# Patient Record
Sex: Female | Born: 1945
Health system: Southern US, Community
[De-identification: ages and names within clinical notes are randomized; demographics above are authoritative.]

## PROBLEM LIST (undated history)

## (undated) DIAGNOSIS — Z9221 Personal history of antineoplastic chemotherapy: Secondary | ICD-10-CM

## (undated) DIAGNOSIS — J849 Interstitial pulmonary disease, unspecified: Secondary | ICD-10-CM

## (undated) DIAGNOSIS — I219 Acute myocardial infarction, unspecified: Secondary | ICD-10-CM

## (undated) DIAGNOSIS — C50919 Malignant neoplasm of unspecified site of unspecified female breast: Secondary | ICD-10-CM

## (undated) DIAGNOSIS — G5600 Carpal tunnel syndrome, unspecified upper limb: Secondary | ICD-10-CM

## (undated) DIAGNOSIS — M069 Rheumatoid arthritis, unspecified: Secondary | ICD-10-CM

## (undated) DIAGNOSIS — F329 Major depressive disorder, single episode, unspecified: Secondary | ICD-10-CM

## (undated) DIAGNOSIS — F1090 Alcohol use, unspecified, uncomplicated: Secondary | ICD-10-CM

## (undated) DIAGNOSIS — M4800 Spinal stenosis, site unspecified: Secondary | ICD-10-CM

## (undated) DIAGNOSIS — Z923 Personal history of irradiation: Secondary | ICD-10-CM

## (undated) DIAGNOSIS — F32A Depression, unspecified: Secondary | ICD-10-CM

## (undated) DIAGNOSIS — H539 Unspecified visual disturbance: Secondary | ICD-10-CM

## (undated) DIAGNOSIS — F419 Anxiety disorder, unspecified: Secondary | ICD-10-CM

## (undated) DIAGNOSIS — I10 Essential (primary) hypertension: Secondary | ICD-10-CM

## (undated) DIAGNOSIS — Z7289 Other problems related to lifestyle: Secondary | ICD-10-CM

## (undated) HISTORY — DX: Acute myocardial infarction, unspecified: I21.9

## (undated) HISTORY — DX: Spinal stenosis, site unspecified: M48.00

## (undated) HISTORY — PX: CATARACT EXTRACTION: SUR2

## (undated) HISTORY — DX: Anxiety disorder, unspecified: F41.9

## (undated) HISTORY — DX: Interstitial pulmonary disease, unspecified: J84.9

## (undated) HISTORY — DX: Unspecified visual disturbance: H53.9

## (undated) HISTORY — DX: Essential (primary) hypertension: I10

## (undated) HISTORY — PX: CARPAL TUNNEL RELEASE: SHX101

## (undated) HISTORY — DX: Carpal tunnel syndrome, unspecified upper limb: G56.00

## (undated) HISTORY — PX: BREAST BIOPSY: SHX20

---

## 1964-11-18 HISTORY — PX: LAMINECTOMY: SHX219

## 1997-12-22 ENCOUNTER — Ambulatory Visit (HOSPITAL_COMMUNITY): Admission: RE | Admit: 1997-12-22 | Discharge: 1997-12-22 | Payer: Self-pay | Admitting: Family Medicine

## 1998-03-03 ENCOUNTER — Other Ambulatory Visit: Admission: RE | Admit: 1998-03-03 | Discharge: 1998-03-03 | Payer: Self-pay | Admitting: Family Medicine

## 1999-01-24 ENCOUNTER — Encounter: Payer: Self-pay | Admitting: Family Medicine

## 1999-01-24 ENCOUNTER — Ambulatory Visit (HOSPITAL_COMMUNITY): Admission: RE | Admit: 1999-01-24 | Discharge: 1999-01-24 | Payer: Self-pay | Admitting: Family Medicine

## 1999-04-02 ENCOUNTER — Other Ambulatory Visit: Admission: RE | Admit: 1999-04-02 | Discharge: 1999-04-02 | Payer: Self-pay | Admitting: Family Medicine

## 2000-04-23 ENCOUNTER — Ambulatory Visit (HOSPITAL_COMMUNITY): Admission: RE | Admit: 2000-04-23 | Discharge: 2000-04-23 | Payer: Self-pay | Admitting: Family Medicine

## 2000-04-23 ENCOUNTER — Encounter: Payer: Self-pay | Admitting: Family Medicine

## 2001-05-14 ENCOUNTER — Encounter: Payer: Self-pay | Admitting: Family Medicine

## 2001-05-14 ENCOUNTER — Ambulatory Visit (HOSPITAL_COMMUNITY): Admission: RE | Admit: 2001-05-14 | Discharge: 2001-05-14 | Payer: Self-pay | Admitting: Family Medicine

## 2001-05-28 ENCOUNTER — Encounter: Payer: Self-pay | Admitting: Family Medicine

## 2001-05-28 ENCOUNTER — Encounter: Admission: RE | Admit: 2001-05-28 | Discharge: 2001-05-28 | Payer: Self-pay | Admitting: Family Medicine

## 2002-06-01 ENCOUNTER — Encounter: Admission: RE | Admit: 2002-06-01 | Discharge: 2002-06-01 | Payer: Self-pay | Admitting: Family Medicine

## 2002-06-01 ENCOUNTER — Encounter: Payer: Self-pay | Admitting: Family Medicine

## 2002-11-05 ENCOUNTER — Ambulatory Visit (HOSPITAL_COMMUNITY): Admission: RE | Admit: 2002-11-05 | Discharge: 2002-11-05 | Payer: Self-pay | Admitting: Gastroenterology

## 2003-06-29 ENCOUNTER — Encounter: Admission: RE | Admit: 2003-06-29 | Discharge: 2003-06-29 | Payer: Self-pay | Admitting: Family Medicine

## 2003-06-29 ENCOUNTER — Encounter: Payer: Self-pay | Admitting: Family Medicine

## 2003-11-19 HISTORY — PX: OTHER SURGICAL HISTORY: SHX169

## 2003-11-19 HISTORY — PX: BREAST LUMPECTOMY: SHX2

## 2004-07-26 ENCOUNTER — Other Ambulatory Visit: Admission: RE | Admit: 2004-07-26 | Discharge: 2004-07-26 | Payer: Self-pay | Admitting: Family Medicine

## 2004-08-09 ENCOUNTER — Encounter: Admission: RE | Admit: 2004-08-09 | Discharge: 2004-08-09 | Payer: Self-pay | Admitting: Family Medicine

## 2004-08-30 ENCOUNTER — Encounter: Admission: RE | Admit: 2004-08-30 | Discharge: 2004-08-30 | Payer: Self-pay | Admitting: Family Medicine

## 2004-09-06 ENCOUNTER — Encounter (INDEPENDENT_AMBULATORY_CARE_PROVIDER_SITE_OTHER): Payer: Self-pay | Admitting: Radiology

## 2004-09-06 ENCOUNTER — Encounter: Admission: RE | Admit: 2004-09-06 | Discharge: 2004-09-06 | Payer: Self-pay | Admitting: Family Medicine

## 2004-09-06 ENCOUNTER — Encounter (INDEPENDENT_AMBULATORY_CARE_PROVIDER_SITE_OTHER): Payer: Self-pay | Admitting: Specialist

## 2004-09-14 ENCOUNTER — Encounter: Admission: RE | Admit: 2004-09-14 | Discharge: 2004-09-14 | Payer: Self-pay | Admitting: General Surgery

## 2004-09-20 ENCOUNTER — Encounter: Admission: RE | Admit: 2004-09-20 | Discharge: 2004-09-20 | Payer: Self-pay | Admitting: General Surgery

## 2004-09-20 ENCOUNTER — Encounter (INDEPENDENT_AMBULATORY_CARE_PROVIDER_SITE_OTHER): Payer: Self-pay | Admitting: Specialist

## 2004-10-01 ENCOUNTER — Encounter (INDEPENDENT_AMBULATORY_CARE_PROVIDER_SITE_OTHER): Payer: Self-pay | Admitting: *Deleted

## 2004-10-01 ENCOUNTER — Ambulatory Visit (HOSPITAL_COMMUNITY): Admission: RE | Admit: 2004-10-01 | Discharge: 2004-10-01 | Payer: Self-pay | Admitting: General Surgery

## 2004-10-01 ENCOUNTER — Encounter: Admission: RE | Admit: 2004-10-01 | Discharge: 2004-10-01 | Payer: Self-pay | Admitting: General Surgery

## 2004-10-01 ENCOUNTER — Ambulatory Visit (HOSPITAL_BASED_OUTPATIENT_CLINIC_OR_DEPARTMENT_OTHER): Admission: RE | Admit: 2004-10-01 | Discharge: 2004-10-01 | Payer: Self-pay | Admitting: General Surgery

## 2004-10-05 ENCOUNTER — Ambulatory Visit: Payer: Self-pay | Admitting: Oncology

## 2004-10-10 ENCOUNTER — Ambulatory Visit (HOSPITAL_COMMUNITY): Admission: RE | Admit: 2004-10-10 | Discharge: 2004-10-10 | Payer: Self-pay | Admitting: Oncology

## 2004-10-15 ENCOUNTER — Ambulatory Visit (HOSPITAL_COMMUNITY): Admission: RE | Admit: 2004-10-15 | Discharge: 2004-10-15 | Payer: Self-pay | Admitting: Oncology

## 2004-10-16 ENCOUNTER — Ambulatory Visit: Payer: Self-pay

## 2004-10-18 ENCOUNTER — Ambulatory Visit (HOSPITAL_COMMUNITY): Admission: RE | Admit: 2004-10-18 | Discharge: 2004-10-18 | Payer: Self-pay | Admitting: General Surgery

## 2004-10-22 ENCOUNTER — Ambulatory Visit (HOSPITAL_COMMUNITY): Admission: RE | Admit: 2004-10-22 | Discharge: 2004-10-22 | Payer: Self-pay | Admitting: Oncology

## 2004-11-16 ENCOUNTER — Inpatient Hospital Stay (HOSPITAL_COMMUNITY): Admission: EM | Admit: 2004-11-16 | Discharge: 2004-11-18 | Payer: Self-pay | Admitting: Oncology

## 2004-11-16 ENCOUNTER — Ambulatory Visit: Payer: Self-pay | Admitting: Oncology

## 2004-11-28 ENCOUNTER — Ambulatory Visit: Payer: Self-pay | Admitting: Oncology

## 2004-12-21 ENCOUNTER — Encounter: Payer: Self-pay | Admitting: Cardiology

## 2004-12-21 ENCOUNTER — Ambulatory Visit: Admission: RE | Admit: 2004-12-21 | Discharge: 2004-12-21 | Payer: Self-pay | Admitting: Oncology

## 2004-12-21 ENCOUNTER — Ambulatory Visit: Payer: Self-pay | Admitting: Cardiology

## 2005-01-14 ENCOUNTER — Ambulatory Visit: Payer: Self-pay | Admitting: Oncology

## 2005-01-15 ENCOUNTER — Ambulatory Visit: Payer: Self-pay | Admitting: Oncology

## 2005-03-05 ENCOUNTER — Ambulatory Visit: Payer: Self-pay | Admitting: Oncology

## 2005-03-05 ENCOUNTER — Ambulatory Visit: Admission: RE | Admit: 2005-03-05 | Discharge: 2005-04-05 | Payer: Self-pay | Admitting: Radiation Oncology

## 2005-03-21 ENCOUNTER — Encounter: Admission: RE | Admit: 2005-03-21 | Discharge: 2005-03-21 | Payer: Self-pay | Admitting: General Surgery

## 2005-04-04 ENCOUNTER — Encounter (INDEPENDENT_AMBULATORY_CARE_PROVIDER_SITE_OTHER): Payer: Self-pay | Admitting: *Deleted

## 2005-04-04 ENCOUNTER — Ambulatory Visit: Admission: RE | Admit: 2005-04-04 | Discharge: 2005-04-04 | Payer: Self-pay | Admitting: Oncology

## 2005-04-08 ENCOUNTER — Ambulatory Visit: Admission: RE | Admit: 2005-04-08 | Discharge: 2005-05-31 | Payer: Self-pay | Admitting: Radiation Oncology

## 2005-05-20 ENCOUNTER — Ambulatory Visit: Payer: Self-pay | Admitting: Oncology

## 2005-07-03 ENCOUNTER — Encounter: Payer: Self-pay | Admitting: Cardiology

## 2005-07-03 ENCOUNTER — Ambulatory Visit (HOSPITAL_COMMUNITY): Admission: RE | Admit: 2005-07-03 | Discharge: 2005-07-03 | Payer: Self-pay | Admitting: Oncology

## 2005-07-04 ENCOUNTER — Ambulatory Visit: Payer: Self-pay | Admitting: Cardiology

## 2005-07-08 ENCOUNTER — Ambulatory Visit: Payer: Self-pay | Admitting: Oncology

## 2005-07-09 ENCOUNTER — Encounter (INDEPENDENT_AMBULATORY_CARE_PROVIDER_SITE_OTHER): Payer: Self-pay | Admitting: Specialist

## 2005-07-09 ENCOUNTER — Encounter: Admission: RE | Admit: 2005-07-09 | Discharge: 2005-07-09 | Payer: Self-pay | Admitting: General Surgery

## 2005-09-04 ENCOUNTER — Ambulatory Visit: Payer: Self-pay | Admitting: Cardiovascular Disease

## 2005-09-04 ENCOUNTER — Encounter: Payer: Self-pay | Admitting: Cardiovascular Disease

## 2005-09-04 ENCOUNTER — Ambulatory Visit: Admission: RE | Admit: 2005-09-04 | Discharge: 2005-09-04 | Payer: Self-pay | Admitting: Oncology

## 2005-09-12 ENCOUNTER — Ambulatory Visit: Payer: Self-pay | Admitting: Oncology

## 2005-10-28 ENCOUNTER — Ambulatory Visit: Payer: Self-pay | Admitting: Oncology

## 2005-11-08 ENCOUNTER — Other Ambulatory Visit: Admission: RE | Admit: 2005-11-08 | Discharge: 2005-11-08 | Payer: Self-pay | Admitting: Family Medicine

## 2005-11-13 ENCOUNTER — Ambulatory Visit: Payer: Self-pay | Admitting: Cardiology

## 2005-11-13 ENCOUNTER — Ambulatory Visit: Admission: RE | Admit: 2005-11-13 | Discharge: 2005-11-13 | Payer: Self-pay | Admitting: Oncology

## 2005-11-13 ENCOUNTER — Encounter: Payer: Self-pay | Admitting: Cardiology

## 2005-12-30 ENCOUNTER — Ambulatory Visit: Payer: Self-pay | Admitting: Oncology

## 2006-02-25 ENCOUNTER — Encounter: Admission: RE | Admit: 2006-02-25 | Discharge: 2006-02-25 | Payer: Self-pay | Admitting: General Surgery

## 2006-02-26 ENCOUNTER — Ambulatory Visit: Admission: RE | Admit: 2006-02-26 | Discharge: 2006-02-26 | Payer: Self-pay | Admitting: Oncology

## 2006-02-26 ENCOUNTER — Encounter: Payer: Self-pay | Admitting: Cardiology

## 2006-02-26 ENCOUNTER — Ambulatory Visit: Payer: Self-pay | Admitting: Cardiology

## 2006-03-03 ENCOUNTER — Ambulatory Visit: Payer: Self-pay | Admitting: Oncology

## 2006-03-04 LAB — COMPREHENSIVE METABOLIC PANEL
ALT: 19 U/L (ref 0–40)
AST: 24 U/L (ref 0–37)
CO2: 27 mEq/L (ref 19–32)
Calcium: 8.9 mg/dL (ref 8.4–10.5)
Chloride: 103 mEq/L (ref 96–112)
Creatinine, Ser: 0.7 mg/dL (ref 0.4–1.2)
Sodium: 135 mEq/L (ref 135–145)
Total Protein: 6.9 g/dL (ref 6.0–8.3)

## 2006-03-04 LAB — CBC WITH DIFFERENTIAL/PLATELET
BASO%: 1.6 % (ref 0.0–2.0)
EOS%: 9 % — ABNORMAL HIGH (ref 0.0–7.0)
HCT: 39.5 % (ref 34.8–46.6)
MCH: 32 pg (ref 26.0–34.0)
MCHC: 36.2 g/dL — ABNORMAL HIGH (ref 32.0–36.0)
MONO#: 0.5 10*3/uL (ref 0.1–0.9)
NEUT%: 51 % (ref 39.6–76.8)
RBC: 4.47 10*6/uL (ref 3.70–5.32)
RDW: 11.8 % (ref 11.3–14.5)
WBC: 5.1 10*3/uL (ref 3.9–10.0)
lymph#: 1.4 10*3/uL (ref 0.9–3.3)

## 2006-04-15 ENCOUNTER — Ambulatory Visit: Payer: Self-pay | Admitting: Oncology

## 2006-05-20 ENCOUNTER — Ambulatory Visit: Payer: Self-pay | Admitting: Cardiology

## 2006-05-20 ENCOUNTER — Encounter: Payer: Self-pay | Admitting: Cardiology

## 2006-05-20 ENCOUNTER — Ambulatory Visit: Admission: RE | Admit: 2006-05-20 | Discharge: 2006-05-20 | Payer: Self-pay | Admitting: Oncology

## 2006-05-27 LAB — CBC WITH DIFFERENTIAL/PLATELET
BASO%: 0.6 % (ref 0.0–2.0)
Basophils Absolute: 0 10*3/uL (ref 0.0–0.1)
Eosinophils Absolute: 0.4 10*3/uL (ref 0.0–0.5)
HCT: 39.8 % (ref 34.8–46.6)
HGB: 13.9 g/dL (ref 11.6–15.9)
LYMPH%: 24.6 % (ref 14.0–48.0)
MCHC: 35 g/dL (ref 32.0–36.0)
MONO#: 0.3 10*3/uL (ref 0.1–0.9)
NEUT#: 2.6 10*3/uL (ref 1.5–6.5)
NEUT%: 58.1 % (ref 39.6–76.8)
Platelets: 224 10*3/uL (ref 145–400)
WBC: 4.5 10*3/uL (ref 3.9–10.0)
lymph#: 1.1 10*3/uL (ref 0.9–3.3)

## 2006-05-27 LAB — COMPREHENSIVE METABOLIC PANEL
ALT: 20 U/L (ref 0–40)
BUN: 13 mg/dL (ref 6–23)
CO2: 28 mEq/L (ref 19–32)
Calcium: 8.9 mg/dL (ref 8.4–10.5)
Chloride: 103 mEq/L (ref 96–112)
Creatinine, Ser: 0.7 mg/dL (ref 0.40–1.20)
Glucose, Bld: 118 mg/dL — ABNORMAL HIGH (ref 70–99)
Total Bilirubin: 0.4 mg/dL (ref 0.3–1.2)

## 2006-05-27 LAB — CANCER ANTIGEN 27.29: CA 27.29: 22 U/mL (ref 0–39)

## 2006-07-04 ENCOUNTER — Ambulatory Visit (HOSPITAL_BASED_OUTPATIENT_CLINIC_OR_DEPARTMENT_OTHER): Admission: RE | Admit: 2006-07-04 | Discharge: 2006-07-04 | Payer: Self-pay | Admitting: General Surgery

## 2006-07-29 ENCOUNTER — Encounter: Admission: RE | Admit: 2006-07-29 | Discharge: 2006-07-29 | Payer: Self-pay | Admitting: General Surgery

## 2006-07-29 ENCOUNTER — Encounter: Admission: RE | Admit: 2006-07-29 | Discharge: 2006-07-29 | Payer: Self-pay | Admitting: Obstetrics and Gynecology

## 2006-09-05 ENCOUNTER — Ambulatory Visit: Payer: Self-pay | Admitting: Oncology

## 2006-09-09 LAB — CBC WITH DIFFERENTIAL/PLATELET
BASO%: 0.6 % (ref 0.0–2.0)
Eosinophils Absolute: 0.6 10*3/uL — ABNORMAL HIGH (ref 0.0–0.5)
HCT: 40.2 % (ref 34.8–46.6)
LYMPH%: 20.4 % (ref 14.0–48.0)
MCHC: 35.1 g/dL (ref 32.0–36.0)
MCV: 90.7 fL (ref 81.0–101.0)
MONO#: 0.4 10*3/uL (ref 0.1–0.9)
MONO%: 6.8 % (ref 0.0–13.0)
NEUT%: 62.2 % (ref 39.6–76.8)
Platelets: 242 10*3/uL (ref 145–400)
RBC: 4.43 10*6/uL (ref 3.70–5.32)

## 2006-09-09 LAB — COMPREHENSIVE METABOLIC PANEL
ALT: 21 U/L (ref 0–40)
BUN: 16 mg/dL (ref 6–23)
CO2: 30 mEq/L (ref 19–32)
Calcium: 9.4 mg/dL (ref 8.4–10.5)
Chloride: 100 mEq/L (ref 96–112)
Creatinine, Ser: 0.93 mg/dL (ref 0.40–1.20)
Total Bilirubin: 0.4 mg/dL (ref 0.3–1.2)

## 2006-09-09 LAB — CANCER ANTIGEN 27.29: CA 27.29: 16 U/mL (ref 0–39)

## 2006-09-10 ENCOUNTER — Ambulatory Visit (HOSPITAL_COMMUNITY): Admission: RE | Admit: 2006-09-10 | Discharge: 2006-09-10 | Payer: Self-pay | Admitting: Oncology

## 2007-01-23 ENCOUNTER — Inpatient Hospital Stay (HOSPITAL_COMMUNITY): Admission: EM | Admit: 2007-01-23 | Discharge: 2007-01-27 | Payer: Self-pay | Admitting: Emergency Medicine

## 2007-05-06 ENCOUNTER — Other Ambulatory Visit: Admission: RE | Admit: 2007-05-06 | Discharge: 2007-05-06 | Payer: Self-pay | Admitting: Family Medicine

## 2007-07-17 ENCOUNTER — Encounter: Admission: RE | Admit: 2007-07-17 | Discharge: 2007-07-17 | Payer: Self-pay | Admitting: Family Medicine

## 2007-07-31 ENCOUNTER — Encounter: Admission: RE | Admit: 2007-07-31 | Discharge: 2007-07-31 | Payer: Self-pay | Admitting: Oncology

## 2007-09-18 ENCOUNTER — Ambulatory Visit: Payer: Self-pay | Admitting: Oncology

## 2007-09-22 LAB — CBC WITH DIFFERENTIAL/PLATELET
BASO%: 0.4 % (ref 0.0–2.0)
Basophils Absolute: 0 10*3/uL (ref 0.0–0.1)
EOS%: 13.1 % — ABNORMAL HIGH (ref 0.0–7.0)
HCT: 40.3 % (ref 34.8–46.6)
HGB: 14.3 g/dL (ref 11.6–15.9)
MCH: 31.7 pg (ref 26.0–34.0)
MCHC: 35.6 g/dL (ref 32.0–36.0)
MCV: 89 fL (ref 81.0–101.0)
MONO%: 7.6 % (ref 0.0–13.0)
NEUT%: 60 % (ref 39.6–76.8)

## 2007-09-22 LAB — COMPREHENSIVE METABOLIC PANEL
AST: 19 U/L (ref 0–37)
Alkaline Phosphatase: 61 U/L (ref 39–117)
BUN: 18 mg/dL (ref 6–23)
Creatinine, Ser: 0.73 mg/dL (ref 0.40–1.20)
Total Bilirubin: 0.6 mg/dL (ref 0.3–1.2)

## 2008-02-01 ENCOUNTER — Encounter: Admission: RE | Admit: 2008-02-01 | Discharge: 2008-02-01 | Payer: Self-pay | Admitting: Oncology

## 2008-03-08 ENCOUNTER — Encounter: Admission: RE | Admit: 2008-03-08 | Discharge: 2008-03-08 | Payer: Self-pay | Admitting: Family Medicine

## 2008-05-06 ENCOUNTER — Other Ambulatory Visit: Admission: RE | Admit: 2008-05-06 | Discharge: 2008-05-06 | Payer: Self-pay | Admitting: Family Medicine

## 2008-08-22 ENCOUNTER — Encounter: Admission: RE | Admit: 2008-08-22 | Discharge: 2008-08-22 | Payer: Self-pay | Admitting: General Surgery

## 2008-09-01 ENCOUNTER — Encounter: Admission: RE | Admit: 2008-09-01 | Discharge: 2008-09-01 | Payer: Self-pay | Admitting: General Surgery

## 2008-09-19 ENCOUNTER — Ambulatory Visit: Payer: Self-pay | Admitting: Oncology

## 2008-09-21 LAB — CBC WITH DIFFERENTIAL/PLATELET
Basophils Absolute: 0 10*3/uL (ref 0.0–0.1)
Eosinophils Absolute: 0.4 10*3/uL (ref 0.0–0.5)
HGB: 14.4 g/dL (ref 11.6–15.9)
LYMPH%: 19.2 % (ref 14.0–48.0)
MCV: 90.1 fL (ref 81.0–101.0)
MONO%: 7.9 % (ref 0.0–13.0)
NEUT#: 4.5 10*3/uL (ref 1.5–6.5)
Platelets: 238 10*3/uL (ref 145–400)

## 2008-09-21 LAB — COMPREHENSIVE METABOLIC PANEL
Alkaline Phosphatase: 63 U/L (ref 39–117)
BUN: 16 mg/dL (ref 6–23)
Glucose, Bld: 117 mg/dL — ABNORMAL HIGH (ref 70–99)
Total Bilirubin: 0.5 mg/dL (ref 0.3–1.2)

## 2009-05-08 ENCOUNTER — Other Ambulatory Visit: Admission: RE | Admit: 2009-05-08 | Discharge: 2009-05-08 | Payer: Self-pay | Admitting: Family Medicine

## 2009-08-29 ENCOUNTER — Encounter: Admission: RE | Admit: 2009-08-29 | Discharge: 2009-08-29 | Payer: Self-pay | Admitting: Oncology

## 2009-09-11 ENCOUNTER — Ambulatory Visit: Payer: Self-pay | Admitting: Oncology

## 2009-09-13 LAB — CBC WITH DIFFERENTIAL/PLATELET
BASO%: 0.7 % (ref 0.0–2.0)
Basophils Absolute: 0 10*3/uL (ref 0.0–0.1)
EOS%: 4.5 % (ref 0.0–7.0)
HGB: 14.1 g/dL (ref 11.6–15.9)
MCH: 31.4 pg (ref 25.1–34.0)
MCHC: 34.4 g/dL (ref 31.5–36.0)
MCV: 91.2 fL (ref 79.5–101.0)
MONO%: 6.9 % (ref 0.0–14.0)
RDW: 13.1 % (ref 11.2–14.5)

## 2009-09-13 LAB — COMPREHENSIVE METABOLIC PANEL
AST: 19 U/L (ref 0–37)
Albumin: 4.1 g/dL (ref 3.5–5.2)
Alkaline Phosphatase: 59 U/L (ref 39–117)
BUN: 19 mg/dL (ref 6–23)
Creatinine, Ser: 0.75 mg/dL (ref 0.40–1.20)
Potassium: 3.9 mEq/L (ref 3.5–5.3)

## 2010-01-29 ENCOUNTER — Encounter: Admission: RE | Admit: 2010-01-29 | Discharge: 2010-01-29 | Payer: Self-pay | Admitting: Family Medicine

## 2010-02-15 DIAGNOSIS — E559 Vitamin D deficiency, unspecified: Secondary | ICD-10-CM

## 2010-02-15 DIAGNOSIS — J45901 Unspecified asthma with (acute) exacerbation: Secondary | ICD-10-CM | POA: Insufficient documentation

## 2010-02-15 DIAGNOSIS — J45909 Unspecified asthma, uncomplicated: Secondary | ICD-10-CM | POA: Insufficient documentation

## 2010-02-15 DIAGNOSIS — F411 Generalized anxiety disorder: Secondary | ICD-10-CM | POA: Insufficient documentation

## 2010-02-15 DIAGNOSIS — E7800 Pure hypercholesterolemia, unspecified: Secondary | ICD-10-CM | POA: Insufficient documentation

## 2010-02-15 DIAGNOSIS — Z9010 Acquired absence of unspecified breast and nipple: Secondary | ICD-10-CM

## 2010-02-15 DIAGNOSIS — Z87898 Personal history of other specified conditions: Secondary | ICD-10-CM | POA: Insufficient documentation

## 2010-02-16 ENCOUNTER — Ambulatory Visit: Payer: Self-pay | Admitting: Internal Medicine

## 2010-02-16 DIAGNOSIS — Z853 Personal history of malignant neoplasm of breast: Secondary | ICD-10-CM

## 2010-02-22 ENCOUNTER — Telehealth (INDEPENDENT_AMBULATORY_CARE_PROVIDER_SITE_OTHER): Payer: Self-pay | Admitting: *Deleted

## 2010-03-28 ENCOUNTER — Ambulatory Visit: Payer: Self-pay | Admitting: Internal Medicine

## 2010-04-02 ENCOUNTER — Ambulatory Visit: Payer: Self-pay | Admitting: Internal Medicine

## 2010-05-18 ENCOUNTER — Ambulatory Visit: Payer: Self-pay | Admitting: Internal Medicine

## 2010-06-01 ENCOUNTER — Telehealth (INDEPENDENT_AMBULATORY_CARE_PROVIDER_SITE_OTHER): Payer: Self-pay | Admitting: *Deleted

## 2010-06-29 ENCOUNTER — Encounter: Admission: RE | Admit: 2010-06-29 | Discharge: 2010-06-29 | Payer: Self-pay | Admitting: Family Medicine

## 2010-08-29 ENCOUNTER — Ambulatory Visit: Payer: Self-pay | Admitting: Internal Medicine

## 2010-08-31 ENCOUNTER — Encounter: Admission: RE | Admit: 2010-08-31 | Discharge: 2010-08-31 | Payer: Self-pay | Admitting: General Surgery

## 2010-09-17 ENCOUNTER — Ambulatory Visit: Payer: Self-pay | Admitting: Oncology

## 2010-09-19 LAB — COMPREHENSIVE METABOLIC PANEL
ALT: 19 U/L (ref 0–35)
AST: 28 U/L (ref 0–37)
Albumin: 3.5 g/dL (ref 3.5–5.2)
Alkaline Phosphatase: 62 U/L (ref 39–117)
CO2: 29 mEq/L (ref 19–32)
Calcium: 8.8 mg/dL (ref 8.4–10.5)
Potassium: 4 mEq/L (ref 3.5–5.3)
Total Protein: 6.6 g/dL (ref 6.0–8.3)

## 2010-09-19 LAB — CBC WITH DIFFERENTIAL/PLATELET
EOS%: 9.5 % — ABNORMAL HIGH (ref 0.0–7.0)
MONO#: 0.5 10*3/uL (ref 0.1–0.9)
MONO%: 9.3 % (ref 0.0–14.0)
NEUT#: 2.9 10*3/uL (ref 1.5–6.5)
NEUT%: 57.7 % (ref 38.4–76.8)
RBC: 4.32 10*6/uL (ref 3.70–5.45)

## 2010-12-08 ENCOUNTER — Encounter: Payer: Self-pay | Admitting: Oncology

## 2010-12-18 NOTE — Assessment & Plan Note (Signed)
Summary: Pulmonary/ ext eval with pft review, better off ace   Copy to:  Dr. Shaune Pollack Primary Provider/Referring Provider:  Dr Shaune Pollack  CC:  Pt here for follow up visit. Pt states breathing is better since las OV. Pt c/o dry hacky cough starting this AM. Pt states she started back on Lotensin 3 days ago.  Marland Kitchen  History of Present Illness: 65 yowf quit smoking in 1995 with off and on wheezing x last sev years of smoking much better after quit but then required maintenance inhalers of some sort since around 2000.  February 16, 2010 cc did well until March  on symbicort maint now cc cough with congestion with slt yellow mucus with congestion in top of lungs worse after stirring and doesn't now disturb sleep. doe not back to baseline = exertion, not adls. No better with inhalers or singulair or even prednisone and sev different abx.   Apr 02, 2010 Pt here for follow up visit. Pt states breathing is better since las OV. Pt c/o dry hacky cough starting this AM. Pt states she started back on Lotensin 3 days ago.  Pt denies any significant sore throat, dysphagia, itching, sneezing,  nasal congestion or excess secretions,  fever, chills, sweats, unintended wt loss, pleuritic or exertional cp, hempoptysis, change in activity tolerance  orthopnea pnd or leg swelling Pt also denies any obvious fluctuation in symptoms with weather or environmental change or other alleviating or aggravating factors.       Current Medications (verified): 1)  Celexa 40 Mg Tabs (Citalopram Hydrobromide) .Marland Kitchen.. 1 Once Daily 2)  Allegra 180 Mg Tabs (Fexofenadine Hcl) .Marland Kitchen.. 1 Once Daily 3)  Prilosec 20 Mg Cpdr (Omeprazole) .... Take One 30-60 Min Before First and Last Meals of The Day 4)  Symbicort 160-4.5 Mcg/act Aero (Budesonide-Formoterol Fumarate) .... 2 Puffs Two Times A Day 5)  Clonazepam 0.5 Mg Tabs (Clonazepam) .Marland Kitchen.. 1 Once Daily As Needed 6)  Baby Aspirin 81 Mg Chew (Aspirin) .Marland Kitchen.. 1 Once Daily 7)  Calcium-Vitamin D  600-200 Mg-Unit Tabs (Calcium-Vitamin D) .Marland Kitchen.. 1 Two Times A Day 8)  Glucosamine 500 Mg Caps (Glucosamine Sulfate) .... 2 Once Daily 9)  Lotensin 10 Mg Tabs (Benazepril Hcl) .... Take 1 Tablet By Mouth Once A Day  Allergies (verified): No Known Drug Allergies  Past History:  Past Medical History: Hx of BREAST CANCER (ICD-174.9) MIGRAINES, HX OF (ICD-V13.8) MASTECTOMY, LEFT, HX OF (ICD-V45.71) ASTHMA (ICD-493.90)     - PFT's wnl Apr 02, 2010  VITAMIN D DEFICIENCY (ICD-268.9) ANXIETY (ICD-300.00) HYPERTENSION, BENIGN ESSENTIAL (ICD-401.1)     - Off ace February 16, 2010 due to ? pseudoasthma > cough recurred on lotensin Apr 02, 2010  HYPERCHOLESTEROLEMIA (ICD-272.0)  Vital Signs:  Patient profile:   65 year old female Height:      64 inches Weight:      143 pounds O2 Sat:      97 % on Room air Temp:     98.2 degrees F oral Pulse rate:   72 / minute BP sitting:   110 / 64  (right arm) Cuff size:   regular  Vitals Entered By: Zackery Barefoot CMA (Apr 02, 2010 10:07 AM)  O2 Flow:  Room air CC: Pt here for follow up visit. Pt states breathing is better since las OV. Pt c/o dry hacky cough starting this AM. Pt states she started back on Lotensin 3 days ago.   Comments Medications reviewed with patient Verified contact number and  pharmacy with patient Zackery Barefoot CMA  Apr 02, 2010 10:08 AM    Physical Exam  Additional Exam:  amb wf nad wt 144 February 17, 2010 > 143 Apr 02, 2010  HEENT mild turbinate edema.  Oropharynx no thrush or excess pnd or cobblestoning.  No JVD or cervical adenopathy. Mild accessory muscle hypertrophy. Trachea midline, nl thryroid. Chest was hyperinflated by percussion with diminished breath sounds and moderate increased exp time without wheeze. Hoover sign positive at mid inspiration. Regular rate and rhythm without murmur gallop or rub or increase P2 or edema.  Abd: no hsm, nl excursion. Ext warm without cyanosis or clubbing.     Impression &  Recommendations:  Problem # 1:  ASTHMA (ICD-493.90) All goals of asthma met including optimal function and elimination of symptoms with minimum need for rescue therapy. Contingencies discussed today including the rule of two's.   May be able to simply/ reduce meds if can eliminate all the upper airway features  Problem # 2:  HYPERTENSION, BENIGN ESSENTIAL (ICD-401.1)  The following medications were removed from the medication list:    Lotensin 10 Mg Tabs (Benazepril hcl) .Marland Kitchen... Take 1 tablet by mouth once a day Her updated medication list for this problem includes:    Micardis 40 Mg Tabs (Telmisartan) ..... One half daily    ACE inhibitors are problematic in  pts with airway complaints because  even experienced pulmonologists can't always distinguish ace effects from copd/asthma.  By themselves they don't actually cause a problem, much like oxygen can't by itself start a fire, but they certainly serve as a powerful catalyst or enhancer for any "fire"  or inflammatory process in the upper airway, be it caused by an ET  tube or more commonly reflux (especially in the obese or pts with known GERD or who are on biphoshonates).  In the era of ARB near equivalency until we have a better handle on the reversibility of the airway problem, it just makes sense to avoid ace entirely in the short run and then decide later, having established a level of airway control using a reasonable limited regimen, whether to add back ace but even then being very careful to observe the pt for worsening airway control and number of meds used/ needed to control symptoms.    Each maintenance medication was reviewed in detail including most importantly the difference between maintenance prns and under what circumstances the prns are to be used.  In addition, these two groups (for which the patient should keep up with refills) were distinguished from a third group :  meds that are used only short term with the intent to complete  a course of therapy and then not refill them.  The med list was then fully reconciled and reorganized to reflect this important distinction.   Medications Added to Medication List This Visit: 1)  Prilosec 20 Mg Cpdr (Omeprazole) .... Take  one 30-60 min before first meal of the day 2)  Symbicort 160-4.5 Mcg/act Aero (Budesonide-formoterol fumarate) .... 2 puffs first thing  in am and 2 puffs again in pm about 12 hours later 3)  Micardis 40 Mg Tabs (Telmisartan) .... One half daily 4)  Allegra 180 Mg Tabs (Fexofenadine hcl) .Marland Kitchen.. 1 once daily as needed  for itching sneezing runny nose 5)  Lotensin 10 Mg Tabs (Benazepril hcl) .... Take 1 tablet by mouth once a day  Patient Instructions: 1)  Think of your medications in 3 separate categories and keep them separate:  2)  a The ones you take no matter what daily on a scheduled basis 3)  b The ones you only take if needed for specific problems as needed 4)  c  The ones you take for a short course and stop, like antibiotics and prednisone. 5)  Micardis 40 mg one half daily 6)  Please schedule a follow-up appointment in 6 weeks, sooner if needed  7)      Immunization History:  Influenza Immunization History:    Influenza:  historical (08/21/2009)     Appended Document: Orders Update    Clinical Lists Changes  Orders: Added new Service order of Est. Patient Level IV (16109) - Signed

## 2010-12-18 NOTE — Assessment & Plan Note (Signed)
Summary: Pulmonary/  final summary f/u ov 100% better, hfa 75%   Copy to:  Dr. Shaune Pollack Primary Provider/Referring Provider:  Dr Shaune Pollack  CC:  Hoarsness much better since last seen.[name]  History of Present Illness: 65 yowf quit smoking in 1995 with off and on wheezing x last sev years of smoking much better after quit but then required maintenance inhalers of some sort since around 2000.  February 16, 2010 cc did well until March  on symbicort maint now cc cough with congestion with slt yellow mucus with congestion in top of lungs worse after stirring and doesn't now disturb sleep. doe not back to baseline = exertion, not adls. No better with inhalers or singulair or even prednisone and sev different abx.  rec 1)  stop singulair for now 2)  .prilosec Take one 30-60 min before first and last meals of the day  3)  Work on inhaler technique:  relax and blow all the way out then take a nice smooth deep breath back in, triggering the inhaler at same time you start breathing in 4)  stop lotensin and replace with benicar 20 mg one half daily   Apr 02, 2010 Pt here for follow up visit. Pt states breathing is better since las OV. Pt c/o dry hacky cough starting this AM. Pt states she started back on Lotensin 3 days ago.  rec stop lotensin Micardis 40 mg one half daily > cough resolved again  May 18, 2010 cc cough and SOB have resolved.  She only c/o hoarsness today.   August 29, 2010 ov cc Hoarsness much better since last seen, found breathing worse at lower dose symbicort so increased to 160 2 puffs first thing  in am and 2 puffs again in pm about 12 hours later  no limiting sob, no noct symptoms or need for rescue saba  Current Medications (verified): 1)  Celexa 40 Mg Tabs (Citalopram Hydrobromide) .[name].. 1 Once Daily 2)  Prilosec 20 Mg Cpdr (Omeprazole) .... Take  One 30-60 Min Before First Meal of The Day 3)  Baby Aspirin 81 Mg Chew (Aspirin) .[name].. 1 Once Daily 4)  Calcium-Vitamin D 600-200  Mg-Unit Tabs (Calcium-Vitamin D) .[name].. 1 Two Times A Day 5)  Glucosamine 500 Mg Caps (Glucosamine Sulfate) .... 2 Once Daily 6)  Micardis 40 Mg  Tabs (Telmisartan) .... One Half Daily **pls Send 15 Tablets/month Per Pt Request** 7)  Allegra 180 Mg Tabs (Fexofenadine Hcl) .[name].. 1 Once Daily As Needed  For Itching Sneezing Runny Nose 8)  Clonazepam 0.5 Mg Tabs (Clonazepam) .[name].. 1 Once Daily As Needed 9)  Symbicort 160-4.5 Mcg/act Aero (Budesonide-Formoterol Fumarate) .... 2 Puffs Every 12 Hours  Allergies (verified): No Known Drug Allergies  Past History:  Past Medical History: Hx of BREAST CANCER (ICD-174.9) MIGRAINES, HX OF (ICD-V13.8) MASTECTOMY, LEFT, HX OF (ICD-V45.71) ASTHMA (ICD-493.90)     - PFT's wnl Apr 02, 2010      - HFA 90% effective May 18, 2010 > 75% August 29, 2010  VITAMIN D DEFICIENCY (ICD-268.9) ANXIETY (ICD-300.00) HYPERTENSION, BENIGN ESSENTIAL (ICD-401.1)     - Off ace February 16, 2010 due to ? pseudoasthma > cough recurred on lotensin Apr 02, 2010 > resolved off HYPERCHOLESTEROLEMIA (ICD-272.0)  Vital Signs:  Patient profile:   65 year old female Weight:      149.13 pounds O2 Sat:      98 % on Room air Temp:     98.2 degrees F  oral Pulse rate:   80 / minute BP sitting:   110 / 60  (left arm)  Vitals Entered By: Vernie Murders (August 29, 2010 10:12 AM)  O2 Flow:  Room air  Physical Exam  Additional Exam:  amb wf nad wt 144 February 17, 2010 > 143 Apr 02, 2010 > 146 May 18, 2010 > 149 August 29, 2010  HEENT mild turbinate edema.  Oropharynx no thrush or excess pnd or cobblestoning.  No JVD or cervical adenopathy. Mild accessory muscle hypertrophy. Trachea midline, nl thryroid. Chest was hyperinflated by percussion with diminished breath sounds and moderate increased exp time without wheeze. Hoover sign positive at mid inspiration. Regular rate and rhythm without murmur gallop or rub or increase P2 or edema.  Abd: no hsm, nl excursion. Ext warm without  cyanosis or clubbing.     Impression & Recommendations:  Problem # 1:  ASTHMA (ICD-493.90) All goals of asthma met including optimal function and elimination of symptoms with minimum need for rescue therapy. Contingencies discussed today including the rule of two's.   I spent extra time with the patient today explaining optimal mdi  technique.  This improved from  50-75%  See instructions for specific recommendations   Medications Added to Medication List This Visit: 1)  Micardis 40 Mg Tabs (Telmisartan) .... One half daily 2)  Symbicort 160-4.5 Mcg/act Aero (Budesonide-formoterol fumarate) .... 2 puffs every 12 hours  Other Orders: Flu Vaccine 27yrs + MEDICARE PATIENTS (Z6109) Administration Flu vaccine - MCR (G0008) Est. Patient Level III (60454) HFA Instruction [phone])  Patient Instructions: 1)  Work on perfecting  inhaler technique:  relax and blow all the way out then take a nice smooth deep breath back in, triggering the inhaler at same time you start breathing in then rinse and gargle 2)  Dr Kevan Ny can refill your medications if you are satisfied with your breathing - if not satisfied 100% return here anytime! Prescriptions: MICARDIS 40 MG  TABS (TELMISARTAN) one half daily  #15 x 11   Entered and Authorized by:   Nyoka Cowden MD   Signed by:   Nyoka Cowden MD on 08/29/2010   Method used:   Historical   RxID:   [id number]     Flu Vaccine Consent Questions     Do you have a history of severe allergic reactions to this vaccine? no    Any prior history of allergic reactions to egg and/or gelatin? no    Do you have a sensitivity to the preservative Thimersol? no    Do you have a past history of Guillan-Barre Syndrome? no    Do you currently have an acute febrile illness? no    Have you ever had a severe reaction to latex? no    Vaccine information given and explained to patient? yes    Are you currently pregnant? no    Lot Number:[id number]   Exp  Date:05/18/2011   Manufacturer: [employer]    Site Given  Left Deltoid IMdflu Vernie Murders  August 29, 2010 11:17 AM

## 2010-12-18 NOTE — Assessment & Plan Note (Signed)
Summary: Pulmonary new pt eval/  copd vs ace effects   Visit Type:  Initial Consult Copy to:  Dr. Shaune Pollack Primary Provider/Referring Provider:  Dr Shaune Pollack  CC:  Asthma.  History of Present Illness: 53 yowf quit smoking in 1995 with off and on wheezing x last sev years of smoking much better after quit but then required maintenance inhalers of some sort since around 2000.  February 16, 2010 cc did well until March  on symbicort maint now cc cough with congestion with slt yellow mucus with congestion in top of lungs worse after stirring and doesn't now disturb sleep. doe not back to baseline = exertion, not adls. No better with inhalers or singulair or even prednisone.    Presently  denies any significant sore throat, dysphagia, itching, sneezing,  nasal congestion or excess secretions,  fever, chills, sweats, unintended wt loss, pleuritic or exertional cp, hempoptysis, change in activity tolerance  orthopnea pnd or leg swelling Pt also denies any obvious fluctuation in symptoms with weather or environmental change or other alleviating or aggravating factors.       Current Medications (verified): 1)  Lotensin 10 Mg Tabs (Benazepril Hcl) .Marland Kitchen.. 1 Once Daily 2)  Celexa 40 Mg Tabs (Citalopram Hydrobromide) .Marland Kitchen.. 1 Once Daily 3)  Allegra 180 Mg Tabs (Fexofenadine Hcl) .Marland Kitchen.. 1 Once Daily 4)  Prilosec 20 Mg Cpdr (Omeprazole) .Marland Kitchen.. 1 Once Daily 5)  Symbicort 160-4.5 Mcg/act Aero (Budesonide-Formoterol Fumarate) .... 2 Puffs Two Times A Day 6)  Clonazepam 0.5 Mg Tabs (Clonazepam) .Marland Kitchen.. 1 Once Daily As Needed 7)  Baby Aspirin 81 Mg Chew (Aspirin) .Marland Kitchen.. 1 Once Daily 8)  Calcium-Vitamin D 600-200 Mg-Unit Tabs (Calcium-Vitamin D) .Marland Kitchen.. 1 Two Times A Day 9)  Glucosamine 500 Mg Caps (Glucosamine Sulfate) .... 2 Once Daily 10)  Singulair 10 Mg Tabs (Montelukast Sodium) .Marland Kitchen.. 1 Once Daily  Allergies (verified): No Known Drug Allergies  Past History:  Past Medical History: Hx of BREAST CANCER  (ICD-174.9) MIGRAINES, HX OF (ICD-V13.8) MASTECTOMY, LEFT, HX OF (ICD-V45.71) ASTHMA (ICD-493.90) VITAMIN D DEFICIENCY (ICD-268.9) ANXIETY (ICD-300.00) HYPERTENSION, BENIGN ESSENTIAL (ICD-401.1)     - Off ace February 16, 2010 due to ? pseudoasthma HYPERCHOLESTEROLEMIA (ICD-272.0)  Past Surgical History: Left partial mastectomy 2005 Laminectomy 1966  Family History: Emphysema- PGF Asthma- PGF and Father Heart dz- Mother Stomach CA- Father  Social History: Former smoker. Quit in 1995.  Smoked for 30 yrs up to 1 1/2 ppd ETOH daily- 2-3 beers [employer] Married Children  Review of Systems       The patient complains of shortness of breath with activity, productive cough, sore throat, nasal congestion/difficulty breathing through nose, anxiety, and change in color of mucus.  The patient denies shortness of breath at rest, non-productive cough, coughing up blood, chest pain, irregular heartbeats, acid heartburn, indigestion, loss of appetite, weight change, abdominal pain, difficulty swallowing, tooth/dental problems, headaches, sneezing, itching, ear ache, depression, hand/feet swelling, joint stiffness or pain, rash, and fever.    Vital Signs:  Patient profile:   65 year old female Height:      64 inches Weight:      144 pounds BMI:     24.81 O2 Sat:      95 % on Room air Temp:     98.1 degrees F oral Pulse rate:   78 / minute BP sitting:   120 / 60  (right arm)  Vitals Entered By: Vernie Murders (February 16, 2010 11:12 AM)  O2 Flow:  Room air  Physical Exam  Additional Exam:  amb hoarse wf nad with classic voice fatigue and pseudowheeze resolves with purse lip maneuver wt 144 February 17, 2010  HEENT mild turbinate edema.  Oropharynx no thrush or excess pnd or cobblestoning.  No JVD or cervical adenopathy. Mild accessory muscle hypertrophy. Trachea midline, nl thryroid. Chest was hyperinflated by percussion with diminished breath sounds and moderate increased exp time without  wheeze. Hoover sign positive at mid inspiration. Regular rate and rhythm without murmur gallop or rub or increase P2 or edema.  Abd: no hsm, nl excursion. Ext warm without cyanosis or clubbing.     Impression & Recommendations:  Problem # 1:  ASTHMA (ICD-493.90) When respiratory symptoms flare  well after a patient reports complete smoking cessation,  it is very hard to "blame" COPD or asthma  ie it doesn't make any more sense than hearing a  NASCAR driver wrecked his car while driving his kids to school or a Careers adviser sliced his hand off carving [place].  Once the high risk activity stops,  the symptoms should not suddenly erupt.  If so, the differential diagnosis should include    LPR/Reflux (which she reports at baselline) , CHF, or side effect of medications, especially ace inhibitors.  Try off then return for pft's  Since symptoms are refractory to singulair will also try off this, the reverse of a therapeutic trial.  Problem # 2:  HYPERTENSION, BENIGN ESSENTIAL (ICD-401.1)  The following medications were removed from the medication list:    Lotensin 10 Mg Tabs (Benazepril hcl) .Marland Kitchen... 1 once daily Her updated medication list for this problem includes:    Benicar 20 Mg Tabs (Olmesartan medoxomil) ..... One half daily   ACE inhibitors are problematic in  pts with airway complaints because  even experienced pulmonologists can't always distinguish ace effects from copd/asthma.  By themselves they don't actually cause a problem, much like oxygen can't by itself start a fire, but they certainly serve as a powerful catalyst or enhancer for any "fire"  or inflammatory process in the upper airway, be it caused by an ET  tube or more commonly reflux (especially in the obese or pts with known GERD or who are on biphoshonates).  In the era of ARB near equivalency until we have a better handle on the reversibility of the airway problem, it just makes sense to avoid ace entirely in the short run and then decide  later, having established a level of airway control using a reasonable limited regimen, whether to add back ace but even then being very careful to observe the pt for worsening airway control and number of meds used/ needed to control symptoms.    Orders: New Patient Level V (56433)  Medications Added to Medication List This Visit: 1)  Lotensin 10 Mg Tabs (Benazepril hcl) .Marland Kitchen.. 1 once daily 2)  Celexa 40 Mg Tabs (Citalopram hydrobromide) .Marland Kitchen.. 1 once daily 3)  Allegra 180 Mg Tabs (Fexofenadine hcl) .Marland Kitchen.. 1 once daily 4)  Prilosec 20 Mg Cpdr (Omeprazole) .Marland Kitchen.. 1 once daily 5)  Prilosec 20 Mg Cpdr (Omeprazole) .... Take one 30-60 min before first and last meals of the day 6)  Symbicort 160-4.5 Mcg/act Aero (Budesonide-formoterol fumarate) .... 2 puffs two times a day 7)  Clonazepam 0.5 Mg Tabs (Clonazepam) .Marland Kitchen.. 1 once daily as needed 8)  Baby Aspirin 81 Mg Chew (Aspirin) .Marland Kitchen.. 1 once daily 9)  Calcium-vitamin D 600-200 Mg-unit Tabs (Calcium-vitamin d) .Marland Kitchen.. 1 two times a  day 10)  Glucosamine 500 Mg Caps (Glucosamine sulfate) .... 2 once daily 11)  Singulair 10 Mg Tabs (Montelukast sodium) .Marland Kitchen.. 1 once daily 12)  Benicar 20 Mg Tabs (Olmesartan medoxomil) .... One half daily  Patient Instructions: 1)  stop singulair for now 2)  .prilosec Take one 30-60 min before first and last meals of the day  3)  Work on inhaler technique:  relax and blow all the way out then take a nice smooth deep breath back in, triggering the inhaler at same time you start breathing in 4)  stop lotensin and replace with benicar 20 mg one half daily 5)  Please schedule a follow-up appointment in 4 weeks, sooner if needed with PFTs 6)  GERD (REFLUX)  is a common cause of respiratory symptoms. It commonly presents without heartburn and can be treated with medication, but also with lifestyle changes including avoidance of late meals, excessive alcohol, smoking cessation, and avoid fatty foods, chocolate, peppermint, colas, red wine,  and acidic juices such as orange juice. NO MINT OR MENTHOL PRODUCTS SO NO COUGH DROPS  7)  USE SUGARLESS CANDY INSTEAD (jolley ranchers)  8)  NO OIL BASED VITAMINS

## 2010-12-18 NOTE — Assessment & Plan Note (Signed)
Summary: Pulmonary/ ext summary f/u ov, try lower dose sym   Copy to:  Dr. Shaune Pollack Primary Provider/Referring Provider:  Dr Shaune Pollack  CC:  6 wk followup.  Pt states that her cough and SOB have resolved.  She only c/o hoarsness today.[name]  History of Present Illness: 65 yowf quit smoking in 1995 with off and on wheezing x last sev years of smoking much better after quit but then required maintenance inhalers of some sort since around 2000.  February 16, 2010 cc did well until March  on symbicort maint now cc cough with congestion with slt yellow mucus with congestion in top of lungs worse after stirring and doesn't now disturb sleep. doe not back to baseline = exertion, not adls. No better with inhalers or singulair or even prednisone and sev different abx.  rec 1)  stop singulair for now 2)  .prilosec Take one 30-60 min before first and last meals of the day  3)  Work on inhaler technique:  relax and blow all the way out then take a nice smooth deep breath back in, triggering the inhaler at same time you start breathing in 4)  stop lotensin and replace with benicar 20 mg one half daily   Apr 02, 2010 Pt here for follow up visit. Pt states breathing is better since las OV. Pt c/o dry hacky cough starting this AM. Pt states she started back on Lotensin 3 days ago.  rec stop lotensin Micardis 40 mg one half daily > cough resolved again  May 18, 2010 cc cough and SOB have resolved.  She only c/o hoarsness today.  Pt denies any significant sore throat, dysphagia, itching, sneezing,  nasal congestion or excess secretions,  fever, chills, sweats, unintended wt loss, pleuritic or exertional cp, hempoptysis, change in activity tolerance  orthopnea pnd or leg swelling Pt also denies any obvious fluctuation in symptoms with weather or environmental change or other alleviating or aggravating factors.       Current Medications (verified): 1)  Celexa 40 Mg Tabs (Citalopram Hydrobromide) .[name].. 1 Once  Daily 2)  Prilosec 20 Mg Cpdr (Omeprazole) .... Take  One 30-60 Min Before First Meal of The Day 3)  Symbicort 160-4.5 Mcg/act Aero (Budesonide-Formoterol Fumarate) .... 2 Puffs First Thing  in Am and 2 Puffs Again in Pm About 12 Hours Later 4)  Baby Aspirin 81 Mg Chew (Aspirin) .[name].. 1 Once Daily 5)  Calcium-Vitamin D 600-200 Mg-Unit Tabs (Calcium-Vitamin D) .[name].. 1 Two Times A Day 6)  Glucosamine 500 Mg Caps (Glucosamine Sulfate) .... 2 Once Daily 7)  Micardis 40 Mg  Tabs (Telmisartan) .... One Half Daily 8)  Allegra 180 Mg Tabs (Fexofenadine Hcl) .[name].. 1 Once Daily As Needed  For Itching Sneezing Runny Nose 9)  Clonazepam 0.5 Mg Tabs (Clonazepam) .[name].. 1 Once Daily As Needed  Allergies (verified): No Known Drug Allergies  Past History:  Past Medical History: Hx of BREAST CANCER (ICD-174.9) MIGRAINES, HX OF (ICD-V13.8) MASTECTOMY, LEFT, HX OF (ICD-V45.71) ASTHMA (ICD-493.90)     - PFT's wnl Apr 02, 2010      - HFA 90% effective May 18, 2010  VITAMIN D DEFICIENCY (ICD-268.9) ANXIETY (ICD-300.00) HYPERTENSION, BENIGN ESSENTIAL (ICD-401.1)     - Off ace February 16, 2010 due to ? pseudoasthma > cough recurred on lotensin Apr 02, 2010 > resolved off HYPERCHOLESTEROLEMIA (ICD-272.0)  Vital Signs:  Patient profile:   65 year old female Weight:      146  pounds O2 Sat:      97 % on Room air Temp:     98.8 degrees F oral Pulse rate:   78 / minute BP sitting:   118 / 58  (left arm)  Vitals Entered By: Vernie Murders (May 18, 2010 10:45 AM)  O2 Flow:  Room air  Physical Exam  Additional Exam:  amb wf nad wt 144 February 17, 2010 > 143 Apr 02, 2010 > 146 May 18, 2010  HEENT mild turbinate edema.  Oropharynx no thrush or excess pnd or cobblestoning.  No JVD or cervical adenopathy. Mild accessory muscle hypertrophy. Trachea midline, nl thryroid. Chest was hyperinflated by percussion with diminished breath sounds and moderate increased exp time without wheeze. Hoover sign positive at mid  inspiration. Regular rate and rhythm without murmur gallop or rub or increase P2 or edema.  Abd: no hsm, nl excursion. Ext warm without cyanosis or clubbing.     Impression & Recommendations:  Problem # 1:  ASTHMA (ICD-493.90) Doing great on symbicort 160 with good hfa technique docomented so try symbicort 80 to reduce cost and hoarsness with the other option of trying qvar if she does well on symbicort 80 because this is asthma, not copd  I spent extra time with the patient today explaining optimal mdi  technique.  This improved from  75-90% effective  Problem # 2:  HYPERTENSION, BENIGN ESSENTIAL (ICD-401.1)  Her updated medication list for this problem includes:    Micardis 40 Mg Tabs (Telmisartan) ..... One half daily  Clearly by challenge/ rechallenge she is ace intolerant.  ACE inhibitors were probably fueling this problem because they inhibit the metabolism of Upper Air Bradykinin,  a potent mediator of upper airways inflammation. If they are the "fuel" then something else usually is the "spark" (eg viral uri, post nasal drainage, or reflux). In the era of ARB equivalency,  I would avoid this class entirely.  Orders: Est. Patient Level III (16109)  Medications Added to Medication List This Visit: 1)  Symbicort 80-4.5 Mcg/act Aero (Budesonide-formoterol fumarate) .... 2 puffs first thing  in am and 2 puffs again in pm about 12 hours later  Other Orders: HFA Instruction [phone])  Patient Instructions: 1)  Continue micardis 40 one half daily 2)  Reduce symbicort to 80 2 puffs first thing  in am and 2 puffs again in pm about 12 hours later and supplement if needed with proaire but the goal is less than twice weekly 3)  Return to office in 3 months, sooner if needed  Prescriptions: SYMBICORT 80-4.5 MCG/ACT  AERO (BUDESONIDE-FORMOTEROL FUMARATE) 2 puffs first thing  in am and 2 puffs again in pm about 12 hours later  #1 x 11   Entered and Authorized by:   Nyoka Cowden MD   Signed  by:   Nyoka Cowden MD on 05/18/2010   Method used:   Electronically to        [facility]. [phone]* (retail)       [phone]       [place], [place]  [address]       Ph: [phone]       Fax: [phone]   RxID:   [id number] MICARDIS 40 MG  TABS (TELMISARTAN) one half daily  #30 x 11   Entered and Authorized by:   Nyoka Cowden MD   Signed by:   Nyoka Cowden MD on  05/18/2010   Method used:   Electronically to        [facility]. [phone]* (retail)       [phone]       [place], [place]  [address]       Ph: [phone]       Fax: [phone]   RxID:   [id number]

## 2010-12-18 NOTE — Progress Notes (Signed)
Summary: Prefer stop allegra and try chlortrimeton  Phone Note Call from Patient Call back at Home Phone [phone] Call back at [phone]   Caller: Patient Call For: wert Summary of Call: Has a question about med micardis 40mg , also wants to know if it's ok for her to take benadryl. Initial call taken by: Darletta Moll,  June 01, 2010 9:50 AM  Follow-up for Phone Call        called and spoke with pt and she stated that the rx for the micardis 40mg  that was sent in the pharmacy charged her for 3 months since it was #30  requested that rx be resent for #15  1/2 tab daily..this has been done.  she also wanted to know if its ok for her to take benadryl?  didnt want to take it without asking first.  please advise.  thanks Randell Loop CMA  June 01, 2010 9:59 AM  ok to use benadryl but already on allegra so prefer stop allegra and try chlortrimeton 4 mg otc every 6 hours as needed Follow-up by: Nyoka Cowden MD,  June 01, 2010 12:15 PM  Additional Follow-up for Phone Call Additional follow up Details #1::        Called, spoke with pt. Pt informed of above recs per MW.  She verbalized understanding.  She also requested for only 15 tablets/month of micardis be sent when she needs refills.  Pt wanted this info to be put on her file-does not need rxs at this time.  Info placed in pt's med list.  Gweneth Dimitri RN  June 01, 2010 12:29 PM     New/Updated Medications: MICARDIS 40 MG  TABS (TELMISARTAN) one half daily **Pls send 15 tablets/month per pt request**

## 2010-12-18 NOTE — Progress Notes (Signed)
Summary: new rx  Phone Note Call from Patient Call back at [phone]   Caller: Patient Call For: wert Reason for Call: Talk to Nurse Summary of Call: Need new rx for her prilosec - MW upped to 2 per day, and her RX has run out. [facility] Initial call taken by: Eugene Gavia,  February 22, 2010 10:21 AM  Follow-up for Phone Call        Pt informed that rx was sent to pharmacy. Abigail Miyamoto RN  February 22, 2010 10:28 AM     Prescriptions: PRILOSEC 20 MG CPDR (OMEPRAZOLE) Take one 30-60 min before first and last meals of the day  #60 x 6   Entered by:   Abigail Miyamoto RN   Authorized by:   Nyoka Cowden MD   Signed by:   Abigail Miyamoto RN on 02/22/2010   Method used:   Electronically to        [facility]. [phone]* (retail)       [phone]       [place], [place]  [address]       Ph: [phone]       Fax: [phone]   RxID:   [id number]

## 2010-12-18 NOTE — Miscellaneous (Signed)
Summary: Orders Update pft charges  Clinical Lists Changes  Orders: Added new Service order of Carbon Monoxide diffusing w/capacity (94720) - Signed Added new Service order of Lung Volumes (94240) - Signed Added new Service order of Spirometry (Pre & Post) (94060) - Signed 

## 2011-02-20 ENCOUNTER — Other Ambulatory Visit: Payer: Self-pay | Admitting: Internal Medicine

## 2011-04-05 NOTE — H&P (Signed)
NAMECORTNEY, [name]            ACCOUNT NO.:  [id number]   MEDICAL RECORD NO.:  [id number]          PATIENT TYPE:  INP   LOCATION:  0260                         FACILITY:  [facility]   PHYSICIAN:  Leighton Roach. Truett Perna, M.D. DATE OF BIRTH:  1946-07-08   DATE OF ADMISSION:  11/16/2004  DATE OF DISCHARGE:                                HISTORY & PHYSICAL   CHIEF COMPLAINT:  Fever and chills.   HISTORY OF PRESENT ILLNESS:  [name] is a 65 year old woman who was  diagnosed with a multifocal infiltrating ductal carcinoma of the left breast  in November, 2005.  She underwent a lumpectomy and axillary lymph node  dissection on October 01, 2004 with pathology showing a multifocal invasive  mammary carcinoma with the largest focus measuring 1.5 cm, grade 3,  extensive lymphovascular invasion, 6 of 15 lymph nodes positive for  metastatic carcinoma, ER/PR negative.  HER-2/neu 3+ positive.  She began  treatment with dose-dense AC on October 31, 2004.  To date, she has  completed two cycles with the most recent given on November 14, 2004  followed by a Neulasta injection on November 15, 2004.   [name] is seen in the office today secondary to onset of a fever and  chills earlier this afternoon.  She reports a temperature of 101 degrees  with associated shaking chills at about 1 p.m. today.  She also has  developed a cough.  Her oral intake has been poor.  She had an episode of  emesis on the way to the office.  She has had two loose stools today.  She  denies any urinary symptoms.  She denies any pain or redness associated with  the Port-A-Cath.   PAST MEDICAL HISTORY:  1.  Breast cancer, as above.  2.  Osteoarthritis.  3.  Hypertension.  4.  Asthma.  5.  History of depression.  6.  Seasonal allergies.   CURRENT MEDICATIONS:  1.  Allegra 180 mg daily.  2.  Celexa 40 mg daily.  3.  Coumadin 1 mg daily.  4.  Lotensin 10 mg daily.  5.  Calcium plus D.  6.  Advair Diskus twice  daily.   ALLERGIES:  No known drug allergies.   FAMILY HISTORY:  Father deceased with questionable colon cancer.  Mother is  19.  There is no family history of breast cancer.   SOCIAL HISTORY:  [name] lives in [place].  She is married.  She has  three children.  She quit smoking in 1995.  There is no history of ETOH use.   REVIEW OF SYSTEMS:  Per HPI.   PHYSICAL EXAMINATION:  VITAL SIGNS:  Temperature 100.5, heart rate 110,  respirations 18, blood pressure 110/73.  GENERAL:  A pleasant Caucasian female in no acute distress.  HEENT:  Normocephalic and atraumatic.  Pupils are equal, round and reactive  to light.  Extraocular movements are intact.  Sclerae are anicteric.  Oropharynx is without thrush or ulceration.  CHEST:  Lungs clear bilaterally.  No wheezes or rales.  Port-A-Cath is  nontender.  Mild erythema over the port site and upper  chest.  CARDIOVASCULAR:  Regular rate and rhythm.  ABDOMEN:  Soft and nontender.  No hepatosplenomegaly.  EXTREMITIES:  No clubbing, cyanosis or edema.  NEUROLOGIC:  Alert and oriented.  Moves all extremities.  Motor strength is  5/5.   LABORATORY DATA:  On November 14, 2004, hemoglobin 13.8, white count 11.7,  absolute neutrophil count 9.0, platelet count 463,000.   IMPRESSION:  1.  Fever/chills.  2.  Breast cancer, status post cycle 2 AC day #3.  3.  Nausea/vomiting/diarrhea.  4.  Hypertension.  5.  Asthma.  6.  Depression.   PLAN:  [name] likely has a viral syndrome.  We will admit her for  observation, cultures, IV antibiotics, and clinical followup of the Port-A-  Cath site.   Patient interviewed and examined by Dr. Truett Perna.  Plan reviewed.      LT/MEDQ  D:  11/16/2004  T:  11/16/2004  Job:  [id number]   cc:   Valentino Hue. Magrinat, M.D.  [address] [facility]  [place]  [place] [address]  Fax: [phone]   Angelia Mould. Derrell Lolling, M.D.  [address]  [place]  [place] [address]  Fax: [phone]

## 2011-04-05 NOTE — Op Note (Signed)
[name], [name]            ACCOUNT NO.:  [id number]   MEDICAL RECORD NO.:  [id number]          PATIENT TYPE:  AMB   LOCATION:  DSC                          FACILITY:  [facility]   PHYSICIAN:  Angelia Mould. Derrell Lolling, M.D.DATE OF BIRTH:  [birth year]   DATE OF PROCEDURE:  07/04/2006  DATE OF DISCHARGE:                                 OPERATIVE REPORT   PREOPERATIVE DIAGNOSIS:  Breast cancer.   POSTOPERATIVE DIAGNOSIS:  Breast cancer.   OPERATION PERFORMED:  Removal of Port-A-Cath.   SURGEON:  Angelia Mould. Derrell Lolling, M.D.   OPERATIVE INDICATIONS:  This is a 65 year old white female who had invasive  carcinoma of the left breast with positive lymph nodes.  She has had  adjuvant chemotherapy and radiation therapy and Herceptin and she has been  sent back to me by Dr. Darnelle Catalan to have the Port-A-Cath removed.  She has  been counseled regarding this and is brought to the minor procedure room at  [facility] electively.   OPERATIVE TECHNIQUE:  The patient was placed supine on the operating table.  The right neck and chest were prepped and draped in a sterile fashion.  1%  Xylocaine with epinephrine was used as a local infiltration anesthetic.  A  transverse incision was made through the previous scar.  Dissection was  carried down into the deep subcutaneous tissue.  I dissected the port out  away from the capsule.  I divided the sutures holding it in place and then  removed the port and the catheter intact.  There was no bleeding.  The  subcutaneous tissue was closed with interrupted suture of 3-0 Vicryl and the  skin closed with a running subcuticular suture of 4-0 Monocryl and Steri-  Strips.  Clean bandages were placed and the patient taken to the recovery  room in stable condition.  Estimated blood loss was about 5 mL.  Complications none. Sponge, needle and instrument counts were correct.      Angelia Mould. Derrell Lolling, M.D.  Electronically Signed     HMI/MEDQ  D:  07/04/2006  T:   07/04/2006  Job:  [id number]
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
## 2011-04-09 ENCOUNTER — Other Ambulatory Visit: Payer: Self-pay | Admitting: Internal Medicine

## 2011-04-25 ENCOUNTER — Encounter (INDEPENDENT_AMBULATORY_CARE_PROVIDER_SITE_OTHER): Payer: Self-pay | Admitting: General Surgery

## 2011-06-18 ENCOUNTER — Encounter: Payer: Self-pay | Admitting: Podiatry

## 2011-06-18 DIAGNOSIS — M898X9 Other specified disorders of bone, unspecified site: Secondary | ICD-10-CM

## 2011-06-19 ENCOUNTER — Other Ambulatory Visit: Payer: Self-pay | Admitting: Internal Medicine

## 2011-06-21 ENCOUNTER — Telehealth: Payer: Self-pay | Admitting: Internal Medicine

## 2011-06-21 MED ORDER — TELMISARTAN 20 MG PO TABS: 20.0000 mg | ORAL_TABLET | Freq: Every day | ORAL | Status: DC

## 2011-06-21 NOTE — Telephone Encounter (Signed)
Called and spoke with pt and she stated that she will speak with Dr. Kevan Ny next week at her follow up for refills of the micardis.  Dr. Kevan Ny is out of the office until next week and pt is needing refill of the micardis.  Sent in to the pharmacy for #30- with no refills. Pt is aware.

## 2011-06-21 NOTE — Telephone Encounter (Signed)
Patient returning call.

## 2011-06-21 NOTE — Telephone Encounter (Signed)
lmomtcb---looks like MW told pt that Dr. Kevan Ny can fill her meds unless her breathing got worse to come back.  Pt was given rx for micardis x 11 refills in 08/2010.

## 2011-06-21 NOTE — Telephone Encounter (Signed)
Spoke with pt and advised her she was giving 11 refills 10/11. Pt states she must have gotten her rx numbers mixed up and will call her phamacy back with the correct rx #

## 2011-07-09 ENCOUNTER — Other Ambulatory Visit (INDEPENDENT_AMBULATORY_CARE_PROVIDER_SITE_OTHER): Payer: Self-pay | Admitting: General Surgery

## 2011-07-09 DIAGNOSIS — Z9889 Other specified postprocedural states: Secondary | ICD-10-CM

## 2011-07-09 DIAGNOSIS — Z853 Personal history of malignant neoplasm of breast: Secondary | ICD-10-CM

## 2011-08-21 ENCOUNTER — Other Ambulatory Visit: Payer: Self-pay | Admitting: Orthopedic Surgery

## 2011-08-21 DIAGNOSIS — IMO0002 Reserved for concepts with insufficient information to code with codable children: Secondary | ICD-10-CM

## 2011-08-22 ENCOUNTER — Ambulatory Visit
Admission: RE | Admit: 2011-08-22 | Discharge: 2011-08-22 | Disposition: A | Discharge status: Home / Self Care | Payer: Medicare Other | Source: Ambulatory Visit | Attending: Orthopedic Surgery | Admitting: Orthopedic Surgery

## 2011-08-22 DIAGNOSIS — IMO0002 Reserved for concepts with insufficient information to code with codable children: Secondary | ICD-10-CM

## 2011-09-03 ENCOUNTER — Ambulatory Visit
Admission: RE | Admit: 2011-09-03 | Discharge: 2011-09-03 | Disposition: A | Discharge status: Home / Self Care | Payer: Medicare Other | Source: Ambulatory Visit | Attending: General Surgery | Admitting: General Surgery

## 2011-09-03 DIAGNOSIS — Z853 Personal history of malignant neoplasm of breast: Secondary | ICD-10-CM

## 2011-09-03 DIAGNOSIS — Z9889 Other specified postprocedural states: Secondary | ICD-10-CM

## 2011-09-05 ENCOUNTER — Other Ambulatory Visit: Payer: Self-pay | Admitting: Oncology

## 2011-09-05 ENCOUNTER — Encounter (HOSPITAL_BASED_OUTPATIENT_CLINIC_OR_DEPARTMENT_OTHER): Payer: Medicare Other | Admitting: Oncology

## 2011-09-05 DIAGNOSIS — C50219 Malignant neoplasm of upper-inner quadrant of unspecified female breast: Secondary | ICD-10-CM

## 2011-09-05 DIAGNOSIS — Z171 Estrogen receptor negative status [ER-]: Secondary | ICD-10-CM

## 2011-09-05 LAB — CBC WITH DIFFERENTIAL/PLATELET
Eosinophils Absolute: 0.6 10*3/uL — ABNORMAL HIGH (ref 0.0–0.5)
LYMPH%: 20.3 % (ref 14.0–49.7)
MONO#: 0.6 10*3/uL (ref 0.1–0.9)
NEUT#: 4 10*3/uL (ref 1.5–6.5)
Platelets: 285 10*3/uL (ref 145–400)
RBC: 4.46 10*6/uL (ref 3.70–5.45)
RDW: 13.6 % (ref 11.2–14.5)
WBC: 6.6 10*3/uL (ref 3.9–10.3)

## 2011-09-05 LAB — COMPREHENSIVE METABOLIC PANEL
ALT: 14 U/L (ref 0–35)
Albumin: 4 g/dL (ref 3.5–5.2)
Alkaline Phosphatase: 66 U/L (ref 39–117)
CO2: 25 mEq/L (ref 19–32)
Glucose, Bld: 129 mg/dL — ABNORMAL HIGH (ref 70–99)
Potassium: 4.2 mEq/L (ref 3.5–5.3)
Sodium: 137 mEq/L (ref 135–145)
Total Bilirubin: 0.4 mg/dL (ref 0.3–1.2)
Total Protein: 6.8 g/dL (ref 6.0–8.3)

## 2011-09-05 LAB — CANCER ANTIGEN 27.29: CA 27.29: 31 U/mL (ref 0–39)

## 2011-09-09 ENCOUNTER — Encounter (INDEPENDENT_AMBULATORY_CARE_PROVIDER_SITE_OTHER): Payer: Self-pay | Admitting: General Surgery

## 2011-09-09 ENCOUNTER — Ambulatory Visit (INDEPENDENT_AMBULATORY_CARE_PROVIDER_SITE_OTHER): Payer: Medicare Other | Admitting: General Surgery

## 2011-09-09 VITALS — BP 140/78 | HR 88 | Temp 98.2°F | Resp 12 | Ht 63.0 in | Wt 153.4 lb

## 2011-09-09 DIAGNOSIS — Z853 Personal history of malignant neoplasm of breast: Secondary | ICD-10-CM

## 2011-09-09 NOTE — Patient Instructions (Signed)
Your breast exam is normal. There are no physical signs of cancer. Your mammograms are also normal. I suspect that you are cancer free. Be sure to get your mammograms in one year and see me after they are done.

## 2011-09-09 NOTE — Progress Notes (Signed)
Chief Complaint  Patient presents with  . Breast Cancer Long Term Follow Up    s/p left PM/ALND 2005 - T1c, N2, Her-2 pos.    HPI [name] is a 65 y.o. female.    This patient underwent left partial mastectomy and left axillary lymph node dissection on October 01, 2004. Her cancer was a stage T1c.,N 2, multifocal, positive lymphovascular invasion, 6/15 nodes involved, receptor-negative, HER-2 positive.  She is now 7 years out from her breast surgery she received herceptin and radiation therapy. She has no known recurrence to date. She sees Dr. Darnelle Catalan yearly and has an appointment with him in November at which time lab work will be performed.  Recent mammograms were performed on July 09, 2011. They are category one, no focal abnormality.  She has no complaints about her breast. Her biggest issue right nail is right arm numbness and weakness because of spinal stenosis. She is being evaluated for that. She does not have any pain in her neck.HPI  Past Medical History  Diagnosis Date  . Asthma   . Hypertension   . Anxiety   . Spinal stenosis   . Cancer     left breast    Past Surgical History  Procedure Date  . Laminectomy 1966  . Lymphectomy 2005  . Breast lumpectomy 2005    left    Family History  Problem Relation Age of Onset  . Stroke Mother   . Cancer Father     colon?  . Cancer Paternal Aunt   . Cancer Paternal Uncle     Social History History  Substance Use Topics  . Smoking status: Former Games developer  . Smokeless tobacco: Not on file   Comment: quit 1995  . Alcohol Use: 12.6 oz/week    21 Cans of beer per week     3 DAILY    No Known Allergies  Current Outpatient Prescriptions  Medication Sig Dispense Refill  . aspirin 81 MG tablet Take 81 mg by mouth daily.        . budesonide-formoterol (SYMBICORT) 160-4.5 MCG/ACT inhaler Inhale 2 puffs into the lungs 2 (two) times daily.        . calcium carbonate (OS-CAL) 600 MG TABS Take 600 mg by mouth 2  (two) times daily with a meal.        . cholecalciferol (VITAMIN D) 1000 UNITS tablet Take 1,000 Units by mouth daily.        . citalopram (CELEXA) 40 MG tablet Take 40 mg by mouth daily.        Marland Kitchen GLUCOSAMINE HCL PO Take by mouth.        Marland Kitchen omeprazole (PRILOSEC) 20 MG capsule take 1 capsule by mouth 30 TO 60 MINUTES BEFORE FIRST AND LAST MEALS OF THE DAY  60 capsule  0  . oxyCODONE-acetaminophen (PERCOCET) 5-325 MG per tablet Ad lib.      Marland Kitchen telmisartan (MICARDIS) 20 MG tablet Take 1 tablet (20 mg total) by mouth daily.  30 tablet  0    Review of Systems Review of Systems  Constitutional: Negative for fever, chills and unexpected weight change.  HENT: Negative for hearing loss, congestion, sore throat, trouble swallowing and voice change.   Eyes: Negative for visual disturbance.  Respiratory: Negative for cough and wheezing.   Cardiovascular: Negative for chest pain, palpitations and leg swelling.  Gastrointestinal: Negative for nausea, vomiting, abdominal pain, diarrhea, constipation, blood in stool, abdominal distention and anal bleeding.  Genitourinary: Negative for hematuria, vaginal  bleeding and difficulty urinating.  Musculoskeletal: Positive for back pain. Negative for arthralgias.       Cervical spinal stenosis  Skin: Negative for rash and wound.  Neurological: Negative for seizures, syncope and headaches.  Hematological: Negative for adenopathy. Does not bruise/bleed easily.  Psychiatric/Behavioral: Negative for confusion.    Blood pressure 140/78, pulse 88, temperature 98.2 F (36.8 C), temperature source Temporal, resp. rate 12, height 5\' 3"  (1.6 m), weight 153 lb 6.4 oz (69.582 kg).  Physical Exam Physical Exam  Constitutional: She is oriented to person, place, and time. She appears well-developed and well-nourished. No distress.  HENT:  Head: Normocephalic.  Eyes: Conjunctivae and EOM are normal. Pupils are equal, round, and reactive to light. Right eye exhibits no  discharge. Left eye exhibits no discharge. No scleral icterus.  Neck: Neck supple. No JVD present. No tracheal deviation present. No thyromegaly present.  Cardiovascular: Normal rate and normal heart sounds.   No murmur heard. Pulmonary/Chest: Effort normal and breath sounds normal. No stridor. No respiratory distress. She has no wheezes. She has no rales. She exhibits no tenderness.    Musculoskeletal: She exhibits no edema and no tenderness.  Lymphadenopathy:    She has no cervical adenopathy.  Neurological: She is alert and oriented to person, place, and time. Coordination normal.  Skin: Skin is warm. No rash noted. She is not diaphoretic. No erythema. No pallor.  Psychiatric: She has a normal mood and affect. Her behavior is normal. Judgment and thought content normal.    Data Reviewed I reviewed her mammograms and our old chart and the notes from Dr. Darnelle Catalan. Assessment    Invasive mammary carcinoma, left breast, pathologic stage T1c,, N2, multifocal, 6/15 nodes positive, receptor-negative, HER-2 positive.  No evidence of recurrence 7 years following left partial mastectomy axillary lymph node dissection and adjuvant Herceptin chemotherapy.  Spinal stenosis with upper extremity symptoms.  Asthma  Hypertension   Plan    Return to see me in one year after she gets her annual mammograms.  Continue routine followup with Dr. Darnelle Catalan.      Areeba Sulser M 09/09/2011, 10:00 AM

## 2011-09-19 ENCOUNTER — Encounter (HOSPITAL_BASED_OUTPATIENT_CLINIC_OR_DEPARTMENT_OTHER): Payer: Medicare Other | Admitting: Oncology

## 2011-09-19 DIAGNOSIS — Z923 Personal history of irradiation: Secondary | ICD-10-CM

## 2011-09-19 DIAGNOSIS — Z853 Personal history of malignant neoplasm of breast: Secondary | ICD-10-CM

## 2011-09-19 DIAGNOSIS — Z171 Estrogen receptor negative status [ER-]: Secondary | ICD-10-CM

## 2012-03-19 ENCOUNTER — Other Ambulatory Visit: Payer: Medicare Other | Admitting: Lab

## 2012-03-19 ENCOUNTER — Ambulatory Visit: Payer: Medicare Other | Admitting: Oncology

## 2012-06-02 ENCOUNTER — Other Ambulatory Visit: Payer: Self-pay | Admitting: Family Medicine

## 2012-06-02 DIAGNOSIS — N6321 Unspecified lump in the left breast, upper outer quadrant: Secondary | ICD-10-CM

## 2012-06-05 ENCOUNTER — Other Ambulatory Visit: Payer: Self-pay | Admitting: Family Medicine

## 2012-06-05 ENCOUNTER — Ambulatory Visit
Admission: RE | Admit: 2012-06-05 | Discharge: 2012-06-05 | Disposition: A | Discharge status: Home / Self Care | Payer: Medicare Other | Source: Ambulatory Visit | Attending: Family Medicine | Admitting: Family Medicine

## 2012-06-05 DIAGNOSIS — N6321 Unspecified lump in the left breast, upper outer quadrant: Secondary | ICD-10-CM

## 2012-07-16 ENCOUNTER — Other Ambulatory Visit: Payer: Self-pay | Admitting: Family Medicine

## 2012-07-16 DIAGNOSIS — Z853 Personal history of malignant neoplasm of breast: Secondary | ICD-10-CM

## 2012-08-20 ENCOUNTER — Telehealth: Payer: Self-pay | Admitting: Oncology

## 2012-08-20 NOTE — Telephone Encounter (Signed)
lmonvm for pt re appt for 11/27 and 12/3. Schedule mailed.

## 2012-08-21 ENCOUNTER — Telehealth: Payer: Self-pay | Admitting: Oncology

## 2012-08-21 NOTE — Telephone Encounter (Signed)
Returned pt's call re cancelling annual f/u appt (MOSAIQ). Per vm pt does not wish to come and states she called previously asking that appt be cx. lmonvm for pt confirming that appt has been cx'd. Message to desk nurse re above.

## 2012-09-03 ENCOUNTER — Ambulatory Visit
Admission: RE | Admit: 2012-09-03 | Discharge: 2012-09-03 | Disposition: A | Discharge status: Home / Self Care | Payer: Medicare Other | Source: Ambulatory Visit | Attending: Family Medicine | Admitting: Family Medicine

## 2012-09-03 DIAGNOSIS — Z853 Personal history of malignant neoplasm of breast: Secondary | ICD-10-CM

## 2012-10-14 ENCOUNTER — Other Ambulatory Visit: Payer: Medicare Other | Admitting: Lab

## 2012-10-20 ENCOUNTER — Ambulatory Visit: Payer: Medicare Other | Admitting: Oncology

## 2013-03-08 ENCOUNTER — Other Ambulatory Visit: Payer: Self-pay | Admitting: Oncology

## 2013-03-08 ENCOUNTER — Encounter: Payer: Self-pay | Admitting: Oncology

## 2013-03-24 ENCOUNTER — Other Ambulatory Visit: Payer: Self-pay | Admitting: Gastroenterology

## 2013-03-24 DIAGNOSIS — R1310 Dysphagia, unspecified: Secondary | ICD-10-CM

## 2013-03-29 ENCOUNTER — Ambulatory Visit
Admission: RE | Admit: 2013-03-29 | Discharge: 2013-03-29 | Disposition: A | Discharge status: Home / Self Care | Payer: Medicare Other | Source: Ambulatory Visit | Attending: Gastroenterology | Admitting: Gastroenterology

## 2013-03-29 DIAGNOSIS — R1310 Dysphagia, unspecified: Secondary | ICD-10-CM

## 2013-07-28 ENCOUNTER — Other Ambulatory Visit: Payer: Self-pay

## 2013-07-28 DIAGNOSIS — Z1231 Encounter for screening mammogram for malignant neoplasm of breast: Secondary | ICD-10-CM

## 2013-09-07 ENCOUNTER — Ambulatory Visit
Admission: RE | Admit: 2013-09-07 | Discharge: 2013-09-07 | Disposition: A | Discharge status: Home / Self Care | Payer: Medicare Other | Source: Ambulatory Visit

## 2013-09-07 DIAGNOSIS — Z1231 Encounter for screening mammogram for malignant neoplasm of breast: Secondary | ICD-10-CM

## 2014-08-03 ENCOUNTER — Other Ambulatory Visit: Payer: Self-pay

## 2014-08-03 DIAGNOSIS — Z1231 Encounter for screening mammogram for malignant neoplasm of breast: Secondary | ICD-10-CM

## 2014-09-27 ENCOUNTER — Ambulatory Visit: Payer: Medicare Other

## 2014-10-19 ENCOUNTER — Ambulatory Visit
Admission: RE | Admit: 2014-10-19 | Discharge: 2014-10-19 | Disposition: A | Discharge status: Home / Self Care | Payer: Medicare Other | Source: Ambulatory Visit

## 2014-10-19 ENCOUNTER — Encounter (INDEPENDENT_AMBULATORY_CARE_PROVIDER_SITE_OTHER): Payer: Self-pay

## 2014-10-19 DIAGNOSIS — Z1231 Encounter for screening mammogram for malignant neoplasm of breast: Secondary | ICD-10-CM

## 2015-05-12 ENCOUNTER — Other Ambulatory Visit: Payer: Self-pay | Admitting: Gastroenterology

## 2015-06-13 ENCOUNTER — Other Ambulatory Visit (HOSPITAL_COMMUNITY): Payer: Self-pay | Admitting: Rheumatology

## 2015-06-13 ENCOUNTER — Ambulatory Visit (HOSPITAL_COMMUNITY)
Admission: RE | Admit: 2015-06-13 | Discharge: 2015-06-13 | Disposition: A | Discharge status: Home / Self Care | Payer: Medicare Other | Source: Ambulatory Visit | Attending: Rheumatology | Admitting: Rheumatology

## 2015-06-13 DIAGNOSIS — C50919 Malignant neoplasm of unspecified site of unspecified female breast: Secondary | ICD-10-CM | POA: Diagnosis not present

## 2015-06-13 DIAGNOSIS — Z923 Personal history of irradiation: Secondary | ICD-10-CM | POA: Insufficient documentation

## 2015-06-13 DIAGNOSIS — T451X5A Adverse effect of antineoplastic and immunosuppressive drugs, initial encounter: Secondary | ICD-10-CM

## 2015-06-13 DIAGNOSIS — Z01818 Encounter for other preprocedural examination: Secondary | ICD-10-CM | POA: Diagnosis not present

## 2015-07-27 ENCOUNTER — Other Ambulatory Visit: Payer: Self-pay | Admitting: Physician Assistant

## 2015-09-25 ENCOUNTER — Other Ambulatory Visit: Payer: Self-pay

## 2015-09-25 DIAGNOSIS — Z1231 Encounter for screening mammogram for malignant neoplasm of breast: Secondary | ICD-10-CM

## 2015-11-01 ENCOUNTER — Ambulatory Visit
Admission: RE | Admit: 2015-11-01 | Discharge: 2015-11-01 | Disposition: A | Discharge status: Home / Self Care | Payer: Medicare Other | Source: Ambulatory Visit

## 2015-11-01 DIAGNOSIS — Z1231 Encounter for screening mammogram for malignant neoplasm of breast: Secondary | ICD-10-CM

## 2016-07-04 ENCOUNTER — Encounter (HOSPITAL_COMMUNITY)
Admission: EM | Disposition: A | Discharge status: Home / Self Care | Payer: Self-pay | Source: Home / Self Care | Attending: Cardiovascular Disease

## 2016-07-04 ENCOUNTER — Encounter (HOSPITAL_BASED_OUTPATIENT_CLINIC_OR_DEPARTMENT_OTHER): Payer: Self-pay | Admitting: *Deleted

## 2016-07-04 ENCOUNTER — Emergency Department (HOSPITAL_BASED_OUTPATIENT_CLINIC_OR_DEPARTMENT_OTHER): Payer: Medicare Other

## 2016-07-04 ENCOUNTER — Inpatient Hospital Stay (HOSPITAL_BASED_OUTPATIENT_CLINIC_OR_DEPARTMENT_OTHER)
Admission: EM | Admit: 2016-07-04 | Discharge: 2016-07-07 | DRG: 224 | Disposition: A | Discharge status: Home / Self Care | Payer: Medicare Other | Attending: Cardiovascular Disease | Admitting: Cardiovascular Disease

## 2016-07-04 DIAGNOSIS — F329 Major depressive disorder, single episode, unspecified: Secondary | ICD-10-CM | POA: Diagnosis present

## 2016-07-04 DIAGNOSIS — Z809 Family history of malignant neoplasm, unspecified: Secondary | ICD-10-CM

## 2016-07-04 DIAGNOSIS — I447 Left bundle-branch block, unspecified: Secondary | ICD-10-CM | POA: Diagnosis present

## 2016-07-04 DIAGNOSIS — M069 Rheumatoid arthritis, unspecified: Secondary | ICD-10-CM | POA: Diagnosis present

## 2016-07-04 DIAGNOSIS — Z79899 Other long term (current) drug therapy: Secondary | ICD-10-CM | POA: Diagnosis not present

## 2016-07-04 DIAGNOSIS — I214 Non-ST elevation (NSTEMI) myocardial infarction: Secondary | ICD-10-CM | POA: Diagnosis present

## 2016-07-04 DIAGNOSIS — R55 Syncope and collapse: Secondary | ICD-10-CM | POA: Diagnosis present

## 2016-07-04 DIAGNOSIS — Z87891 Personal history of nicotine dependence: Secondary | ICD-10-CM

## 2016-07-04 DIAGNOSIS — J45901 Unspecified asthma with (acute) exacerbation: Secondary | ICD-10-CM | POA: Diagnosis present

## 2016-07-04 DIAGNOSIS — J45909 Unspecified asthma, uncomplicated: Secondary | ICD-10-CM | POA: Diagnosis present

## 2016-07-04 DIAGNOSIS — I442 Atrioventricular block, complete: Secondary | ICD-10-CM | POA: Diagnosis present

## 2016-07-04 DIAGNOSIS — I10 Essential (primary) hypertension: Secondary | ICD-10-CM | POA: Insufficient documentation

## 2016-07-04 DIAGNOSIS — I472 Ventricular tachycardia, unspecified: Secondary | ICD-10-CM

## 2016-07-04 DIAGNOSIS — Z7951 Long term (current) use of inhaled steroids: Secondary | ICD-10-CM

## 2016-07-04 DIAGNOSIS — I5022 Chronic systolic (congestive) heart failure: Secondary | ICD-10-CM | POA: Diagnosis not present

## 2016-07-04 DIAGNOSIS — Z853 Personal history of malignant neoplasm of breast: Secondary | ICD-10-CM | POA: Diagnosis not present

## 2016-07-04 DIAGNOSIS — F419 Anxiety disorder, unspecified: Secondary | ICD-10-CM | POA: Diagnosis present

## 2016-07-04 DIAGNOSIS — Z7982 Long term (current) use of aspirin: Secondary | ICD-10-CM | POA: Diagnosis not present

## 2016-07-04 DIAGNOSIS — I255 Ischemic cardiomyopathy: Secondary | ICD-10-CM | POA: Diagnosis present

## 2016-07-04 DIAGNOSIS — Z9861 Coronary angioplasty status: Secondary | ICD-10-CM

## 2016-07-04 DIAGNOSIS — I2510 Atherosclerotic heart disease of native coronary artery without angina pectoris: Secondary | ICD-10-CM | POA: Diagnosis present

## 2016-07-04 DIAGNOSIS — Z950 Presence of cardiac pacemaker: Secondary | ICD-10-CM | POA: Diagnosis not present

## 2016-07-04 DIAGNOSIS — Z8739 Personal history of other diseases of the musculoskeletal system and connective tissue: Secondary | ICD-10-CM

## 2016-07-04 HISTORY — DX: Rheumatoid arthritis, unspecified: M06.9

## 2016-07-04 HISTORY — DX: Alcohol use, unspecified, uncomplicated: F10.90

## 2016-07-04 HISTORY — DX: Major depressive disorder, single episode, unspecified: F32.9

## 2016-07-04 HISTORY — PX: CARDIAC CATHETERIZATION: SHX172

## 2016-07-04 HISTORY — DX: Depression, unspecified: F32.A

## 2016-07-04 HISTORY — DX: Malignant neoplasm of unspecified site of unspecified female breast: C50.919

## 2016-07-04 HISTORY — DX: Other problems related to lifestyle: Z72.89

## 2016-07-04 LAB — COMPREHENSIVE METABOLIC PANEL
ALT: 19 U/L (ref 14–54)
AST: 27 U/L (ref 15–41)
Albumin: 3.7 g/dL (ref 3.5–5.0)
Alkaline Phosphatase: 51 U/L (ref 38–126)
Anion gap: 6 (ref 5–15)
BILIRUBIN TOTAL: 0.6 mg/dL (ref 0.3–1.2)
BUN: 20 mg/dL (ref 6–20)
CALCIUM: 8.6 mg/dL — AB (ref 8.9–10.3)
CO2: 26 mmol/L (ref 22–32)
CREATININE: 0.86 mg/dL (ref 0.44–1.00)
Chloride: 101 mmol/L (ref 101–111)
GFR calc Af Amer: >60 mL/min (ref >60)
Glucose, Bld: 97 mg/dL (ref 65–99)
Potassium: 4.2 mmol/L (ref 3.5–5.1)
Sodium: 133 mmol/L — ABNORMAL LOW (ref 135–145)
TOTAL PROTEIN: 6.1 g/dL — AB (ref 6.5–8.1)

## 2016-07-04 LAB — CBC WITH DIFFERENTIAL/PLATELET
BASOS ABS: 0 10*3/uL (ref 0.0–0.1)
Basophils Relative: 1 %
Eosinophils Absolute: 0.3 10*3/uL (ref 0.0–0.7)
Eosinophils Relative: 4 %
HEMATOCRIT: 34.7 % — AB (ref 36.0–46.0)
HEMOGLOBIN: 12.2 g/dL (ref 12.0–15.0)
LYMPHS PCT: 12 %
Lymphs Abs: 1 10*3/uL (ref 0.7–4.0)
MCH: 33.2 pg (ref 26.0–34.0)
MCHC: 35.2 g/dL (ref 30.0–36.0)
MCV: 94.6 fL (ref 78.0–100.0)
Monocytes Absolute: 0.6 10*3/uL (ref 0.1–1.0)
Monocytes Relative: 7 %
NEUTROS ABS: 6.4 10*3/uL (ref 1.7–7.7)
Neutrophils Relative %: 76 %
Platelets: 251 10*3/uL (ref 150–400)
RBC: 3.67 MIL/uL — AB (ref 3.87–5.11)
RDW: 14 % (ref 11.5–15.5)
WBC: 8.4 10*3/uL (ref 4.0–10.5)

## 2016-07-04 LAB — TROPONIN I
Troponin I: 0.03 ng/mL (ref <0.03)
Troponin I: 0.03 ng/mL (ref <0.03)

## 2016-07-04 SURGERY — LEFT HEART CATH AND CORONARY ANGIOGRAPHY
Anesthesia: LOCAL

## 2016-07-04 MED ORDER — ALBUTEROL SULFATE (2.5 MG/3ML) 0.083% IN NEBU: 2.5000 mg | INHALATION_SOLUTION | Freq: Four times a day (QID) | RESPIRATORY_TRACT | Status: DC | PRN

## 2016-07-04 MED ORDER — ASPIRIN 81 MG PO CHEW
CHEWABLE_TABLET | ORAL | Status: AC
Start: 2016-07-04 — End: 2016-07-04
  Filled 2016-07-04: qty 1

## 2016-07-04 MED ORDER — FENTANYL CITRATE (PF) 100 MCG/2ML IJ SOLN
INTRAMUSCULAR | Status: DC | PRN
  Administered 2016-07-04: 50 ug via INTRAVENOUS
  Administered 2016-07-04 (×2): 25 ug via INTRAVENOUS
  Administered 2016-07-04: 50 ug via INTRAVENOUS

## 2016-07-04 MED ORDER — VITAMIN D 1000 UNITS PO TABS
1000.0000 [IU] | ORAL_TABLET | Freq: Every day | ORAL | Status: DC
  Administered 2016-07-05 – 2016-07-07 (×3): 1000 [IU] via ORAL
  Filled 2016-07-04 (×3): qty 1

## 2016-07-04 MED ORDER — FENTANYL CITRATE (PF) 100 MCG/2ML IJ SOLN
INTRAMUSCULAR | Status: AC
  Filled 2016-07-04: qty 2

## 2016-07-04 MED ORDER — ASPIRIN 325 MG PO TABS
ORAL_TABLET | ORAL | Status: DC | PRN
  Administered 2016-07-04: 325 mg via ORAL

## 2016-07-04 MED ORDER — TIROFIBAN HCL IN NACL 5-0.9 MG/100ML-% IV SOLN
INTRAVENOUS | Status: AC
  Filled 2016-07-04: qty 100

## 2016-07-04 MED ORDER — TIROFIBAN HCL IN NACL 5-0.9 MG/100ML-% IV SOLN
0.0750 ug/kg/min | INTRAVENOUS | Status: AC
  Administered 2016-07-04: 0.075 ug/kg/min via INTRAVENOUS
  Filled 2016-07-04: qty 100

## 2016-07-04 MED ORDER — NITROGLYCERIN IN D5W 200-5 MCG/ML-% IV SOLN
INTRAVENOUS | Status: AC
  Filled 2016-07-04: qty 250

## 2016-07-04 MED ORDER — TICAGRELOR 90 MG PO TABS
90.0000 mg | ORAL_TABLET | Freq: Two times a day (BID) | ORAL | Status: DC
Start: 2016-07-05 — End: 2016-07-05

## 2016-07-04 MED ORDER — ASPIRIN 81 MG PO CHEW
CHEWABLE_TABLET | ORAL | Status: AC
  Filled 2016-07-04: qty 1

## 2016-07-04 MED ORDER — MIDAZOLAM HCL 2 MG/2ML IJ SOLN
INTRAMUSCULAR | Status: DC | PRN
  Administered 2016-07-04: 1 mg via INTRAVENOUS
  Administered 2016-07-04: 0.5 mg via INTRAVENOUS

## 2016-07-04 MED ORDER — IOPAMIDOL (ISOVUE-370) INJECTION 76%
INTRAVENOUS | Status: DC | PRN
  Administered 2016-07-04: 195 mL via INTRA_ARTERIAL

## 2016-07-04 MED ORDER — NITROGLYCERIN 1 MG/10 ML FOR IR/CATH LAB
INTRA_ARTERIAL | Status: AC
  Filled 2016-07-04: qty 10

## 2016-07-04 MED ORDER — MIDAZOLAM HCL 2 MG/2ML IJ SOLN
INTRAMUSCULAR | Status: AC
  Filled 2016-07-04: qty 2

## 2016-07-04 MED ORDER — LIDOCAINE HCL (PF) 1 % IJ SOLN
INTRAMUSCULAR | Status: DC | PRN
  Administered 2016-07-04: 20 mL

## 2016-07-04 MED ORDER — TIROFIBAN (AGGRASTAT) BOLUS VIA INFUSION
INTRAVENOUS | Status: DC | PRN
  Administered 2016-07-04: 1700 ug via INTRAVENOUS

## 2016-07-04 MED ORDER — TICAGRELOR 90 MG PO TABS
ORAL_TABLET | ORAL | Status: AC
  Filled 2016-07-04: qty 1

## 2016-07-04 MED ORDER — CITALOPRAM HYDROBROMIDE 20 MG PO TABS
40.0000 mg | ORAL_TABLET | Freq: Every day | ORAL | Status: DC
  Administered 2016-07-05 – 2016-07-07 (×3): 40 mg via ORAL
  Filled 2016-07-04 (×3): qty 2

## 2016-07-04 MED ORDER — IOPAMIDOL (ISOVUE-370) INJECTION 76%
INTRAVENOUS | Status: AC
  Filled 2016-07-04: qty 100

## 2016-07-04 MED ORDER — HEPARIN (PORCINE) IN NACL 2-0.9 UNIT/ML-% IJ SOLN
INTRAMUSCULAR | Status: AC
  Filled 2016-07-04: qty 1000

## 2016-07-04 MED ORDER — NITROGLYCERIN IN D5W 200-5 MCG/ML-% IV SOLN
INTRAVENOUS | Status: DC | PRN
  Administered 2016-07-04: 10 ug/min via INTRAVENOUS

## 2016-07-04 MED ORDER — OXYCODONE-ACETAMINOPHEN 5-325 MG PO TABS
1.0000 | ORAL_TABLET | ORAL | Status: DC | PRN
  Administered 2016-07-05 – 2016-07-07 (×6): 2 via ORAL
  Filled 2016-07-04 (×6): qty 2

## 2016-07-04 MED ORDER — FENTANYL CITRATE (PF) 100 MCG/2ML IJ SOLN
INTRAMUSCULAR | Status: AC
Start: 2016-07-04 — End: 2016-07-04
  Filled 2016-07-04: qty 2

## 2016-07-04 MED ORDER — TIROFIBAN HCL IN NACL 5-0.9 MG/100ML-% IV SOLN
INTRAVENOUS | Status: DC | PRN
  Administered 2016-07-04: 0.15 ug/kg/min via INTRAVENOUS

## 2016-07-04 MED ORDER — CALCIUM CARBONATE 1250 (500 CA) MG PO TABS
1250.0000 mg | ORAL_TABLET | Freq: Two times a day (BID) | ORAL | Status: DC
  Administered 2016-07-05 – 2016-07-07 (×4): 1250 mg via ORAL
  Filled 2016-07-04 (×5): qty 1

## 2016-07-04 MED ORDER — NITROGLYCERIN 1 MG/10 ML FOR IR/CATH LAB
INTRA_ARTERIAL | Status: DC | PRN
  Administered 2016-07-04: 200 ug via INTRACORONARY

## 2016-07-04 MED ORDER — SODIUM CHLORIDE 0.9 % WEIGHT BASED INFUSION: 1.0000 mL/kg/h | INTRAVENOUS | Status: AC

## 2016-07-04 MED ORDER — MOMETASONE FURO-FORMOTEROL FUM 200-5 MCG/ACT IN AERO
2.0000 | INHALATION_SPRAY | Freq: Two times a day (BID) | RESPIRATORY_TRACT | Status: DC
  Administered 2016-07-05 – 2016-07-07 (×4): 2 via RESPIRATORY_TRACT
  Filled 2016-07-04 (×2): qty 8.8

## 2016-07-04 MED ORDER — ASPIRIN EC 81 MG PO TBEC
81.0000 mg | DELAYED_RELEASE_TABLET | Freq: Every day | ORAL | Status: DC
Start: 2016-07-05 — End: 2016-07-07
  Administered 2016-07-05 – 2016-07-07 (×3): 81 mg via ORAL
  Filled 2016-07-04 (×3): qty 1

## 2016-07-04 MED ORDER — HEPARIN (PORCINE) IN NACL 2-0.9 UNIT/ML-% IJ SOLN
INTRAMUSCULAR | Status: DC | PRN
  Administered 2016-07-04: 1000 mL

## 2016-07-04 MED ORDER — PANTOPRAZOLE SODIUM 40 MG PO TBEC
40.0000 mg | DELAYED_RELEASE_TABLET | Freq: Every day | ORAL | Status: DC
  Administered 2016-07-05 – 2016-07-07 (×3): 40 mg via ORAL
  Filled 2016-07-04 (×3): qty 1

## 2016-07-04 MED ORDER — ASPIRIN 300 MG RE SUPP: 300.0000 mg | RECTAL | Status: DC

## 2016-07-04 MED ORDER — MORPHINE SULFATE (PF) 2 MG/ML IV SOLN
2.0000 mg | INTRAVENOUS | Status: DC | PRN
  Administered 2016-07-05 – 2016-07-06 (×6): 2 mg via INTRAVENOUS
  Filled 2016-07-04 (×5): qty 1

## 2016-07-04 MED ORDER — HEPARIN SODIUM (PORCINE) 1000 UNIT/ML IJ SOLN
INTRAMUSCULAR | Status: AC
  Filled 2016-07-04: qty 1

## 2016-07-04 MED ORDER — TICAGRELOR 90 MG PO TABS
ORAL_TABLET | ORAL | Status: DC | PRN
  Administered 2016-07-04: 180 mg via ORAL

## 2016-07-04 MED ORDER — SODIUM CHLORIDE 0.9% FLUSH: 3.0000 mL | INTRAVENOUS | Status: DC | PRN

## 2016-07-04 MED ORDER — ACETAMINOPHEN 325 MG PO TABS: 650.0000 mg | ORAL_TABLET | ORAL | Status: DC | PRN

## 2016-07-04 MED ORDER — ASPIRIN 81 MG PO CHEW: 324.0000 mg | CHEWABLE_TABLET | ORAL | Status: DC

## 2016-07-04 MED ORDER — ONDANSETRON HCL 4 MG/2ML IJ SOLN: 4.0000 mg | Freq: Four times a day (QID) | INTRAMUSCULAR | Status: DC | PRN

## 2016-07-04 MED ORDER — SODIUM CHLORIDE 0.9 % IV SOLN: 250.0000 mL | INTRAVENOUS | Status: DC | PRN

## 2016-07-04 MED ORDER — ASPIRIN 81 MG PO CHEW: 81.0000 mg | CHEWABLE_TABLET | Freq: Every day | ORAL | Status: DC

## 2016-07-04 MED ORDER — IOPAMIDOL (ISOVUE-370) INJECTION 76%
INTRAVENOUS | Status: AC
  Filled 2016-07-04: qty 50

## 2016-07-04 MED ORDER — AMIODARONE IV BOLUS ONLY 150 MG/100ML
150.0000 mg | Freq: Once | INTRAVENOUS | Status: AC
  Administered 2016-07-04: 150 mg via INTRAVENOUS
  Filled 2016-07-04: qty 100

## 2016-07-04 MED ORDER — SODIUM CHLORIDE 0.9% FLUSH
3.0000 mL | Freq: Two times a day (BID) | INTRAVENOUS | Status: DC
  Administered 2016-07-05 – 2016-07-07 (×4): 3 mL via INTRAVENOUS

## 2016-07-04 MED ORDER — SODIUM CHLORIDE 0.9 % IV SOLN
INTRAVENOUS | Status: DC
  Administered 2016-07-04: 16:00:00 via INTRAVENOUS

## 2016-07-04 MED ORDER — NITROGLYCERIN IN D5W 200-5 MCG/ML-% IV SOLN
20.0000 ug/min | INTRAVENOUS | Status: DC
Start: 2016-07-04 — End: 2016-07-07

## 2016-07-04 MED ORDER — AMIODARONE HCL IN DEXTROSE 360-4.14 MG/200ML-% IV SOLN
60.0000 mg/h | Freq: Once | INTRAVENOUS | Status: DC
  Filled 2016-07-04: qty 200

## 2016-07-04 MED ORDER — ACETAMINOPHEN 325 MG PO TABS
650.0000 mg | ORAL_TABLET | ORAL | Status: DC | PRN
  Administered 2016-07-05 – 2016-07-06 (×2): 650 mg via ORAL
  Filled 2016-07-04 (×2): qty 2

## 2016-07-04 MED ORDER — ALPRAZOLAM 0.25 MG PO TABS
0.2500 mg | ORAL_TABLET | Freq: Every evening | ORAL | Status: DC | PRN
  Administered 2016-07-04: 0.25 mg via ORAL
  Filled 2016-07-04: qty 1

## 2016-07-04 MED ORDER — HEPARIN SODIUM (PORCINE) 5000 UNIT/ML IJ SOLN
5000.0000 [IU] | Freq: Three times a day (TID) | INTRAMUSCULAR | Status: DC
  Administered 2016-07-06 – 2016-07-07 (×4): 5000 [IU] via SUBCUTANEOUS
  Filled 2016-07-04 (×4): qty 1

## 2016-07-04 MED ORDER — HEPARIN SODIUM (PORCINE) 1000 UNIT/ML IJ SOLN
INTRAMUSCULAR | Status: DC | PRN
  Administered 2016-07-04: 2500 [IU] via INTRAVENOUS
  Administered 2016-07-04: 5000 [IU] via INTRAVENOUS
  Administered 2016-07-04: 2500 [IU] via INTRAVENOUS

## 2016-07-04 MED ORDER — SODIUM CHLORIDE 0.9 % IV BOLUS (SEPSIS)
250.0000 mL | Freq: Once | INTRAVENOUS | Status: AC
  Administered 2016-07-04: 250 mL via INTRAVENOUS

## 2016-07-04 MED ORDER — LIDOCAINE HCL (PF) 1 % IJ SOLN
INTRAMUSCULAR | Status: AC
  Filled 2016-07-04: qty 30

## 2016-07-04 SURGICAL SUPPLY — 16 items
CATH EXTRAC PRONTO 5.5F 138CM (CATHETERS) ×2 IMPLANT
CATH INFINITI 5FR JL4 (CATHETERS) ×2 IMPLANT
CATH S G BIP PACING (SET/KITS/TRAYS/PACK) ×2 IMPLANT
CATH SITESEER 5F MULTI A 2 (CATHETERS) ×2 IMPLANT
CATH VISTA GUIDE 6FR XBLAD3.0 (CATHETERS) ×2 IMPLANT
ELECT DEFIB PAD ADLT CADENCE (PAD) ×2 IMPLANT
KIT ENCORE 26 ADVANTAGE (KITS) ×2 IMPLANT
KIT HEART LEFT (KITS) ×2 IMPLANT
PACK CARDIAC CATHETERIZATION (CUSTOM PROCEDURE TRAY) ×2 IMPLANT
SHEATH PINNACLE 5F 10CM (SHEATH) ×2 IMPLANT
SHEATH PINNACLE 6F 10CM (SHEATH) ×4 IMPLANT
SLEEVE REPOSITIONING LENGTH 30 (MISCELLANEOUS) ×2 IMPLANT
TRANSDUCER W/STOPCOCK (MISCELLANEOUS) ×2 IMPLANT
TUBING CIL FLEX 10 FLL-RA (TUBING) ×2 IMPLANT
WIRE ASAHI PROWATER 180CM (WIRE) ×2 IMPLANT
WIRE EMERALD 3MM-J .035X150CM (WIRE) ×2 IMPLANT

## 2016-07-04 NOTE — ED Notes (Signed)
Pt had 14 beat run v-tach. EDP Zackowski notified and to bedside to assess

## 2016-07-04 NOTE — ED Notes (Addendum)
XR at bedside

## 2016-07-04 NOTE — ED Notes (Signed)
MD at bedside. 

## 2016-07-04 NOTE — ED Notes (Signed)
Pt also has a small lac on forehead, and bruising to L arm. Pt states she was in a seated position and fell out of the chair when the syncope occurred.

## 2016-07-04 NOTE — ED Provider Notes (Addendum)
[facility] [facility] Provider Note   CSN: [id number] Arrival date & time: 07/04/16  1506     History   Chief Complaint Chief Complaint  Patient presents with  . Dizziness    HPI [name] is a 69 y.o. female.  The patient with syncopal episode while in changing room while shopping. Patient was sitting down when she passed out. She sat down because she felt lightheaded. Patient denies any chest pain any shortness of breath any worse than her baseline asthma. Patient maybe with some slight lightheadedness for the past few days. Patient has not had any history of syncope in the past.      Past Medical History:  Diagnosis Date  . Anxiety   . Asthma   . Cancer ([place])    left breast  . Hypertension   . RA (rheumatoid arthritis) ([place])   . Spinal stenosis     Patient Active Problem List   Diagnosis Date Noted  . Complete heart block ([place]) 07/04/2016  . Exostosis 06/18/2011  . BREAST CANCER 02/16/2010  . VITAMIN D DEFICIENCY 02/15/2010  . HYPERCHOLESTEROLEMIA 02/15/2010  . ANXIETY 02/15/2010  . HYPERTENSION, BENIGN ESSENTIAL 02/15/2010  . ASTHMA 02/15/2010  . MIGRAINES, HX OF 02/15/2010  . MASTECTOMY, LEFT, HX OF 02/15/2010    Past Surgical History:  Procedure Laterality Date  . BREAST LUMPECTOMY  2005   left  . LAMINECTOMY  1966  . LYMPHECTOMY  2005    OB History    No data available       Home Medications    Prior to Admission medications   Medication Sig Start Date End Date Taking? Authorizing Provider  albuterol (PROVENTIL HFA;VENTOLIN HFA) 108 (90 Base) MCG/ACT inhaler Inhale into the lungs every 6 (six) hours as needed for wheezing or shortness of breath.   Yes Historical Provider, MD  ALPRAZolam (NIRAVAM) 0.25 MG dissolvable tablet Take 0.25 mg by mouth at bedtime as needed for anxiety.   Yes Historical Provider, MD  hydroxychloroquine (PLAQUENIL) 200 MG tablet Take by mouth daily.   Yes Historical Provider, MD    losartan-hydrochlorothiazide (HYZAAR) 100-12.5 MG tablet Take 1 tablet by mouth daily.   Yes Historical Provider, MD  methotrexate (RHEUMATREX) 2.5 MG tablet Take 2.5 mg by mouth once a week. Caution:Chemotherapy. Protect from light.   Yes Historical Provider, MD  aspirin 81 MG tablet Take 81 mg by mouth daily.      Historical Provider, MD  budesonide-formoterol (SYMBICORT) 160-4.5 MCG/ACT inhaler Inhale 2 puffs into the lungs 2 (two) times daily.      Historical Provider, MD  calcium carbonate (OS-CAL) 600 MG TABS Take 600 mg by mouth 2 (two) times daily with a meal.      Historical Provider, MD  cholecalciferol (VITAMIN D) 1000 UNITS tablet Take 1,000 Units by mouth daily.      Historical Provider, MD  citalopram (CELEXA) 40 MG tablet Take 40 mg by mouth daily.      Historical Provider, MD  GLUCOSAMINE HCL PO Take by mouth.      Historical Provider, MD  omeprazole (PRILOSEC) 20 MG capsule take 1 capsule by mouth 30 TO 60 MINUTES BEFORE FIRST AND LAST MEALS OF THE DAY 04/09/11   Tanda Rockers, MD  oxyCODONE-acetaminophen (PERCOCET) 5-325 MG per tablet Ad lib. 08/22/11   Historical Provider, MD    Family History Family History  Problem Relation Age of Onset  . Stroke Mother   . Cancer Father     colon?  Marland Kitchen  Cancer Paternal Aunt   . Cancer Paternal Uncle     Social History Social History  Substance Use Topics  . Smoking status: Former Research scientist (life sciences)  . Smokeless tobacco: Never Used     Comment: quit 1995  . Alcohol use 12.6 oz/week    21 Cans of beer per week     Comment: 3 DAILY     Allergies   Review of patient's allergies indicates no known allergies.   Review of Systems Review of Systems  Constitutional: Negative for fever.  HENT: Negative for congestion.   Eyes: Negative for visual disturbance.  Respiratory: Positive for shortness of breath.   Cardiovascular: Negative for chest pain.  Gastrointestinal: Negative for abdominal pain, nausea and vomiting.  Genitourinary: Negative  for dysuria.  Musculoskeletal: Negative for neck pain.  Skin: Negative for rash.  Neurological: Positive for syncope. Negative for headaches.  Hematological: Does not bruise/bleed easily.  Psychiatric/Behavioral: Negative for confusion.     Physical Exam Updated Vital Signs BP (!) 141/46   Pulse (!) 39   Temp 97.7 F (36.5 C) (Oral)   Resp 16   Ht 5\' 2"  (1.575 m)   Wt 68 kg   SpO2 100%   BMI 27.44 kg/m   Physical Exam  Constitutional: She is oriented to person, place, and time. She appears well-developed and well-nourished. No distress.  HENT:  Head: Normocephalic and atraumatic.  Mouth/Throat: Oropharynx is clear and moist.  Eyes: EOM are normal. Pupils are equal, round, and reactive to light.  Neck: Normal range of motion. Neck supple.  Cardiovascular: Normal rate and regular rhythm.   Pulmonary/Chest: Effort normal and breath sounds normal.  Abdominal: Soft. There is no tenderness.  Musculoskeletal: Normal range of motion. She exhibits no edema.  Neurological: She is alert and oriented to person, place, and time. No cranial nerve deficit. She exhibits normal muscle tone. Coordination normal.  Skin: Skin is warm.  Nursing note and vitals reviewed.    ED Treatments / Results  Labs (all labs ordered are listed, but only abnormal results are displayed) Labs Reviewed  CBC WITH DIFFERENTIAL/PLATELET - Abnormal; Notable for the following:       Result Value   RBC 3.67 (*)    HCT 34.7 (*)    All other components within normal limits  COMPREHENSIVE METABOLIC PANEL - Abnormal; Notable for the following:    Sodium 133 (*)    Calcium 8.6 (*)    Total Protein 6.1 (*)    All other components within normal limits  TROPONIN I - Abnormal; Notable for the following:    Troponin I 0.03 (*)    All other components within normal limits   Results for orders placed or performed during the hospital encounter of 07/04/16  CBC with Differential/Platelet  Result Value Ref Range    WBC 8.4 4.0 - 10.5 K/uL   RBC 3.67 (L) 3.87 - 5.11 MIL/uL   Hemoglobin 12.2 12.0 - 15.0 g/dL   HCT 34.7 (L) 36.0 - 46.0 %   MCV 94.6 78.0 - 100.0 fL   MCH 33.2 26.0 - 34.0 pg   MCHC 35.2 30.0 - 36.0 g/dL   RDW 14.0 11.5 - 15.5 %   Platelets 251 150 - 400 K/uL   Neutrophils Relative % 76 %   Neutro Abs 6.4 1.7 - 7.7 K/uL   Lymphocytes Relative 12 %   Lymphs Abs 1.0 0.7 - 4.0 K/uL   Monocytes Relative 7 %   Monocytes Absolute 0.6 0.1 - 1.0  K/uL   Eosinophils Relative 4 %   Eosinophils Absolute 0.3 0.0 - 0.7 K/uL   Basophils Relative 1 %   Basophils Absolute 0.0 0.0 - 0.1 K/uL  Comprehensive metabolic panel  Result Value Ref Range   Sodium 133 (L) 135 - 145 mmol/L   Potassium 4.2 3.5 - 5.1 mmol/L   Chloride 101 101 - 111 mmol/L   CO2 26 22 - 32 mmol/L   Glucose, Bld 97 65 - 99 mg/dL   BUN 20 6 - 20 mg/dL   Creatinine, Ser 0.86 0.44 - 1.00 mg/dL   Calcium 8.6 (L) 8.9 - 10.3 mg/dL   Total Protein 6.1 (L) 6.5 - 8.1 g/dL   Albumin 3.7 3.5 - 5.0 g/dL   AST 27 15 - 41 U/L   ALT 19 14 - 54 U/L   Alkaline Phosphatase 51 38 - 126 U/L   Total Bilirubin 0.6 0.3 - 1.2 mg/dL   GFR calc non Af Amer >60 >60 mL/min   GFR calc Af Amer >60 >60 mL/min   Anion gap 6 5 - 15  Troponin I  Result Value Ref Range   Troponin I 0.03 (HH) <0.03 ng/mL     EKG  EKG Interpretation  Date/Time:  Thursday July 04 2016 15:24:34 EDT Ventricular Rate:  43 PR Interval:    QRS Duration: 139 QT Interval:  639 QTC Calculation: 535 R Axis:   -44 Text Interpretation:  Third degree heart block Left bundle branch block Reconfirmed by Teara Duerksen  MD, Emery Dupuy [phone]) on 07/04/2016 3:41:34 PM       Radiology Dg Chest Port 1 View  Result Date: 07/04/2016 CLINICAL DATA:  Syncope.  History of breast carcinoma EXAM: PORTABLE CHEST 1 VIEW COMPARISON:  June 13, 2015 FINDINGS: The interstitium is mildly prominent in a generalized manner, likely due to chronic inflammatory type change. There is no frank edema or  consolidation. Heart size and pulmonary vascularity are normal. No adenopathy. There are surgical clips in left axillary region. No bone lesions are evident. IMPRESSION: Surgical clips in left axilla. Interstitial thickening diffusely without edema or consolidation. Interstitial markings are marginally more prominent on the left than on the right, a finding that may be due to residua from previous radiation therapy for breast carcinoma on the left. No adenopathy. Electronically Signed   By: Lowella Grip III M.D.   On: 07/04/2016 16:30    Procedures Procedures (including critical care time)  Medications Ordered in ED Medications  0.9 %  sodium chloride infusion ( Intravenous New Bag/Given 07/04/16 1608)  amiodarone (NEXTERONE) IV bolus only 150 mg/100 mL (not administered)  amiodarone (NEXTERONE PREMIX) 360-4.14 MG/200ML-% (1.8 mg/mL) IV infusion (not administered)  sodium chloride 0.9 % bolus 250 mL (0 mLs Intravenous Stopped 07/04/16 1630)     Initial Impression / Assessment and Plan / ED Course  I have reviewed the triage vital signs and the nursing notes.  Pertinent labs & imaging results that were available during my care of the patient were reviewed by me and considered in my medical decision making (see chart for details).  Clinical Course   Patient with a single episode the changing room while shopping was sitting down and fell from the sitting position of slight bruise to the left side of her for head at the eyebrow. Slight bruise to her left arm no significant injuries. Patient not on blood thinners. Does not require CT of head or neck. No concerns for fracture of the left forearm. However patient EKG  and cardiac monitor was consistent with complete heart block. This is a new finding for this patient. She's had a little bit of lightheadedness the past of few days. But nothing in the way of chest pain or significant or worsening shortness of breath. Not sure how long she's been in  this of heart block. Patient will be admitted to the cardiac care unit by Dr. Claiborne Billings cardiology. Patient will be put on pacer pads but does not require pacing. Patient's blood pressure systolic blood pressures of been in the 1:15. Patient has been very stable here.  Rest of workup to include chest x-ray basic labs without any acute findings other than a borderline troponin at 0.03. Patient without complaint of any chest pain.  Patient will be placed on pacer pads for transportation by [facility].   Final Clinical Impressions(s) / ED Diagnoses   Final diagnoses:  Complete heart block ([place])  Syncope, unspecified syncope type  Ventricular tachycardia [facility])    New Prescriptions New Prescriptions   No medications on file     Fredia Sorrow, MD 07/04/16 1752   Addendum: Patient with sustained V. tach one episode of a thought maybe was artifact and she did it again. I think it is a legitimate. Will start with Robyne Askew unload and amiodarone drip. Awaiting [facility] to transfer her to CCU. If [facility] is delayed enough we'll repeat the troponin.  The [facility] currently here to transport patient. Patient received her Robyne Askew unload. Were completely aware of the concerns of worsening complete heart block for any bradycardia arrhythmias with M Roeder ROM. However patient had a very prolonged sustained period of V. tach. We'll go ahead and hold drip during the transportation since patient seems to be stabilizing out. Complete heart block has so far not gotten significantly worse. Patient was symptomatic could tell that she had the rapid palpitations. As stated above felt that this was legitimate. Patient also became very lightheaded while the V. tach was occurring.    CRITICAL CARE Performed by: Fredia Sorrow Total critical care time: 60 minutes Critical care time was exclusive of separately billable procedures and treating other patients. Critical care was necessary to treat or prevent  imminent or life-threatening deterioration. Critical care was time spent personally by me on the following activities: development of treatment plan with patient and/or surrogate as well as nursing, discussions with consultants, evaluation of patient's response to treatment, examination of patient, obtaining history from patient or surrogate, ordering and performing treatments and interventions, ordering and review of laboratory studies, ordering and review of radiographic studies, pulse oximetry and re-evaluation of patient's condition.    Fredia Sorrow, MD 07/04/16 1830    Fredia Sorrow, MD 07/04/16 [phone]

## 2016-07-04 NOTE — H&P (Signed)
Cath Lab Visit (complete for each Cath Lab visit)  Clinical Evaluation Leading to the Procedure:   ACS: Yes.    Non-ACS:    Anginal Classification: CCS IV  Anti-ischemic medical therapy: No Therapy  Non-Invasive Test Results: No non-invasive testing performed  Prior CABG: No previous CABG

## 2016-07-04 NOTE — ED Triage Notes (Signed)
Pt reports bending over in the dressing room to try on pants and having a syncopal episode.  Noted to have bruising to left cheek and left arm.  No  Bleeding noted. Denies pain.  Pt does appear pale in triage.

## 2016-07-04 NOTE — ED Notes (Signed)
Cardiac monitor showed 10 beat run of v-tach (?artifact). Monitor strip given to EDP Zackowski to review. Pt asymptomatic during episode

## 2016-07-04 NOTE — H&P (Signed)
Cardiology History & Physical    Patient ID: [name] MRN: [id number], DOB: 1946-11-10 Date of Encounter: 07/04/2016, 8:01 PM Primary Physician: Marjorie Smolder, MD Primary Cardiologist: new  Chief Complaint: syncope Reason for Admission: syncope, complete heart block, runs of VT Requesting MD: Dr. Venita Sheffield  HPI: Ms. Spratling is a 70 y/o F with history of anxiety, depression, breast CA, HTN, habitual alcohol intake (3 beers daily), RA, arthritis who presented to [facility] upon transfer from [place] with complete heart block. She was in her USOH today while trying on clothes at [employer]. She leaned over while trying on pants and the next thing she knew she was on the floor. Someone heard her fall and called for help and got her up. She drove home and called her PCP who recommended she go to urgent care. From [facility] she was sent to [place] who discovered her to be in complete heart block with LBBB, HR 30s-40s, also with runs of VT. She received IV amiodarone for frequent runs of VT prior to arrival here. She has had preserved BP and no further episodes of syncope since under medical care. She denies any recent or current chest pain or prior episodes of syncope. She has chronic mild dyspnea which she attributes to asthma, but no change in this recently. Troponin 0.03-0.03, Hgb 12.2, Na 133, K 4.2, LFTs OK. CXR with marginally more prominent interstitial markings L>R. Upon arrival to [facility] she is in a narrow QRS but still complete heart block.  Past Medical History:  Diagnosis Date  . Anxiety   . Asthma   . Breast cancer ([place])    left breast  . Depression   . Hypertension   . RA (rheumatoid arthritis) ([place])   . Spinal stenosis      Surgical History:  Past Surgical History:  Procedure Laterality Date  . BREAST LUMPECTOMY  2005   left  . LAMINECTOMY  1966  . LYMPHECTOMY  2005     Home Meds: Prior to Admission medications   Medication Sig Start Date End Date  Taking? Authorizing Provider  albuterol (PROVENTIL HFA;VENTOLIN HFA) 108 (90 Base) MCG/ACT inhaler Inhale into the lungs every 6 (six) hours as needed for wheezing or shortness of breath.   Yes Historical Provider, MD  ALPRAZolam (NIRAVAM) 0.25 MG dissolvable tablet Take 0.25 mg by mouth at bedtime as needed for anxiety.   Yes Historical Provider, MD  hydroxychloroquine (PLAQUENIL) 200 MG tablet Take by mouth daily.   Yes Historical Provider, MD  losartan-hydrochlorothiazide (HYZAAR) 100-12.5 MG tablet Take 1 tablet by mouth daily.   Yes Historical Provider, MD  methotrexate (RHEUMATREX) 2.5 MG tablet Take 2.5 mg by mouth once a week. Caution:Chemotherapy. Protect from light.   Yes Historical Provider, MD  aspirin 81 MG tablet Take 81 mg by mouth daily.      Historical Provider, MD  budesonide-formoterol (SYMBICORT) 160-4.5 MCG/ACT inhaler Inhale 2 puffs into the lungs 2 (two) times daily.      Historical Provider, MD  calcium carbonate (OS-CAL) 600 MG TABS Take 600 mg by mouth 2 (two) times daily with a meal.      Historical Provider, MD  cholecalciferol (VITAMIN D) 1000 UNITS tablet Take 1,000 Units by mouth daily.      Historical Provider, MD  citalopram (CELEXA) 40 MG tablet Take 40 mg by mouth daily.      Historical Provider, MD  GLUCOSAMINE HCL PO Take by mouth.      Historical  Provider, MD  omeprazole (PRILOSEC) 20 MG capsule take 1 capsule by mouth 30 TO 60 MINUTES BEFORE FIRST AND LAST MEALS OF THE DAY 04/09/11   Tanda Rockers, MD  oxyCODONE-acetaminophen (PERCOCET) 5-325 MG per tablet Ad lib. 08/22/11   Historical Provider, MD    Allergies: No Known Allergies  Social History   Social History  . Marital status: Married    Spouse name: N/A  . Number of children: N/A  . Years of education: N/A   Occupational History  . Not on file.   Social History Main Topics  . Smoking status: Former Research scientist (life sciences)  . Smokeless tobacco: Never Used     Comment: quit 1995  . Alcohol use 12.6 oz/week     21 Cans of beer per week     Comment: 3 DAILY  . Drug use: No  . Sexual activity: Not on file   Other Topics Concern  . Not on file   Social History Narrative  . No narrative on file     Family History  Problem Relation Age of Onset  . Stroke Mother   . Cancer Father     colon?  . Cancer Paternal Aunt   . Cancer Paternal Uncle     Review of Systems: All other systems reviewed and are otherwise negative except as noted above.  Labs:   Lab Results  Component Value Date   WBC 8.4 07/04/2016   HGB 12.2 07/04/2016   HCT 34.7 (L) 07/04/2016   MCV 94.6 07/04/2016   PLT 251 07/04/2016    Recent Labs Lab 07/04/16 1600  NA 133*  K 4.2  CL 101  CO2 26  BUN 20  CREATININE 0.86  CALCIUM 8.6*  PROT 6.1*  BILITOT 0.6  ALKPHOS 51  ALT 19  AST 27  GLUCOSE 97    Recent Labs  07/04/16 1600 07/04/16 1900  TROPONINI 0.03* 0.03*   No results found for: CHOL, HDL, LDLCALC, TRIG No results found for: DDIMER  Radiology/Studies:  Dg Chest Port 1 View  Result Date: 07/04/2016 CLINICAL DATA:  Syncope.  History of breast carcinoma EXAM: PORTABLE CHEST 1 VIEW COMPARISON:  June 13, 2015 FINDINGS: The interstitium is mildly prominent in a generalized manner, likely due to chronic inflammatory type change. There is no frank edema or consolidation. Heart size and pulmonary vascularity are normal. No adenopathy. There are surgical clips in left axillary region. No bone lesions are evident. IMPRESSION: Surgical clips in left axilla. Interstitial thickening diffusely without edema or consolidation. Interstitial markings are marginally more prominent on the left than on the right, a finding that may be due to residua from previous radiation therapy for breast carcinoma on the left. No adenopathy. Electronically Signed   By: Lowella Grip III M.D.   On: 07/04/2016 16:30   Wt Readings from Last 3 Encounters:  07/04/16 150 lb (68 kg)  09/09/11 153 lb 6.4 oz (69.6 kg)  08/29/10 149 lb  2.1 oz (67.6 kg)    EKG: 3rd degree heart block 43bpm, LBBB F/u EKG showed narrow complex CHB with RBBB-type pattern  Physical Exam: Blood pressure (!) 133/119, pulse (!) 38, temperature 97.7 F (36.5 C), temperature source Oral, resp. rate 16, height 5\' 2"  (1.575 m), weight 150 lb (68 kg), SpO2 98 %. Body mass index is 27.44 kg/m. General: Well developed, well nourished WF in no acute distress. Head: Normocephalic, atraumatic, sclera non-icteric, no xanthomas, nares are without discharge.  Neck: Negative for carotid bruits. JVD not elevated.  Lungs: Clear bilaterally to auscultation without wheezes, rales, or rhonchi. Breathing is unlabored. Heart: Bradycardic rate and regular rhythm with S1 S2. No murmurs, rubs, or gallops appreciated. Abdomen: Soft, non-tender, non-distended with normoactive bowel sounds. No hepatomegaly. No rebound/guarding. No obvious abdominal masses. Msk:  Strength and tone appear normal for age. Extremities: No clubbing or cyanosis. No edema.  Distal pedal pulses are 2+ and equal bilaterally. Neuro: Alert and oriented X 3. No focal deficit. No facial asymmetry. Moves all extremities spontaneously. Psych:  Responds to questions appropriately with a normal affect.    Assessment and Plan  70 y/o F with history of anxiety, depression, breast CA, HTN, habitual alcohol intake (3 beers daily), RA, arthritis presented with syncope and was found to have complete heart block, also having runs of sustained VT.  1. Complete heart block/VT - pt taken urgently down for temp wire due to increasing runs of VT. Admit, check echo, check thyroid, cycle troponins. No AVN blocking agents on board so will need EP to see in AM. Given VT will also proceed with LHC. Risks and benefits of cardiac catheterization/temp wire have been discussed with the patient.  These include bleeding, infection, kidney damage, stroke, heart attack, death.  The patient understands these risks and is willing to  proceed.   2. Habitual alcohol use - counseled on cutting down - obviously this was not a long discussion. Hold off on CIWA given her ventricular arrhythmias so we do not confuse any issues with AMS/arrhythmia. If arrhythmias settle down would consider initiation in AM.  3. HTN - hold antihypertensives until we observe rhythm further.  Signed, Charlie Pitter PA-C 07/04/2016, 8:01 PM Pager: [phone]   Attending note:  Patient seen and examined. Reviewed records and discussed the case with Ms. Purcell Mouton. Ms. Mielnik was accepted in transfer from [place] by Dr. Claiborne Billings with recently diagnosed symptomatic complete heart block. She states that she was out shopping trying on clothes when she had the sudden development of lightheadedness and ultimately frank syncope, woke up on the floor in the dressing room. She denies having any sense of chest discomfort or shortness of breath preceding this. Initial ECG in the ER showed complete heart block with left bundle branch block and heart rate around 40. She was otherwise hemodynamic stable in terms of blood pressure. Initial troponin I level 0.03. She has history of hypertension, not on any AV nodal blockers at home. She drinks 3 beers daily, no obvious history of cardiomyopathy. She does have a history of breast cancer, not clear that she had any cardiotoxic chemotherapy however with prior lumpectomy. While in the ER in [facility], she was noted to have some bursts of NSVT, was given an amiodarone bolus but not started on an infusion. She was transported to the CCU at [facility].  On my evaluation the patient appeared comfortable, not having any active chest pain. Follow-up ECG showed a narrow complex escape with complete heart block and heart rate still around 40. Systolic blood pressure [id number]. She subsequently developed recurring episodes of NSVT by telemetry. Lungs clear without labored breathing. Cardiac exam reveals a slow regular rhythm, no  significant murmur, no peripheral edema. Lab work shows potassium 4.2, creatinine 0.86, normal LFTs, hemoglobin 12.2, platelets 251.  Case discussed with Dr. Pernell Dupre and we reviewed the available tracings and telemetry strips. VT could be bradycardia induced, no obvious chest pain to suggest ACS but still a possibility. Given recurring events and changing QRS  morphology with continued complete heart block, decision was to take her to the cardiac catheterization lab for coronary angiography and placement of a temporary pacing wire. Further plans based on findings and clinical response. Continue to cycle cardiac markers and obtain a follow-up echocardiogram to assess LVEF.  Satira Sark, M.D., F.A.C.C.

## 2016-07-04 NOTE — ED Notes (Signed)
MD notified of pt's HR and rhythm

## 2016-07-04 NOTE — ED Notes (Signed)
Discussed with Wallie Char, MD about Amiodorone being contraindicated in 3rd degree block. MD wanted to continue with amiodorone bolus stating that symptomatic runs of VT was of higher priority. Pt having lightheadedness and palpitations during runs of VT.

## 2016-07-05 ENCOUNTER — Encounter (HOSPITAL_COMMUNITY)
Admission: EM | Disposition: A | Discharge status: Home / Self Care | Payer: Self-pay | Source: Home / Self Care | Attending: Cardiovascular Disease

## 2016-07-05 ENCOUNTER — Encounter (HOSPITAL_COMMUNITY): Payer: Self-pay | Admitting: Interventional Cardiology

## 2016-07-05 ENCOUNTER — Inpatient Hospital Stay (HOSPITAL_COMMUNITY): Payer: Medicare Other

## 2016-07-05 DIAGNOSIS — I442 Atrioventricular block, complete: Secondary | ICD-10-CM

## 2016-07-05 DIAGNOSIS — I5022 Chronic systolic (congestive) heart failure: Secondary | ICD-10-CM

## 2016-07-05 DIAGNOSIS — R55 Syncope and collapse: Secondary | ICD-10-CM

## 2016-07-05 HISTORY — PX: EP IMPLANTABLE DEVICE: SHX172B

## 2016-07-05 LAB — LIPID PANEL
Cholesterol: 166 mg/dL (ref 0–200)
HDL: 54 mg/dL (ref >40)
LDL CALC: 94 mg/dL (ref 0–99)
TRIGLYCERIDES: 88 mg/dL (ref <150)
Total CHOL/HDL Ratio: 3.1 RATIO
VLDL: 18 mg/dL (ref 0–40)

## 2016-07-05 LAB — TROPONIN I
TROPONIN I: 2.01 ng/mL — AB (ref <0.03)
Troponin I: 4.86 ng/mL

## 2016-07-05 LAB — COMPREHENSIVE METABOLIC PANEL
ALBUMIN: 3 g/dL — AB (ref 3.5–5.0)
ALK PHOS: 46 U/L (ref 38–126)
ALT: 17 U/L (ref 14–54)
AST: 33 U/L (ref 15–41)
Anion gap: 8 (ref 5–15)
BILIRUBIN TOTAL: 0.7 mg/dL (ref 0.3–1.2)
BUN: 10 mg/dL (ref 6–20)
CO2: 22 mmol/L (ref 22–32)
Calcium: 8.2 mg/dL — ABNORMAL LOW (ref 8.9–10.3)
Chloride: 106 mmol/L (ref 101–111)
Creatinine, Ser: 0.6 mg/dL (ref 0.44–1.00)
GFR calc Af Amer: >60 mL/min (ref >60)
GFR calc non Af Amer: >60 mL/min (ref >60)
GLUCOSE: 111 mg/dL — AB (ref 65–99)
POTASSIUM: 3.4 mmol/L — AB (ref 3.5–5.1)
SODIUM: 136 mmol/L (ref 135–145)
TOTAL PROTEIN: 5.4 g/dL — AB (ref 6.5–8.1)

## 2016-07-05 LAB — MRSA PCR SCREENING: MRSA by PCR: NEGATIVE

## 2016-07-05 LAB — CBC
HEMATOCRIT: 34.1 % — AB (ref 36.0–46.0)
Hemoglobin: 11.7 g/dL — ABNORMAL LOW (ref 12.0–15.0)
MCH: 32.1 pg (ref 26.0–34.0)
MCHC: 34.3 g/dL (ref 30.0–36.0)
MCV: 93.4 fL (ref 78.0–100.0)
Platelets: 232 10*3/uL (ref 150–400)
RBC: 3.65 MIL/uL — ABNORMAL LOW (ref 3.87–5.11)
RDW: 14.2 % (ref 11.5–15.5)
WBC: 6.9 10*3/uL (ref 4.0–10.5)

## 2016-07-05 LAB — ECHOCARDIOGRAM COMPLETE
HEIGHTINCHES: 62 in
Weight: 2443.2 oz

## 2016-07-05 SURGERY — PACEMAKER IMPLANT
Anesthesia: LOCAL

## 2016-07-05 MED ORDER — CHLORHEXIDINE GLUCONATE 4 % EX LIQD
60.0000 mL | Freq: Once | CUTANEOUS | Status: AC
  Administered 2016-07-05: 4 via TOPICAL

## 2016-07-05 MED ORDER — SODIUM CHLORIDE 0.9 % IV SOLN
250.0000 mL | INTRAVENOUS | Status: DC
Start: 2016-07-05 — End: 2016-07-05

## 2016-07-05 MED ORDER — HEPARIN (PORCINE) IN NACL 2-0.9 UNIT/ML-% IJ SOLN
INTRAMUSCULAR | Status: AC
  Filled 2016-07-05: qty 500

## 2016-07-05 MED ORDER — CHLORHEXIDINE GLUCONATE 4 % EX LIQD
CUTANEOUS | Status: AC
  Filled 2016-07-05: qty 15

## 2016-07-05 MED ORDER — SODIUM CHLORIDE 0.9 % IV SOLN
INTRAVENOUS | Status: DC
  Administered 2016-07-05: 14:00:00 via INTRAVENOUS

## 2016-07-05 MED ORDER — SODIUM CHLORIDE 0.9 % IR SOLN
Status: AC
  Filled 2016-07-05: qty 2

## 2016-07-05 MED ORDER — FENTANYL CITRATE (PF) 100 MCG/2ML IJ SOLN
INTRAMUSCULAR | Status: AC
  Filled 2016-07-05: qty 2

## 2016-07-05 MED ORDER — POTASSIUM CHLORIDE CRYS ER 20 MEQ PO TBCR
30.0000 meq | EXTENDED_RELEASE_TABLET | Freq: Once | ORAL | Status: AC
  Administered 2016-07-05: 30 meq via ORAL
  Filled 2016-07-05: qty 1

## 2016-07-05 MED ORDER — METHOTREXATE 2.5 MG PO TABS
2.5000 mg | ORAL_TABLET | ORAL | Status: DC
  Filled 2016-07-05: qty 1

## 2016-07-05 MED ORDER — ACETAMINOPHEN 325 MG PO TABS: 325.0000 mg | ORAL_TABLET | ORAL | Status: DC | PRN

## 2016-07-05 MED ORDER — CHLORHEXIDINE GLUCONATE 4 % EX LIQD
CUTANEOUS | Status: AC
  Filled 2016-07-05: qty 45

## 2016-07-05 MED ORDER — MORPHINE SULFATE (PF) 2 MG/ML IV SOLN
INTRAVENOUS | Status: AC
  Filled 2016-07-05: qty 1

## 2016-07-05 MED ORDER — HEPARIN (PORCINE) IN NACL 2-0.9 UNIT/ML-% IJ SOLN
INTRAMUSCULAR | Status: DC | PRN
  Administered 2016-07-05: 16:00:00

## 2016-07-05 MED ORDER — LIDOCAINE HCL (PF) 1 % IJ SOLN
INTRAMUSCULAR | Status: AC
  Filled 2016-07-05: qty 60

## 2016-07-05 MED ORDER — CETYLPYRIDINIUM CHLORIDE 0.05 % MT LIQD
7.0000 mL | Freq: Two times a day (BID) | OROMUCOSAL | Status: DC
  Administered 2016-07-05 – 2016-07-07 (×4): 7 mL via OROMUCOSAL

## 2016-07-05 MED ORDER — ONDANSETRON HCL 4 MG/2ML IJ SOLN
4.0000 mg | Freq: Four times a day (QID) | INTRAMUSCULAR | Status: DC | PRN
  Administered 2016-07-05: 4 mg via INTRAVENOUS

## 2016-07-05 MED ORDER — IOPAMIDOL (ISOVUE-370) INJECTION 76%
INTRAVENOUS | Status: AC
  Filled 2016-07-05: qty 50

## 2016-07-05 MED ORDER — PERFLUTREN LIPID MICROSPHERE
1.0000 mL | INTRAVENOUS | Status: AC | PRN
  Administered 2016-07-05: 2 mL via INTRAVENOUS
  Filled 2016-07-05: qty 10

## 2016-07-05 MED ORDER — MIDAZOLAM HCL 5 MG/5ML IJ SOLN
INTRAMUSCULAR | Status: DC | PRN
  Administered 2016-07-05 (×5): 1 mg via INTRAVENOUS

## 2016-07-05 MED ORDER — CHLORHEXIDINE GLUCONATE 4 % EX LIQD: 60.0000 mL | Freq: Once | CUTANEOUS | Status: AC

## 2016-07-05 MED ORDER — SODIUM CHLORIDE 0.9% FLUSH: 3.0000 mL | INTRAVENOUS | Status: DC | PRN

## 2016-07-05 MED ORDER — FENTANYL CITRATE (PF) 100 MCG/2ML IJ SOLN
INTRAMUSCULAR | Status: DC | PRN
  Administered 2016-07-05 (×5): 12.5 ug via INTRAVENOUS

## 2016-07-05 MED ORDER — HYDROXYCHLOROQUINE SULFATE 200 MG PO TABS
200.0000 mg | ORAL_TABLET | ORAL | Status: DC
  Administered 2016-07-05: 200 mg via ORAL
  Filled 2016-07-05: qty 1

## 2016-07-05 MED ORDER — CEFAZOLIN SODIUM-DEXTROSE 2-4 GM/100ML-% IV SOLN
INTRAVENOUS | Status: AC
  Filled 2016-07-05: qty 100

## 2016-07-05 MED ORDER — SODIUM CHLORIDE 0.9% FLUSH: 3.0000 mL | Freq: Two times a day (BID) | INTRAVENOUS | Status: DC

## 2016-07-05 MED ORDER — LIDOCAINE HCL (PF) 1 % IJ SOLN
INTRAMUSCULAR | Status: DC | PRN
  Administered 2016-07-05: 50 mL via INTRADERMAL

## 2016-07-05 MED ORDER — CEFAZOLIN SODIUM-DEXTROSE 2-4 GM/100ML-% IV SOLN
2.0000 g | INTRAVENOUS | Status: AC
  Administered 2016-07-05: 2 g via INTRAVENOUS
  Filled 2016-07-05: qty 100

## 2016-07-05 MED ORDER — CEFAZOLIN IN D5W 1 GM/50ML IV SOLN
1.0000 g | Freq: Four times a day (QID) | INTRAVENOUS | Status: AC
  Administered 2016-07-05 – 2016-07-06 (×3): 1 g via INTRAVENOUS
  Filled 2016-07-05 (×3): qty 50

## 2016-07-05 MED ORDER — ONDANSETRON HCL 4 MG/2ML IJ SOLN
INTRAMUSCULAR | Status: AC
Start: 2016-07-05 — End: 2016-07-05
  Filled 2016-07-05: qty 2

## 2016-07-05 MED ORDER — MIDAZOLAM HCL 5 MG/5ML IJ SOLN
INTRAMUSCULAR | Status: AC
  Filled 2016-07-05: qty 5

## 2016-07-05 MED ORDER — SODIUM CHLORIDE 0.9 % IR SOLN
80.0000 mg | Status: AC
  Administered 2016-07-05: 80 mg
  Filled 2016-07-05: qty 2

## 2016-07-05 MED ORDER — IOPAMIDOL (ISOVUE-370) INJECTION 76%
INTRAVENOUS | Status: DC | PRN
  Administered 2016-07-05: 45 mL via INTRAVENOUS

## 2016-07-05 MED ORDER — CLONAZEPAM 0.5 MG PO TABS
0.5000 mg | ORAL_TABLET | Freq: Two times a day (BID) | ORAL | Status: DC | PRN
  Administered 2016-07-05 – 2016-07-06 (×2): 0.5 mg via ORAL
  Filled 2016-07-05 (×2): qty 1

## 2016-07-05 MED ORDER — PERFLUTREN LIPID MICROSPHERE
INTRAVENOUS | Status: AC
  Administered 2016-07-05: 2 mL via INTRAVENOUS
  Filled 2016-07-05: qty 10

## 2016-07-05 SURGICAL SUPPLY — 17 items
CABLE SURGICAL S-101-97-12 (CABLE) ×2 IMPLANT
CATH ATTAIN COM SURV 6250V-MB2 (CATHETERS) ×2 IMPLANT
CATH ATTAIN SEL SURV 6248V-90 (CATHETERS) ×2 IMPLANT
CATH ATTAIN SELECT 6238TEL (CATHETERS) ×2 IMPLANT
CATH HEX JOS 2-5-2 65CM 6F REP (CATHETERS) ×2 IMPLANT
GUIDEWIRE ANGLED .035X150CM (WIRE) ×2 IMPLANT
KIT ESSENTIALS PG (KITS) ×2 IMPLANT
LEAD ATTAIN ABILITY 4396-88CM (Lead) ×2 IMPLANT
LEAD CAPSURE NOVUS 45CM (Lead) ×2 IMPLANT
LEAD CAPSURE NOVUS 5076-52CM (Lead) ×2 IMPLANT
PAD DEFIB LIFELINK (PAD) ×2 IMPLANT
PPM CONSULTA CRT-P C4TR01 (Pacemaker) ×2 IMPLANT
SHEATH CLASSIC 7F (SHEATH) ×4 IMPLANT
SHEATH CLASSIC 9.5F (SHEATH) ×2 IMPLANT
SLITTER 6232ADJ (MISCELLANEOUS) ×2 IMPLANT
TRAY PACEMAKER INSERTION (PACKS) ×2 IMPLANT
WIRE ACUITY WHISPER EDS 4648 (WIRE) ×6 IMPLANT

## 2016-07-05 NOTE — Consult Note (Signed)
ELECTROPHYSIOLOGY CONSULT NOTE    Patient ID: [name] MRN: [id number], DOB/AGE: 72-Nov-1947 72 y.o.  Admit date: 07/04/2016 Date of Consult: 07/05/2016  Primary Physician: Marjorie Smolder, MD Primary Cardiologist: Dr. Domenic Polite (new) Requesting MD: Dr. Tamala Julian  Reason for Consultation: complete heart block  HPI: [name] is a 70 y.o. female with PMHx of breast ca, HTN, RA, depression, was admitted to [facility] secondary to syncope, noted in [place].  The patient was also observed to have recurrent episodes of NSVT and brought emergently to the cath lab for LHC and temp pacing wire placement.    The patient was out shopping when she bent to try on some clothes suddenly fainted. She woke quickly and drove herself home. She called her PCP who recommended she go to urgent care. From [facility] she was sent to [place] who discovered her to be in complete heart block with LBBB, HR 30s-40s, also with runs of VT. She received IV amiodarone for frequent runs of VT prior to arrival here.  The patient denies any pre-hospital episodes of CP, palpitations, no previous fainting hx.  No SOB.  Outside of being "stuck in bed" she is feeling well this morning, no ongoing CP.  No further syncope.  Admit LABS: K+ 4.2 >> 3.4 BUN/Creat 20/0.86 Trop 0.03 x2, >> 2.01 H/H 12/34 WBC 8.4 Plts 251  Past Medical History:  Diagnosis Date  . Anxiety   . Asthma   . Breast cancer ([place])    left breast  . Depression   . Habitual alcohol use   . Hypertension   . RA (rheumatoid arthritis) ([place])   . Spinal stenosis      Surgical History:  Past Surgical History:  Procedure Laterality Date  . BREAST LUMPECTOMY  2005   left  . CARDIAC CATHETERIZATION N/A 07/04/2016   Procedure: Left Heart Cath and Coronary Angiography;  Surgeon: Belva Crome, MD;  Location: [place] CV LAB;  Service: Cardiovascular;  Laterality: N/A;  . CARDIAC CATHETERIZATION N/A 07/04/2016   Procedure: Temporary  Pacemaker;  Surgeon: Belva Crome, MD;  Location: [place] CV LAB;  Service: Cardiovascular;  Laterality: N/A;  . [place]  . LYMPHECTOMY  2005     Prescriptions Prior to Admission  Medication Sig Dispense Refill Last Dose  . albuterol (PROVENTIL HFA;VENTOLIN HFA) 108 (90 Base) MCG/ACT inhaler Inhale into the lungs every 6 (six) hours as needed for wheezing or shortness of breath.     . ALPRAZolam (NIRAVAM) 0.25 MG dissolvable tablet Take 0.25 mg by mouth at bedtime as needed for anxiety.     . hydroxychloroquine (PLAQUENIL) 200 MG tablet Take by mouth daily.     Marland Kitchen losartan-hydrochlorothiazide (HYZAAR) 100-12.5 MG tablet Take 1 tablet by mouth daily.     . methotrexate (RHEUMATREX) 2.5 MG tablet Take 2.5 mg by mouth once a week. Caution:Chemotherapy. Protect from light.     Marland Kitchen aspirin 81 MG tablet Take 81 mg by mouth daily.     Taking  . budesonide-formoterol (SYMBICORT) 160-4.5 MCG/ACT inhaler Inhale 2 puffs into the lungs 2 (two) times daily.     Taking  . calcium carbonate (OS-CAL) 600 MG TABS Take 600 mg by mouth 2 (two) times daily with a meal.     Taking  . cholecalciferol (VITAMIN D) 1000 UNITS tablet Take 1,000 Units by mouth daily.     Taking  . citalopram (CELEXA) 40 MG tablet Take 40 mg by mouth daily.  Taking  . GLUCOSAMINE HCL PO Take by mouth.     Taking  . omeprazole (PRILOSEC) 20 MG capsule take 1 capsule by mouth 30 TO 60 MINUTES BEFORE FIRST AND LAST MEALS OF THE DAY 60 capsule 0 Taking  . oxyCODONE-acetaminophen (PERCOCET) 5-325 MG per tablet Ad lib.   Taking    Inpatient Medications:  . aspirin EC  81 mg Oral Daily  . calcium carbonate  1,250 mg Oral BID WC  . cholecalciferol  1,000 Units Oral Daily  . citalopram  40 mg Oral Daily  . heparin  5,000 Units Subcutaneous Q8H  . mometasone-formoterol  2 puff Inhalation BID  . pantoprazole  40 mg Oral Daily  . sodium chloride flush  3 mL Intravenous Q12H    Allergies: No Known Allergies  Social History     Social History  . Marital status: Married    Spouse name: N/A  . Number of children: N/A  . Years of education: N/A   Occupational History  . Not on file.   Social History Main Topics  . Smoking status: Former Research scientist (life sciences)  . Smokeless tobacco: Never Used     Comment: quit 1995  . Alcohol use 12.6 oz/week    21 Cans of beer per week     Comment: 3 DAILY  . Drug use: No  . Sexual activity: Not on file   Other Topics Concern  . Not on file   Social History Narrative  . No narrative on file     Family History  Problem Relation Age of Onset  . Stroke Mother   . Cancer Father     colon?  . Cancer Paternal Aunt   . Cancer Paternal Uncle      Review of Systems: All other systems reviewed and are otherwise negative except as noted above.  Physical Exam: Vitals:   07/05/16 0400 07/05/16 0500 07/05/16 0600 07/05/16 0700  BP:      Pulse: 69 70 70 68  Resp: 14 10 19 10   Temp: 98 F (36.7 C)     TempSrc: Oral     SpO2: 100% 100% 100% 100%  Weight:      Height:        GEN- The patient is well appearing, alert and oriented x 3 today.   HEENT: normocephalic, atraumatic; sclera clear, conjunctiva pink; hearing intact; oropharynx clear; neck supple, no JVP Lymph- no cervical lymphadenopathy Lungs- Clear to ausculation bilaterally, normal work of breathing.  No wheezes, rales, rhonchi Heart- Regular rate and rhythm (paced), no significant murmurs, rubs or gallops, PMI not laterally displaced GI- soft, non-tender, non-distended, bowel sounds present Extremities- no clubbing, cyanosis, or edema, temp pacer site is stable MS- no significant deformity or atrophy Skin- warm and dry, no rash or lesion Psych- euthymic mood, full affect Neuro- no gross deficits observed  Labs:   Lab Results  Component Value Date   WBC 6.9 07/05/2016   HGB 11.7 (L) 07/05/2016   HCT 34.1 (L) 07/05/2016   MCV 93.4 07/05/2016   PLT 232 07/05/2016    Recent Labs Lab 07/05/16 0312  NA 136   K 3.4*  CL 106  CO2 22  BUN 10  CREATININE 0.60  CALCIUM 8.2*  PROT 5.4*  BILITOT 0.7  ALKPHOS 46  ALT 17  AST 33  GLUCOSE 111*      Radiology/Studies:  Dg Chest Port 1 View Result Date: 07/04/2016 CLINICAL DATA:  Syncope.  History of breast carcinoma EXAM: PORTABLE CHEST 1  VIEW COMPARISON:  June 13, 2015 FINDINGS: The interstitium is mildly prominent in a generalized manner, likely due to chronic inflammatory type change. There is no frank edema or consolidation. Heart size and pulmonary vascularity are normal. No adenopathy. There are surgical clips in left axillary region. No bone lesions are evident. IMPRESSION: Surgical clips in left axilla. Interstitial thickening diffusely without edema or consolidation. Interstitial markings are marginally more prominent on the left than on the right, a finding that may be due to residua from previous radiation therapy for breast carcinoma on the left. No adenopathy. Electronically Signed   By: Lowella Grip III M.D.   On: 07/04/2016 16:30    EKG:  CHB, 43bpm, LBBB >> V paced EKGs TELEMETRY:  CHB 30's-40's with runs of NSVT >> V pacing, continues with CHB underneath pacer this morning  07/04/16: LHC, Dr. Tamala Julian (emergent) Conclusion    Mid Cx to Dist Cx lesion, 60 %stenosed.  Mid LAD 100 % thrombotic occlusion. This was likely the result of a catheter-based thromboembolic event.  Post intervention with catheter-based thrombectomy, there is a 10% residual stenosis.  There is mild left ventricular systolic dysfunction.  LV end diastolic pressure is normal.  The left ventricular ejection fraction is 45-50% by visual estimate.    High-grade AV block with syncope and polymorphic ventricular tachycardia (nonsustained - possibly bradycardia dependent). Heavy mitral annular calcification suggests that AV block is chronic and degenerative in nature. The patient will likely require a permanent pacemaker.  Temporary pacemaker implantation  via right femoral approach.  Total occlusion of the mid LAD likely related to a thromboembolic event complicating catheterization. Onset of chest discomfort was temporarily related to the first left coronary angiogram.  Successful catheter-based thrombectomy of the mid LAD reducing 100% stenosis to less than 10% with TIMI grade 3 flow. Stenting was not required.  Otherwise widely patent/normal coronary arteries.  Mid anterior wall hypokinesis with EF 40-50%. (Interestingly, the ventriculogram was performed prior to left coronary angiography).  RECOMMENDATIONS:   Aggrastat 18 hours.  Aspirin and Brilinta were loaded in the cath lab.  Consider permanent pacemaker tomorrow afternoon.  Temporary transvenous pacemaker was secured in place and hopefully will be removed within the next 24-48 hours.  Trend cardiac markers.     Echo this admission is pending   05/20/06 echocardiogram SUMMARY - Overall left ventricular systolic function was normal. Left    ventricular ejection fraction was estimated , range being 55    % to 60 %. This study was inadequate for the evaluation of    left ventricular regional wall motion.  Assessment and Plan:   1. Syncope >> CHB, NSVT episodes     S/p emergent LHC and temp pacing wire last evening     No rate limiting/nodal blocking meds     Remains in CHB under pacer this morning     Planned for PPM this afternoon     Dr. Lovena Le discussed with Dr. Tamala Julian, [place] to stop/hold Brilinta for pacer (will complete angiomax, continue ASA) Dr. Lovena Le discussed with the patient, risks/beneifts PPM procedure, she wishes to proceed.  2. LHC with thrombus to LAD, likely related to a thromboembolic event complicating catheterization     Successful catheter-based thrombectomy of the mid LAD reducing 100% stenosis to less than 10% with TIMI grade 3 flow. Stenting was not required     Trop is 2.01 this morning, no ongoing CP  2. Chronic ETOH use 3.  HTN   Signed, Tommye Standard, Hershal Coria 07/05/2016  8:24 AM       EP Attending  Patient seen and examined. Agree with above. The patient has had a 2 D echo demonstrating an EF of 35%. Unclear whether or not this is permanent or not but she will have essentially 100% RV pacing. I have discussed the risks/benefits/goals/expectations of PPM insertion and she wishes to proceed.  Mikle Bosworth.D.

## 2016-07-05 NOTE — Progress Notes (Signed)
Confirmed with Renee ok to omit LUE PIV d/t restricted extremity.

## 2016-07-05 NOTE — Progress Notes (Signed)
*   Echocardiogram 2D Echocardiogram with Definity has been performed.  Darlina Sicilian M 07/05/2016, 11:17 AM

## 2016-07-05 NOTE — Interval H&P Note (Signed)
History and Physical Interval Note:  07/05/2016 4:03 PM  [name]  has presented today for surgery, with the diagnosis of heart block  The various methods of treatment have been discussed with the patient and family. After consideration of risks, benefits and other options for treatment, the patient has consented to  Procedure(s): Pacemaker Implant (N/A) as a surgical intervention .  The patient's history has been reviewed, patient examined, no change in status, stable for surgery.  I have reviewed the patient's chart and labs.  Questions were answered to the patient's satisfaction.     Cristopher Peru

## 2016-07-05 NOTE — Progress Notes (Addendum)
Site area: RFA/RFV Site Prior to Removal:  Level 0 Pressure Applied For:30 min Manual: yes   Patient Status During Pull:  stable Post Pull Site:  Level 0 Post Pull Instructions Given:yes   Post Pull Pulses Present:palpable  Dressing Applied: tegaderm  Bedrest begins @ 1915 till 2315 Comments:

## 2016-07-05 NOTE — H&P (View-Only) (Signed)
ELECTROPHYSIOLOGY CONSULT NOTE    Patient ID: [name] MRN: [id number], DOB/AGE: [birth year] 70 y.o.  Admit date: 07/04/2016 Date of Consult: 07/05/2016  Primary Physician: Marjorie Smolder, MD Primary Cardiologist: Dr. Domenic Polite (new) Requesting MD: Dr. Tamala Julian  Reason for Consultation: complete heart block  HPI: [name] is a 70 y.o. female with PMHx of breast ca, HTN, RA, depression, was admitted to [facility] secondary to syncope, noted in [place].  The patient was also observed to have recurrent episodes of NSVT and brought emergently to the cath lab for LHC and temp pacing wire placement.    The patient was out shopping when she bent to try on some clothes suddenly fainted. She woke quickly and drove herself home. She called her PCP who recommended she go to urgent care. From [facility] she was sent to [place] who discovered her to be in complete heart block with LBBB, HR 30s-40s, also with runs of VT. She received IV amiodarone for frequent runs of VT prior to arrival here.  The patient denies any pre-hospital episodes of CP, palpitations, no previous fainting hx.  No SOB.  Outside of being "stuck in bed" she is feeling well this morning, no ongoing CP.  No further syncope.  Admit LABS: K+ 4.2 >> 3.4 BUN/Creat 20/0.86 Trop 0.03 x2, >> 2.01 H/H 12/34 WBC 8.4 Plts 251  Past Medical History:  Diagnosis Date  . Anxiety   . Asthma   . Breast cancer ([place])    left breast  . Depression   . Habitual alcohol use   . Hypertension   . RA (rheumatoid arthritis) ([place])   . Spinal stenosis      Surgical History:  Past Surgical History:  Procedure Laterality Date  . BREAST LUMPECTOMY  2005   left  . CARDIAC CATHETERIZATION N/A 07/04/2016   Procedure: Left Heart Cath and Coronary Angiography;  Surgeon: Belva Crome, MD;  Location: [place] [facility];  Service: Cardiovascular;  Laterality: N/A;  . CARDIAC CATHETERIZATION N/A 07/04/2016   Procedure: Temporary  Pacemaker;  Surgeon: Belva Crome, MD;  Location: [place] [facility];  Service: Cardiovascular;  Laterality: N/A;  . [place]  . LYMPHECTOMY  2005     Prescriptions Prior to Admission  Medication Sig Dispense Refill Last Dose  . albuterol (PROVENTIL HFA;VENTOLIN HFA) 108 (90 Base) MCG/ACT inhaler Inhale into the lungs every 6 (six) hours as needed for wheezing or shortness of breath.     . ALPRAZolam (NIRAVAM) 0.25 MG dissolvable tablet Take 0.25 mg by mouth at bedtime as needed for anxiety.     . hydroxychloroquine (PLAQUENIL) 200 MG tablet Take by mouth daily.     Marland Kitchen losartan-hydrochlorothiazide (HYZAAR) 100-12.5 MG tablet Take 1 tablet by mouth daily.     . methotrexate (RHEUMATREX) 2.5 MG tablet Take 2.5 mg by mouth once a week. Caution:Chemotherapy. Protect from light.     Marland Kitchen aspirin 81 MG tablet Take 81 mg by mouth daily.     Taking  . budesonide-formoterol (SYMBICORT) 160-4.5 MCG/ACT inhaler Inhale 2 puffs into the lungs 2 (two) times daily.     Taking  . calcium carbonate (OS-CAL) 600 MG TABS Take 600 mg by mouth 2 (two) times daily with a meal.     Taking  . cholecalciferol (VITAMIN D) 1000 UNITS tablet Take 1,000 Units by mouth daily.     Taking  . citalopram (CELEXA) 40 MG tablet Take 40 mg by mouth daily.  Taking  . GLUCOSAMINE HCL PO Take by mouth.     Taking  . omeprazole (PRILOSEC) 20 MG capsule take 1 capsule by mouth 30 TO 60 MINUTES BEFORE FIRST AND LAST MEALS OF THE DAY 60 capsule 0 Taking  . oxyCODONE-acetaminophen (PERCOCET) 5-325 MG per tablet Ad lib.   Taking    Inpatient Medications:  . aspirin EC  81 mg Oral Daily  . calcium carbonate  1,250 mg Oral BID WC  . cholecalciferol  1,000 Units Oral Daily  . citalopram  40 mg Oral Daily  . heparin  5,000 Units Subcutaneous Q8H  . mometasone-formoterol  2 puff Inhalation BID  . pantoprazole  40 mg Oral Daily  . sodium chloride flush  3 mL Intravenous Q12H    Allergies: No Known Allergies  Social History     Social History  . Marital status: Married    Spouse name: N/A  . Number of children: N/A  . Years of education: N/A   Occupational History  . Not on file.   Social History Main Topics  . Smoking status: Former Research scientist (life sciences)  . Smokeless tobacco: Never Used     Comment: quit 1995  . Alcohol use 12.6 oz/week    21 Cans of beer per week     Comment: 3 DAILY  . Drug use: No  . Sexual activity: Not on file   Other Topics Concern  . Not on file   Social History Narrative  . No narrative on file     Family History  Problem Relation Age of Onset  . Stroke Mother   . Cancer Father     colon?  . Cancer Paternal Aunt   . Cancer Paternal Uncle      Review of Systems: All other systems reviewed and are otherwise negative except as noted above.  Physical Exam: Vitals:   07/05/16 0400 07/05/16 0500 07/05/16 0600 07/05/16 0700  BP:      Pulse: 69 70 70 68  Resp: 14 10 19 10   Temp: 98 F (36.7 C)     TempSrc: Oral     SpO2: 100% 100% 100% 100%  Weight:      Height:        GEN- The patient is well appearing, alert and oriented x 3 today.   HEENT: normocephalic, atraumatic; sclera clear, conjunctiva pink; hearing intact; oropharynx clear; neck supple, no JVP Lymph- no cervical lymphadenopathy Lungs- Clear to ausculation bilaterally, normal work of breathing.  No wheezes, rales, rhonchi Heart- Regular rate and rhythm (paced), no significant murmurs, rubs or gallops, PMI not laterally displaced GI- soft, non-tender, non-distended, bowel sounds present Extremities- no clubbing, cyanosis, or edema, temp pacer site is stable MS- no significant deformity or atrophy Skin- warm and dry, no rash or lesion Psych- euthymic mood, full affect Neuro- no gross deficits observed  Labs:   Lab Results  Component Value Date   WBC 6.9 07/05/2016   HGB 11.7 (L) 07/05/2016   HCT 34.1 (L) 07/05/2016   MCV 93.4 07/05/2016   PLT 232 07/05/2016    Recent Labs Lab 07/05/16 0312  NA 136   K 3.4*  CL 106  CO2 22  BUN 10  CREATININE 0.60  CALCIUM 8.2*  PROT 5.4*  BILITOT 0.7  ALKPHOS 46  ALT 17  AST 33  GLUCOSE 111*      Radiology/Studies:  Dg Chest Port 1 View Result Date: 07/04/2016 CLINICAL DATA:  Syncope.  History of breast carcinoma EXAM: PORTABLE CHEST 1  VIEW COMPARISON:  June 13, 2015 FINDINGS: The interstitium is mildly prominent in a generalized manner, likely due to chronic inflammatory type change. There is no frank edema or consolidation. Heart size and pulmonary vascularity are normal. No adenopathy. There are surgical clips in left axillary region. No bone lesions are evident. IMPRESSION: Surgical clips in left axilla. Interstitial thickening diffusely without edema or consolidation. Interstitial markings are marginally more prominent on the left than on the right, a finding that may be due to residua from previous radiation therapy for breast carcinoma on the left. No adenopathy. Electronically Signed   By: Lowella Grip III M.D.   On: 07/04/2016 16:30    EKG:  CHB, 43bpm, LBBB >> V paced EKGs TELEMETRY:  CHB 30's-40's with runs of NSVT >> V pacing, continues with CHB underneath pacer this morning  07/04/16: LHC, Dr. Tamala Julian (emergent) Conclusion    Mid Cx to Dist Cx lesion, 60 %stenosed.  Mid LAD 100 % thrombotic occlusion. This was likely the result of a catheter-based thromboembolic event.  Post intervention with catheter-based thrombectomy, there is a 10% residual stenosis.  There is mild left ventricular systolic dysfunction.  LV end diastolic pressure is normal.  The left ventricular ejection fraction is 45-50% by visual estimate.    High-grade AV block with syncope and polymorphic ventricular tachycardia (nonsustained - possibly bradycardia dependent). Heavy mitral annular calcification suggests that AV block is chronic and degenerative in nature. The patient will likely require a permanent pacemaker.  Temporary pacemaker implantation  via right femoral approach.  Total occlusion of the mid LAD likely related to a thromboembolic event complicating catheterization. Onset of chest discomfort was temporarily related to the first left coronary angiogram.  Successful catheter-based thrombectomy of the mid LAD reducing 100% stenosis to less than 10% with TIMI grade 3 flow. Stenting was not required.  Otherwise widely patent/normal coronary arteries.  Mid anterior wall hypokinesis with EF 40-50%. (Interestingly, the ventriculogram was performed prior to left coronary angiography).  RECOMMENDATIONS:   Aggrastat 18 hours.  Aspirin and Brilinta were loaded in the cath lab.  Consider permanent pacemaker tomorrow afternoon.  Temporary transvenous pacemaker was secured in place and hopefully will be removed within the next 24-48 hours.  Trend cardiac markers.     Echo this admission is pending   05/20/06 echocardiogram SUMMARY - Overall left ventricular systolic function was normal. Left    ventricular ejection fraction was estimated , range being 55    % to 60 %. This study was inadequate for the evaluation of    left ventricular regional wall motion.  Assessment and Plan:   1. Syncope >> CHB, NSVT episodes     S/p emergent LHC and temp pacing wire last evening     No rate limiting/nodal blocking meds     Remains in CHB under pacer this morning     Planned for PPM this afternoon     Dr. Lovena Le discussed with Dr. Tamala Julian, [place] to stop/hold Brilinta for pacer (will complete angiomax, continue ASA) Dr. Lovena Le discussed with the patient, risks/beneifts PPM procedure, she wishes to proceed.  2. LHC with thrombus to LAD, likely related to a thromboembolic event complicating catheterization     Successful catheter-based thrombectomy of the mid LAD reducing 100% stenosis to less than 10% with TIMI grade 3 flow. Stenting was not required     Trop is 2.01 this morning, no ongoing CP  2. Chronic ETOH use 3.  HTN   Signed, [name], [name] 07/05/2016  8:14 AM       EP Attending  Patient seen and examined. Agree with above. The patient has had a 2 D echo demonstrating an EF of 35%. Unclear whether or not this is permanent or not but she will have essentially 100% RV pacing. I have discussed the risks/benefits/goals/expectations of PPM insertion and she wishes to proceed.  [name].D.

## 2016-07-06 ENCOUNTER — Inpatient Hospital Stay (HOSPITAL_COMMUNITY): Payer: Medicare Other

## 2016-07-06 LAB — TROPONIN I: TROPONIN I: 6.37 ng/mL — AB (ref <0.03)

## 2016-07-06 LAB — GLUCOSE, CAPILLARY: Glucose-Capillary: 118 mg/dL — ABNORMAL HIGH (ref 65–99)

## 2016-07-06 MED ORDER — METHOTREXATE 2.5 MG PO TABS
15.0000 mg | ORAL_TABLET | ORAL | Status: DC
  Administered 2016-07-06: 15 mg via ORAL
  Filled 2016-07-06: qty 6

## 2016-07-06 MED ORDER — TICAGRELOR 90 MG PO TABS
90.0000 mg | ORAL_TABLET | Freq: Two times a day (BID) | ORAL | Status: DC
  Administered 2016-07-06 – 2016-07-07 (×2): 90 mg via ORAL
  Filled 2016-07-06 (×3): qty 1

## 2016-07-06 NOTE — Progress Notes (Addendum)
Patient was found shivering and c/o shortness of breath, vital signs were obtained, placed on 02 2L, patient was given additional blankets with the temp of the room turned up, PRN klonopin and tylenol were given, patient now resting quietly in bed, call light within reach will continue to monitor

## 2016-07-06 NOTE — Progress Notes (Signed)
SUBJECTIVE: The patient is doing well today.  At this time, she denies chest pain, shortness of breath, or any new concerns.  Marland Kitchen antiseptic oral rinse  7 mL Mouth Rinse BID  . aspirin EC  81 mg Oral Daily  . calcium carbonate  1,250 mg Oral BID WC  . cholecalciferol  1,000 Units Oral Daily  . citalopram  40 mg Oral Daily  . heparin  5,000 Units Subcutaneous Q8H  . hydroxychloroquine  200 mg Oral 2 times per day on Mon Tue Wed Thu Fri  . methotrexate  15 mg Oral Weekly  . mometasone-formoterol  2 puff Inhalation BID  . pantoprazole  40 mg Oral Daily  . sodium chloride flush  3 mL Intravenous Q12H  . ticagrelor  90 mg Oral BID   . nitroGLYCERIN Stopped (07/05/16 0500)    OBJECTIVE: Physical Exam: Vitals:   07/06/16 0800 07/06/16 1000 07/06/16 1020 07/06/16 1144  BP: (!) 112/41 (!) 118/99  (!) 94/37  Pulse: 92 91 94 82  Resp: 14 19 16 13   Temp: 98 F (36.7 C)   99.2 F (37.3 C)  TempSrc: Oral   Oral  SpO2: (!) 89% 99% 98% 97%  Weight:      Height:        Intake/Output Summary (Last 24 hours) at 07/06/16 1406 Last data filed at 07/06/16 1017  Gross per 24 hour  Intake           680.83 ml  Output              400 ml  Net           280.83 ml    Telemetry reveals sinus rhythm with Biv Pacing  GEN- The patient is well appearing, alert and oriented x 3 today.   Head- normocephalic, atraumatic Eyes-  Sclera clear, conjunctiva pink Ears- hearing intact Oropharynx- clear Neck- supple, no JVP Lymph- no cervical lymphadenopathy Lungs- Clear to ausculation bilaterally, normal work of breathing Heart- Regular rate and rhythm, no murmurs, rubs or gallops, PMI not laterally displaced GI- soft, NT, ND, + BS Extremities- no clubbing, cyanosis, or edema Skin- device pocket is without hematoma  LABS: Basic Metabolic Panel:  Recent Labs  07/04/16 1600 07/05/16 0312  NA 133* 136  K 4.2 3.4*  CL 101 106  CO2 26 22  GLUCOSE 97 111*  BUN 20 10  CREATININE 0.86 0.60    CALCIUM 8.6* 8.2*   Liver Function Tests:  Recent Labs  07/04/16 1600 07/05/16 0312  AST 27 33  ALT 19 17  ALKPHOS 51 46  BILITOT 0.6 0.7  PROT 6.1* 5.4*  ALBUMIN 3.7 3.0*   No results for input(s): LIPASE, AMYLASE in the last 72 hours. CBC:  Recent Labs  07/04/16 1600 07/05/16 0312  WBC 8.4 6.9  NEUTROABS 6.4  --   HGB 12.2 11.7*  HCT 34.7* 34.1*  MCV 94.6 93.4  PLT 251 232   Cardiac Enzymes:  Recent Labs  07/05/16 0312 07/05/16 1415 07/06/16 0052  TROPONINI 2.01* 4.86* 6.37*   BNP: Invalid input(s): POCBNP D-Dimer: No results for input(s): DDIMER in the last 72 hours. Hemoglobin A1C: No results for input(s): HGBA1C in the last 72 hours. Fasting Lipid Panel:  Recent Labs  07/05/16 0313  CHOL 166  HDL 54  LDLCALC 94  TRIG 88  CHOLHDL 3.1   RADIOLOGY: Dg Chest 2 View  Result Date: 07/06/2016 CLINICAL DATA:  Status post pacemaker placement. EXAM: CHEST  2 VIEW  COMPARISON:  07/04/2016 FINDINGS: New left anterior chest wall biventricular cardioverter-defibrillator has its leads well-positioned. There is no pneumothorax. No evidence of pneumonia or pulmonary edema. Minimal pleural effusions evident on the lateral view. No pneumothorax. IMPRESSION: 1. Well-positioned left anterior chest wall biventricular cardioverter-defibrillator. 2. No pneumothorax.  No acute cardiopulmonary disease. Electronically Signed   By: Lajean Manes M.D.   On: 07/06/2016 07:18   Dg Chest Port 1 View  Result Date: 07/04/2016 CLINICAL DATA:  Syncope.  History of breast carcinoma EXAM: PORTABLE CHEST 1 VIEW COMPARISON:  June 13, 2015 FINDINGS: The interstitium is mildly prominent in a generalized manner, likely due to chronic inflammatory type change. There is no frank edema or consolidation. Heart size and pulmonary vascularity are normal. No adenopathy. There are surgical clips in left axillary region. No bone lesions are evident. IMPRESSION: Surgical clips in left axilla.  Interstitial thickening diffusely without edema or consolidation. Interstitial markings are marginally more prominent on the left than on the right, a finding that may be due to residua from previous radiation therapy for breast carcinoma on the left. No adenopathy. Electronically Signed   By: Lowella Grip III M.D.   On: 07/04/2016 16:30    ASSESSMENT AND PLAN:  Principal Problem:   Complete heart block (HCC) Active Problems:   Essential hypertension   LBBB (left bundle branch block)   Ventricular tachycardia (HCC)   Faintness   Elevated troponin  1. Complete heart block Doing well s/p BiV pacemaker CXR reveals stable leads Device interrogation is reviewed and normal  2. Acute MI Doing well Resume ticagrelor Cardiac rehab  3. HTN BP is low  Transfer to telemetry Anticipate discharge to home tomorrow  Thompson Grayer MD, [facility] 07/06/2016 2:07 PM

## 2016-07-07 DIAGNOSIS — Z9861 Coronary angioplasty status: Secondary | ICD-10-CM

## 2016-07-07 DIAGNOSIS — I255 Ischemic cardiomyopathy: Secondary | ICD-10-CM | POA: Diagnosis present

## 2016-07-07 DIAGNOSIS — I2510 Atherosclerotic heart disease of native coronary artery without angina pectoris: Secondary | ICD-10-CM

## 2016-07-07 DIAGNOSIS — R55 Syncope and collapse: Secondary | ICD-10-CM | POA: Diagnosis present

## 2016-07-07 DIAGNOSIS — Z8739 Personal history of other diseases of the musculoskeletal system and connective tissue: Secondary | ICD-10-CM

## 2016-07-07 DIAGNOSIS — Z950 Presence of cardiac pacemaker: Secondary | ICD-10-CM | POA: Diagnosis not present

## 2016-07-07 MED ORDER — NITROGLYCERIN 0.4 MG SL SUBL: 0.4000 mg | SUBLINGUAL_TABLET | SUBLINGUAL | 2 refills | Status: DC | PRN

## 2016-07-07 MED ORDER — TICAGRELOR 90 MG PO TABS: 90.0000 mg | ORAL_TABLET | Freq: Two times a day (BID) | ORAL | 3 refills | Status: DC

## 2016-07-07 MED ORDER — PANTOPRAZOLE SODIUM 40 MG PO TBEC: 40.0000 mg | DELAYED_RELEASE_TABLET | Freq: Every day | ORAL | 5 refills | Status: DC

## 2016-07-07 MED ORDER — NITROGLYCERIN 0.4 MG SL SUBL: 0.4000 mg | SUBLINGUAL_TABLET | SUBLINGUAL | Status: DC | PRN

## 2016-07-07 MED ORDER — ATORVASTATIN CALCIUM 40 MG PO TABS: 40.0000 mg | ORAL_TABLET | Freq: Every day | ORAL | 3 refills | Status: DC

## 2016-07-07 MED ORDER — ATORVASTATIN CALCIUM 40 MG PO TABS
40.0000 mg | ORAL_TABLET | Freq: Every day | ORAL | Status: DC
  Filled 2016-07-07: qty 1

## 2016-07-07 MED ORDER — PANTOPRAZOLE SODIUM 40 MG PO TBEC
40.0000 mg | DELAYED_RELEASE_TABLET | Freq: Every day | ORAL | 5 refills | Status: DC
Start: 2016-07-07 — End: 2016-07-07

## 2016-07-07 MED ORDER — ACETAMINOPHEN 325 MG PO TABS: 650.0000 mg | ORAL_TABLET | ORAL | Status: DC | PRN

## 2016-07-07 NOTE — Discharge Instructions (Signed)
Heart Attack A heart attack (myocardial infarction, MI) causes damage to the heart that cannot be fixed. A heart attack often happens when a blood clot or other blockage cuts blood flow to the heart. When this happens, certain areas of the heart begin to die. This causes the pain you feel during a heart attack. HOME CARE  Take medicine as told by your doctor. You may need medicine to:  Keep your blood from clotting too easily.  Control your blood pressure.  Lower your cholesterol.  Control abnormal heart rhythms.  Change certain behaviors as told by your doctor. This may include:  Quitting smoking.  Being active.  Eating a heart-healthy diet. Ask your doctor for help with this diet.  Keeping a healthy weight.  Keeping your diabetes under control.  Lessening stress.  Limiting how much alcohol you drink. Do not take these medicines unless your doctor says that you can:  Nonsteroidal anti-inflammatory drugs (NSAIDs). These include:  Ibuprofen.  Naproxen.  Celecoxib.  Vitamin supplements that have vitamin A, vitamin E, or both.  Hormone therapy that contains estrogen with or without progestin. GET HELP RIGHT AWAY IF:  You have sudden chest discomfort.  You have sudden discomfort in your:  Arms.  Back.  Neck.  Jaw.  You have shortness of breath at any time.  You have sudden sweating or clammy skin.  You feel sick to your stomach (nauseous) or throw up (vomit).  You suddenly get light-headed or dizzy.  You feel your heart beating fast or skipping beats. These symptoms may be an emergency. Do not wait to see if the symptoms will go away. Get medical help right away. Call your local emergency services (911 in the [place] Do not drive yourself to the hospital.   This information is not intended to replace advice given to you by your health care provider. Make sure you discuss any questions you have with your health care provider.   Document Released:  05/05/2012 Document Revised: 03/21/2015 Document Reviewed: 01/07/2014 Elsevier Interactive Patient Education 2016 [place] Discharge Instructions for  Pacemaker/Defibrillator Patients  Activity No heavy lifting or vigorous activity with your left/right arm for 6 to 8 weeks.  Do not raise your left/right arm above your head for one week.  Gradually raise your affected arm as drawn below.             07/09/16                     07/10/16                    07/11/16                  07/12/16 __  NO DRIVING until cleared at your follow up visit  [place] the wound area clean and dry.  Do not get this area wet for one week. No showers for one week; you may shower on  07/12/16   . - The tape/steri-strips on your wound will fall off; do not pull them off.  No bandage is needed on the site.  DO  NOT apply any creams, oils, or ointments to the wound area. - If you notice any drainage or discharge from the wound, any swelling or bruising at the site, or you develop a fever > 101? F after you are discharged home, call the office at once.  Special Instructions - You are still able to use  cellular telephones; use the ear opposite the side where you have your pacemaker/defibrillator.  Avoid carrying your cellular phone near your device. - When traveling through airports, show security personnel your identification card to avoid being screened in the metal detectors.  Ask the security personnel to use the hand wand. - Avoid arc welding equipment, MRI testing (magnetic resonance imaging), TENS units (transcutaneous nerve stimulators).  Call the office for questions about other devices. - Avoid electrical appliances that are in poor condition or are not properly grounded. - Microwave ovens are safe to be near or to operate.  Additional information for defibrillator patients should your device go off: - If your device goes off ONCE and you feel fine afterward, notify the device  clinic nurses. - If your device goes off ONCE and you do not feel well afterward, call 911. - If your device goes off TWICE, call 911. - If your device goes off THREE times in one day, call 911.  DO NOT DRIVE YOURSELF OR A FAMILY MEMBER WITH A DEFIBRILLATOR TO THE HOSPITAL--CALL 911.

## 2016-07-07 NOTE — Care Management Note (Signed)
Case Management Note  Patient Details  Name: [name] MRN: [id number] Date of Birth: 09-27-1946  Subjective/Objective:                  Syncope and collapse Action/Plan: Discharge planning Expected Discharge Date:  07/07/16               Expected Discharge Plan:  Home/Self Care  In-House Referral:     Discharge planning Services  CM Consult, Medication Assistance  Post Acute Care Choice:  NA Choice offered to:     DME Arranged:  N/A DME Agency:  NA  HH Arranged:  NA HH Agency:  NA  Status of Service:  Completed, signed off  If discussed at [place] of Stay Meetings, dates discussed:    Additional Comments: CM met with pt in room and gave pt Brilinta free 30 day trial card.  Pt verbalized understanding the card will cover today's discharge prescription and give insurance enough time to authorize for refills.  NO other CM needs were communicated. Dellie Catholic, RN 07/07/2016, 3:23 PM

## 2016-07-07 NOTE — Discharge Summary (Signed)
Patient ID: [name],  MRN: [id number], DOB/AGE: [birth year] 70 y.o.  Admit date: 07/04/2016 Discharge date: 07/07/2016  Primary Care Provider: Marjorie Smolder, MD Primary Cardiologist: Dr Smith/ Dr Lovena Le  Discharge Diagnoses Principal Problem:   Syncope and collapse Active Problems:   Complete heart block [facility])   Ventricular tachycardia (HCC)   NSTEMI (non-ST elevated myocardial infarction) (HCC)   CAD- S/P LAD thrombectomy 07/04/16   LBBB (left bundle branch block)   Cardiomyopathy, ischemic   Pacemaker-MDT BiV placed 07/05/16   History of breast cancer   Asthma   History of rheumatoid arthritis    Procedures: Urgent T-TVDP placement 07/04/16                        Urgent coronary angiogram and LAD PCI 07/04/16                        MDT BiV  pacemaker implant 07/05/16   Hospital Course:  70 y.o. female with PMHx of breast Ca, HTN, RA, depression, was admitted to [facility] 07/04/16 secondary to syncope. The patient was out shopping when she bent to try on some clothes suddenly fainted. She woke quickly and drove herself home. She called her PCP who recommended she go to urgent care. From [facility] she was sent to [place] who discovered her to be in complete heart block with LBBB, HR 30s-40s, also with runs of VT. She received IV amiodarone for frequent runs of VT prior to arrival here. She was transferred to [facility] for further treatment. She was brought emergently to the cath lab 07/04/16 for LHC and temp pacing wire placement. Cath revealed thrombotic occlusion of the LAD, treated with thrombectomy, no stent. Her EF at cath was 45-50%. Her Troponin peaked at 6.37. Echo done 07/05/16 revealed an EF of 30-35%. She went for MDT BiV pacemaker 07/05/16. On 07/06/16 she was transferred to telemetry. Her B/P has been low and Dr Rayann Heman has suggested holding an ACE or beta blocker for now. Satin added at discharge (Lipitor at 40 mg secondary to history of RA). She'll be set up for  an OP TOC OV and wound check (same day) in 7-10 days. She'll need to see Dr Lovena Le in 3 months. for a pacemaker check.     Discharge Vitals:  Blood pressure (!) 127/55, pulse 79, temperature 98 F (36.7 C), temperature source Oral, resp. rate 18, height 5\' 2"  (1.575 m), weight 150 lb 8 oz (68.3 kg), SpO2 96 %.    Labs: Results for orders placed or performed during the hospital encounter of 07/04/16 (from the past 24 hour(s))  Glucose, capillary     Status: Abnormal   Collection Time: 07/06/16 11:22 PM  Result Value Ref Range   Glucose-Capillary 118 (H) 65 - 99 mg/dL    Disposition:  Follow-up Information    [facility] Follow up on 07/18/2016.   Specialty:  Cardiology Why:  10:00AM, wound check Contact information: [address], [place] [phone]          Discharge Medications:    Medication List    STOP taking these medications   losartan-hydrochlorothiazide 100-12.5 MG tablet Commonly known as:  HYZAAR   omeprazole 20 MG capsule Commonly known as:  PRILOSEC Replaced by:  pantoprazole 40 MG tablet     TAKE these medications   acetaminophen 325 MG tablet Commonly known as:  TYLENOL Take 2 tablets (650 mg total) by mouth every 4 (four) hours as needed for headache or mild pain.   albuterol 108 (90 Base) MCG/ACT inhaler Commonly known as:  PROVENTIL HFA;VENTOLIN HFA Inhale into the lungs every 6 (six) hours as needed for wheezing or shortness of breath.   ALPRAZolam 0.25 MG dissolvable tablet Commonly known as:  NIRAVAM Take 0.25 mg by mouth at bedtime as needed for anxiety.   aspirin 81 MG tablet Take 81 mg by mouth daily.   atorvastatin 40 MG tablet Commonly known as:  LIPITOR Take 1 tablet (40 mg total) by mouth daily at 6 PM.   calcium carbonate 600 MG Tabs tablet Commonly known as:  OS-CAL Take 600 mg by mouth 2 (two) times daily with a meal.   cholecalciferol 1000 units  tablet Commonly known as:  VITAMIN D Take 1,000 Units by mouth daily.   citalopram 40 MG tablet Commonly known as:  CELEXA Take 40 mg by mouth daily.   clonazePAM 0.5 MG tablet Commonly known as:  KLONOPIN Take 0.5 mg by mouth every 12 (twelve) hours as needed for anxiety.   hydroxychloroquine 200 MG tablet Commonly known as:  PLAQUENIL Take 200 mg by mouth See admin instructions. 200mg  twice daily Monday through Friday   methotrexate 2.5 MG tablet Commonly known as:  RHEUMATREX Take 15 mg by mouth once a week. On Saturday  Caution:Chemotherapy. Protect from light.   nitroGLYCERIN 0.4 MG SL tablet Commonly known as:  NITROSTAT Place 1 tablet (0.4 mg total) under the tongue every 5 (five) minutes as needed for chest pain.   pantoprazole 40 MG tablet Commonly known as:  PROTONIX Take 1 tablet (40 mg total) by mouth daily. Replaces:  omeprazole 20 MG capsule   ticagrelor 90 MG Tabs tablet Commonly known as:  BRILINTA Take 1 tablet (90 mg total) by mouth 2 (two) times daily.        Duration of Discharge Encounter: Greater than 30 minutes including physician time.  Angelena Form PA-C 07/07/2016 10:23 AM    Trude Mcburney

## 2016-07-07 NOTE — Progress Notes (Signed)
SUBJECTIVE: The patient is doing well today.   She has some SOB but denies chest pain.   Marland Kitchen antiseptic oral rinse  7 mL Mouth Rinse BID  . aspirin EC  81 mg Oral Daily  . calcium carbonate  1,250 mg Oral BID WC  . cholecalciferol  1,000 Units Oral Daily  . citalopram  40 mg Oral Daily  . heparin  5,000 Units Subcutaneous Q8H  . hydroxychloroquine  200 mg Oral 2 times per day on Mon Tue Wed Thu Fri  . methotrexate  15 mg Oral Weekly  . mometasone-formoterol  2 puff Inhalation BID  . pantoprazole  40 mg Oral Daily  . sodium chloride flush  3 mL Intravenous Q12H  . ticagrelor  90 mg Oral BID   . nitroGLYCERIN Stopped (07/05/16 0500)    OBJECTIVE: Physical Exam: Vitals:   07/06/16 2108 07/06/16 2324 07/07/16 0607 07/07/16 0825  BP: (!) 119/52 (!) 133/52 (!) 127/55   Pulse: 87 85 79   Resp: 18 18 18    Temp: 97.7 F (36.5 C) 97.5 F (36.4 C) 98 F (36.7 C)   TempSrc: Oral Oral Oral   SpO2: 91% 100% 94% 96%  Weight:   150 lb 8 oz (68.3 kg)   Height:        Intake/Output Summary (Last 24 hours) at 07/07/16 0947 Last data filed at 07/07/16 0913  Gross per 24 hour  Intake              780 ml  Output              901 ml  Net             -121 ml    Telemetry reveals sinus rhythm with Biv Pacing  GEN- The patient is well appearing, alert and oriented x 3 today.   Head- normocephalic, atraumatic Eyes-  Sclera clear, conjunctiva pink Ears- hearing intact Oropharynx- clear Neck- supple, no JVP Lymph- no cervical lymphadenopathy Lungs- Clear to ausculation bilaterally, normal work of breathing Heart- Regular rate and rhythm, no murmurs, rubs or gallops, PMI not laterally displaced GI- soft, NT, ND, + BS Extremities- no clubbing, cyanosis, or edema Skin- device pocket is without hematoma  LABS: Basic Metabolic Panel:  Recent Labs  07/04/16 1600 07/05/16 0312  NA 133* 136  K 4.2 3.4*  CL 101 106  CO2 26 22  GLUCOSE 97 111*  BUN 20 10  CREATININE 0.86 0.60    CALCIUM 8.6* 8.2*   Liver Function Tests:  Recent Labs  07/04/16 1600 07/05/16 0312  AST 27 33  ALT 19 17  ALKPHOS 51 46  BILITOT 0.6 0.7  PROT 6.1* 5.4*  ALBUMIN 3.7 3.0*   No results for input(s): LIPASE, AMYLASE in the last 72 hours. CBC:  Recent Labs  07/04/16 1600 07/05/16 0312  WBC 8.4 6.9  NEUTROABS 6.4  --   HGB 12.2 11.7*  HCT 34.7* 34.1*  MCV 94.6 93.4  PLT 251 232   Cardiac Enzymes:  Recent Labs  07/05/16 0312 07/05/16 1415 07/06/16 0052  TROPONINI 2.01* 4.86* 6.37*   BNP: Invalid input(s): POCBNP D-Dimer: No results for input(s): DDIMER in the last 72 hours. Hemoglobin A1C: No results for input(s): HGBA1C in the last 72 hours. Fasting Lipid Panel:  Recent Labs  07/05/16 0313  CHOL 166  HDL 54  LDLCALC 94  TRIG 88  CHOLHDL 3.1   RADIOLOGY: Dg Chest 2 View  Result Date: 07/06/2016 CLINICAL DATA:  Status  post pacemaker placement. EXAM: CHEST  2 VIEW COMPARISON:  07/04/2016 FINDINGS: New left anterior chest wall biventricular cardioverter-defibrillator has its leads well-positioned. There is no pneumothorax. No evidence of pneumonia or pulmonary edema. Minimal pleural effusions evident on the lateral view. No pneumothorax. IMPRESSION: 1. Well-positioned left anterior chest wall biventricular cardioverter-defibrillator. 2. No pneumothorax.  No acute cardiopulmonary disease. Electronically Signed   By: Lajean Manes M.D.   On: 07/06/2016 07:18   Dg Chest Port 1 View  Result Date: 07/04/2016 CLINICAL DATA:  Syncope.  History of breast carcinoma EXAM: PORTABLE CHEST 1 VIEW COMPARISON:  June 13, 2015 FINDINGS: The interstitium is mildly prominent in a generalized manner, likely due to chronic inflammatory type change. There is no frank edema or consolidation. Heart size and pulmonary vascularity are normal. No adenopathy. There are surgical clips in left axillary region. No bone lesions are evident. IMPRESSION: Surgical clips in left axilla.  Interstitial thickening diffusely without edema or consolidation. Interstitial markings are marginally more prominent on the left than on the right, a finding that may be due to residua from previous radiation therapy for breast carcinoma on the left. No adenopathy. Electronically Signed   By: Lowella Grip III M.D.   On: 07/04/2016 16:30    ASSESSMENT AND PLAN:  Principal Problem:   Complete heart block (HCC) Active Problems:   Essential hypertension   LBBB (left bundle branch block)   Ventricular tachycardia (HCC)   Faintness   Elevated troponin  1. Complete heart block Doing well s/p BiV pacemaker CXR reveals stable leads Device interrogation is reviewed and normal  2. Acute MI Doing well Resume ticagrelor Cardiac rehab Add statin at discharge  3. HTN BP is improving Would not add ace inhibitor or beta blocker now Can be reassessed with outpatient follow-up  DC to home Routine pacemaker wound care and follow-up Will need general cardiology transition of care or 1-2 week follow-up visit.  Due to difficulty with transportation, perhaps this can be done on same day as her wound check. Also needs to follow-up with Dr Tamala Julian for general cardiology and Dr Lovena Le in 3 months for device management.   Thompson Grayer MD, [facility] 07/07/2016 9:47 AM

## 2016-07-07 NOTE — Progress Notes (Signed)
07/07/2016 4:06 PM Discharge AVS meds taken today and those due this evening reviewed.  Follow-up appointments and when to call md reviewed.  D/C IV and TELE.  Questions and concerns addressed.   D/C home per orders. [place]

## 2016-07-08 ENCOUNTER — Encounter (HOSPITAL_COMMUNITY): Payer: Self-pay | Admitting: Internal Medicine

## 2016-07-08 ENCOUNTER — Telehealth: Payer: Self-pay | Admitting: Cardiovascular Disease

## 2016-07-08 LAB — POCT ACTIVATED CLOTTING TIME
ACTIVATED CLOTTING TIME: 301 s
Activated Clotting Time: 224 seconds

## 2016-07-08 NOTE — Telephone Encounter (Signed)
Patient contacted regarding discharge from [facility] on 07/07/16.  Patient understands to follow up with provider Nell Range, PA on 8/30 at 3:00 pm at [facility]. Patient understands discharge instructions? Yes Patient understands medications and regiment? Yes Patient understands to bring all medications to this visit? Yes  Patient asks if she can apply dressing to groin site where temporary pacemaker was placed.  I advised her to try to keep area clean and dry, which she states is difficult due to her anatomy.  I advised her to place clean gauze to the area and that she may apply triple antibiotic ointment if she prefers.  I advised her to observe for s/s of infection and to call back prior to appointment with questions or concerns.  She thanked me for the call.

## 2016-07-08 NOTE — Telephone Encounter (Signed)
LMTCB

## 2016-07-08 NOTE — Telephone Encounter (Signed)
TOC pt 1 to 2 weeks per All[facility]-appt 07-17-16 with Katie at 300p-wound check to follow

## 2016-07-16 NOTE — Progress Notes (Addendum)
Cardiology Office Note    Date:  07/17/2016   ID:  [name], DOB [birth year], MRN [id number]  PCP:  Marjorie Smolder, MD  Cardiologist:  Dr Smith/ Dr Lovena Le  CC: post hospital follow up  History of Present Illness:  [name] is a 70 y.o. female with a history of breast Ca, HTN, RA, depression and recently diagnosed CAD s/p  thrombotic occlusion of the LAD s/p thrombectomy (07/04/16), VT and CHB s/p PPM who presents to clinic for post hospital follow up.  She was recently admitted from 8/17-8/201/17. She was initially admitted to [facility] 07/04/16 secondary to syncope. The patient was out shopping when she bent to try on some clothes suddenly fainted. She woke quickly and drove herself home. She called her PCP who recommended she go to urgent care. From [facility] she was sent to [place] who discovered her to be in complete heart block with LBBB, HR 30s-40s, also with runs of VT. She received IV amiodarone for frequent runs of VT prior to arrival to [facility] where she was transferred for further treatment. She was brought emergently to the cath lab 07/04/16 for LHC and temp pacing wire placement.Cath revealed thrombotic occlusion of the LAD, treated with thrombectomy, no stent. Total occlusion of the mid LAD likely related to a thromboembolic event complicating catheterization. Onset of chest discomfort was temporarily related to the first left coronary angiogram. Her EF at cath was 45-50%. Her Troponin peaked at 6.37. Echo done 07/05/16 revealed an EF of 30-35%. She went for MDT BiV pacemaker 07/05/16. On 07/06/16 she was transferred to telemetry. Her B/P was low and Dr Rayann Heman has suggested holding an ACE or beta blocker for now. Statin added at discharge (Lipitor at 40 mg secondary to history of RA).  Today she presents to clinic for follow up. She has been feeling very weak since going home but feeling stronger every day. Today was the first time she has been out of the house. She  has had some dyspnea on exertion. Overall, she is in shock that this has happened to her. Now in retrospect she had some neck and shoulder pain and wonders if this is related. We discussed how the heart attack occurred as a result of thromboembolic event complicating catheterization since she had chest discomfort was temporarily related to the first left coronary angiogram. She has not had anymore chest pain. No LE edema, orthopnea or PND. No dizziness or syncope. No palpitations. No blood in stool or urine.     Past Medical History:  Diagnosis Date  . Anxiety   . Asthma   . Breast cancer ([place])    left breast  . Depression   . Habitual alcohol use   . Hypertension   . RA (rheumatoid arthritis) ([place])   . Spinal stenosis     Past Surgical History:  Procedure Laterality Date  . BREAST LUMPECTOMY  2005   left  . CARDIAC CATHETERIZATION N/A 07/04/2016   Procedure: Left Heart Cath and Coronary Angiography;  Surgeon: Belva Crome, MD;  Location: [place];  Service: Cardiovascular;  Laterality: N/A;  . CARDIAC CATHETERIZATION N/A 07/04/2016   Procedure: Temporary Pacemaker;  Surgeon: Belva Crome, MD;  Location: [place];  Service: Cardiovascular;  Laterality: N/A;  . EP IMPLANTABLE DEVICE N/A 07/05/2016   Procedure: Pacemaker Implant;  Surgeon: Evans Lance, MD;  Location: [place] [facility];  Service: Cardiovascular;  Laterality: N/A;  . LAMINECTOMY  1966  . LYMPHECTOMY  2005    Current Medications: Outpatient Medications Prior to Visit  Medication Sig Dispense Refill  . acetaminophen (TYLENOL) 325 MG tablet Take 2 tablets (650 mg total) by mouth every 4 (four) hours as needed for headache or mild pain.    Marland Kitchen albuterol (PROVENTIL HFA;VENTOLIN HFA) 108 (90 Base) MCG/ACT inhaler Inhale into the lungs every 6 (six) hours as needed for wheezing or shortness of breath.    . ALPRAZolam (NIRAVAM) 0.25 MG dissolvable tablet Take 0.25 mg by mouth at bedtime as needed for  anxiety.    Marland Kitchen aspirin 81 MG tablet Take 81 mg by mouth daily.      Marland Kitchen atorvastatin (LIPITOR) 40 MG tablet Take 1 tablet (40 mg total) by mouth daily at 6 PM. 90 tablet 3  . calcium carbonate (OS-CAL) 600 MG TABS Take 600 mg by mouth 2 (two) times daily with a meal.      . cholecalciferol (VITAMIN D) 1000 UNITS tablet Take 1,000 Units by mouth daily.      . citalopram (CELEXA) 40 MG tablet Take 40 mg by mouth daily.      . clonazePAM (KLONOPIN) 0.5 MG tablet Take 0.5 mg by mouth every 12 (twelve) hours as needed for anxiety.  0  . hydroxychloroquine (PLAQUENIL) 200 MG tablet Take 200 mg by mouth See admin instructions. 200mg  twice daily Monday through Friday    . methotrexate (RHEUMATREX) 2.5 MG tablet Take 15 mg by mouth once a week. On Saturday  Caution:Chemotherapy. Protect from light.    . nitroGLYCERIN (NITROSTAT) 0.4 MG SL tablet Place 1 tablet (0.4 mg total) under the tongue every 5 (five) minutes as needed for chest pain. 25 tablet 2  . pantoprazole (PROTONIX) 40 MG tablet Take 1 tablet (40 mg total) by mouth daily. 30 tablet 5  . ticagrelor (BRILINTA) 90 MG TABS tablet Take 1 tablet (90 mg total) by mouth 2 (two) times daily. 180 tablet 3   No facility-administered medications prior to visit.      Allergies:   Betadine [povidone iodine] and Hydrocodone   Social History   Social History  . Marital status: Married    Spouse name: N/A  . Number of children: N/A  . Years of education: N/A   Social History Main Topics  . Smoking status: Former Research scientist (life sciences)  . Smokeless tobacco: Never Used     Comment: quit 1995  . Alcohol use 12.6 oz/week    21 Cans of beer per week     Comment: 3 DAILY  . Drug use: No  . Sexual activity: Not Asked   Other Topics Concern  . None   Social History Narrative  . None     Family History:  The patient's family history includes Cancer in her father, paternal aunt, and paternal uncle; Stroke in her mother.     ROS:   Please see the history of  present illness.    ROS All other systems reviewed and are negative.   PHYSICAL EXAM:   VS:  BP (!) 128/58   Pulse 99   Ht 5\' 2"  (1.575 m)   Wt 145 lb 12.8 oz (66.1 kg)   BMI 26.67 kg/m    GEN: Well nourished, well developed, in no acute distress  HEENT: normal  Neck: no JVD, carotid bruits, or masses Cardiac: RRR; no murmurs, rubs, or gallops,no edema  Respiratory:  clear to auscultation bilaterally, normal work of breathing GI: soft, nontender, nondistended, + BS MS: no  deformity or atrophy  Skin: warm and dry, no rash Neuro:  Alert and Oriented x 3, Strength and sensation are intact Psych: euthymic mood, full affect  Wt Readings from Last 3 Encounters:  07/17/16 145 lb 12.8 oz (66.1 kg)  07/07/16 150 lb 8 oz (68.3 kg)  09/09/11 153 lb 6.4 oz (69.6 kg)      Studies/Labs Reviewed:   EKG:  EKG isordered today.  The ekg ordered today demonstrates V paced HR 99  Recent Labs: 07/05/2016: ALT 17; BUN 10; Creatinine, Ser 0.60; Hemoglobin 11.7; Platelets 232; Potassium 3.4; Sodium 136   Lipid Panel    Component Value Date/Time   CHOL 166 07/05/2016 0313   TRIG 88 07/05/2016 0313   HDL 54 07/05/2016 0313   CHOLHDL 3.1 07/05/2016 0313   VLDL 18 07/05/2016 0313   LDLCALC 94 07/05/2016 0313    Additional studies/ records that were reviewed today include:  LHC 07/04/16 Procedures Left Heart Cath and Coronary Angiography  Temporary Pacemaker  Conclusion     Mid Cx to Dist Cx lesion, 60 %stenosed.  Mid LAD 100 % thrombotic occlusion. This was likely the result of a catheter-based thromboembolic event.  Post intervention with catheter-based thrombectomy, there is a 10% residual stenosis.  There is mild left ventricular systolic dysfunction.  LV end diastolic pressure is normal.  The left ventricular ejection fraction is 45-50% by visual estimate.    High-grade AV block with syncope and polymorphic ventricular tachycardia (nonsustained - possibly bradycardia  dependent). Heavy mitral annular calcification suggests that AV block is chronic and degenerative in nature. The patient will likely require a permanent pacemaker.  Temporary pacemaker implantation via right femoral approach.  Total occlusion of the mid LAD likely related to a thromboembolic event complicating catheterization. Onset of chest discomfort was temporarily related to the first left coronary angiogram.  Successful catheter-based thrombectomy of the mid LAD reducing 100% stenosis to less than 10% with TIMI grade 3 flow. Stenting was not required.  Otherwise widely patent/normal coronary arteries.  Mid anterior wall hypokinesis with EF 40-50%. (Interestingly, the ventriculogram was performed prior to left coronary angiography).  RECOMMENDATIONS:   Aggrastat 18 hours.  Aspirin and Brilinta were loaded in the cath lab.  Consider permanent pacemaker tomorrow afternoon.  Temporary transvenous pacemaker was secured in place and hopefully will be removed within the next 24-48 hours.  Trend cardiac markers.   2D ECHO: 07/05/2016 LV EF: 30% -   35% Study Conclusions - Left ventricle: The cavity size was normal. Wall thickness was   increased in a pattern of mild LVH. Systolic function was   moderately to severely reduced. The estimated ejection fraction   was in the range of 30% to 35%. Dyskinesis of the   mid-apicalanteroseptal, anterior, and apical myocardium. Doppler   parameters are consistent with restrictive physiology, indicative   of decreased left ventricular diastolic compliance and/or   increased left atrial pressure. Acoustic contrast opacification   revealed no evidence ofthrombus. - Mitral valve: Calcified annulus. There was mild regurgitation. - Left atrium: The atrium was mildly dilated. - Pulmonary arteries: PA peak pressure: 31 mm Hg (S).   ASSESSMENT & PLAN:   Complete heart block: s/p BiV pacemaker. Wound check today. She presented with syncope. She  wants to know if she can drive. I will defer to Dr. Lovena Le.  CAD: recently acute MI s/p thrombectomy to LAD. Continue ASA/Brilinta, statin. No BB was added due to hypotension during admission. Will add today. She would like to  participate in cardiac rehab. I will fill out paperwork for referral.   HTN: BP now 128/58. Will add Coreg 3.125mg  BID.   Ischemic CM: BP was too low to add BB or ACEi in hospital. Will add Coreg 3.125mg  BID today and plan to see her back in a few weeks to add ACEi. Appears euvolemic off diuretics. Will need to repeat echo in ~40 days to see if LV function as improved.   VT: treated with IV amio while admitted. Per cath note: polymorphic ventricular tachycardia (nonsustained - possibly bradycardia dependent).   Medication Adjustments/Labs and Tests Ordered: Current medicines are reviewed at length with the patient today.  Concerns regarding medicines are outlined above.  Medication changes, Labs and Tests ordered today are listed in the Patient Instructions below. Patient Instructions  Medication Instructions:  Your physician has recommended you make the following change in your medication:  1.  START Coreg 3.125 mg taking 1 tablet twice a day   Labwork: None ordered  Testing/Procedures: None ordereD  Follow-Up: Your physician recommends that you schedule a follow-up appointment in: 08/06/16 ARRIVE AT 3:15 TO SEE KATIE Hildy Nicholl, PA-C  KEEP YOUR APPOINTMENTS WITH DR. Tamala Julian AND DR. Lovena Le AS PLANNED   Any Other Special Instructions Will Be Listed Below (If Applicable). You have been referred to [facility].  THEY WILL CONTACT YOU TO SCHEDULE AN APPOINTMENT.    If you need a refill on your cardiac medications before your next appointment, please call your pharmacy.      Signed, Angelena Form, PA-C  07/17/2016 3:42 PM    [facility] [place], [place], [place]  [address] Phone: [phone]; Fax: [phone]

## 2016-07-17 ENCOUNTER — Encounter: Payer: Self-pay | Admitting: Physician Assistant

## 2016-07-17 ENCOUNTER — Encounter: Payer: Self-pay | Admitting: Internal Medicine

## 2016-07-17 ENCOUNTER — Ambulatory Visit (INDEPENDENT_AMBULATORY_CARE_PROVIDER_SITE_OTHER): Payer: Medicare Other | Admitting: *Deleted

## 2016-07-17 ENCOUNTER — Ambulatory Visit (INDEPENDENT_AMBULATORY_CARE_PROVIDER_SITE_OTHER): Payer: Medicare Other | Admitting: Physician Assistant

## 2016-07-17 VITALS — BP 128/58 | HR 99 | Ht 62.0 in | Wt 145.8 lb

## 2016-07-17 DIAGNOSIS — I472 Ventricular tachycardia, unspecified: Secondary | ICD-10-CM

## 2016-07-17 DIAGNOSIS — I10 Essential (primary) hypertension: Secondary | ICD-10-CM

## 2016-07-17 DIAGNOSIS — Z950 Presence of cardiac pacemaker: Secondary | ICD-10-CM

## 2016-07-17 DIAGNOSIS — R55 Syncope and collapse: Secondary | ICD-10-CM

## 2016-07-17 DIAGNOSIS — I442 Atrioventricular block, complete: Secondary | ICD-10-CM

## 2016-07-17 DIAGNOSIS — I255 Ischemic cardiomyopathy: Secondary | ICD-10-CM

## 2016-07-17 LAB — CUP PACEART INCLINIC DEVICE CHECK
Battery Voltage: 3.05 V
Brady Statistic AS VS Percent: 0.05 %
Date Time Interrogation Session: 20170830175716
Implantable Lead Implant Date: 20170818
Implantable Lead Location: 753859
Implantable Lead Model: 4396
Lead Channel Impedance Value: 342 Ohm
Lead Channel Impedance Value: 361 Ohm
Lead Channel Impedance Value: 456 Ohm
Lead Channel Pacing Threshold Amplitude: 0.5 V
Lead Channel Pacing Threshold Amplitude: 1 V
Lead Channel Pacing Threshold Amplitude: 1 V
Lead Channel Pacing Threshold Pulse Width: 0.4 ms
Lead Channel Pacing Threshold Pulse Width: 0.4 ms
Lead Channel Setting Pacing Amplitude: 2.75 V
Lead Channel Setting Pacing Amplitude: 3.5 V
Lead Channel Setting Pacing Amplitude: 3.5 V
Lead Channel Setting Pacing Pulse Width: 0.4 ms
Lead Channel Setting Sensing Sensitivity: 0.9 mV
MDC IDC LEAD IMPLANT DT: 20170818
MDC IDC LEAD IMPLANT DT: 20170818
MDC IDC LEAD LOCATION: 753858
MDC IDC LEAD LOCATION: 753860
MDC IDC MSMT LEADCHNL LV IMPEDANCE VALUE: 380 Ohm
MDC IDC MSMT LEADCHNL LV IMPEDANCE VALUE: 494 Ohm
MDC IDC MSMT LEADCHNL LV IMPEDANCE VALUE: 513 Ohm
MDC IDC MSMT LEADCHNL LV IMPEDANCE VALUE: 627 Ohm
MDC IDC MSMT LEADCHNL LV PACING THRESHOLD PULSEWIDTH: 0.4 ms
MDC IDC MSMT LEADCHNL RA IMPEDANCE VALUE: 361 Ohm
MDC IDC MSMT LEADCHNL RA SENSING INTR AMPL: 2.5 mV
MDC IDC MSMT LEADCHNL RV IMPEDANCE VALUE: 475 Ohm
MDC IDC SET LEADCHNL RV PACING PULSEWIDTH: 0.4 ms
MDC IDC STAT BRADY AP VP PERCENT: 0.36 %
MDC IDC STAT BRADY AP VS PERCENT: 0.01 %
MDC IDC STAT BRADY AS VP PERCENT: 99.58 %
MDC IDC STAT BRADY RA PERCENT PACED: 0.37 %
MDC IDC STAT BRADY RV PERCENT PACED: 99.94 %

## 2016-07-17 MED ORDER — CARVEDILOL 3.125 MG PO TABS: 3.1250 mg | ORAL_TABLET | Freq: Two times a day (BID) | ORAL | 3 refills | Status: DC

## 2016-07-17 NOTE — Progress Notes (Signed)
Wound check appointment. Steri-strips removed. Wound without redness or edema. Incision edges approximated, wound well healed. Normal device function. Thresholds, sensing, and impedances consistent with implant measurements. Device programmed at 3.5V with auto capture programmed on for extra safety margin until 3 month visit. Histogram distribution appropriate for patient and level of activity. No mode switches or high ventricular rates noted. 4 Vs episodes--VSRP, longest 15sec. BiV pacing 99.7%. Patient educated about wound care, arm mobility, lifting restrictions. ROV with GT on 10/16/16. Will clarify driving restrictions with GT.

## 2016-07-17 NOTE — Patient Instructions (Addendum)
Medication Instructions:  Your physician has recommended you make the following change in your medication:  1.  START Coreg 3.125 mg taking 1 tablet twice a day   Labwork: None ordered  Testing/Procedures: None ordereD  Follow-Up: Your physician recommends that you schedule a follow-up appointment in: 08/06/16 ARRIVE AT 3:15 TO SEE KATIE THOMPSON, PA-C  KEEP YOUR APPOINTMENTS WITH DR. Tamala Julian AND DR. Lovena Le AS PLANNED   Any Other Special Instructions Will Be Listed Below (If Applicable). You have been referred to [place].  THEY WILL CONTACT YOU TO SCHEDULE AN APPOINTMENT.    If you need a refill on your cardiac medications before your next appointment, please call your pharmacy.

## 2016-07-18 ENCOUNTER — Ambulatory Visit: Payer: Medicare Other

## 2016-07-18 ENCOUNTER — Telehealth: Payer: Self-pay | Admitting: Interventional Cardiology

## 2016-07-18 NOTE — Telephone Encounter (Signed)
Discussed with Dr Lovena Le and she may start driving tomorrow.  I have left a message for her with these recommendations.  Asked her to call back with any questions

## 2016-07-18 NOTE — Telephone Encounter (Signed)
Per Nell Range, PA-C the decision to allow the patient to drive will be deferred to Dr.Taylor. Will fwd message to him and his nurse Leonia Reader

## 2016-07-18 NOTE — Telephone Encounter (Signed)
New message      Pt called stating that she saw Grandville Silos yesterday and she was suppose to call her back and let her know when she could start to drive again. She states that she has yet to here from her. Please call.

## 2016-07-19 ENCOUNTER — Encounter: Payer: Self-pay | Admitting: Physician Assistant

## 2016-07-25 ENCOUNTER — Telehealth (HOSPITAL_COMMUNITY): Payer: Self-pay | Admitting: *Deleted

## 2016-07-25 NOTE — Telephone Encounter (Signed)
Received office referral for pt to attend cardiac rehab.  Called and left message for pt to return call for sign up.  Contact information provided. Cherre Huger, BSN

## 2016-08-01 ENCOUNTER — Telehealth: Payer: Self-pay | Admitting: Internal Medicine

## 2016-08-01 NOTE — Telephone Encounter (Signed)
Returned call to patient and let her know she should call her PCP for her throat and head cold.  She says the back of her throat is on fire. She has tried gargling with warm salt water and this has not helped  She may try some chloraseptic spray to get her through the night and go to her PCP tomorrow but she will call them today

## 2016-08-01 NOTE — Telephone Encounter (Signed)
Follow Up:    Pt calling again,says she can not swallow anything but water.Please call asap.

## 2016-08-01 NOTE — Telephone Encounter (Signed)
New message      Pt has a sore throat, runny nose, achy, coughing.  What can she take over the counter?  Pt had a heart attack 06-25-16 and then got a pacemaker.

## 2016-08-05 NOTE — Progress Notes (Addendum)
Cardiology Office Note    Date:  08/06/2016   ID:  JERRI DEVILBISS, DOB 1946/05/11, MRN [id number]  PCP:  Marjorie Smolder, MD  Cardiologist:  Dr Smith/ Dr Lovena Le  CC: follow up  History of Present Illness:  Sabreen Riedesel Sanden is a 70 y.o. female with a history of breast Ca, HTN, RA, depression and recently diagnosed CAD s/p  thrombotic occlusion of the LAD s/p thrombectomy (07/04/16), VT and CHB s/p PPM who presents to clinic for post hospital follow up.  She was recently admitted from 8/17-8/201/17. She was initially admitted to [facility] 07/04/16 secondary to syncope. The patient was out shopping when she bent to try on some clothes suddenly fainted. She woke quickly and drove herself home. She called her PCP who recommended she go to urgent care. From [facility] she was sent to [place] who discovered her to be in complete heart block with LBBB, HR 30s-40s, also with runs of VT. She received IV amiodarone for frequent runs of VT prior to arrival to [facility] where she was transferred for further treatment. She was brought emergently to the cath lab 07/04/16 for LHC and temp pacing wire placement.Cath revealed thrombotic occlusion of the LAD, treated with thrombectomy, no stent. Total occlusion of the mid LAD likely related to a thromboembolic event complicating catheterization. Onset of chest discomfort was temporarily related to the first left coronary angiogram. Her EF at cath was 45-50%. Her Troponin peaked at 6.37. Echo done 07/05/16 revealed an EF of 30-35%. She went for MDT BiV pacemaker 07/05/16. On 07/06/16 she was transferred to telemetry. Her B/P was low and Dr Rayann Heman has suggested holding an ACE or beta blocker for now. Statin added at discharge (Lipitor at 40 mg secondary to history of RA).  I saw her in clinic on 07/17/16 for [post hospital follow up. She was feeling very weak since going home but getting stronger every day. Overall, she was in shock that this has happened to her. In  retrospect she had some neck and shoulder pain and wondered if this was related. We discussed how the heart attack occurred as a result of thromboembolic event complicating catheterization since she had chest discomfort was temporarily related to the first left coronary angiogram. She had not had anymore chest pain. No LE edema, orthopnea or PND. No dizziness or syncope. No palpitations. Her BP was stable so I added Coreg 3.125mg  BID with plans to bring her back to add an ACE/ARB   Today she presents to clinic for follow up. She was doing well until last Friday when she was diagnosed with bronchitis. She was started on Doxycyline. She was febrile but now doing better. She will get a CXR tomorrow. No chest pain. She does have some SOB with the bronchitis. No LE edema, orthopnea or PND. No dizziness or syncope. Sometimes when sits up to fast she gets a little "swimmy headed." She is still just so fatigued.     Past Medical History:  Diagnosis Date  . Anxiety   . Asthma   . Breast cancer ([place])    left breast  . Depression   . Habitual alcohol use   . Hypertension   . RA (rheumatoid arthritis) ([place])   . Spinal stenosis     Past Surgical History:  Procedure Laterality Date  . BREAST LUMPECTOMY  2005   left  . CARDIAC CATHETERIZATION N/A 07/04/2016   Procedure: Left Heart Cath and Coronary Angiography;  Surgeon: Belva Crome, MD;  Location: [place] [facility];  Service: Cardiovascular;  Laterality: N/A;  . CARDIAC CATHETERIZATION N/A 07/04/2016   Procedure: Temporary Pacemaker;  Surgeon: Belva Crome, MD;  Location: [place] [facility];  Service: Cardiovascular;  Laterality: N/A;  . EP IMPLANTABLE DEVICE N/A 07/05/2016   Procedure: Pacemaker Implant;  Surgeon: Evans Lance, MD;  Location: [place] [facility];  Service: Cardiovascular;  Laterality: N/A;  . [place]  . LYMPHECTOMY  2005    Current Medications: Outpatient Medications Prior to Visit  Medication Sig Dispense Refill    . acetaminophen (TYLENOL) 325 MG tablet Take 2 tablets (650 mg total) by mouth every 4 (four) hours as needed for headache or mild pain.    Marland Kitchen albuterol (PROVENTIL HFA;VENTOLIN HFA) 108 (90 Base) MCG/ACT inhaler Inhale into the lungs every 6 (six) hours as needed for wheezing or shortness of breath.    . ALPRAZolam (NIRAVAM) 0.25 MG dissolvable tablet Take 0.25 mg by mouth at bedtime as needed for anxiety.    Marland Kitchen aspirin 81 MG tablet Take 81 mg by mouth daily.      Marland Kitchen atorvastatin (LIPITOR) 40 MG tablet Take 1 tablet (40 mg total) by mouth daily at 6 PM. 90 tablet 3  . budesonide-formoterol (SYMBICORT) 160-4.5 MCG/ACT inhaler Inhale 2 puffs into the lungs 2 (two) times daily.    . calcium carbonate (OS-CAL) 600 MG TABS Take 600 mg by mouth 2 (two) times daily with a meal.      . carvedilol (COREG) 3.125 MG tablet Take 1 tablet (3.125 mg total) by mouth 2 (two) times daily. 180 tablet 3  . cholecalciferol (VITAMIN D) 1000 UNITS tablet Take 1,000 Units by mouth daily.      . citalopram (CELEXA) 40 MG tablet Take 40 mg by mouth daily.      . clonazePAM (KLONOPIN) 0.5 MG tablet Take 0.5 mg by mouth every 12 (twelve) hours as needed for anxiety.  0  . hydroxychloroquine (PLAQUENIL) 200 MG tablet Take 200 mg by mouth See admin instructions. 200mg  twice daily Monday through Friday    . methotrexate (RHEUMATREX) 2.5 MG tablet Take 15 mg by mouth once a week. On Saturday  Caution:Chemotherapy. Protect from light.    . nitroGLYCERIN (NITROSTAT) 0.4 MG SL tablet Place 1 tablet (0.4 mg total) under the tongue every 5 (five) minutes as needed for chest pain. 25 tablet 2  . pantoprazole (PROTONIX) 40 MG tablet Take 1 tablet (40 mg total) by mouth daily. 30 tablet 5  . ticagrelor (BRILINTA) 90 MG TABS tablet Take 1 tablet (90 mg total) by mouth 2 (two) times daily. 180 tablet 3   No facility-administered medications prior to visit.      Allergies:   Betadine [povidone iodine] and Hydrocodone   Social History    Social History  . Marital status: Married    Spouse name: N/A  . Number of children: N/A  . Years of education: N/A   Social History Main Topics  . Smoking status: Former Research scientist (life sciences)  . Smokeless tobacco: Never Used     Comment: quit 1995  . Alcohol use 12.6 oz/week    21 Cans of beer per week     Comment: 3 DAILY  . Drug use: No  . Sexual activity: Not Asked   Other Topics Concern  . None   Social History Narrative  . None     Family History:  The patient's family history includes Cancer in her father, paternal aunt, and paternal  uncle; Stroke in her mother.     ROS:   Please see the history of present illness.    ROS All other systems reviewed and are negative.   PHYSICAL EXAM:   VS:  BP 130/60   Pulse 77   Ht 5\' 2"  (1.575 m)   Wt 143 lb 12.8 oz (65.2 kg)   SpO2 97%   BMI 26.30 kg/m    GEN: Well nourished, well developed, in no acute distress  HEENT: normal  Neck: no JVD, carotid bruits, or masses Cardiac: RRR; no murmurs, rubs, or gallops,no edema  Respiratory:  clear to auscultation bilaterally, normal work of breathing GI: soft, nontender, nondistended, + BS MS: no deformity or atrophy  Skin: warm and dry, no rash Neuro:  Alert and Oriented x 3, Strength and sensation are intact Psych: euthymic mood, full affect  Wt Readings from Last 3 Encounters:  08/06/16 143 lb 12.8 oz (65.2 kg)  07/17/16 145 lb 12.8 oz (66.1 kg)  07/07/16 150 lb 8 oz (68.3 kg)      Studies/Labs Reviewed:   EKG:  EKG isordered today.  The ekg ordered today demonstrates V paced HR 72  Recent Labs: 07/05/2016: ALT 17; BUN 10; Creatinine, Ser 0.60; Hemoglobin 11.7; Platelets 232; Potassium 3.4; Sodium 136   Lipid Panel    Component Value Date/Time   CHOL 166 07/05/2016 0313   TRIG 88 07/05/2016 0313   HDL 54 07/05/2016 0313   CHOLHDL 3.1 07/05/2016 0313   VLDL 18 07/05/2016 0313   LDLCALC 94 07/05/2016 0313    Additional studies/ records that were reviewed today include:   LHC 07/04/16 Procedures Left Heart Cath and Coronary Angiography  Temporary Pacemaker  Conclusion     Mid Cx to Dist Cx lesion, 60 %stenosed.  Mid LAD 100 % thrombotic occlusion. This was likely the result of a catheter-based thromboembolic event.  Post intervention with catheter-based thrombectomy, there is a 10% residual stenosis.  There is mild left ventricular systolic dysfunction.  LV end diastolic pressure is normal.  The left ventricular ejection fraction is 45-50% by visual estimate.    High-grade AV block with syncope and polymorphic ventricular tachycardia (nonsustained - possibly bradycardia dependent). Heavy mitral annular calcification suggests that AV block is chronic and degenerative in nature. The patient will likely require a permanent pacemaker.  Temporary pacemaker implantation via right femoral approach.  Total occlusion of the mid LAD likely related to a thromboembolic event complicating catheterization. Onset of chest discomfort was temporarily related to the first left coronary angiogram.  Successful catheter-based thrombectomy of the mid LAD reducing 100% stenosis to less than 10% with TIMI grade 3 flow. Stenting was not required.  Otherwise widely patent/normal coronary arteries.  Mid anterior wall hypokinesis with EF 40-50%. (Interestingly, the ventriculogram was performed prior to left coronary angiography).  RECOMMENDATIONS:   Aggrastat 18 hours.  Aspirin and Brilinta were loaded in the cath lab.  Consider permanent pacemaker tomorrow afternoon.  Temporary transvenous pacemaker was secured in place and hopefully will be removed within the next 24-48 hours.  Trend cardiac markers.   2D ECHO: 07/05/2016 LV EF: 30% -   35% Study Conclusions - Left ventricle: The cavity size was normal. Wall thickness was   increased in a pattern of mild LVH. Systolic function was   moderately to severely reduced. The estimated ejection fraction   was  in the range of 30% to 35%. Dyskinesis of the   mid-apicalanteroseptal, anterior, and apical myocardium. Doppler   parameters  are consistent with restrictive physiology, indicative   of decreased left ventricular diastolic compliance and/or   increased left atrial pressure. Acoustic contrast opacification   revealed no evidence ofthrombus. - Mitral valve: Calcified annulus. There was mild regurgitation. - Left atrium: The atrium was mildly dilated. - Pulmonary arteries: PA peak pressure: 31 mm Hg (S).   ASSESSMENT & PLAN:   Complete heart block: s/p BiV pacemaker. She has been cleared to drive   CAD: recently acute MI s/p thrombectomy to LAD. Total occlusion of the mid LAD likely related to a thromboembolic event complicating catheterization. Continue ASA/Brilinta, statin, BB. The Brilinta is >$400 and she cannot afford this long term. I gave her 2 months worth of samples. We will need to make a decision on whether she will continue on this or not (possibly switch to plavix) or get patient assistance. Will defer to Dr. Tamala Julian at next appointment.   HTN: BP now 130/60. Continue Coreg 3.125mg  BID. Will add Losartan 25mg  daily   Ischemic CM: Continue Coreg 3.125mg  BID and will add Losartan 25mg  daily . Appears euvolemic off diuretics. Will need to repeat echo ~90 days after MI with to see if LV function has improved. This will be ~ 10/04/2016.  VT: treated with IV amio while admitted. Per cath note: polymorphic ventricular tachycardia (nonsustained - possibly bradycardia dependent). Continue BB  Medication Adjustments/Labs and Tests Ordered: Current medicines are reviewed at length with the patient today.  Concerns regarding medicines are outlined above.  Medication changes, Labs and Tests ordered today are listed in the Patient Instructions below. Patient Instructions  Medication Instructions:  Your physician has recommended you make the following change in your medication:  1.  START  Losartan 25 mg taking 1 tablet daily    Labwork: None ordered  Testing/Procedures: Your physician has requested that you have an echocardiogram THE WEEK OF 10/04/16. Echocardiography is a painless test that uses sound waves to create images of your heart. It provides your doctor with information about the size and shape of your heart and how well your heart's chambers and valves are working. This procedure takes approximately one hour. There are no restrictions for this procedure.    Follow-Up: Your physician recommends that you schedule a follow-up appointment in: Keep your follow-up appointments as scheduled   Any Other Special Instructions Will Be Listed Below (If Applicable). Echocardiogram An echocardiogram, or echocardiography, uses sound waves (ultrasound) to produce an image of your heart. The echocardiogram is simple, painless, obtained within a short period of time, and offers valuable information to your health care provider. The images from an echocardiogram can provide information such as:  Evidence of coronary artery disease (CAD).  Heart size.  Heart muscle function.  Heart valve function.  Aneurysm detection.  Evidence of a past heart attack.  Fluid buildup around the heart.  Heart muscle thickening.  Assess heart valve function. LET [facility] CARE PROVIDER KNOW ABOUT:  Any allergies you have.  All medicines you are taking, including vitamins, herbs, eye drops, creams, and over-the-counter medicines.  Previous problems you or members of your family have had with the use of anesthetics.  Any blood disorders you have.  Previous surgeries you have had.  Medical conditions you have.  Possibility of pregnancy, if this applies. BEFORE THE PROCEDURE  No special preparation is needed. Eat and drink normally.  PROCEDURE   In order to produce an image of your heart, gel will be applied to your chest and a wand-like tool (transducer)  will be moved over  your chest. The gel will help transmit the sound waves from the transducer. The sound waves will harmlessly bounce off your heart to allow the heart images to be captured in real-time motion. These images will then be recorded.  You may need an IV to receive a medicine that improves the quality of the pictures. AFTER THE PROCEDURE You may return to your normal schedule including diet, activities, and medicines, unless your health care provider tells you otherwise.   This information is not intended to replace advice given to you by your health care provider. Make sure you discuss any questions you have with your health care provider.   Document Released: 11/01/2000 Document Revised: 11/25/2014 Document Reviewed: 07/12/2013 Elsevier Interactive Patient Education [employer].     If you need a refill on your cardiac medications before your next appointment, please call your pharmacy.      Signed, Angelena Form, PA-C  08/06/2016 4:08 PM    [facility] [place], [place], [place]  [address] Phone: [phone]; Fax: [phone]

## 2016-08-06 ENCOUNTER — Encounter: Payer: Self-pay | Admitting: Physician Assistant

## 2016-08-06 ENCOUNTER — Ambulatory Visit (INDEPENDENT_AMBULATORY_CARE_PROVIDER_SITE_OTHER): Payer: Medicare Other | Admitting: Physician Assistant

## 2016-08-06 VITALS — BP 130/60 | HR 77 | Ht 62.0 in | Wt 143.8 lb

## 2016-08-06 DIAGNOSIS — Z950 Presence of cardiac pacemaker: Secondary | ICD-10-CM | POA: Diagnosis not present

## 2016-08-06 DIAGNOSIS — R55 Syncope and collapse: Secondary | ICD-10-CM

## 2016-08-06 DIAGNOSIS — I442 Atrioventricular block, complete: Secondary | ICD-10-CM | POA: Diagnosis not present

## 2016-08-06 DIAGNOSIS — I472 Ventricular tachycardia, unspecified: Secondary | ICD-10-CM

## 2016-08-06 DIAGNOSIS — I10 Essential (primary) hypertension: Secondary | ICD-10-CM

## 2016-08-06 DIAGNOSIS — I255 Ischemic cardiomyopathy: Secondary | ICD-10-CM | POA: Diagnosis not present

## 2016-08-06 MED ORDER — LOSARTAN POTASSIUM 25 MG PO TABS: 25.0000 mg | ORAL_TABLET | Freq: Every day | ORAL | 3 refills | Status: DC

## 2016-08-06 NOTE — Patient Instructions (Addendum)
Medication Instructions:  Your physician has recommended you make the following change in your medication:  1.  START Losartan 25 mg taking 1 tablet daily    Labwork: None ordered  Testing/Procedures: Your physician has requested that you have an echocardiogram THE WEEK OF 10/04/16. Echocardiography is a painless test that uses sound waves to create images of your heart. It provides your doctor with information about the size and shape of your heart and how well your heart's chambers and valves are working. This procedure takes approximately one hour. There are no restrictions for this procedure.    Follow-Up: Your physician recommends that you schedule a follow-up appointment in: Keep your follow-up appointments as scheduled   Any Other Special Instructions Will Be Listed Below (If Applicable). Echocardiogram An echocardiogram, or echocardiography, uses sound waves (ultrasound) to produce an image of your heart. The echocardiogram is simple, painless, obtained within a short period of time, and offers valuable information to your health care provider. The images from an echocardiogram can provide information such as:  Evidence of coronary artery disease (CAD).  Heart size.  Heart muscle function.  Heart valve function.  Aneurysm detection.  Evidence of a past heart attack.  Fluid buildup around the heart.  Heart muscle thickening.  Assess heart valve function. LET [facility] CARE PROVIDER KNOW ABOUT:  Any allergies you have.  All medicines you are taking, including vitamins, herbs, eye drops, creams, and over-the-counter medicines.  Previous problems you or members of your family have had with the use of anesthetics.  Any blood disorders you have.  Previous surgeries you have had.  Medical conditions you have.  Possibility of pregnancy, if this applies. BEFORE THE PROCEDURE  No special preparation is needed. Eat and drink normally.  PROCEDURE   In order to  produce an image of your heart, gel will be applied to your chest and a wand-like tool (transducer) will be moved over your chest. The gel will help transmit the sound waves from the transducer. The sound waves will harmlessly bounce off your heart to allow the heart images to be captured in real-time motion. These images will then be recorded.  You may need an IV to receive a medicine that improves the quality of the pictures. AFTER THE PROCEDURE You may return to your normal schedule including diet, activities, and medicines, unless your health care provider tells you otherwise.   This information is not intended to replace advice given to you by your health care provider. Make sure you discuss any questions you have with your health care provider.   Document Released: 11/01/2000 Document Revised: 11/25/2014 Document Reviewed: 07/12/2013 Elsevier Interactive Patient Education [employer].     If you need a refill on your cardiac medications before your next appointment, please call your pharmacy.

## 2016-08-07 ENCOUNTER — Ambulatory Visit
Admission: RE | Admit: 2016-08-07 | Discharge: 2016-08-07 | Disposition: A | Discharge status: Home / Self Care | Payer: Medicare Other | Source: Ambulatory Visit | Attending: Family Medicine | Admitting: Family Medicine

## 2016-08-07 ENCOUNTER — Other Ambulatory Visit: Payer: Self-pay | Admitting: Family Medicine

## 2016-08-07 DIAGNOSIS — R059 Cough, unspecified: Secondary | ICD-10-CM

## 2016-08-07 DIAGNOSIS — R05 Cough: Secondary | ICD-10-CM

## 2016-08-07 DIAGNOSIS — R062 Wheezing: Secondary | ICD-10-CM

## 2016-08-20 ENCOUNTER — Encounter (HOSPITAL_COMMUNITY)
Admission: RE | Admit: 2016-08-20 | Discharge: 2016-08-20 | Disposition: A | Discharge status: Home / Self Care | Payer: Medicare Other | Source: Ambulatory Visit | Attending: Interventional Cardiology | Admitting: Interventional Cardiology

## 2016-08-20 DIAGNOSIS — Z9861 Coronary angioplasty status: Secondary | ICD-10-CM | POA: Diagnosis not present

## 2016-08-20 DIAGNOSIS — I252 Old myocardial infarction: Secondary | ICD-10-CM | POA: Diagnosis not present

## 2016-08-20 DIAGNOSIS — Z853 Personal history of malignant neoplasm of breast: Secondary | ICD-10-CM | POA: Insufficient documentation

## 2016-08-20 DIAGNOSIS — M069 Rheumatoid arthritis, unspecified: Secondary | ICD-10-CM | POA: Insufficient documentation

## 2016-08-20 DIAGNOSIS — J45909 Unspecified asthma, uncomplicated: Secondary | ICD-10-CM | POA: Diagnosis not present

## 2016-08-20 DIAGNOSIS — Z87891 Personal history of nicotine dependence: Secondary | ICD-10-CM | POA: Diagnosis not present

## 2016-08-20 DIAGNOSIS — Z7951 Long term (current) use of inhaled steroids: Secondary | ICD-10-CM | POA: Diagnosis not present

## 2016-08-20 DIAGNOSIS — I509 Heart failure, unspecified: Secondary | ICD-10-CM | POA: Diagnosis not present

## 2016-08-20 DIAGNOSIS — Z79899 Other long term (current) drug therapy: Secondary | ICD-10-CM | POA: Diagnosis not present

## 2016-08-20 DIAGNOSIS — Z7902 Long term (current) use of antithrombotics/antiplatelets: Secondary | ICD-10-CM | POA: Insufficient documentation

## 2016-08-20 DIAGNOSIS — F329 Major depressive disorder, single episode, unspecified: Secondary | ICD-10-CM | POA: Insufficient documentation

## 2016-08-20 DIAGNOSIS — I110 Hypertensive heart disease with heart failure: Secondary | ICD-10-CM | POA: Diagnosis not present

## 2016-08-20 NOTE — Progress Notes (Signed)
Cardiac Rehab Medication Review by a Pharmacist  Does the patient  feel that his/her medications are working for him/her?  yes  Has the patient been experiencing any side effects to the medications prescribed?  no  Does the patient measure his/her own blood pressure or blood glucose at home?  no   Does the patient have any problems obtaining medications due to transportation or finances?   yes  Understanding of regimen: excellent Understanding of indications: good Potential of compliance: good    Pharmacist comments: [name] is a 27 [place] who is s/p thrombectomy (07/04/16). Today, she presents in good spirits, but is concerned about her medications d/t cost. The nurse performed the medication reconciliation portion of our meeting. We spent most of the visit talking about prices and how she can address some of those concerns (e.g. Generic options, paying cash, pharmacy price-matching). We also discussed reducing the dose of celexa from 40mg  pending approval from her physician. She is interested in potentially stopping her pantoprazole, for which I informed her that she should speak to her physician and taper off slowly.    Dierdre Harness, [place], PharmD Clinical Pharmacy Resident [phone] (Pager) 08/20/2016 3:35 PM

## 2016-08-21 NOTE — Progress Notes (Signed)
Cardiac Individual Treatment Plan  Patient Details  Name: [name] MRN: [id number] Date of Birth: Jul 02, 1946 Referring Provider:   Flowsheet Row CARDIAC REHAB PHASE II ORIENTATION from 08/20/2016 in [place]  Referring Provider  Daneen Schick MD      Initial Encounter Date:  [place] PHASE II ORIENTATION from 08/20/2016 in [place]  Date  08/20/16  Referring Provider  Daneen Schick MD      Visit Diagnosis: No diagnosis found.  Patient's Home Medications on Admission:  Current Outpatient Prescriptions:  .  acetaminophen (TYLENOL) 325 MG tablet, Take 2 tablets (650 mg total) by mouth every 4 (four) hours as needed for headache or mild pain., Disp: , Rfl:  .  albuterol (PROVENTIL HFA;VENTOLIN HFA) 108 (90 Base) MCG/ACT inhaler, Inhale into the lungs every 6 (six) hours as needed for wheezing or shortness of breath., Disp: , Rfl:  .  ALPRAZolam (NIRAVAM) 0.25 MG dissolvable tablet, Take 0.25 mg by mouth at bedtime as needed for anxiety., Disp: , Rfl:  .  aspirin 81 MG tablet, Take 81 mg by mouth daily.  , Disp: , Rfl:  .  atorvastatin (LIPITOR) 40 MG tablet, Take 1 tablet (40 mg total) by mouth daily at 6 PM., Disp: 90 tablet, Rfl: 3 .  budesonide-formoterol (SYMBICORT) 160-4.5 MCG/ACT inhaler, Inhale 2 puffs into the lungs 2 (two) times daily., Disp: , Rfl:  .  calcium carbonate (OS-CAL) 600 MG TABS, Take 600 mg by mouth 2 (two) times daily with a meal.  , Disp: , Rfl:  .  carvedilol (COREG) 3.125 MG tablet, Take 1 tablet (3.125 mg total) by mouth 2 (two) times daily., Disp: 180 tablet, Rfl: 3 .  cholecalciferol (VITAMIN D) 1000 UNITS tablet, Take 1,000 Units by mouth daily.  , Disp: , Rfl:  .  citalopram (CELEXA) 40 MG tablet, Take 40 mg by mouth daily.  , Disp: , Rfl:  .  clonazePAM (KLONOPIN) 0.5 MG tablet, Take 0.5 mg by mouth every 12 (twelve) hours as needed for anxiety., Disp: , Rfl: 0 .   hydroxychloroquine (PLAQUENIL) 200 MG tablet, Take 200 mg by mouth See admin instructions. 200mg  twice daily Monday through Friday, Disp: , Rfl:  .  losartan (COZAAR) 25 MG tablet, Take 1 tablet (25 mg total) by mouth daily., Disp: 90 tablet, Rfl: 3 .  methotrexate (RHEUMATREX) 2.5 MG tablet, Take 15 mg by mouth once a week. On Saturday  Caution:Chemotherapy. Protect from light., Disp: , Rfl:  .  nitroGLYCERIN (NITROSTAT) 0.4 MG SL tablet, Place 1 tablet (0.4 mg total) under the tongue every 5 (five) minutes as needed for chest pain., Disp: 25 tablet, Rfl: 2 .  pantoprazole (PROTONIX) 40 MG tablet, Take 1 tablet (40 mg total) by mouth daily., Disp: 30 tablet, Rfl: 5 .  ticagrelor (BRILINTA) 90 MG TABS tablet, Take 1 tablet (90 mg total) by mouth 2 (two) times daily., Disp: 180 tablet, Rfl: 3  Past Medical History: Past Medical History:  Diagnosis Date  . Anxiety   . Asthma   . Breast cancer ([place])    left breast  . Depression   . Habitual alcohol use   . Hypertension   . RA (rheumatoid arthritis) ([place])   . Spinal stenosis     Tobacco Use: History  Smoking Status  . Former Smoker  Smokeless Tobacco  . Never Used    Comment: quit 1995    Labs: Recent Review [employer]  Labs for ITP Cardiac and Pulmonary Rehab Latest Ref Rng & Units 07/05/2016   Cholestrol 0 - 200 mg/dL 166   LDLCALC 0 - 99 mg/dL 94   HDL >40 mg/dL 54   Trlycerides <150 mg/dL 88      Capillary Blood Glucose: Lab Results  Component Value Date   GLUCAP 118 (H) 07/06/2016     Exercise Target Goals: Date: 08/20/16  Exercise Program Goal: Individual exercise prescription set with THRR, safety & activity barriers. Participant demonstrates ability to understand and report RPE using BORG scale, to self-measure pulse accurately, and to acknowledge the importance of the exercise prescription.  Exercise Prescription Goal: Starting with aerobic activity 30 plus minutes a day, 3 days per week for initial  exercise prescription. Provide home exercise prescription and guidelines that participant acknowledges understanding prior to discharge.  Activity Barriers & Risk Stratification:     Activity Barriers & Cardiac Risk Stratification - 08/20/16 1454      Activity Barriers & Cardiac Risk Stratification   Activity Barriers Arthritis;Back Problems   Cardiac Risk Stratification High      6 Minute Walk:     6 Minute Walk    Row Name 08/20/16 1657         6 Minute Walk   Phase Initial     Distance 1163 feet     Walk Time 6 minutes     # of Rest Breaks 0     MPH 2.2     METS 2.42     RPE 11     VO2 Peak 8.46     Symptoms Yes (comment)     Comments Participant c/o bilateral thigh soreness during walk.     Resting HR 67 bpm     Resting BP 129/60     Max Ex. HR 89 bpm     Max Ex. BP 122/68     2 Minute Post BP 122/68        Initial Exercise Prescription:     Initial Exercise Prescription - 08/21/16 0800      Date of Initial Exercise RX and Referring Provider   Date 08/20/16   Referring Provider Daneen Schick MD     Bike   Level 0.5   Minutes 10   METs 2.45     NuStep   Level 1   Minutes 10   METs 2.4     Track   Laps 8   Minutes 10   METs 2.39     Prescription Details   Frequency (times per week) 2   Duration Progress to 30 minutes of continuous aerobic without signs/symptoms of physical distress     Intensity   THRR 40-80% of Max Heartrate 59-118   Ratings of Perceived Exertion 11-13   Perceived Dyspnea 0-4     Progression   Progression Continue to progress workloads to maintain intensity without signs/symptoms of physical distress.     Resistance Training   Training Prescription Yes   Weight 1lb   Reps 10-12      Perform Capillary Blood Glucose checks as needed.  Exercise Prescription Changes:   Exercise Comments:   Discharge Exercise Prescription (Final Exercise Prescription Changes):   Nutrition:  Target Goals: Understanding of  nutrition guidelines, daily intake of sodium 1500mg , cholesterol 200mg , calories 30% from fat and 7% or less from saturated fats, daily to have 5 or more servings of fruits and vegetables.  Biometrics:     Pre Biometrics - 08/20/16 1659  Pre Biometrics   Height 5\' 3"  (1.6 m)   Weight 143 lb 8.3 oz (65.1 kg)   Waist Circumference 33.75 inches   Hip Circumference 40.75 inches   Waist to Hip Ratio 0.83 %   BMI (Calculated) 25.5   Triceps Skinfold 18 mm   % Body Fat 35.5 %   Grip Strength 24 kg   Flexibility 11 in   Single Leg Stand 3.93 seconds       Nutrition Therapy Plan and Nutrition Goals:   Nutrition Discharge: Nutrition Scores:   Nutrition Goals Re-Evaluation:   Psychosocial: Target Goals: Acknowledge presence or absence of depression, maximize coping skills, provide positive support system. Participant is able to verbalize types and ability to use techniques and skills needed for reducing stress and depression.  Initial Review & Psychosocial Screening:   Quality of Life Scores:     Quality of Life - 08/20/16 1659      Quality of Life Scores   Health/Function Pre 19.18 %   Socioeconomic Pre 27 %   Psych/Spiritual Pre 21.63 %   Family Pre 21.75 %   GLOBAL Pre 21.65 %      PHQ-9: Recent Review Flowsheet Data    There is no flowsheet data to display.      Psychosocial Evaluation and Intervention:   Psychosocial Re-Evaluation:   Vocational Rehabilitation: Provide vocational rehab assistance to qualifying candidates.   Vocational Rehab Evaluation & Intervention:   Education: Education Goals: Education classes will be provided on a weekly basis, covering required topics. Participant will state understanding/return demonstration of topics presented.  Learning Barriers/Preferences:     Learning Barriers/Preferences - 08/21/16 0809      Learning Barriers/Preferences   Learning Barriers None   Learning Preferences None      Education  Topics: Count Your Pulse:  -Group instruction provided by verbal instruction, demonstration, patient participation and written materials to support subject.  Instructors address importance of being able to find your pulse and how to count your pulse when at home without a heart monitor.  Patients get hands on experience counting their pulse with staff help and individually.   Heart Attack, Angina, and Risk Factor Modification:  -Group instruction provided by verbal instruction, video, and written materials to support subject.  Instructors address signs and symptoms of angina and heart attacks.    Also discuss risk factors for heart disease and how to make changes to improve heart health risk factors.   Functional Fitness:  -Group instruction provided by verbal instruction, demonstration, patient participation, and written materials to support subject.  Instructors address safety measures for doing things around the house.  Discuss how to get up and down off the floor, how to pick things up properly, how to safely get out of a chair without assistance, and balance training.   Meditation and Mindfulness:  -Group instruction provided by verbal instruction, patient participation, and written materials to support subject.  Instructor addresses importance of mindfulness and meditation practice to help reduce stress and improve awareness.  Instructor also leads participants through a meditation exercise.    Stretching for Flexibility and Mobility:  -Group instruction provided by verbal instruction, patient participation, and written materials to support subject.  Instructors lead participants through series of stretches that are designed to increase flexibility thus improving mobility.  These stretches are additional exercise for major muscle groups that are typically performed during regular warm up and cool down.   Hands Only CPR Anytime:  -Group instruction provided by verbal instruction,  video,  patient participation and written materials to support subject.  Instructors co-teach with AHA video for hands only CPR.  Participants get hands on experience with mannequins.   Nutrition I class: Heart Healthy Eating:  -Group instruction provided by PowerPoint slides, verbal discussion, and written materials to support subject matter. The instructor gives an explanation and review of the Therapeutic Lifestyle Changes diet recommendations, which includes a discussion on lipid goals, dietary fat, sodium, fiber, plant stanol/sterol esters, sugar, and the components of a well-balanced, healthy diet.   Nutrition II class: Lifestyle Skills:  -Group instruction provided by PowerPoint slides, verbal discussion, and written materials to support subject matter. The instructor gives an explanation and review of label reading, grocery shopping for heart health, heart healthy recipe modifications, and ways to make healthier choices when eating out.   Diabetes Question & Answer:  -Group instruction provided by PowerPoint slides, verbal discussion, and written materials to support subject matter. The instructor gives an explanation and review of diabetes co-morbidities, pre- and post-prandial blood glucose goals, pre-exercise blood glucose goals, signs, symptoms, and treatment of hypoglycemia and hyperglycemia, and foot care basics.   Diabetes Blitz:  -Group instruction provided by PowerPoint slides, verbal discussion, and written materials to support subject matter. The instructor gives an explanation and review of the physiology behind type 1 and type 2 diabetes, diabetes medications and rational behind using different medications, pre- and post-prandial blood glucose recommendations and Hemoglobin A1c goals, diabetes diet, and exercise including blood glucose guidelines for exercising safely.    Portion Distortion:  -Group instruction provided by PowerPoint slides, verbal discussion, written materials, and  food models to support subject matter. The instructor gives an explanation of serving size versus portion size, changes in portions sizes over the last 20 years, and what consists of a serving from each food group.   Stress Management:  -Group instruction provided by verbal instruction, video, and written materials to support subject matter.  Instructors review role of stress in heart disease and how to cope with stress positively.     Exercising on Your Own:  -Group instruction provided by verbal instruction, power point, and written materials to support subject.  Instructors discuss benefits of exercise, components of exercise, frequency and intensity of exercise, and end points for exercise.  Also discuss use of nitroglycerin and activating EMS.  Review options of places to exercise outside of rehab.  Review guidelines for sex with heart disease.   Cardiac Drugs I:  -Group instruction provided by verbal instruction and written materials to support subject.  Instructor reviews cardiac drug classes: antiplatelets, anticoagulants, beta blockers, and statins.  Instructor discusses reasons, side effects, and lifestyle considerations for each drug class.   Cardiac Drugs II:  -Group instruction provided by verbal instruction and written materials to support subject.  Instructor reviews cardiac drug classes: angiotensin converting enzyme inhibitors (ACE-I), angiotensin II receptor blockers (ARBs), nitrates, and calcium channel blockers.  Instructor discusses reasons, side effects, and lifestyle considerations for each drug class.   Anatomy and Physiology of the Circulatory System:  -Group instruction provided by verbal instruction, video, and written materials to support subject.  Reviews functional anatomy of heart, how it relates to various diagnoses, and what role the heart plays in the overall system.   Knowledge Questionnaire Score:     Knowledge Questionnaire Score - 08/20/16 1658       Knowledge Questionnaire Score   Pre Score 18/24      Core Components/Risk Factors/Patient Goals at Admission:  Personal Goals and Risk Factors at Admission - 08/20/16 1654      Core Components/Risk Factors/Patient Goals on Admission   Hypertension Yes   Intervention Provide education on lifestyle modifcations including regular physical activity/exercise, weight management, moderate sodium restriction and increased consumption of fresh fruit, vegetables, and low fat dairy, alcohol moderation, and smoking cessation.;Monitor prescription use compliance.   Expected Outcomes Short Term: Continued assessment and intervention until BP is < 140/54mm HG in hypertensive participants. < 130/63mm HG in hypertensive participants with diabetes, heart failure or chronic kidney disease.;Long Term: Maintenance of blood pressure at goal levels.   Stress Yes   Intervention Offer individual and/or small group education and counseling on adjustment to heart disease, stress management and health-related lifestyle change. Teach and support self-help strategies.   Expected Outcomes Short Term: Participant demonstrates changes in health-related behavior, relaxation and other stress management skills, ability to obtain effective social support, and compliance with psychotropic medications if prescribed.;Long Term: Emotional wellbeing is indicated by absence of clinically significant psychosocial distress or social isolation.   Personal Goal Other Yes   Personal Goal Increase energy.   Intervention Provide advice, counseling, support, and education about physical activity/exercise needs.   Expected Outcomes Achieve increased cardiorespiratory fitness shown through measurement of functional fitness and personal statement.      Core Components/Risk Factors/Patient Goals Review:    Core Components/Risk Factors/Patient Goals at Discharge (Final Review):    ITP Comments:     ITP Comments    Row Name 08/20/16  1654           ITP Comments Medical Director- Dr. Fransico Him, MD          Comments: Patient attended orientation from 1330 to 1530  to review rules and guidelines for program. Completed 6 minute walk test, Intitial ITP, and exercise prescription.  VSS. Telemetry-ventricular paced.   Asymptomatic.  Pt has many questions about medication cost and indication. Pt given information about Brilinta patient assistance program.  Pt instructed to contact cardiology office if she is unable to afford Brilinta to discuss alternative. Pt also given verbal information about her medication indications and given Cardiac Drugs I and II handouts.    Pt verbalized understanding of medication importance and do not stop medication without discussing with her physician.

## 2016-08-22 ENCOUNTER — Ambulatory Visit (INDEPENDENT_AMBULATORY_CARE_PROVIDER_SITE_OTHER): Payer: Medicare Other | Admitting: Rheumatology

## 2016-08-22 DIAGNOSIS — M19041 Primary osteoarthritis, right hand: Secondary | ICD-10-CM | POA: Diagnosis not present

## 2016-08-22 DIAGNOSIS — M5030 Other cervical disc degeneration, unspecified cervical region: Secondary | ICD-10-CM

## 2016-08-22 DIAGNOSIS — M19071 Primary osteoarthritis, right ankle and foot: Secondary | ICD-10-CM | POA: Diagnosis not present

## 2016-08-22 DIAGNOSIS — Z79899 Other long term (current) drug therapy: Secondary | ICD-10-CM | POA: Diagnosis not present

## 2016-08-22 DIAGNOSIS — M0579 Rheumatoid arthritis with rheumatoid factor of multiple sites without organ or systems involvement: Secondary | ICD-10-CM | POA: Diagnosis not present

## 2016-08-22 DIAGNOSIS — M5137 Other intervertebral disc degeneration, lumbosacral region: Secondary | ICD-10-CM

## 2016-08-23 ENCOUNTER — Other Ambulatory Visit: Payer: Self-pay | Admitting: Family Medicine

## 2016-08-23 DIAGNOSIS — Z1231 Encounter for screening mammogram for malignant neoplasm of breast: Secondary | ICD-10-CM

## 2016-08-26 ENCOUNTER — Encounter (HOSPITAL_COMMUNITY)
Admission: RE | Admit: 2016-08-26 | Discharge: 2016-08-26 | Disposition: A | Discharge status: Home / Self Care | Payer: Medicare Other | Source: Ambulatory Visit | Attending: Interventional Cardiology | Admitting: Interventional Cardiology

## 2016-08-26 DIAGNOSIS — I252 Old myocardial infarction: Secondary | ICD-10-CM | POA: Diagnosis not present

## 2016-08-26 DIAGNOSIS — I214 Non-ST elevation (NSTEMI) myocardial infarction: Secondary | ICD-10-CM

## 2016-08-26 NOTE — Progress Notes (Signed)
Daily Session Note  Patient Details  Name: [name] MRN: [id number] Date of Birth: 26-Jul-1946 Referring Provider:   Flowsheet Row CARDIAC REHAB PHASE II ORIENTATION from 08/20/2016 in [place]  Referring Provider  Daneen Schick MD      Encounter Date: 08/26/2016  Check In:     Session Check In - 08/26/16 1214      Check-In   Location [facility]   Staff Present Maurice Small, RN, BSN;Molly diVincenzo, MS, ACSM RCEP, Exercise Physiologist;Amber Fair, MS, ACSM RCEP, Exercise Physiologist;Olinty Celesta Aver, MS, ACSM CEP, Exercise Physiologist;Joann Rion, RN, BSN   Supervising physician immediately available to respond to emergencies [facility] immediately available   Physician(s) Dr. Waldron Labs   Medication changes reported     No   Fall or balance concerns reported    No   Warm-up and Cool-down Performed as group-led instruction   Resistance Training Performed Yes   VAD Patient? No     Pain Assessment   Currently in Pain? No/denies   Multiple Pain Sites No      Capillary Blood Glucose: No results found for this or any previous visit (from the past 24 hour(s)).   Goals Met:  Exercise tolerated well Personal goals reviewed  Goals Unmet:  Not Applicable  Comments:  Pt started cardiac rehab today.  Pt tolerated light exercise without difficulty. VSS, telemetry-paced, asymptomatic.  Medication list reconciled. Pt denies barriers to medication compliance.  PSYCHOSOCIAL ASSESSMENT:  PHQ-0. Pt exhibits positive coping skills, hopeful outlook with supportive family. No psychosocial needs identified at this time, no psychosocial interventions necessary.    Pt enjoys book club, gardening and shopping    Pt oriented to exercise equipment and routine. Pt plans to attend cardiac rehab twice a week due to copay.    Understanding verbalized.  Maurice Small RN, BSN     Dr. Fransico Him is Medical Director for Cardiac  Rehab at [facility].

## 2016-08-29 NOTE — Progress Notes (Signed)
Cardiac Individual Treatment Plan  Patient Details  Name: [name] MRN: [id number] Date of Birth: Mar 23, 1946 Referring Provider:   Flowsheet Row CARDIAC REHAB PHASE II ORIENTATION from 08/20/2016 in [place]  Referring Provider  Daneen Schick MD      Initial Encounter Date:  [place] from 08/20/2016 in [place]  Date  08/20/16  Referring Provider  Daneen Schick MD      Visit Diagnosis: 07/04/16 NSTEMI (non-ST elevated myocardial infarction) [facility])  Patient's Home Medications on Admission:  Current Outpatient Prescriptions:  .  acetaminophen (TYLENOL) 325 MG tablet, Take 2 tablets (650 mg total) by mouth every 4 (four) hours as needed for headache or mild pain., Disp: , Rfl:  .  albuterol (PROVENTIL HFA;VENTOLIN HFA) 108 (90 Base) MCG/ACT inhaler, Inhale into the lungs every 6 (six) hours as needed for wheezing or shortness of breath., Disp: , Rfl:  .  ALPRAZolam (NIRAVAM) 0.25 MG dissolvable tablet, Take 0.25 mg by mouth at bedtime as needed for anxiety., Disp: , Rfl:  .  aspirin 81 MG tablet, Take 81 mg by mouth daily.  , Disp: , Rfl:  .  atorvastatin (LIPITOR) 40 MG tablet, Take 1 tablet (40 mg total) by mouth daily at 6 PM., Disp: 90 tablet, Rfl: 3 .  budesonide-formoterol (SYMBICORT) 160-4.5 MCG/ACT inhaler, Inhale 2 puffs into the lungs 2 (two) times daily., Disp: , Rfl:  .  calcium carbonate (OS-CAL) 600 MG TABS, Take 600 mg by mouth 2 (two) times daily with a meal.  , Disp: , Rfl:  .  carvedilol (COREG) 3.125 MG tablet, Take 1 tablet (3.125 mg total) by mouth 2 (two) times daily., Disp: 180 tablet, Rfl: 3 .  cholecalciferol (VITAMIN D) 1000 UNITS tablet, Take 1,000 Units by mouth daily.  , Disp: , Rfl:  .  citalopram (CELEXA) 40 MG tablet, Take 40 mg by mouth daily.  , Disp: , Rfl:  .  clonazePAM (KLONOPIN) 0.5 MG tablet, Take 0.5 mg by mouth every 12 (twelve) hours as  needed for anxiety., Disp: , Rfl: 0 .  hydroxychloroquine (PLAQUENIL) 200 MG tablet, Take 200 mg by mouth See admin instructions. 200mg  twice daily Monday through Friday, Disp: , Rfl:  .  losartan (COZAAR) 25 MG tablet, Take 1 tablet (25 mg total) by mouth daily., Disp: 90 tablet, Rfl: 3 .  methotrexate (RHEUMATREX) 2.5 MG tablet, Take 15 mg by mouth once a week. On Saturday  Caution:Chemotherapy. Protect from light., Disp: , Rfl:  .  nitroGLYCERIN (NITROSTAT) 0.4 MG SL tablet, Place 1 tablet (0.4 mg total) under the tongue every 5 (five) minutes as needed for chest pain., Disp: 25 tablet, Rfl: 2 .  pantoprazole (PROTONIX) 40 MG tablet, Take 1 tablet (40 mg total) by mouth daily., Disp: 30 tablet, Rfl: 5 .  ticagrelor (BRILINTA) 90 MG TABS tablet, Take 1 tablet (90 mg total) by mouth 2 (two) times daily., Disp: 180 tablet, Rfl: 3  Past Medical History: Past Medical History:  Diagnosis Date  . Anxiety   . Asthma   . Breast cancer ([place])    left breast  . Depression   . Habitual alcohol use   . Hypertension   . RA (rheumatoid arthritis) ([place])   . Spinal stenosis     Tobacco Use: History  Smoking Status  . Former Smoker  Smokeless Tobacco  . Never Used    Comment: quit 1995    Labs:  Recent Review Flowsheet Data    Labs for ITP Cardiac and Pulmonary Rehab Latest Ref Rng & Units 07/05/2016   Cholestrol 0 - 200 mg/dL 166   LDLCALC 0 - 99 mg/dL 94   HDL >40 mg/dL 54   Trlycerides <150 mg/dL 88      Capillary Blood Glucose: Lab Results  Component Value Date   GLUCAP 118 (H) 07/06/2016     Exercise Target Goals:    Exercise Program Goal: Individual exercise prescription set with THRR, safety & activity barriers. Participant demonstrates ability to understand and report RPE using BORG scale, to self-measure pulse accurately, and to acknowledge the importance of the exercise prescription.  Exercise Prescription Goal: Starting with aerobic activity 30 plus minutes a day, 3  days per week for initial exercise prescription. Provide home exercise prescription and guidelines that participant acknowledges understanding prior to discharge.  Activity Barriers & Risk Stratification:     Activity Barriers & Cardiac Risk Stratification - 08/20/16 1454      Activity Barriers & Cardiac Risk Stratification   Activity Barriers Arthritis;Back Problems   Cardiac Risk Stratification High      6 Minute Walk:     6 Minute Walk    Row Name 08/20/16 1657         6 Minute Walk   Phase Initial     Distance 1163 feet     Walk Time 6 minutes     # of Rest Breaks 0     MPH 2.2     METS 2.42     RPE 11     VO2 Peak 8.46     Symptoms Yes (comment)     Comments Participant c/o bilateral thigh soreness during walk.     Resting HR 67 bpm     Resting BP 129/60     Max Ex. HR 89 bpm     Max Ex. BP 122/68     2 Minute Post BP 122/68        Initial Exercise Prescription:     Initial Exercise Prescription - 08/21/16 0800      Date of Initial Exercise RX and Referring Provider   Date 08/20/16   Referring Provider Daneen Schick MD     Bike   Level 0.5   Minutes 10   METs 2.45     NuStep   Level 1   Minutes 10   METs 2.4     Track   Laps 8   Minutes 10   METs 2.39     Prescription Details   Frequency (times per week) 2   Duration Progress to 30 minutes of continuous aerobic without signs/symptoms of physical distress     Intensity   THRR 40-80% of Max Heartrate 59-118   Ratings of Perceived Exertion 11-13   Perceived Dyspnea 0-4     Progression   Progression Continue to progress workloads to maintain intensity without signs/symptoms of physical distress.     Resistance Training   Training Prescription Yes   Weight 1lb   Reps 10-12      Perform Capillary Blood Glucose checks as needed.  Exercise Prescription Changes:     Exercise Prescription Changes    Row Name 08/28/16 1600             Response to Exercise   Blood Pressure  (Admit) 120/62       Blood Pressure (Exercise) 114/60       Blood Pressure (Exit) 110/60  Heart Rate (Admit) 88 bpm       Heart Rate (Exercise) 98 bpm       Heart Rate (Exit) 86 bpm       Rating of Perceived Exertion (Exercise) 13         Progression   Average METs 2.25         Resistance Training   Training Prescription Yes       Weight 1lb       Reps 10-12         Bike   Level 0.5       Minutes 10       METs 2.45         NuStep   Level 1       Minutes 1.9       METs 2.4         Track   Laps 11       Minutes 10       METs 2.39          Exercise Comments:     Exercise Comments    Row Name 08/28/16 1703           Exercise Comments Pt is doing well with exercise prescription          Discharge Exercise Prescription (Final Exercise Prescription Changes):     Exercise Prescription Changes - 08/28/16 1600      Response to Exercise   Blood Pressure (Admit) 120/62   Blood Pressure (Exercise) 114/60   Blood Pressure (Exit) 110/60   Heart Rate (Admit) 88 bpm   Heart Rate (Exercise) 98 bpm   Heart Rate (Exit) 86 bpm   Rating of Perceived Exertion (Exercise) 13     Progression   Average METs 2.25     Resistance Training   Training Prescription Yes   Weight 1lb   Reps 10-12     Bike   Level 0.5   Minutes 10   METs 2.45     NuStep   Level 1   Minutes 1.9   METs 2.4     Track   Laps 11   Minutes 10   METs 2.39      Nutrition:  Target Goals: Understanding of nutrition guidelines, daily intake of sodium 1500mg , cholesterol 200mg , calories 30% from fat and 7% or less from saturated fats, daily to have 5 or more servings of fruits and vegetables.  Biometrics:     Pre Biometrics - 08/20/16 1659      Pre Biometrics   Height 5\' 3"  (1.6 m)   Weight 143 lb 8.3 oz (65.1 kg)   Waist Circumference 33.75 inches   Hip Circumference 40.75 inches   Waist to Hip Ratio 0.83 %   BMI (Calculated) 25.5   Triceps Skinfold 18 mm   % Body Fat 35.5  %   Grip Strength 24 kg   Flexibility 11 in   Single Leg Stand 3.93 seconds       Nutrition Therapy Plan and Nutrition Goals:     Nutrition Therapy & Goals - 08/23/16 1150      Nutrition Therapy   Diet Therapeutic Lifestyle Changes     Intervention Plan   Intervention Prescribe, educate and counsel regarding individualized specific dietary modifications aiming towards targeted core components such as weight, hypertension, lipid management, diabetes, heart failure and other comorbidities.   Expected Outcomes Short Term Goal: Understand basic principles of dietary content, such as calories, fat, sodium, cholesterol and nutrients.;Long  Term Goal: Adherence to prescribed nutrition plan.      Nutrition Discharge: Nutrition Scores:     Nutrition Assessments - 08/23/16 1150      MEDFICTS Scores   Pre Score 42      Nutrition Goals Re-Evaluation:   Psychosocial: Target Goals: Acknowledge presence or absence of depression, maximize coping skills, provide positive support system. Participant is able to verbalize types and ability to use techniques and skills needed for reducing stress and depression.  Initial Review & Psychosocial Screening:   Quality of Life Scores:     Quality of Life - 08/20/16 1659      Quality of Life Scores   Health/Function Pre 19.18 %   Socioeconomic Pre 27 %   Psych/Spiritual Pre 21.63 %   Family Pre 21.75 %   GLOBAL Pre 21.65 %      PHQ-9: Recent Review Flowsheet Data    Depression screen PHQ 2/9 08/26/2016   Decreased Interest 0   Down, Depressed, Hopeless 0   PHQ - 2 Score 0      Psychosocial Evaluation and Intervention:     Psychosocial Evaluation - 08/29/16 1626      Psychosocial Evaluation & Interventions   Interventions Encouraged to exercise with the program and follow exercise prescription   Comments Pt with no psychosocial needs identified. No interventions needed.      Psychosocial Re-Evaluation:   Vocational  Rehabilitation: Provide vocational rehab assistance to qualifying candidates.   Vocational Rehab Evaluation & Intervention:   Education: Education Goals: Education classes will be provided on a weekly basis, covering required topics. Participant will state understanding/return demonstration of topics presented.  Learning Barriers/Preferences:     Learning Barriers/Preferences - 08/21/16 0809      Learning Barriers/Preferences   Learning Barriers None   Learning Preferences None      Education Topics: Count Your Pulse:  -Group instruction provided by verbal instruction, demonstration, patient participation and written materials to support subject.  Instructors address importance of being able to find your pulse and how to count your pulse when at home without a heart monitor.  Patients get hands on experience counting their pulse with staff help and individually.   Heart Attack, Angina, and Risk Factor Modification:  -Group instruction provided by verbal instruction, video, and written materials to support subject.  Instructors address signs and symptoms of angina and heart attacks.    Also discuss risk factors for heart disease and how to make changes to improve heart health risk factors.   Functional Fitness:  -Group instruction provided by verbal instruction, demonstration, patient participation, and written materials to support subject.  Instructors address safety measures for doing things around the house.  Discuss how to get up and down off the floor, how to pick things up properly, how to safely get out of a chair without assistance, and balance training.   Meditation and Mindfulness:  -Group instruction provided by verbal instruction, patient participation, and written materials to support subject.  Instructor addresses importance of mindfulness and meditation practice to help reduce stress and improve awareness.  Instructor also leads participants through a meditation  exercise.    Stretching for Flexibility and Mobility:  -Group instruction provided by verbal instruction, patient participation, and written materials to support subject.  Instructors lead participants through series of stretches that are designed to increase flexibility thus improving mobility.  These stretches are additional exercise for major muscle groups that are typically performed during regular warm up and cool down.  Hands Only CPR Anytime:  -Group instruction provided by verbal instruction, video, patient participation and written materials to support subject.  Instructors co-teach with AHA video for hands only CPR.  Participants get hands on experience with mannequins.   Nutrition I class: Heart Healthy Eating:  -Group instruction provided by PowerPoint slides, verbal discussion, and written materials to support subject matter. The instructor gives an explanation and review of the Therapeutic Lifestyle Changes diet recommendations, which includes a discussion on lipid goals, dietary fat, sodium, fiber, plant stanol/sterol esters, sugar, and the components of a well-balanced, healthy diet.   Nutrition II class: Lifestyle Skills:  -Group instruction provided by PowerPoint slides, verbal discussion, and written materials to support subject matter. The instructor gives an explanation and review of label reading, grocery shopping for heart health, heart healthy recipe modifications, and ways to make healthier choices when eating out.   Diabetes Question & Answer:  -Group instruction provided by PowerPoint slides, verbal discussion, and written materials to support subject matter. The instructor gives an explanation and review of diabetes co-morbidities, pre- and post-prandial blood glucose goals, pre-exercise blood glucose goals, signs, symptoms, and treatment of hypoglycemia and hyperglycemia, and foot care basics.   Diabetes Blitz:  -Group instruction provided by PowerPoint slides,  verbal discussion, and written materials to support subject matter. The instructor gives an explanation and review of the physiology behind type 1 and type 2 diabetes, diabetes medications and rational behind using different medications, pre- and post-prandial blood glucose recommendations and Hemoglobin A1c goals, diabetes diet, and exercise including blood glucose guidelines for exercising safely.    Portion Distortion:  -Group instruction provided by PowerPoint slides, verbal discussion, written materials, and food models to support subject matter. The instructor gives an explanation of serving size versus portion size, changes in portions sizes over the last 20 years, and what consists of a serving from each food group.   Stress Management:  -Group instruction provided by verbal instruction, video, and written materials to support subject matter.  Instructors review role of stress in heart disease and how to cope with stress positively.     Exercising on Your Own:  -Group instruction provided by verbal instruction, power point, and written materials to support subject.  Instructors discuss benefits of exercise, components of exercise, frequency and intensity of exercise, and end points for exercise.  Also discuss use of nitroglycerin and activating EMS.  Review options of places to exercise outside of rehab.  Review guidelines for sex with heart disease.   Cardiac Drugs I:  -Group instruction provided by verbal instruction and written materials to support subject.  Instructor reviews cardiac drug classes: antiplatelets, anticoagulants, beta blockers, and statins.  Instructor discusses reasons, side effects, and lifestyle considerations for each drug class.   Cardiac Drugs II:  -Group instruction provided by verbal instruction and written materials to support subject.  Instructor reviews cardiac drug classes: angiotensin converting enzyme inhibitors (ACE-I), angiotensin II receptor blockers  (ARBs), nitrates, and calcium channel blockers.  Instructor discusses reasons, side effects, and lifestyle considerations for each drug class.   Anatomy and Physiology of the Circulatory System:  -Group instruction provided by verbal instruction, video, and written materials to support subject.  Reviews functional anatomy of heart, how it relates to various diagnoses, and what role the heart plays in the overall system.   Knowledge Questionnaire Score:     Knowledge Questionnaire Score - 08/20/16 1658      Knowledge Questionnaire Score   Pre Score 18/24  Core Components/Risk Factors/Patient Goals at Admission:     Personal Goals and Risk Factors at Admission - 08/20/16 1654      Core Components/Risk Factors/Patient Goals on Admission   Hypertension Yes   Intervention Provide education on lifestyle modifcations including regular physical activity/exercise, weight management, moderate sodium restriction and increased consumption of fresh fruit, vegetables, and low fat dairy, alcohol moderation, and smoking cessation.;Monitor prescription use compliance.   Expected Outcomes Short Term: Continued assessment and intervention until BP is < 140/105mm HG in hypertensive participants. < 130/63mm HG in hypertensive participants with diabetes, heart failure or chronic kidney disease.;Long Term: Maintenance of blood pressure at goal levels.   Stress Yes   Intervention Offer individual and/or small group education and counseling on adjustment to heart disease, stress management and health-related lifestyle change. Teach and support self-help strategies.   Expected Outcomes Short Term: Participant demonstrates changes in health-related behavior, relaxation and other stress management skills, ability to obtain effective social support, and compliance with psychotropic medications if prescribed.;Long Term: Emotional wellbeing is indicated by absence of clinically significant psychosocial distress or  social isolation.   Personal Goal Other Yes   Personal Goal Increase energy.   Intervention Provide advice, counseling, support, and education about physical activity/exercise needs.   Expected Outcomes Achieve increased cardiorespiratory fitness shown through measurement of functional fitness and personal statement.      Core Components/Risk Factors/Patient Goals Review:      Goals and Risk Factor Review    Row Name 08/28/16 1655             Core Components/Risk Factors/Patient Goals Review   Personal Goals Review Other       Review Pt plans to walk at least 3 days/week at home       Expected Outcomes To increase energy and fitness level          Core Components/Risk Factors/Patient Goals at Discharge (Final Review):      Goals and Risk Factor Review - 08/28/16 1655      Core Components/Risk Factors/Patient Goals Review   Personal Goals Review Other   Review Pt plans to walk at least 3 days/week at home   Expected Outcomes To increase energy and fitness level      ITP Comments:     ITP Comments    Row Name 08/20/16 1654           ITP Comments Medical Director- Dr. Fransico Him, MD          Comments:  Pt is making expected progress toward personal goals after completing 2 sessions pt plans to attend 2 x week due to co pay.  Pt is off to a good start. Recommend continued exercise and life style modification education including  stress management and relaxation techniques to decrease cardiac risk profile. Cherre Huger, BSN

## 2016-08-30 ENCOUNTER — Encounter (HOSPITAL_COMMUNITY)
Admission: RE | Admit: 2016-08-30 | Discharge: 2016-08-30 | Disposition: A | Discharge status: Home / Self Care | Payer: Medicare Other | Source: Ambulatory Visit | Attending: Interventional Cardiology | Admitting: Interventional Cardiology

## 2016-08-30 DIAGNOSIS — I252 Old myocardial infarction: Secondary | ICD-10-CM | POA: Diagnosis not present

## 2016-08-30 DIAGNOSIS — I214 Non-ST elevation (NSTEMI) myocardial infarction: Secondary | ICD-10-CM

## 2016-09-02 ENCOUNTER — Encounter (HOSPITAL_COMMUNITY)
Admission: RE | Admit: 2016-09-02 | Discharge: 2016-09-02 | Disposition: A | Discharge status: Home / Self Care | Payer: Medicare Other | Source: Ambulatory Visit | Attending: Interventional Cardiology | Admitting: Interventional Cardiology

## 2016-09-02 DIAGNOSIS — I214 Non-ST elevation (NSTEMI) myocardial infarction: Secondary | ICD-10-CM

## 2016-09-02 DIAGNOSIS — I252 Old myocardial infarction: Secondary | ICD-10-CM | POA: Diagnosis not present

## 2016-09-02 NOTE — Progress Notes (Signed)
Reviewed home exercise guidelines with patient including endpoints, temperature precautions, target heart rate and rate of perceived exertion. Pt plans to walk as her mode of home exercise. Pt voices understanding of instructions given. [name] M Sanford Lindblad, MS, ACSM CCEP  

## 2016-09-04 ENCOUNTER — Encounter (HOSPITAL_COMMUNITY): Payer: Medicare Other

## 2016-09-06 ENCOUNTER — Encounter (HOSPITAL_COMMUNITY)
Admission: RE | Admit: 2016-09-06 | Discharge: 2016-09-06 | Disposition: A | Discharge status: Home / Self Care | Payer: Medicare Other | Source: Ambulatory Visit | Attending: Interventional Cardiology | Admitting: Interventional Cardiology

## 2016-09-06 DIAGNOSIS — I214 Non-ST elevation (NSTEMI) myocardial infarction: Secondary | ICD-10-CM

## 2016-09-09 ENCOUNTER — Encounter (HOSPITAL_COMMUNITY)
Admission: RE | Admit: 2016-09-09 | Discharge: 2016-09-09 | Disposition: A | Discharge status: Home / Self Care | Payer: Medicare Other | Source: Ambulatory Visit | Attending: Interventional Cardiology | Admitting: Interventional Cardiology

## 2016-09-09 DIAGNOSIS — I252 Old myocardial infarction: Secondary | ICD-10-CM | POA: Diagnosis not present

## 2016-09-09 DIAGNOSIS — I214 Non-ST elevation (NSTEMI) myocardial infarction: Secondary | ICD-10-CM

## 2016-09-11 ENCOUNTER — Encounter (HOSPITAL_COMMUNITY): Admission: RE | Admit: 2016-09-11 | Payer: Medicare Other | Source: Ambulatory Visit

## 2016-09-13 ENCOUNTER — Encounter (HOSPITAL_COMMUNITY)
Admission: RE | Admit: 2016-09-13 | Discharge: 2016-09-13 | Disposition: A | Discharge status: Home / Self Care | Payer: Medicare Other | Source: Ambulatory Visit | Attending: Interventional Cardiology | Admitting: Interventional Cardiology

## 2016-09-13 DIAGNOSIS — I214 Non-ST elevation (NSTEMI) myocardial infarction: Secondary | ICD-10-CM

## 2016-09-13 DIAGNOSIS — I252 Old myocardial infarction: Secondary | ICD-10-CM | POA: Diagnosis not present

## 2016-09-13 NOTE — Progress Notes (Signed)
QUALITY OF LIFE SCORE REVIEW   Pt completed Quality of Life survey as a participant in Cardiac Rehab. Scores 21.0 or below are considered low. Pt scored the following     Quality of Life - 08/20/16 1659      Quality of Life Scores   Health/Function Pre 19.18 %   Socioeconomic Pre 27 %   Psych/Spiritual Pre 21.63 %   Family Pre 21.75 %   GLOBAL Pre 21.65 %     Pt scored low with her health and function due to the impact her recent cardiac event had on her general wellbeing. Pt is hopeful that participating in cardiac rehab will improve how she feels and return her well being to baseline.   Offered emotional support and reassurance.  Will continue to monitor and intervene as necessary.  Maurice Small RN

## 2016-09-16 ENCOUNTER — Encounter (HOSPITAL_COMMUNITY)
Admission: RE | Admit: 2016-09-16 | Discharge: 2016-09-16 | Disposition: A | Discharge status: Home / Self Care | Payer: Medicare Other | Source: Ambulatory Visit | Attending: Interventional Cardiology | Admitting: Interventional Cardiology

## 2016-09-16 DIAGNOSIS — I252 Old myocardial infarction: Secondary | ICD-10-CM | POA: Diagnosis not present

## 2016-09-16 DIAGNOSIS — I214 Non-ST elevation (NSTEMI) myocardial infarction: Secondary | ICD-10-CM

## 2016-09-18 ENCOUNTER — Encounter (HOSPITAL_COMMUNITY): Payer: Medicare Other

## 2016-09-20 ENCOUNTER — Encounter (HOSPITAL_COMMUNITY)
Admission: RE | Admit: 2016-09-20 | Discharge: 2016-09-20 | Disposition: A | Discharge status: Home / Self Care | Payer: Medicare Other | Source: Ambulatory Visit | Attending: Interventional Cardiology | Admitting: Interventional Cardiology

## 2016-09-20 DIAGNOSIS — I252 Old myocardial infarction: Secondary | ICD-10-CM | POA: Diagnosis not present

## 2016-09-20 DIAGNOSIS — I509 Heart failure, unspecified: Secondary | ICD-10-CM | POA: Diagnosis not present

## 2016-09-20 DIAGNOSIS — I110 Hypertensive heart disease with heart failure: Secondary | ICD-10-CM | POA: Diagnosis not present

## 2016-09-20 DIAGNOSIS — Z7951 Long term (current) use of inhaled steroids: Secondary | ICD-10-CM | POA: Diagnosis not present

## 2016-09-20 DIAGNOSIS — I214 Non-ST elevation (NSTEMI) myocardial infarction: Secondary | ICD-10-CM

## 2016-09-20 DIAGNOSIS — J45909 Unspecified asthma, uncomplicated: Secondary | ICD-10-CM | POA: Diagnosis not present

## 2016-09-20 DIAGNOSIS — F329 Major depressive disorder, single episode, unspecified: Secondary | ICD-10-CM | POA: Insufficient documentation

## 2016-09-20 DIAGNOSIS — M069 Rheumatoid arthritis, unspecified: Secondary | ICD-10-CM | POA: Insufficient documentation

## 2016-09-20 DIAGNOSIS — Z9861 Coronary angioplasty status: Secondary | ICD-10-CM | POA: Insufficient documentation

## 2016-09-20 DIAGNOSIS — Z853 Personal history of malignant neoplasm of breast: Secondary | ICD-10-CM | POA: Insufficient documentation

## 2016-09-20 DIAGNOSIS — Z79899 Other long term (current) drug therapy: Secondary | ICD-10-CM | POA: Diagnosis not present

## 2016-09-20 DIAGNOSIS — Z7902 Long term (current) use of antithrombotics/antiplatelets: Secondary | ICD-10-CM | POA: Diagnosis not present

## 2016-09-20 DIAGNOSIS — Z87891 Personal history of nicotine dependence: Secondary | ICD-10-CM | POA: Insufficient documentation

## 2016-09-20 NOTE — Progress Notes (Signed)
I noticed that [name] has some scattered bruises on her bilateral lower legs and asked her about them during exercise today. "I fell on a piece of ice last night and hit the back of my head shoulder and tailbone." [name] said she had a small knot on the back of her head last night. I called my daughter over and put an ice pack on the back of my head. The swelling went away after I applied the ice. " [name] had almost completed exercise when I found out that she fell yesterday. Vital signs stable. Telemetry rhythm V Paced 79. I called Vin Bhagat PAC to notify.  [name] did not have any raised areas on her head today and reported no complaints with exercise. Vin advised [name] to call her primary care provider if further evaluation is needed. Patient states understanding and will call Dr Inda Merlin office if needed.Brandy Gave RN BSN

## 2016-09-21 ENCOUNTER — Emergency Department (HOSPITAL_COMMUNITY)
Admission: EM | Admit: 2016-09-21 | Discharge: 2016-09-21 | Disposition: A | Discharge status: Home / Self Care | Payer: Medicare Other | Attending: Emergency Medicine | Admitting: Emergency Medicine

## 2016-09-21 ENCOUNTER — Emergency Department (HOSPITAL_COMMUNITY): Payer: Medicare Other

## 2016-09-21 ENCOUNTER — Encounter (HOSPITAL_COMMUNITY): Payer: Self-pay | Admitting: Emergency Medicine

## 2016-09-21 DIAGNOSIS — S300XXA Contusion of lower back and pelvis, initial encounter: Secondary | ICD-10-CM

## 2016-09-21 DIAGNOSIS — S46812A Strain of other muscles, fascia and tendons at shoulder and upper arm level, left arm, initial encounter: Secondary | ICD-10-CM

## 2016-09-21 DIAGNOSIS — R51 Headache: Secondary | ICD-10-CM | POA: Diagnosis not present

## 2016-09-21 DIAGNOSIS — I10 Essential (primary) hypertension: Secondary | ICD-10-CM | POA: Diagnosis not present

## 2016-09-21 DIAGNOSIS — W19XXXA Unspecified fall, initial encounter: Secondary | ICD-10-CM

## 2016-09-21 DIAGNOSIS — S0003XA Contusion of scalp, initial encounter: Secondary | ICD-10-CM | POA: Diagnosis not present

## 2016-09-21 DIAGNOSIS — Y999 Unspecified external cause status: Secondary | ICD-10-CM | POA: Diagnosis not present

## 2016-09-21 DIAGNOSIS — J45909 Unspecified asthma, uncomplicated: Secondary | ICD-10-CM | POA: Diagnosis not present

## 2016-09-21 DIAGNOSIS — Z7982 Long term (current) use of aspirin: Secondary | ICD-10-CM | POA: Insufficient documentation

## 2016-09-21 DIAGNOSIS — Z950 Presence of cardiac pacemaker: Secondary | ICD-10-CM | POA: Diagnosis not present

## 2016-09-21 DIAGNOSIS — Y939 Activity, unspecified: Secondary | ICD-10-CM | POA: Diagnosis not present

## 2016-09-21 DIAGNOSIS — Y92000 Kitchen of unspecified non-institutional (private) residence as  the place of occurrence of the external cause: Secondary | ICD-10-CM | POA: Diagnosis not present

## 2016-09-21 DIAGNOSIS — W0110XA Fall on same level from slipping, tripping and stumbling with subsequent striking against unspecified object, initial encounter: Secondary | ICD-10-CM | POA: Insufficient documentation

## 2016-09-21 DIAGNOSIS — Z87891 Personal history of nicotine dependence: Secondary | ICD-10-CM | POA: Insufficient documentation

## 2016-09-21 DIAGNOSIS — S46912A Strain of unspecified muscle, fascia and tendon at shoulder and upper arm level, left arm, initial encounter: Secondary | ICD-10-CM | POA: Insufficient documentation

## 2016-09-21 DIAGNOSIS — Z853 Personal history of malignant neoplasm of breast: Secondary | ICD-10-CM | POA: Diagnosis not present

## 2016-09-21 DIAGNOSIS — S40012A Contusion of left shoulder, initial encounter: Secondary | ICD-10-CM

## 2016-09-21 DIAGNOSIS — I2510 Atherosclerotic heart disease of native coronary artery without angina pectoris: Secondary | ICD-10-CM | POA: Diagnosis not present

## 2016-09-21 DIAGNOSIS — I252 Old myocardial infarction: Secondary | ICD-10-CM | POA: Insufficient documentation

## 2016-09-21 DIAGNOSIS — S4992XA Unspecified injury of left shoulder and upper arm, initial encounter: Secondary | ICD-10-CM | POA: Diagnosis present

## 2016-09-21 LAB — CBC WITH DIFFERENTIAL/PLATELET
BASOS PCT: 2 %
Basophils Absolute: 0.1 10*3/uL (ref 0.0–0.1)
Eosinophils Absolute: 0.4 10*3/uL (ref 0.0–0.7)
Eosinophils Relative: 8 %
HEMATOCRIT: 40.3 % (ref 36.0–46.0)
HEMOGLOBIN: 13.4 g/dL (ref 12.0–15.0)
LYMPHS PCT: 16 %
Lymphs Abs: 0.8 10*3/uL (ref 0.7–4.0)
MCH: 30.9 pg (ref 26.0–34.0)
MCHC: 33.3 g/dL (ref 30.0–36.0)
MCV: 92.9 fL (ref 78.0–100.0)
MONO ABS: 0.5 10*3/uL (ref 0.1–1.0)
MONOS PCT: 10 %
NEUTROS ABS: 3.3 10*3/uL (ref 1.7–7.7)
NEUTROS PCT: 64 %
Platelets: 248 10*3/uL (ref 150–400)
RBC: 4.34 MIL/uL (ref 3.87–5.11)
RDW: 15.3 % (ref 11.5–15.5)
WBC: 5.1 10*3/uL (ref 4.0–10.5)

## 2016-09-21 LAB — BASIC METABOLIC PANEL
ANION GAP: 7 (ref 5–15)
BUN: 11 mg/dL (ref 6–20)
CHLORIDE: 103 mmol/L (ref 101–111)
CO2: 28 mmol/L (ref 22–32)
CREATININE: 0.72 mg/dL (ref 0.44–1.00)
Calcium: 9.3 mg/dL (ref 8.9–10.3)
GFR calc non Af Amer: >60 mL/min (ref >60)
GLUCOSE: 94 mg/dL (ref 65–99)
Potassium: 4 mmol/L (ref 3.5–5.1)
Sodium: 138 mmol/L (ref 135–145)

## 2016-09-21 MED ORDER — METHOCARBAMOL 500 MG PO TABS
500.0000 mg | ORAL_TABLET | Freq: Once | ORAL | Status: AC
  Administered 2016-09-21: 500 mg via ORAL
  Filled 2016-09-21: qty 1

## 2016-09-21 MED ORDER — TRAMADOL HCL 50 MG PO TABS: 50.0000 mg | ORAL_TABLET | Freq: Four times a day (QID) | ORAL | 0 refills | Status: DC | PRN

## 2016-09-21 MED ORDER — METHOCARBAMOL 500 MG PO TABS: 500.0000 mg | ORAL_TABLET | Freq: Three times a day (TID) | ORAL | 0 refills | Status: DC | PRN

## 2016-09-21 MED ORDER — OXYCODONE-ACETAMINOPHEN 5-325 MG PO TABS
1.0000 | ORAL_TABLET | Freq: Once | ORAL | Status: AC
  Administered 2016-09-21: 1 via ORAL
  Filled 2016-09-21: qty 1

## 2016-09-21 NOTE — ED Triage Notes (Signed)
Pt states she slipped on a piece of ice in her kitchen two days ago and fell against a door and then to the floor. Pt denies any LOC. Pt did hit back of head on door and left shoulder and tail bone. Pt states she fell onto her right side and c/o of pain in right elbow. Pt is on brilinta. Has hematoma to left side back of head, left shoulder and right elbow. Pt c/o of headache with no dizziness, vision changes. Also c/o of tail bone pain.

## 2016-09-21 NOTE — ED Provider Notes (Signed)
[facility] DEPT Provider Note   CSN: [id number] Arrival date & time: 09/21/16  1027     History   Chief Complaint Chief Complaint  Patient presents with  . Fall    HPI [name] is a 71 y.o. female with a hx of CAD, on brillinta, and ASA, presents to the emergency department for evaluation after a fall which occurred on 09/19/2016 wherein the patient slipped on an ice cube in her kitchen and fell backwards against a door frame striking her head, left posterior shoulder, right elbow on the door frame, and landed hard on her sacrum on the floor. She denies any LOC, amnesia of the event. She states that she has had some obvious bruising over the impacted areas, and presents to the emergency department noting gradual worsening of her pain in her left shoulder radiating up the distribution of her left trapezius. She states she's been taking Tylenol intermittently to no avail of her symptoms.She states that her symptoms are significantly worse with movement and palpation. She has had pain with sitting due to bruising over her tailbone, but denies any difficulty walking, hip pain, lower extremity injuries.   HPI  Past Medical History:  Diagnosis Date  . Anxiety   . Asthma   . Breast cancer ([place])    left breast  . Depression   . Habitual alcohol use   . Hypertension   . RA (rheumatoid arthritis) ([place])   . Spinal stenosis     Patient Active Problem List   Diagnosis Date Noted  . Syncope and collapse 07/07/2016  . Cardiomyopathy, ischemic 07/07/2016  . CAD- S/P LAD thrombectomy 07/04/16 07/07/2016  . Pacemaker-MDT BiV placed 07/05/16 07/07/2016  . History of rheumatoid arthritis 07/07/2016  . Complete heart block ([place]) 07/04/2016  . Essential hypertension 07/04/2016  . LBBB (left bundle branch block) 07/04/2016  . Ventricular tachycardia ([place]) 07/04/2016  . Faintness 07/04/2016  . NSTEMI (non-ST elevated myocardial infarction) ([place]) 07/04/2016  . Exostosis 06/18/2011  .  History of breast cancer 02/16/2010  . VITAMIN D DEFICIENCY 02/15/2010  . HYPERCHOLESTEROLEMIA 02/15/2010  . ANXIETY 02/15/2010  . Asthma 02/15/2010  . MIGRAINES, HX OF 02/15/2010  . MASTECTOMY, LEFT, HX OF 02/15/2010    Past Surgical History:  Procedure Laterality Date  . BREAST LUMPECTOMY  2005   left  . CARDIAC CATHETERIZATION N/A 07/04/2016   Procedure: Left Heart Cath and Coronary Angiography;  Surgeon: Belva Crome, MD;  Location: [facility];  Service: Cardiovascular;  Laterality: N/A;  . CARDIAC CATHETERIZATION N/A 07/04/2016   Procedure: Temporary Pacemaker;  Surgeon: Belva Crome, MD;  Location: [facility];  Service: Cardiovascular;  Laterality: N/A;  . EP IMPLANTABLE DEVICE N/A 07/05/2016   Procedure: Pacemaker Implant;  Surgeon: Evans Lance, MD;  Location: [facility];  Service: Cardiovascular;  Laterality: N/A;  . [place]  . LYMPHECTOMY  2005    OB History    No data available       Home Medications    Prior to Admission medications   Medication Sig Start Date End Date Taking? Authorizing Provider  acetaminophen (TYLENOL) 325 MG tablet Take 2 tablets (650 mg total) by mouth every 4 (four) hours as needed for headache or mild pain. 07/07/16  Yes Luke K Kilroy, PA-C  albuterol (PROVENTIL HFA;VENTOLIN HFA) 108 (90 Base) MCG/ACT inhaler Inhale into the lungs every 6 (six) hours as needed for wheezing or shortness of breath.   Yes Historical Provider,  MD  aspirin 81 MG tablet Take 81 mg by mouth daily.     Yes Historical Provider, MD  atorvastatin (LIPITOR) 40 MG tablet Take 1 tablet (40 mg total) by mouth daily at 6 PM. 07/07/16  Yes Arbutus Leas, NP  budesonide-formoterol (SYMBICORT) 160-4.5 MCG/ACT inhaler Inhale 2 puffs into the lungs 2 (two) times daily.   Yes Historical Provider, MD  calcium carbonate (OS-CAL) 600 MG TABS Take 600 mg by mouth 2 (two) times daily with a meal.     Yes Historical Provider, MD  carvedilol (COREG) 3.125  MG tablet Take 1 tablet (3.125 mg total) by mouth 2 (two) times daily. 07/17/16 10/15/16 Yes Eileen Stanford, PA-C  cholecalciferol (VITAMIN D) 1000 UNITS tablet Take 1,000 Units by mouth daily.     Yes Historical Provider, MD  citalopram (CELEXA) 40 MG tablet Take 40 mg by mouth daily.     Yes Historical Provider, MD  clonazePAM (KLONOPIN) 0.5 MG tablet Take 0.5 mg by mouth every 12 (twelve) hours as needed for anxiety. 06/12/16  Yes Historical Provider, MD  hydroxychloroquine (PLAQUENIL) 200 MG tablet Take 200 mg by mouth See admin instructions. 200mg  twice daily Monday through Friday   Yes Historical Provider, MD  losartan (COZAAR) 25 MG tablet Take 1 tablet (25 mg total) by mouth daily. 08/06/16 11/04/16 Yes Eileen Stanford, PA-C  methotrexate (RHEUMATREX) 2.5 MG tablet Take 15 mg by mouth every Saturday. On Saturday  Caution:Chemotherapy. Protect from light.   Yes Historical Provider, MD  pantoprazole (PROTONIX) 40 MG tablet Take 1 tablet (40 mg total) by mouth daily. 07/07/16  Yes Arbutus Leas, NP  ticagrelor (BRILINTA) 90 MG TABS tablet Take 1 tablet (90 mg total) by mouth 2 (two) times daily. 07/07/16  Yes Arbutus Leas, NP  methocarbamol (ROBAXIN) 500 MG tablet Take 1 tablet (500 mg total) by mouth every 8 (eight) hours as needed for muscle spasms. 09/21/16   Zenovia Jarred, DO  nitroGLYCERIN (NITROSTAT) 0.4 MG SL tablet Place 1 tablet (0.4 mg total) under the tongue every 5 (five) minutes as needed for chest pain. 07/07/16   Arbutus Leas, NP  traMADol (ULTRAM) 50 MG tablet Take 1 tablet (50 mg total) by mouth every 6 (six) hours as needed for severe pain. 09/21/16   Zenovia Jarred, DO    Family History Family History  Problem Relation Age of Onset  . Stroke Mother   . Cancer Father     colon?  . Cancer Paternal Aunt   . Cancer Paternal Uncle     Social History Social History  Substance Use Topics  . Smoking status: Former Research scientist (life sciences)  . Smokeless tobacco: Never Used     Comment: quit  1995  . Alcohol use 12.6 oz/week    21 Cans of beer per week     Comment: 3 DAILY     Allergies   Betadine [povidone iodine] and Hydrocodone   Review of Systems Review of Systems  Constitutional: Positive for activity change. Negative for appetite change, chills and fever.  Eyes: Negative for visual disturbance.  Respiratory: Negative for cough, chest tightness and shortness of breath.   Cardiovascular: Negative for chest pain and palpitations.  Gastrointestinal: Negative for abdominal pain, nausea and vomiting.  Genitourinary: Positive for pelvic pain (sacral pain from fall). Negative for difficulty urinating, dysuria, frequency, hematuria and urgency.  Musculoskeletal: Positive for arthralgias (left shoulder, right elbow), back pain, myalgias (left trapezius musculature) and neck pain. Negative for gait  problem, joint swelling and neck stiffness.  Skin: Positive for wound (multiple contusions, but no lacerations). Negative for rash.  Neurological: Positive for headaches. Negative for dizziness, seizures, syncope, facial asymmetry, speech difficulty, weakness, light-headedness and numbness.  All other systems reviewed and are negative.    Physical Exam Updated Vital Signs BP 140/84   Pulse 64   Temp 98 F (36.7 C) (Oral)   Resp 17   Ht 5\' 2"  (1.575 m)   Wt 63.5 kg   SpO2 97%   BMI 25.61 kg/m   Physical Exam  Constitutional: She is oriented to person, place, and time. She appears well-developed and well-nourished. No distress.  HENT:  Head: Normocephalic.    Right Ear: External ear normal.  Left Ear: External ear normal.  Nose: Nose normal.  Mouth/Throat: Oropharynx is clear and moist.  No hemotympanum or hemoseptum  Eyes: Conjunctivae and EOM are normal. Pupils are equal, round, and reactive to light.  Neck: Normal range of motion. Neck supple.  Cardiovascular: Normal rate, regular rhythm, normal heart sounds and intact distal pulses.   Pulmonary/Chest: Effort  normal and breath sounds normal. She exhibits no tenderness.  Abdominal: Soft. She exhibits no distension. There is no tenderness.  Musculoskeletal: She exhibits tenderness.       Right elbow: She exhibits swelling (mild, over posterior lateral elbow with contusion). She exhibits normal range of motion, no deformity and no laceration. Tenderness found. Lateral epicondyle tenderness noted.       Back:       Arms: No midline C/T/L spine tenderness. Mild tenderness over sacral contusion  Neurological: She is alert and oriented to person, place, and time. She has normal strength. No cranial nerve deficit or sensory deficit. Coordination normal. GCS eye subscore is 4. GCS verbal subscore is 5. GCS motor subscore is 6.  Intact finger to nose and heel to shin  Skin: Skin is warm and dry. She is not diaphoretic.  Nursing note and vitals reviewed.    ED Treatments / Results  Labs (all labs ordered are listed, but only abnormal results are displayed) Labs Reviewed  CBC WITH DIFFERENTIAL/PLATELET  BASIC METABOLIC PANEL    EKG  EKG Interpretation  Date/Time:  Saturday September 21 2016 12:13:44 EDT Ventricular Rate:  64 PR Interval:    QRS Duration: 126 QT Interval:  494 QTC Calculation: 510 R Axis:   -120 Text Interpretation:  Sinus rhythm Nonspecific IVCD with LAD Abnrm T, consider ischemia, anterolateral lds ST elevation, consider inferior injury Baseline wander in lead(s) I V1 V2 atrial paced. QRS morphology changed laterally compared to old Confirmed by Johnney Killian, MD, Jeannie Done [phone]) on 09/21/2016 12:49:27 PM Also confirmed by Johnney Killian, MD, Jeannie Done [phone]), editor [place], Leda Gauze [phone])  on 09/21/2016 1:11:54 PM       Radiology Dg Pelvis 1-2 Views  Result Date: 09/21/2016 CLINICAL DATA:  Fall on Thursday, hematoma to back of head, generalized LEFT shoulder tenderness and pain, swelling and bruising to the posterior RIGHT elbow, initial encounter EXAM: PELVIS - 1-2 VIEW COMPARISON:   01/23/2007 FINDINGS: Mild osseous demineralization. Hip and SI joint spaces symmetric and preserved. No acute fracture, dislocation, or bone destruction. Tiny spur at RIGHT femoral head. IMPRESSION: No acute osseous abnormalities. Electronically Signed   By: Lavonia Dana M.D.   On: 09/21/2016 12:11   Dg Elbow 2 Views Right  Result Date: 09/21/2016 CLINICAL DATA:  Fall on Thursday, hematoma to back of head, generalized LEFT shoulder tenderness and pain, swelling and bruising to the  posterior RIGHT elbow, initial encounter EXAM: RIGHT ELBOW - 2 VIEW COMPARISON:  None FINDINGS: Mild scattered elbow joint space narrowing and spurring. Osseous demineralization. IV catheter projects over region. Scattered chondrocalcinosis question CPPD. No acute fracture, dislocation, or bone destruction. IMPRESSION: Degenerative changes and question CPPD RIGHT elbow joint. No acute abnormalities. Electronically Signed   By: Lavonia Dana M.D.   On: 09/21/2016 12:14   Ct Head Wo Contrast  Result Date: 09/21/2016 CLINICAL DATA:  70 year old with recent fall and scalp hematoma. EXAM: CT HEAD WITHOUT CONTRAST CT CERVICAL SPINE WITHOUT CONTRAST TECHNIQUE: Multidetector CT imaging of the head and cervical spine was performed following the standard protocol without intravenous contrast. Multiplanar CT image reconstructions of the cervical spine were also generated. COMPARISON:  09/10/2016 and 01/23/2007 FINDINGS: CT HEAD FINDINGS Brain: Mild cerebral atrophy, particularly along the frontal lobes. No evidence for acute hemorrhage, mass lesion, midline shift, hydrocephalus or large infarct. Vascular: No hyperdense vessel or unexpected calcification. Skull: No evidence for calvarial fracture. Irregularity of the mandibular condyles bilaterally. Evidence for heterotopic bone formation at the temporomandibular joints, right side greater than left. Sinuses/Orbits: Mastoid air cells are aerated. Mild mucosal disease in the ethmoid air cells.  Other: None CT CERVICAL SPINE FINDINGS Alignment: Mild retrolisthesis at C4-C5. This retrolisthesis is similar to the previous examination. Normal alignment at the cervical-thoracic junction. Skull base and vertebrae: Again noted is incomplete closure along the posterior ring of C1 which is chronic. Negative for an acute fracture or dislocation. Soft tissues and spinal canal: No prevertebral fluid or swelling. No visible canal hematoma. Disc levels: There is a disc space narrowing at C3-C4, C4-C5 and C5-C6. There is bilateral facet arthropathy. There is some facet ankylosis at C7-T1 bilaterally. Upper chest: Limited evaluation. Other: None IMPRESSION: No acute intracranial abnormality. Multilevel degenerative changes in cervical spine without acute bone abnormality. Degenerative changes at the temporomandibular joints bilaterally. Electronically Signed   By: Markus Daft M.D.   On: 09/21/2016 12:39   Ct Cervical Spine Wo Contrast  Result Date: 09/21/2016 CLINICAL DATA:  70 year old with recent fall and scalp hematoma. EXAM: CT HEAD WITHOUT CONTRAST CT CERVICAL SPINE WITHOUT CONTRAST TECHNIQUE: Multidetector CT imaging of the head and cervical spine was performed following the standard protocol without intravenous contrast. Multiplanar CT image reconstructions of the cervical spine were also generated. COMPARISON:  09/10/2016 and 01/23/2007 FINDINGS: CT HEAD FINDINGS Brain: Mild cerebral atrophy, particularly along the frontal lobes. No evidence for acute hemorrhage, mass lesion, midline shift, hydrocephalus or large infarct. Vascular: No hyperdense vessel or unexpected calcification. Skull: No evidence for calvarial fracture. Irregularity of the mandibular condyles bilaterally. Evidence for heterotopic bone formation at the temporomandibular joints, right side greater than left. Sinuses/Orbits: Mastoid air cells are aerated. Mild mucosal disease in the ethmoid air cells. Other: None CT CERVICAL SPINE FINDINGS  Alignment: Mild retrolisthesis at C4-C5. This retrolisthesis is similar to the previous examination. Normal alignment at the cervical-thoracic junction. Skull base and vertebrae: Again noted is incomplete closure along the posterior ring of C1 which is chronic. Negative for an acute fracture or dislocation. Soft tissues and spinal canal: No prevertebral fluid or swelling. No visible canal hematoma. Disc levels: There is a disc space narrowing at C3-C4, C4-C5 and C5-C6. There is bilateral facet arthropathy. There is some facet ankylosis at C7-T1 bilaterally. Upper chest: Limited evaluation. Other: None IMPRESSION: No acute intracranial abnormality. Multilevel degenerative changes in cervical spine without acute bone abnormality. Degenerative changes at the temporomandibular joints bilaterally. Electronically Signed  By: Markus Daft M.D.   On: 09/21/2016 12:39   Dg Shoulder Left  Result Date: 09/21/2016 CLINICAL DATA:  Fall on Thursday, hematoma to back of head, generalized LEFT shoulder tenderness and pain, swelling and bruising to the posterior RIGHT elbow, initial encounter EXAM: LEFT SHOULDER - 2+ VIEW COMPARISON:  None FINDINGS: Bones appear demineralized. AC joint alignment normal. Limitation from LEFT subclavian pacemaker leads and generator projecting over region. No glenohumeral fracture, dislocation, or bone destruction. Visualized LEFT ribs intact. IMPRESSION: No acute LEFT shoulder abnormalities. Electronically Signed   By: Lavonia Dana M.D.   On: 09/21/2016 12:10    Procedures Procedures (including critical care time)  Medications Ordered in ED Medications  methocarbamol (ROBAXIN) tablet 500 mg (500 mg Oral Given 09/21/16 1127)  oxyCODONE-acetaminophen (PERCOCET/ROXICET) 5-325 MG per tablet 1 tablet (1 tablet Oral Given 09/21/16 1127)     Initial Impression / Assessment and Plan / ED Course  I have reviewed the triage vital signs and the nursing notes.  Pertinent labs & imaging results  that were available during my care of the patient were reviewed by me and considered in my medical decision making (see chart for details).  Clinical Course    70 year old female on brillenta and aspirin presents to the emergency department for evaluation after a fall which occurred on Thursday. Exam as above with contusions noted.  Labs were drawn and returned showing.  X-rays were done and showed no acute fracture in her right elbow, left shoulder, pelvis/sacrum.   CT head and C-spine were done to further evaluate and returned showing no acute intracranial abnormalities or acute c-spine injuries.   She was rx'd tramadol and robaxin for symptomatic relief and was recommended to follow up with her PCP to monitor for improvement in her sx. Return precautions were given for worsening or concerns. This plan was discussed with the patient at the bedside and she stated both understanding and agreement with this plan.   Final Clinical Impressions(s) / ED Diagnoses   Final diagnoses:  Fall  Contusion of left shoulder, initial encounter  Contusion of scalp, initial encounter  Sacral contusion, initial encounter  Strain of left trapezius muscle, initial encounter    New Prescriptions Discharge Medication List as of 09/21/2016  1:22 PM    START taking these medications   Details  methocarbamol (ROBAXIN) 500 MG tablet Take 1 tablet (500 mg total) by mouth every 8 (eight) hours as needed for muscle spasms., Starting Sat 09/21/2016, Print    traMADol (ULTRAM) 50 MG tablet Take 1 tablet (50 mg total) by mouth every 6 (six) hours as needed for severe pain., Starting Sat 09/21/2016, Print         Zenovia Jarred, DO 09/21/16 1952    Charlesetta Shanks, MD 10/01/16 [phone]

## 2016-09-21 NOTE — ED Notes (Signed)
Pt transported to [facility]

## 2016-09-21 NOTE — ED Notes (Signed)
ED Provider at bedside. 

## 2016-09-23 ENCOUNTER — Encounter (HOSPITAL_COMMUNITY)
Admission: RE | Admit: 2016-09-23 | Discharge: 2016-09-23 | Disposition: A | Discharge status: Home / Self Care | Payer: Medicare Other | Source: Ambulatory Visit | Attending: Interventional Cardiology | Admitting: Interventional Cardiology

## 2016-09-23 DIAGNOSIS — I214 Non-ST elevation (NSTEMI) myocardial infarction: Secondary | ICD-10-CM

## 2016-09-23 DIAGNOSIS — I252 Old myocardial infarction: Secondary | ICD-10-CM | POA: Diagnosis not present

## 2016-09-25 ENCOUNTER — Encounter (HOSPITAL_COMMUNITY): Payer: Medicare Other

## 2016-09-26 NOTE — Progress Notes (Signed)
Cardiac Individual Treatment Plan  Patient Details  Name: [name] MRN: [id number] Date of Birth: 30-May-1946 Referring Provider:   Flowsheet Row CARDIAC REHAB PHASE II ORIENTATION from 08/20/2016 in [facility]  Referring Provider  Daneen Schick MD      Initial Encounter Date:  [place] from 08/20/2016 in [place]  Date  08/20/16  Referring Provider  Daneen Schick MD      Visit Diagnosis: 07/04/16 NSTEMI (non-ST elevated myocardial infarction) [facility])  Patient's Home Medications on Admission:  Current Outpatient Prescriptions:  .  acetaminophen (TYLENOL) 325 MG tablet, Take 2 tablets (650 mg total) by mouth every 4 (four) hours as needed for headache or mild pain., Disp: , Rfl:  .  albuterol (PROVENTIL HFA;VENTOLIN HFA) 108 (90 Base) MCG/ACT inhaler, Inhale into the lungs every 6 (six) hours as needed for wheezing or shortness of breath., Disp: , Rfl:  .  aspirin 81 MG tablet, Take 81 mg by mouth daily.  , Disp: , Rfl:  .  atorvastatin (LIPITOR) 40 MG tablet, Take 1 tablet (40 mg total) by mouth daily at 6 PM., Disp: 90 tablet, Rfl: 3 .  budesonide-formoterol (SYMBICORT) 160-4.5 MCG/ACT inhaler, Inhale 2 puffs into the lungs 2 (two) times daily., Disp: , Rfl:  .  calcium carbonate (OS-CAL) 600 MG TABS, Take 600 mg by mouth 2 (two) times daily with a meal.  , Disp: , Rfl:  .  carvedilol (COREG) 3.125 MG tablet, Take 1 tablet (3.125 mg total) by mouth 2 (two) times daily., Disp: 180 tablet, Rfl: 3 .  cholecalciferol (VITAMIN D) 1000 UNITS tablet, Take 1,000 Units by mouth daily.  , Disp: , Rfl:  .  citalopram (CELEXA) 40 MG tablet, Take 40 mg by mouth daily.  , Disp: , Rfl:  .  clonazePAM (KLONOPIN) 0.5 MG tablet, Take 0.5 mg by mouth every 12 (twelve) hours as needed for anxiety., Disp: , Rfl: 0 .  hydroxychloroquine (PLAQUENIL) 200 MG tablet, Take 200 mg by mouth See admin  instructions. 200mg  twice daily Monday through Friday, Disp: , Rfl:  .  losartan (COZAAR) 25 MG tablet, Take 1 tablet (25 mg total) by mouth daily., Disp: 90 tablet, Rfl: 3 .  methocarbamol (ROBAXIN) 500 MG tablet, Take 1 tablet (500 mg total) by mouth every 8 (eight) hours as needed for muscle spasms., Disp: 10 tablet, Rfl: 0 .  methotrexate (RHEUMATREX) 2.5 MG tablet, Take 15 mg by mouth every Saturday. On Saturday  Caution:Chemotherapy. Protect from light., Disp: , Rfl:  .  nitroGLYCERIN (NITROSTAT) 0.4 MG SL tablet, Place 1 tablet (0.4 mg total) under the tongue every 5 (five) minutes as needed for chest pain., Disp: 25 tablet, Rfl: 2 .  pantoprazole (PROTONIX) 40 MG tablet, Take 1 tablet (40 mg total) by mouth daily., Disp: 30 tablet, Rfl: 5 .  ticagrelor (BRILINTA) 90 MG TABS tablet, Take 1 tablet (90 mg total) by mouth 2 (two) times daily., Disp: 180 tablet, Rfl: 3 .  traMADol (ULTRAM) 50 MG tablet, Take 1 tablet (50 mg total) by mouth every 6 (six) hours as needed for severe pain., Disp: 10 tablet, Rfl: 0  Past Medical History: Past Medical History:  Diagnosis Date  . Anxiety   . Asthma   . Breast cancer ([place])    left breast  . Depression   . Habitual alcohol use   . Hypertension   . RA (rheumatoid arthritis) ([place])   .  Spinal stenosis     Tobacco Use: History  Smoking Status  . Former Smoker  Smokeless Tobacco  . Never Used    Comment: quit 1995    Labs: Recent Review Scientist, physiological    Labs for ITP Cardiac and Pulmonary Rehab Latest Ref Rng & Units 07/05/2016   Cholestrol 0 - 200 mg/dL 166   LDLCALC 0 - 99 mg/dL 94   HDL >40 mg/dL 54   Trlycerides <150 mg/dL 88      Capillary Blood Glucose: Lab Results  Component Value Date   GLUCAP 118 (H) 07/06/2016     Exercise Target Goals:    Exercise Program Goal: Individual exercise prescription set with THRR, safety & activity barriers. Participant demonstrates ability to understand and report RPE using BORG scale,  to self-measure pulse accurately, and to acknowledge the importance of the exercise prescription.  Exercise Prescription Goal: Starting with aerobic activity 30 plus minutes a day, 3 days per week for initial exercise prescription. Provide home exercise prescription and guidelines that participant acknowledges understanding prior to discharge.  Activity Barriers & Risk Stratification:     Activity Barriers & Cardiac Risk Stratification - 08/20/16 1454      Activity Barriers & Cardiac Risk Stratification   Activity Barriers Arthritis;Back Problems   Cardiac Risk Stratification High      6 Minute Walk:     6 Minute Walk    Row Name 08/20/16 1657         6 Minute Walk   Phase Initial     Distance 1163 feet     Walk Time 6 minutes     # of Rest Breaks 0     MPH 2.2     METS 2.42     RPE 11     VO2 Peak 8.46     Symptoms Yes (comment)     Comments Participant c/o bilateral thigh soreness during walk.     Resting HR 67 bpm     Resting BP 129/60     Max Ex. HR 89 bpm     Max Ex. BP 122/68     2 Minute Post BP 122/68        Initial Exercise Prescription:     Initial Exercise Prescription - 08/21/16 0800      Date of Initial Exercise RX and Referring Provider   Date 08/20/16   Referring Provider Daneen Schick MD     Bike   Level 0.5   Minutes 10   METs 2.45     NuStep   Level 1   Minutes 10   METs 2.4     Track   Laps 8   Minutes 10   METs 2.39     Prescription Details   Frequency (times per week) 2   Duration Progress to 30 minutes of continuous aerobic without signs/symptoms of physical distress     Intensity   THRR 40-80% of Max Heartrate 59-118   Ratings of Perceived Exertion 11-13   Perceived Dyspnea 0-4     Progression   Progression Continue to progress workloads to maintain intensity without signs/symptoms of physical distress.     Resistance Training   Training Prescription Yes   Weight 1lb   Reps 10-12      Perform Capillary Blood  Glucose checks as needed.  Exercise Prescription Changes:     Exercise Prescription Changes    Row Name 08/28/16 1600 09/13/16 1400 09/24/16 1100  Exercise Review   Progression  - Yes -       Response to Exercise   Blood Pressure (Admit) 120/62 116/60 120/62     Blood Pressure (Exercise) 114/60 110/60 128/60     Blood Pressure (Exit) 110/60 116/58 112/64     Heart Rate (Admit) 88 bpm 78 bpm 87 bpm     Heart Rate (Exercise) 98 bpm 103 bpm 105 bpm     Heart Rate (Exit) 86 bpm 78 bpm 92 bpm     Rating of Perceived Exertion (Exercise) 13 12 12      Symptoms  - none none     Comments  - Reviewed home exercise guidelines on  09/02/16. Reviewed home exercise guidelines on  09/02/16.     Duration  - Progress to 30 minutes of continuous aerobic without signs/symptoms of physical distress Progress to 30 minutes of continuous aerobic without signs/symptoms of physical distress     Intensity  - THRR unchanged THRR unchanged       Progression   Progression  - Continue to progress workloads to maintain intensity without signs/symptoms of physical distress. Continue to progress workloads to maintain intensity without signs/symptoms of physical distress.     Average METs 2.25 2.8 2.7       Resistance Training   Training Prescription Yes Yes Yes     Weight 1lb 2lbs 2lbs     Reps 10-12 -  -       Interval Training   Interval Training  - No No       Bike   Level 0.5 0.5 0.5     Minutes 10 10 10      METs 2.45 2.47 2.47       NuStep   Level 1 3 3      Minutes 1.9 10 10      METs 2.4 3.4 3.3       Track   Laps 11 10 11      Minutes 10 10 10      METs 2.39 2.74 2.92       Home Exercise Plan   Plans to continue exercise at  - Home Home     Frequency  - Add 2 additional days to program exercise sessions. Add 2 additional days to program exercise sessions.        Exercise Comments:     Exercise Comments    Row Name 08/28/16 1703 09/02/16 1400 09/13/16 1435       Exercise  Comments Pt is doing well with exercise prescription Reviewed home exercies guidelines with participant. She plans to walk starting at 20 minutes 1-2 days per week and gradually increase to 30 minutes 1-2 days per week. Participant is walking 25 minutes, 2 days per week at home.        Discharge Exercise Prescription (Final Exercise Prescription Changes):     Exercise Prescription Changes - 09/24/16 1100      Exercise Review   Progression --     Response to Exercise   Blood Pressure (Admit) 120/62   Blood Pressure (Exercise) 128/60   Blood Pressure (Exit) 112/64   Heart Rate (Admit) 87 bpm   Heart Rate (Exercise) 105 bpm   Heart Rate (Exit) 92 bpm   Rating of Perceived Exertion (Exercise) 12   Symptoms none   Comments Reviewed home exercise guidelines on  09/02/16.   Duration Progress to 30 minutes of continuous aerobic without signs/symptoms of physical distress   Intensity THRR unchanged  Progression   Progression Continue to progress workloads to maintain intensity without signs/symptoms of physical distress.   Average METs 2.7     Resistance Training   Training Prescription Yes   Weight 2lbs     Interval Training   Interval Training No     Bike   Level 0.5   Minutes 10   METs 2.47     NuStep   Level 3   Minutes 10   METs 3.3     Track   Laps 11   Minutes 10   METs 2.92     Home Exercise Plan   Plans to continue exercise at Home   Frequency Add 2 additional days to program exercise sessions.      Nutrition:  Target Goals: Understanding of nutrition guidelines, daily intake of sodium 1500mg , cholesterol 200mg , calories 30% from fat and 7% or less from saturated fats, daily to have 5 or more servings of fruits and vegetables.  Biometrics:     Pre Biometrics - 08/20/16 1659      Pre Biometrics   Height 5\' 3"  (1.6 m)   Weight 143 lb 8.3 oz (65.1 kg)   Waist Circumference 33.75 inches   Hip Circumference 40.75 inches   Waist to Hip Ratio 0.83 %    BMI (Calculated) 25.5   Triceps Skinfold 18 mm   % Body Fat 35.5 %   Grip Strength 24 kg   Flexibility 11 in   Single Leg Stand 3.93 seconds       Nutrition Therapy Plan and Nutrition Goals:     Nutrition Therapy & Goals - 08/23/16 1150      Nutrition Therapy   Diet Therapeutic Lifestyle Changes     Intervention Plan   Intervention Prescribe, educate and counsel regarding individualized specific dietary modifications aiming towards targeted core components such as weight, hypertension, lipid management, diabetes, heart failure and other comorbidities.   Expected Outcomes Short Term Goal: Understand basic principles of dietary content, such as calories, fat, sodium, cholesterol and nutrients.;Long Term Goal: Adherence to prescribed nutrition plan.      Nutrition Discharge: Nutrition Scores:     Nutrition Assessments - 08/23/16 1150      MEDFICTS Scores   Pre Score 42      Nutrition Goals Re-Evaluation:   Psychosocial: Target Goals: Acknowledge presence or absence of depression, maximize coping skills, provide positive support system. Participant is able to verbalize types and ability to use techniques and skills needed for reducing stress and depression.  Initial Review & Psychosocial Screening:   Quality of Life Scores:     Quality of Life - 08/20/16 1659      Quality of Life Scores   Health/Function Pre 19.18 %   Socioeconomic Pre 27 %   Psych/Spiritual Pre 21.63 %   Family Pre 21.75 %   GLOBAL Pre 21.65 %      PHQ-9: Recent Review Flowsheet Data    Depression screen PHQ 2/9 08/26/2016   Decreased Interest 0   Down, Depressed, Hopeless 0   PHQ - 2 Score 0      Psychosocial Evaluation and Intervention:     Psychosocial Evaluation - 09/26/16 1516      Psychosocial Evaluation & Interventions   Interventions Encouraged to exercise with the program and follow exercise prescription   Comments Pt with no psychosocial needs identified. No  interventions needed.      Psychosocial Re-Evaluation:     Psychosocial Re-Evaluation    [place] Name 09/26/16  1516             Psychosocial Re-Evaluation   Interventions Encouraged to attend Cardiac Rehabilitation for the exercise          Vocational Rehabilitation: Provide vocational rehab assistance to qualifying candidates.   Vocational Rehab Evaluation & Intervention:   Education: Education Goals: Education classes will be provided on a weekly basis, covering required topics. Participant will state understanding/return demonstration of topics presented.  Learning Barriers/Preferences:     Learning Barriers/Preferences - 08/21/16 0809      Learning Barriers/Preferences   Learning Barriers None   Learning Preferences None      Education Topics: Count Your Pulse:  -Group instruction provided by verbal instruction, demonstration, patient participation and written materials to support subject.  Instructors address importance of being able to find your pulse and how to count your pulse when at home without a heart monitor.  Patients get hands on experience counting their pulse with staff help and individually. Flowsheet Row CARDIAC REHAB PHASE II EXERCISE from 09/20/2016 in [facility]  Date  09/20/16  Educator  Andi Hence, RN  Instruction Review Code  2- meets goals/outcomes      Heart Attack, Angina, and Risk Factor Modification:  -Group instruction provided by verbal instruction, video, and written materials to support subject.  Instructors address signs and symptoms of angina and heart attacks.    Also discuss risk factors for heart disease and how to make changes to improve heart health risk factors.   Functional Fitness:  -Group instruction provided by verbal instruction, demonstration, patient participation, and written materials to support subject.  Instructors address safety measures for doing things around the house.  Discuss  how to get up and down off the floor, how to pick things up properly, how to safely get out of a chair without assistance, and balance training.   Meditation and Mindfulness:  -Group instruction provided by verbal instruction, patient participation, and written materials to support subject.  Instructor addresses importance of mindfulness and meditation practice to help reduce stress and improve awareness.  Instructor also leads participants through a meditation exercise.    Stretching for Flexibility and Mobility:  -Group instruction provided by verbal instruction, patient participation, and written materials to support subject.  Instructors lead participants through series of stretches that are designed to increase flexibility thus improving mobility.  These stretches are additional exercise for major muscle groups that are typically performed during regular warm up and cool down. Flowsheet Row CARDIAC REHAB PHASE II EXERCISE from 09/20/2016 in [facility]  Date  09/13/16  Educator  Seward Carol  Instruction Review Code  2- meets goals/outcomes      Hands Only CPR Anytime:  -Group instruction provided by verbal instruction, video, patient participation and written materials to support subject.  Instructors co-teach with AHA video for hands only CPR.  Participants get hands on experience with mannequins.   Nutrition I class: Heart Healthy Eating:  -Group instruction provided by PowerPoint slides, verbal discussion, and written materials to support subject matter. The instructor gives an explanation and review of the Therapeutic Lifestyle Changes diet recommendations, which includes a discussion on lipid goals, dietary fat, sodium, fiber, plant stanol/sterol esters, sugar, and the components of a well-balanced, healthy diet.   Nutrition II class: Lifestyle Skills:  -Group instruction provided by PowerPoint slides, verbal discussion, and written materials to  support subject matter. The instructor gives an explanation and review of label reading,  grocery shopping for heart health, heart healthy recipe modifications, and ways to make healthier choices when eating out.   Diabetes Question & Answer:  -Group instruction provided by PowerPoint slides, verbal discussion, and written materials to support subject matter. The instructor gives an explanation and review of diabetes co-morbidities, pre- and post-prandial blood glucose goals, pre-exercise blood glucose goals, signs, symptoms, and treatment of hypoglycemia and hyperglycemia, and foot care basics. Flowsheet Row CARDIAC REHAB PHASE II EXERCISE from 09/20/2016 in [facility]  Date  08/30/16  Educator  RD  Instruction Review Code  2- meets goals/outcomes      Diabetes Blitz:  -Group instruction provided by PowerPoint slides, verbal discussion, and written materials to support subject matter. The instructor gives an explanation and review of the physiology behind type 1 and type 2 diabetes, diabetes medications and rational behind using different medications, pre- and post-prandial blood glucose recommendations and Hemoglobin A1c goals, diabetes diet, and exercise including blood glucose guidelines for exercising safely.    Portion Distortion:  -Group instruction provided by PowerPoint slides, verbal discussion, written materials, and food models to support subject matter. The instructor gives an explanation of serving size versus portion size, changes in portions sizes over the last 20 years, and what consists of a serving from each food group.   Stress Management:  -Group instruction provided by verbal instruction, video, and written materials to support subject matter.  Instructors review role of stress in heart disease and how to cope with stress positively.     Exercising on Your Own:  -Group instruction provided by verbal instruction, power point, and written  materials to support subject.  Instructors discuss benefits of exercise, components of exercise, frequency and intensity of exercise, and end points for exercise.  Also discuss use of nitroglycerin and activating EMS.  Review options of places to exercise outside of rehab.  Review guidelines for sex with heart disease. Flowsheet Row CARDIAC REHAB PHASE II EXERCISE from 09/20/2016 in [facility]  Date  09/06/16  Educator  Seward Carol  Instruction Review Code  2- meets goals/outcomes      Cardiac Drugs I:  -Group instruction provided by verbal instruction and written materials to support subject.  Instructor reviews cardiac drug classes: antiplatelets, anticoagulants, beta blockers, and statins.  Instructor discusses reasons, side effects, and lifestyle considerations for each drug class.   Cardiac Drugs II:  -Group instruction provided by verbal instruction and written materials to support subject.  Instructor reviews cardiac drug classes: angiotensin converting enzyme inhibitors (ACE-I), angiotensin II receptor blockers (ARBs), nitrates, and calcium channel blockers.  Instructor discusses reasons, side effects, and lifestyle considerations for each drug class.   Anatomy and Physiology of the Circulatory System:  -Group instruction provided by verbal instruction, video, and written materials to support subject.  Reviews functional anatomy of heart, how it relates to various diagnoses, and what role the heart plays in the overall system.   Knowledge Questionnaire Score:     Knowledge Questionnaire Score - 08/20/16 1658      Knowledge Questionnaire Score   Pre Score 18/24      Core Components/Risk Factors/Patient Goals at Admission:     Personal Goals and Risk Factors at Admission - 08/20/16 1654      Core Components/Risk Factors/Patient Goals on Admission   Hypertension Yes   Intervention Provide education on lifestyle modifcations including  regular physical activity/exercise, weight management, moderate sodium restriction and increased consumption of fresh fruit, vegetables,  and low fat dairy, alcohol moderation, and smoking cessation.;Monitor prescription use compliance.   Expected Outcomes Short Term: Continued assessment and intervention until BP is < 140/77mm HG in hypertensive participants. < 130/2mm HG in hypertensive participants with diabetes, heart failure or chronic kidney disease.;Long Term: Maintenance of blood pressure at goal levels.   Stress Yes   Intervention Offer individual and/or small group education and counseling on adjustment to heart disease, stress management and health-related lifestyle change. Teach and support self-help strategies.   Expected Outcomes Short Term: Participant demonstrates changes in health-related behavior, relaxation and other stress management skills, ability to obtain effective social support, and compliance with psychotropic medications if prescribed.;Long Term: Emotional wellbeing is indicated by absence of clinically significant psychosocial distress or social isolation.   Personal Goal Other Yes   Personal Goal Increase energy.   Intervention Provide advice, counseling, support, and education about physical activity/exercise needs.   Expected Outcomes Achieve increased cardiorespiratory fitness shown through measurement of functional fitness and personal statement.      Core Components/Risk Factors/Patient Goals Review:      Goals and Risk Factor Review    Row Name 08/28/16 1655 09/13/16 1435           Core Components/Risk Factors/Patient Goals Review   Personal Goals Review Other Other      Review Pt plans to walk at least 3 days/week at home Energy level hasn't increased yet, but participant has begun home exercise 2 days per week in addition to 2 days per week at cardiac rehab.      Expected Outcomes To increase energy and fitness level Increase workloads to help increase  energy and fitness level.         Core Components/Risk Factors/Patient Goals at Discharge (Final Review):      Goals and Risk Factor Review - 09/13/16 1435      Core Components/Risk Factors/Patient Goals Review   Personal Goals Review Other   Review Energy level hasn't increased yet, but participant has begun home exercise 2 days per week in addition to 2 days per week at cardiac rehab.   Expected Outcomes Increase workloads to help increase energy and fitness level.      ITP Comments:     ITP Comments    Row Name 08/20/16 1654           ITP Comments Medical Director- Dr. Fransico Him, MD          Comments:  Pt is making expected progress toward personal goals after completing 10 sessions Pt attends 2 x week consistently.  Pt had a fall at home and is recovering nicely.  Psychosocial Assessment - No needs identified, no further interventions warranted. Recommend continued exercise and life style modification education including  stress management and relaxation techniques to decrease cardiac risk profile. Cherre Huger, BSN

## 2016-09-27 ENCOUNTER — Encounter (HOSPITAL_COMMUNITY)
Admission: RE | Admit: 2016-09-27 | Discharge: 2016-09-27 | Disposition: A | Discharge status: Home / Self Care | Payer: Medicare Other | Source: Ambulatory Visit | Attending: Interventional Cardiology | Admitting: Interventional Cardiology

## 2016-09-27 DIAGNOSIS — I214 Non-ST elevation (NSTEMI) myocardial infarction: Secondary | ICD-10-CM

## 2016-09-27 DIAGNOSIS — I252 Old myocardial infarction: Secondary | ICD-10-CM | POA: Diagnosis not present

## 2016-09-30 ENCOUNTER — Encounter (HOSPITAL_COMMUNITY)
Admission: RE | Admit: 2016-09-30 | Discharge: 2016-09-30 | Disposition: A | Discharge status: Home / Self Care | Payer: Medicare Other | Source: Ambulatory Visit | Attending: Interventional Cardiology | Admitting: Interventional Cardiology

## 2016-09-30 DIAGNOSIS — I252 Old myocardial infarction: Secondary | ICD-10-CM | POA: Diagnosis not present

## 2016-09-30 DIAGNOSIS — I214 Non-ST elevation (NSTEMI) myocardial infarction: Secondary | ICD-10-CM

## 2016-10-01 ENCOUNTER — Other Ambulatory Visit: Payer: Self-pay | Admitting: Physician Assistant

## 2016-10-01 NOTE — Telephone Encounter (Signed)
Patient calling the office for samples of medication:   1.  What medication and dosage are you requesting samples for? Brilnta for 10 days please until pt has an appointment with Dr Tamala Julian 2.  Are you currently out of this medication? no

## 2016-10-01 NOTE — Telephone Encounter (Signed)
Called pt to inform her that I was leaving samples of Brilinta 90 mg tablet, 3 bottles and an application for assistance at the front desk to pick up and that the pt could return the application to [employer], RN, person that does the prior [place]. I advised the pt that if she has any other problems, questions or concerns to call the office. Pt verbalized understanding.  Brilinta 90 mg     EXP: 01-2019        LOT# [id number]

## 2016-10-02 ENCOUNTER — Encounter (HOSPITAL_COMMUNITY)
Admission: RE | Admit: 2016-10-02 | Discharge: 2016-10-02 | Disposition: A | Discharge status: Home / Self Care | Payer: Medicare Other | Source: Ambulatory Visit | Attending: Interventional Cardiology | Admitting: Interventional Cardiology

## 2016-10-02 DIAGNOSIS — I214 Non-ST elevation (NSTEMI) myocardial infarction: Secondary | ICD-10-CM

## 2016-10-03 ENCOUNTER — Other Ambulatory Visit: Payer: Self-pay | Admitting: Rheumatology

## 2016-10-04 ENCOUNTER — Encounter: Payer: Self-pay | Admitting: Interventional Cardiology

## 2016-10-04 ENCOUNTER — Other Ambulatory Visit: Payer: Self-pay

## 2016-10-04 ENCOUNTER — Ambulatory Visit (HOSPITAL_COMMUNITY): Payer: Medicare Other | Attending: Physician Assistant

## 2016-10-04 ENCOUNTER — Encounter (HOSPITAL_COMMUNITY)
Admission: RE | Admit: 2016-10-04 | Discharge: 2016-10-04 | Disposition: A | Discharge status: Home / Self Care | Payer: Medicare Other | Source: Ambulatory Visit | Attending: Interventional Cardiology | Admitting: Interventional Cardiology

## 2016-10-04 ENCOUNTER — Telehealth: Payer: Self-pay | Admitting: Rheumatology

## 2016-10-04 DIAGNOSIS — I10 Essential (primary) hypertension: Secondary | ICD-10-CM | POA: Insufficient documentation

## 2016-10-04 DIAGNOSIS — I081 Rheumatic disorders of both mitral and tricuspid valves: Secondary | ICD-10-CM | POA: Insufficient documentation

## 2016-10-04 DIAGNOSIS — I255 Ischemic cardiomyopathy: Secondary | ICD-10-CM

## 2016-10-04 DIAGNOSIS — I214 Non-ST elevation (NSTEMI) myocardial infarction: Secondary | ICD-10-CM

## 2016-10-04 DIAGNOSIS — Z853 Personal history of malignant neoplasm of breast: Secondary | ICD-10-CM | POA: Diagnosis not present

## 2016-10-04 DIAGNOSIS — I252 Old myocardial infarction: Secondary | ICD-10-CM | POA: Diagnosis not present

## 2016-10-04 NOTE — Telephone Encounter (Signed)
Left message to advise patient prescription has been sent to the pharmacy.  

## 2016-10-04 NOTE — Telephone Encounter (Signed)
Okay to refill MTX  for 62months supply That will mean disp: 72 pills

## 2016-10-04 NOTE — Telephone Encounter (Signed)
Patient is requesting refill of MTX be sent to [facility] on [address]. Patient states she needs the medication for tomorrow.

## 2016-10-04 NOTE — Telephone Encounter (Signed)
Last Visit: 09/01/16 Next Visit due in March 2018 Labs: 08/23/2016 WNL  Okay to refill WNL

## 2016-10-04 NOTE — Telephone Encounter (Signed)
Okay to refill MTX?

## 2016-10-07 ENCOUNTER — Encounter (HOSPITAL_COMMUNITY)
Admission: RE | Admit: 2016-10-07 | Discharge: 2016-10-07 | Disposition: A | Discharge status: Home / Self Care | Payer: Medicare Other | Source: Ambulatory Visit | Attending: Interventional Cardiology | Admitting: Interventional Cardiology

## 2016-10-07 DIAGNOSIS — I214 Non-ST elevation (NSTEMI) myocardial infarction: Secondary | ICD-10-CM

## 2016-10-07 DIAGNOSIS — I252 Old myocardial infarction: Secondary | ICD-10-CM | POA: Diagnosis not present

## 2016-10-08 ENCOUNTER — Encounter: Payer: Medicare Other | Admitting: Internal Medicine

## 2016-10-09 ENCOUNTER — Encounter (HOSPITAL_COMMUNITY): Payer: Medicare Other

## 2016-10-14 ENCOUNTER — Encounter (HOSPITAL_COMMUNITY)
Admission: RE | Admit: 2016-10-14 | Discharge: 2016-10-14 | Disposition: A | Discharge status: Home / Self Care | Payer: Medicare Other | Source: Ambulatory Visit | Attending: Interventional Cardiology | Admitting: Interventional Cardiology

## 2016-10-14 DIAGNOSIS — I252 Old myocardial infarction: Secondary | ICD-10-CM | POA: Diagnosis not present

## 2016-10-14 DIAGNOSIS — I214 Non-ST elevation (NSTEMI) myocardial infarction: Secondary | ICD-10-CM

## 2016-10-16 ENCOUNTER — Ambulatory Visit (INDEPENDENT_AMBULATORY_CARE_PROVIDER_SITE_OTHER): Payer: Medicare Other | Admitting: Internal Medicine

## 2016-10-16 ENCOUNTER — Encounter: Payer: Self-pay | Admitting: Internal Medicine

## 2016-10-16 ENCOUNTER — Encounter (HOSPITAL_COMMUNITY): Payer: Medicare Other

## 2016-10-16 ENCOUNTER — Ambulatory Visit (INDEPENDENT_AMBULATORY_CARE_PROVIDER_SITE_OTHER): Payer: Medicare Other | Admitting: Interventional Cardiology

## 2016-10-16 ENCOUNTER — Encounter: Payer: Self-pay | Admitting: Interventional Cardiology

## 2016-10-16 VITALS — BP 120/60 | HR 77 | Ht 62.0 in | Wt 141.2 lb

## 2016-10-16 DIAGNOSIS — I442 Atrioventricular block, complete: Secondary | ICD-10-CM

## 2016-10-16 DIAGNOSIS — I10 Essential (primary) hypertension: Secondary | ICD-10-CM | POA: Diagnosis not present

## 2016-10-16 DIAGNOSIS — I255 Ischemic cardiomyopathy: Secondary | ICD-10-CM | POA: Diagnosis not present

## 2016-10-16 DIAGNOSIS — Z950 Presence of cardiac pacemaker: Secondary | ICD-10-CM | POA: Diagnosis not present

## 2016-10-16 DIAGNOSIS — Z9861 Coronary angioplasty status: Secondary | ICD-10-CM

## 2016-10-16 DIAGNOSIS — I2510 Atherosclerotic heart disease of native coronary artery without angina pectoris: Secondary | ICD-10-CM

## 2016-10-16 DIAGNOSIS — I214 Non-ST elevation (NSTEMI) myocardial infarction: Secondary | ICD-10-CM

## 2016-10-16 DIAGNOSIS — I472 Ventricular tachycardia, unspecified: Secondary | ICD-10-CM

## 2016-10-16 LAB — CUP PACEART INCLINIC DEVICE CHECK
Battery Remaining Longevity: 89 mo
Battery Voltage: 3.03 V
Brady Statistic AS VS Percent: 0.01 %
Implantable Lead Implant Date: 20170818
Implantable Lead Location: 753859
Implantable Lead Location: 753860
Implantable Lead Model: 4396
Implantable Lead Model: 5076
Implantable Lead Model: 5076
Lead Channel Impedance Value: 551 Ohm
Lead Channel Pacing Threshold Amplitude: 0.5 V
Lead Channel Pacing Threshold Amplitude: 0.75 V
Lead Channel Pacing Threshold Pulse Width: 0.4 ms
Lead Channel Pacing Threshold Pulse Width: 0.4 ms
Lead Channel Sensing Intrinsic Amplitude: 4 mV
Lead Channel Setting Pacing Amplitude: 1.75 V
Lead Channel Setting Pacing Amplitude: 2 V
Lead Channel Setting Pacing Pulse Width: 0.4 ms
MDC IDC LEAD IMPLANT DT: 20170818
MDC IDC LEAD IMPLANT DT: 20170818
MDC IDC LEAD LOCATION: 753858
MDC IDC MSMT LEADCHNL LV IMPEDANCE VALUE: 399 Ohm
MDC IDC MSMT LEADCHNL LV IMPEDANCE VALUE: 418 Ohm
MDC IDC MSMT LEADCHNL LV IMPEDANCE VALUE: 513 Ohm
MDC IDC MSMT LEADCHNL LV IMPEDANCE VALUE: 532 Ohm
MDC IDC MSMT LEADCHNL LV IMPEDANCE VALUE: 722 Ohm
MDC IDC MSMT LEADCHNL RA IMPEDANCE VALUE: 342 Ohm
MDC IDC MSMT LEADCHNL RA IMPEDANCE VALUE: 475 Ohm
MDC IDC MSMT LEADCHNL RA PACING THRESHOLD AMPLITUDE: 0.75 V
MDC IDC MSMT LEADCHNL RA PACING THRESHOLD PULSEWIDTH: 0.4 ms
MDC IDC MSMT LEADCHNL RA SENSING INTR AMPL: 2.5 mV
MDC IDC MSMT LEADCHNL RV IMPEDANCE VALUE: 646 Ohm
MDC IDC PG IMPLANT DT: 20170818
MDC IDC SESS DTM: 20171129175256
MDC IDC SET LEADCHNL RA PACING AMPLITUDE: 1.5 V
MDC IDC SET LEADCHNL RV PACING PULSEWIDTH: 0.4 ms
MDC IDC SET LEADCHNL RV SENSING SENSITIVITY: 0.9 mV
MDC IDC STAT BRADY AP VP PERCENT: 0.06 %
MDC IDC STAT BRADY AP VS PERCENT: 0.01 %
MDC IDC STAT BRADY AS VP PERCENT: 99.93 %
MDC IDC STAT BRADY RA PERCENT PACED: 0.07 %
MDC IDC STAT BRADY RV PERCENT PACED: 99.91 %

## 2016-10-16 NOTE — Progress Notes (Signed)
Cardiology Office Note    Date:  10/16/2016   ID:  [name], DOB 1946/08/05, MRN [id number]  PCP:  Marjorie Smolder, MD  Cardiologist: Brandy Grooms, MD   Chief Complaint  Patient presents with  . Congestive Heart Failure    History of Present Illness:  [name] is a 70 y.o. female with a history of breast Ca, HTN, RA, depression and recently diagnosed CAD s/p thrombotic occlusion of the LAD s/p thrombectomy (07/04/16), VT and CHB s/p PPM who presents to clinic for post hospital follow up.  She is doing well. She has had no recurrent syncope. Complete heart block was treated with permanent pacemaker. Increased LV function has improved back to an ejection fraction of 45-50%. She had an acute ischemic event during heart catheterization related to catheter induced embolic episode. Thrombus was aspirated with resolution of symptoms. She was placed on dual antiplatelet therapy for several months. She has had no recurrence of clinical symptoms. She is having increased bruising on Brilinta.   Past Medical History:  Diagnosis Date  . Anxiety   . Asthma   . Breast cancer ([place])    left breast  . Depression   . Habitual alcohol use   . Hypertension   . RA (rheumatoid arthritis) ([place])   . Spinal stenosis     Past Surgical History:  Procedure Laterality Date  . BREAST LUMPECTOMY  2005   left  . CARDIAC CATHETERIZATION N/A 07/04/2016   Procedure: Left Heart Cath and Coronary Angiography;  Surgeon: Belva Crome, MD;  Location: [facility];  Service: Cardiovascular;  Laterality: N/A;  . CARDIAC CATHETERIZATION N/A 07/04/2016   Procedure: Temporary Pacemaker;  Surgeon: Belva Crome, MD;  Location: [facility];  Service: Cardiovascular;  Laterality: N/A;  . EP IMPLANTABLE DEVICE N/A 07/05/2016   Procedure: Pacemaker Implant;  Surgeon: Evans Lance, MD;  Location: [facility];  Service: Cardiovascular;  Laterality: N/A;  . [place]  .  LYMPHECTOMY  2005    Current Medications: Outpatient Medications Prior to Visit  Medication Sig Dispense Refill  . acetaminophen (TYLENOL) 325 MG tablet Take 2 tablets (650 mg total) by mouth every 4 (four) hours as needed for headache or mild pain.    Marland Kitchen albuterol (PROVENTIL HFA;VENTOLIN HFA) 108 (90 Base) MCG/ACT inhaler Inhale into the lungs every 6 (six) hours as needed for wheezing or shortness of breath.    Marland Kitchen aspirin 81 MG tablet Take 81 mg by mouth daily.      Marland Kitchen atorvastatin (LIPITOR) 40 MG tablet Take 1 tablet (40 mg total) by mouth daily at 6 PM. 90 tablet 3  . budesonide-formoterol (SYMBICORT) 160-4.5 MCG/ACT inhaler Inhale 2 puffs into the lungs 2 (two) times daily.    . calcium carbonate (OS-CAL) 600 MG TABS Take 600 mg by mouth 2 (two) times daily with a meal.      . cholecalciferol (VITAMIN D) 1000 UNITS tablet Take 1,000 Units by mouth daily.      . citalopram (CELEXA) 40 MG tablet Take 40 mg by mouth daily.      . clonazePAM (KLONOPIN) 0.5 MG tablet Take 0.5 mg by mouth every 12 (twelve) hours as needed for anxiety.  0  . hydroxychloroquine (PLAQUENIL) 200 MG tablet Take 200 mg by mouth See admin instructions. 200mg  twice daily Monday through Friday    . losartan (COZAAR) 25 MG tablet Take 1 tablet (25 mg total) by mouth daily.  90 tablet 3  . methotrexate (RHEUMATREX) 2.5 MG tablet TAKE 6 TABLETS BY MOUTH 1 TIME WEEKLY 72 tablet 0  . nitroGLYCERIN (NITROSTAT) 0.4 MG SL tablet Place 1 tablet (0.4 mg total) under the tongue every 5 (five) minutes as needed for chest pain. 25 tablet 2  . pantoprazole (PROTONIX) 40 MG tablet Take 1 tablet (40 mg total) by mouth daily. 30 tablet 5  . ticagrelor (BRILINTA) 90 MG TABS tablet Take 1 tablet (90 mg total) by mouth 2 (two) times daily. 180 tablet 3  . carvedilol (COREG) 3.125 MG tablet Take 1 tablet (3.125 mg total) by mouth 2 (two) times daily. 180 tablet 3  . methocarbamol (ROBAXIN) 500 MG tablet Take 1 tablet (500 mg total) by mouth  every 8 (eight) hours as needed for muscle spasms. (Patient not taking: Reported on 10/16/2016) 10 tablet 0  . traMADol (ULTRAM) 50 MG tablet Take 1 tablet (50 mg total) by mouth every 6 (six) hours as needed for severe pain. (Patient not taking: Reported on 10/16/2016) 10 tablet 0   No facility-administered medications prior to visit.      Allergies:   Betadine [povidone iodine] and Hydrocodone   Social History   Social History  . Marital status: Married    Spouse name: N/A  . Number of children: N/A  . Years of education: N/A   Social History Main Topics  . Smoking status: Former Research scientist (life sciences)  . Smokeless tobacco: Never Used     Comment: quit 1995  . Alcohol use 12.6 oz/week    21 Cans of beer per week     Comment: 3 DAILY  . Drug use: No  . Sexual activity: Not Asked   Other Topics Concern  . None   Social History Narrative  . None     Family History:  The patient's family history includes Cancer in her father, paternal aunt, and paternal uncle; Stroke in her mother.   ROS:   Please see the history of present illness.    Excessive bruising. Fatigue. No chest discomfort. No syncope.  All other systems reviewed and are negative.   PHYSICAL EXAM:   VS:  BP 120/60   Pulse 77   Ht 5\' 2"  (1.575 m)   Wt 141 lb 3.2 oz (64 kg)   SpO2 96%   BMI 25.83 kg/m    GEN: Well nourished, well developed, in no acute distress . Some ecchymoses and bruising. HEENT: normal  Neck: no JVD, carotid bruits, or masses Cardiac: RRR; no murmurs, rubs, or gallops,no edema. Pacer pocket looks normal.  Respiratory:  clear to auscultation bilaterally, normal work of breathing GI: soft, nontender, nondistended, + BS MS: no deformity or atrophy  Skin: warm and dry, no rash Neuro:  Alert and Oriented x 3, Strength and sensation are intact Psych: euthymic mood, full affect  Wt Readings from Last 3 Encounters:  10/16/16 141 lb 3.2 oz (64 kg)  10/16/16 141 lb 3.2 oz (64 kg)  09/21/16 140 lb (63.5  kg)      Studies/Labs Reviewed:   EKG:  EKG  Not repeated today.  Recent Labs: 07/05/2016: ALT 17 09/21/2016: BUN 11; Creatinine, Ser 0.72; Hemoglobin 13.4; Platelets 248; Potassium 4.0; Sodium 138   Lipid Panel    Component Value Date/Time   CHOL 166 07/05/2016 0313   TRIG 88 07/05/2016 0313   HDL 54 07/05/2016 0313   CHOLHDL 3.1 07/05/2016 0313   VLDL 18 07/05/2016 0313   LDLCALC 94 07/05/2016 0313  Additional studies/ records that were reviewed today include:  ECHOCARDIOGRAM, 09/2016 Study Conclusions  - Left ventricle: Systolic function was mildly reduced. The   estimated ejection fraction was in the range of 45% to 50%.   Doppler parameters are consistent with abnormal left ventricular   relaxation (grade 1 diastolic dysfunction). - Mitral valve: Moderately calcified annulus. Mildly thickened   leaflets . There was mild regurgitation. - Right ventricle: Systolic function was moderately reduced. - Line: A venous catheter was visualized in the superior vena cava,   with its tip in the right atrium. No abnormal features noted. - Pericardium, extracardiac: A trivial pericardial effusion was   identified.   ASSESSMENT:    1. Complete heart block (HCC)   2. Cardiomyopathy, ischemic   3. NSTEMI (non-ST elevated myocardial infarction) ([place])   4. CAD- S/P LAD thrombectomy 07/04/16   5. Essential hypertension   6. Pacemaker-MDT BiV placed 07/05/16   7. Ventricular tachycardia ([place])      PLAN:  In order of problems listed above:  1. Treated with DDD pacemaker. Currently stable. 2. LV function is low normal in the 40-45% range on low-dose heart feed therapy including 25 mg of Cozaar and 3.125 mg twice a day of carvedilol. No change in therapy at this time. 3. Benign STEMI was a complication of cardiac catheterization due to catheter-based embolus to the LAD which was successfully aspirated. No recurrent ischemic symptoms. Brilinta can be discontinued. 4. As noted  above. 5. Very well controlled. 6. We'll be seen in pacemaker clinic today.    Medication Adjustments/Labs and Tests Ordered: Current medicines are reviewed at length with the patient today.  Concerns regarding medicines are outlined above.  Medication changes, Labs and Tests ordered today are listed in the Patient Instructions below. Patient Instructions  Medication Instructions:  1) DISCONTINUE Brilinta  Labwork: None  Testing/Procedures: None  Follow-Up: Your physician wants you to follow-up in: 6-9 months with Dr. Tamala Julian.  You will receive a reminder letter in the mail two months in advance. If you don't receive a letter, please call our office to schedule the follow-up appointment.   Any Other Special Instructions Will Be Listed Below (If Applicable).     If you need a refill on your cardiac medications before your next appointment, please call your pharmacy.      Signed, Brandy Grooms, MD  10/16/2016 10:36 AM    [facility] [place], [place], [place]  [address] Phone: [phone]; Fax: [phone]

## 2016-10-16 NOTE — Progress Notes (Signed)
HPI [name] returns today for followup. She is a pleasant 70 yo woman with CHB, and LV dysfunction who underwent insertion of a BiV PPM several months ago. She has been stable in the interim. She denies chest pain or sob. She does note that she likes to sleep during the day.  Allergies  Allergen Reactions  . Betadine [Povidone Iodine] Shortness Of Breath  . Hydrocodone Itching     Current Outpatient Prescriptions  Medication Sig Dispense Refill  . acetaminophen (TYLENOL) 325 MG tablet Take 2 tablets (650 mg total) by mouth every 4 (four) hours as needed for headache or mild pain.    Marland Kitchen albuterol (PROVENTIL HFA;VENTOLIN HFA) 108 (90 Base) MCG/ACT inhaler Inhale into the lungs every 6 (six) hours as needed for wheezing or shortness of breath.    Marland Kitchen aspirin 81 MG tablet Take 81 mg by mouth daily.      Marland Kitchen atorvastatin (LIPITOR) 40 MG tablet Take 1 tablet (40 mg total) by mouth daily at 6 PM. 90 tablet 3  . budesonide-formoterol (SYMBICORT) 160-4.5 MCG/ACT inhaler Inhale 2 puffs into the lungs 2 (two) times daily.    . calcium carbonate (OS-CAL) 600 MG TABS Take 600 mg by mouth 2 (two) times daily with a meal.      . carvedilol (COREG) 3.125 MG tablet Take 1 tablet (3.125 mg total) by mouth 2 (two) times daily. 180 tablet 3  . cholecalciferol (VITAMIN D) 1000 UNITS tablet Take 1,000 Units by mouth daily.      . citalopram (CELEXA) 40 MG tablet Take 40 mg by mouth daily.      . clonazePAM (KLONOPIN) 0.5 MG tablet Take 0.5 mg by mouth every 12 (twelve) hours as needed for anxiety.  0  . folic acid (FOLVITE) 0.5 MG tablet Take 0.5 mg by mouth daily.    . hydroxychloroquine (PLAQUENIL) 200 MG tablet Take 200 mg by mouth See admin instructions. 200mg  twice daily Monday through Friday    . losartan (COZAAR) 25 MG tablet Take 1 tablet (25 mg total) by mouth daily. 90 tablet 3  . methotrexate (RHEUMATREX) 2.5 MG tablet TAKE 6 TABLETS BY MOUTH 1 TIME WEEKLY 72 tablet 0  . nitroGLYCERIN  (NITROSTAT) 0.4 MG SL tablet Place 1 tablet (0.4 mg total) under the tongue every 5 (five) minutes as needed for chest pain. 25 tablet 2  . pantoprazole (PROTONIX) 40 MG tablet Take 1 tablet (40 mg total) by mouth daily. 30 tablet 5   No current facility-administered medications for this visit.      Past Medical History:  Diagnosis Date  . Anxiety   . Asthma   . Breast cancer ([place])    left breast  . Depression   . Habitual alcohol use   . Hypertension   . RA (rheumatoid arthritis) ([place])   . Spinal stenosis     ROS:   All systems reviewed and negative except as noted in the HPI.   Past Surgical History:  Procedure Laterality Date  . BREAST LUMPECTOMY  2005   left  . CARDIAC CATHETERIZATION N/A 07/04/2016   Procedure: Left Heart Cath and Coronary Angiography;  Surgeon: Belva Crome, MD;  Location: [facility];  Service: Cardiovascular;  Laterality: N/A;  . CARDIAC CATHETERIZATION N/A 07/04/2016   Procedure: Temporary Pacemaker;  Surgeon: Belva Crome, MD;  Location: [facility];  Service: Cardiovascular;  Laterality: N/A;  . EP IMPLANTABLE DEVICE N/A 07/05/2016   Procedure: Pacemaker  Implant;  Surgeon: Evans Lance, MD;  Location: [facility];  Service: Cardiovascular;  Laterality: N/A;  . [place]  . LYMPHECTOMY  2005     Family History  Problem Relation Age of Onset  . Stroke Mother   . Cancer Father     colon?  . Cancer Paternal Aunt   . Cancer Paternal Uncle      Social History   Social History  . Marital status: Married    Spouse name: N/A  . Number of children: N/A  . Years of education: N/A   Occupational History  . Not on file.   Social History Main Topics  . Smoking status: Former Research scientist (life sciences)  . Smokeless tobacco: Never Used     Comment: quit 1995  . Alcohol use 12.6 oz/week    21 Cans of beer per week     Comment: 3 DAILY  . Drug use: No  . Sexual activity: Not on file   Other Topics Concern  . Not on file    Social History Narrative  . No narrative on file     BP 120/60   Pulse 77   Ht 5\' 2"  (1.575 m)   Wt 141 lb 3.2 oz (64 kg)   BMI 25.83 kg/m   Physical Exam:  Well appearing 70 yo woman, NAD HEENT: Unremarkable Neck:  No JVD, no thyromegally Lymphatics:  No adenopathy Back:  No CVA tenderness Lungs:  Clear with no wheezes HEART:  Regular rate rhythm, no murmurs, no rubs, no clicks Abd:  soft, positive bowel sounds, no organomegally, no rebound, no guarding Ext:  2 plus pulses, no edema, no cyanosis, no clubbing Skin:  No rashes no nodules Neuro:  CN II through XII intact, motor grossly intact  DEVICE  Normal device function.  See PaceArt for details.   Assess/Plan: 1. Chronic systolic heart failure - she is class 2. She will continue her current meds. 2. Complete heart block - she is asymptomatic after her Biv PPM 3. BiV PPM - her medtronic device is working normally and she is biv pacing 99%. Will recheck in several months.  Mikle Bosworth.D.

## 2016-10-16 NOTE — Patient Instructions (Signed)
Medication Instructions:  1) DISCONTINUE Brilinta  Labwork: None  Testing/Procedures: None  Follow-Up: Your physician wants you to follow-up in: 6-9 months with Dr. Tamala Julian.  You will receive a reminder letter in the mail two months in advance. If you don't receive a letter, please call our office to schedule the follow-up appointment.   Any Other Special Instructions Will Be Listed Below (If Applicable).     If you need a refill on your cardiac medications before your next appointment, please call your pharmacy.

## 2016-10-16 NOTE — Patient Instructions (Signed)
Medication Instructions:  Your physician recommends that you continue on your current medications as directed. Please refer to the Current Medication list given to you today.   Labwork: None Ordered   Testing/Procedures: None Ordered   Follow-Up: Your physician wants you to follow-up in: 9 months with Dr. Lovena Le. You will receive a reminder letter in the mail two months in advance. If you don't receive a letter, please call our office to schedule the follow-up appointment.  Remote monitoring is used to monitor your Pacemaker from home. This monitoring reduces the number of office visits required to check your device to one time per year. It allows [place] to keep an eye on the functioning of your device to ensure it is working properly. You are scheduled for a device check from home on 01/15/17. You may send your transmission at any time that day. If you have a wireless device, the transmission will be sent automatically. After your physician reviews your transmission, you will receive a postcard with your next transmission date.    Any Other Special Instructions Will Be Listed Below (If Applicable).     If you need a refill on your cardiac medications before your next appointment, please call your pharmacy.

## 2016-10-18 ENCOUNTER — Encounter (HOSPITAL_COMMUNITY)
Admission: RE | Admit: 2016-10-18 | Discharge: 2016-10-18 | Disposition: A | Discharge status: Home / Self Care | Payer: Medicare Other | Source: Ambulatory Visit | Attending: Interventional Cardiology | Admitting: Interventional Cardiology

## 2016-10-18 DIAGNOSIS — I110 Hypertensive heart disease with heart failure: Secondary | ICD-10-CM | POA: Insufficient documentation

## 2016-10-18 DIAGNOSIS — I214 Non-ST elevation (NSTEMI) myocardial infarction: Secondary | ICD-10-CM

## 2016-10-18 DIAGNOSIS — Z7951 Long term (current) use of inhaled steroids: Secondary | ICD-10-CM | POA: Diagnosis not present

## 2016-10-18 DIAGNOSIS — F329 Major depressive disorder, single episode, unspecified: Secondary | ICD-10-CM | POA: Insufficient documentation

## 2016-10-18 DIAGNOSIS — Z9861 Coronary angioplasty status: Secondary | ICD-10-CM | POA: Insufficient documentation

## 2016-10-18 DIAGNOSIS — I252 Old myocardial infarction: Secondary | ICD-10-CM | POA: Diagnosis not present

## 2016-10-18 DIAGNOSIS — I509 Heart failure, unspecified: Secondary | ICD-10-CM | POA: Diagnosis not present

## 2016-10-18 DIAGNOSIS — Z87891 Personal history of nicotine dependence: Secondary | ICD-10-CM | POA: Insufficient documentation

## 2016-10-18 DIAGNOSIS — Z7902 Long term (current) use of antithrombotics/antiplatelets: Secondary | ICD-10-CM | POA: Diagnosis not present

## 2016-10-18 DIAGNOSIS — Z853 Personal history of malignant neoplasm of breast: Secondary | ICD-10-CM | POA: Insufficient documentation

## 2016-10-18 DIAGNOSIS — M069 Rheumatoid arthritis, unspecified: Secondary | ICD-10-CM | POA: Diagnosis not present

## 2016-10-18 DIAGNOSIS — J45909 Unspecified asthma, uncomplicated: Secondary | ICD-10-CM | POA: Insufficient documentation

## 2016-10-18 DIAGNOSIS — Z79899 Other long term (current) drug therapy: Secondary | ICD-10-CM | POA: Diagnosis not present

## 2016-10-21 ENCOUNTER — Encounter (HOSPITAL_COMMUNITY)
Admission: RE | Admit: 2016-10-21 | Discharge: 2016-10-21 | Disposition: A | Discharge status: Home / Self Care | Payer: Medicare Other | Source: Ambulatory Visit | Attending: Interventional Cardiology | Admitting: Interventional Cardiology

## 2016-10-21 DIAGNOSIS — I252 Old myocardial infarction: Secondary | ICD-10-CM | POA: Diagnosis not present

## 2016-10-21 DIAGNOSIS — I214 Non-ST elevation (NSTEMI) myocardial infarction: Secondary | ICD-10-CM

## 2016-10-21 NOTE — Progress Notes (Signed)
[name] 70 y.o. female Nutrition Note Spoke with pt. Nutrition Survey reviewed with pt. Pt is following Step 1 of the Therapeutic Lifestyle Changes diet. Pt c/o slow, unintended wt loss. Pt wt today 139.7 lb (63.5 kg). Per discussion, pt often does not eat as much dinner as she normally would because she's too tired after cooking dinner. Energy management with meal prep discussed. Pt expressed understanding of the information reviewed. Pt aware of nutrition education classes offered and is unable to attend nutrition classes . No results found for: HGBA1C Wt Readings from Last 3 Encounters:  10/16/16 141 lb 3.2 oz (64 kg)  10/16/16 141 lb 3.2 oz (64 kg)  09/21/16 140 lb (63.5 kg)   Nutrition Diagnosis ? Food-and nutrition-related knowledge deficit related to lack of exposure to information as related to diagnosis of: ? CVD   Nutrition Intervention ? Benefits of adopting Therapeutic Lifestyle Changes discussed when Medficts reviewed. ? Pt to attend the Portion Distortion class ? Pt given handouts for: ? Nutrition I class ? Nutrition II class ? Continue client-centered nutrition education by RD, as part of interdisciplinary care.  Goal(s) ? Pt to identify food quantities necessary to achieve weight maintenance at graduation from cardiac rehab.   Monitor and Evaluate progress toward nutrition goal with team.  Derek Mound, M.Ed, RD, LDN, CDE 10/21/2016 2:57 PM

## 2016-10-23 ENCOUNTER — Encounter (HOSPITAL_COMMUNITY)
Admission: RE | Admit: 2016-10-23 | Discharge: 2016-10-23 | Disposition: A | Discharge status: Home / Self Care | Payer: Medicare Other | Source: Ambulatory Visit | Attending: Interventional Cardiology | Admitting: Interventional Cardiology

## 2016-10-23 DIAGNOSIS — I252 Old myocardial infarction: Secondary | ICD-10-CM | POA: Diagnosis not present

## 2016-10-23 DIAGNOSIS — I214 Non-ST elevation (NSTEMI) myocardial infarction: Secondary | ICD-10-CM

## 2016-10-24 NOTE — Progress Notes (Signed)
Cardiac Individual Treatment Plan  Patient Details  Name: [name] MRN: [id number] Date of Birth: 07-15-1946 Referring Provider:   Flowsheet Row CARDIAC REHAB PHASE II ORIENTATION from 08/20/2016 in [place]  Referring Provider  Daneen Schick MD      Initial Encounter Date:  [place] from 08/20/2016 in [place]  Date  08/20/16  Referring Provider  Daneen Schick MD      Visit Diagnosis: 07/04/16 NSTEMI (non-ST elevated myocardial infarction) [facility])  Patient's Home Medications on Admission:  Current Outpatient Prescriptions:  .  acetaminophen (TYLENOL) 325 MG tablet, Take 2 tablets (650 mg total) by mouth every 4 (four) hours as needed for headache or mild pain., Disp: , Rfl:  .  albuterol (PROVENTIL HFA;VENTOLIN HFA) 108 (90 Base) MCG/ACT inhaler, Inhale into the lungs every 6 (six) hours as needed for wheezing or shortness of breath., Disp: , Rfl:  .  aspirin 81 MG tablet, Take 81 mg by mouth daily.  , Disp: , Rfl:  .  atorvastatin (LIPITOR) 40 MG tablet, Take 1 tablet (40 mg total) by mouth daily at 6 PM., Disp: 90 tablet, Rfl: 3 .  budesonide-formoterol (SYMBICORT) 160-4.5 MCG/ACT inhaler, Inhale 2 puffs into the lungs 2 (two) times daily., Disp: , Rfl:  .  calcium carbonate (OS-CAL) 600 MG TABS, Take 600 mg by mouth 2 (two) times daily with a meal.  , Disp: , Rfl:  .  carvedilol (COREG) 3.125 MG tablet, Take 1 tablet (3.125 mg total) by mouth 2 (two) times daily., Disp: 180 tablet, Rfl: 3 .  cholecalciferol (VITAMIN D) 1000 UNITS tablet, Take 1,000 Units by mouth daily.  , Disp: , Rfl:  .  citalopram (CELEXA) 40 MG tablet, Take 40 mg by mouth daily.  , Disp: , Rfl:  .  clonazePAM (KLONOPIN) 0.5 MG tablet, Take 0.5 mg by mouth every 12 (twelve) hours as needed for anxiety., Disp: , Rfl: 0 .  folic acid (FOLVITE) 0.5 MG tablet, Take 0.5 mg by mouth daily., Disp: , Rfl:  .   hydroxychloroquine (PLAQUENIL) 200 MG tablet, Take 200 mg by mouth See admin instructions. 200mg  twice daily Monday through Friday, Disp: , Rfl:  .  losartan (COZAAR) 25 MG tablet, Take 1 tablet (25 mg total) by mouth daily., Disp: 90 tablet, Rfl: 3 .  methotrexate (RHEUMATREX) 2.5 MG tablet, TAKE 6 TABLETS BY MOUTH 1 TIME WEEKLY, Disp: 72 tablet, Rfl: 0 .  nitroGLYCERIN (NITROSTAT) 0.4 MG SL tablet, Place 1 tablet (0.4 mg total) under the tongue every 5 (five) minutes as needed for chest pain., Disp: 25 tablet, Rfl: 2 .  pantoprazole (PROTONIX) 40 MG tablet, Take 1 tablet (40 mg total) by mouth daily., Disp: 30 tablet, Rfl: 5  Past Medical History: Past Medical History:  Diagnosis Date  . Anxiety   . Asthma   . Breast cancer ([place])    left breast  . Depression   . Habitual alcohol use   . Hypertension   . RA (rheumatoid arthritis) ([place])   . Spinal stenosis     Tobacco Use: History  Smoking Status  . Former Smoker  Smokeless Tobacco  . Never Used    Comment: quit 1995    Labs: Recent Review Flowsheet Data    Labs for ITP Cardiac and Pulmonary Rehab Latest Ref Rng & Units 07/05/2016   Cholestrol 0 - 200 mg/dL 166   LDLCALC 0 - 99 mg/dL 94  HDL >40 mg/dL 54   Trlycerides <150 mg/dL 88      Capillary Blood Glucose: Lab Results  Component Value Date   GLUCAP 118 (H) 07/06/2016     Exercise Target Goals:    Exercise Program Goal: Individual exercise prescription set with THRR, safety & activity barriers. Participant demonstrates ability to understand and report RPE using BORG scale, to self-measure pulse accurately, and to acknowledge the importance of the exercise prescription.  Exercise Prescription Goal: Starting with aerobic activity 30 plus minutes a day, 3 days per week for initial exercise prescription. Provide home exercise prescription and guidelines that participant acknowledges understanding prior to discharge.  Activity Barriers & Risk Stratification:      Activity Barriers & Cardiac Risk Stratification - 08/20/16 1454      Activity Barriers & Cardiac Risk Stratification   Activity Barriers Arthritis;Back Problems   Cardiac Risk Stratification High      6 Minute Walk:     6 Minute Walk    Row Name 08/20/16 1657         6 Minute Walk   Phase Initial     Distance 1163 feet     Walk Time 6 minutes     # of Rest Breaks 0     MPH 2.2     METS 2.42     RPE 11     VO2 Peak 8.46     Symptoms Yes (comment)     Comments Participant c/o bilateral thigh soreness during walk.     Resting HR 67 bpm     Resting BP 129/60     Max Ex. HR 89 bpm     Max Ex. BP 122/68     2 Minute Post BP 122/68        Initial Exercise Prescription:     Initial Exercise Prescription - 08/21/16 0800      Date of Initial Exercise RX and Referring Provider   Date 08/20/16   Referring Provider Daneen Schick MD     Bike   Level 0.5   Minutes 10   METs 2.45     NuStep   Level 1   Minutes 10   METs 2.4     Track   Laps 8   Minutes 10   METs 2.39     Prescription Details   Frequency (times per week) 2   Duration Progress to 30 minutes of continuous aerobic without signs/symptoms of physical distress     Intensity   THRR 40-80% of Max Heartrate 59-118   Ratings of Perceived Exertion 11-13   Perceived Dyspnea 0-4     Progression   Progression Continue to progress workloads to maintain intensity without signs/symptoms of physical distress.     Resistance Training   Training Prescription Yes   Weight 1lb   Reps 10-12      Perform Capillary Blood Glucose checks as needed.  Exercise Prescription Changes:     Exercise Prescription Changes    Row Name 08/28/16 1600 09/13/16 1400 09/24/16 1100 10/22/16 1100       Exercise Review   Progression  - Yes -  -      Response to Exercise   Blood Pressure (Admit) 120/62 116/60 120/62 118/54    Blood Pressure (Exercise) 114/60 110/60 128/60 122/60    Blood Pressure (Exit) 110/60 116/58  112/64 124/64    Heart Rate (Admit) 88 bpm 78 bpm 87 bpm 90 bpm    Heart Rate (Exercise) 98 bpm  103 bpm 105 bpm 98 bpm    Heart Rate (Exit) 86 bpm 78 bpm 92 bpm 88 bpm    Rating of Perceived Exertion (Exercise) 13 12 12 13     Symptoms  - none none none    Comments  - Reviewed home exercise guidelines on  09/02/16. Reviewed home exercise guidelines on  09/02/16. Reviewed home exercise guidelines on  09/02/16.    Duration  - Progress to 30 minutes of continuous aerobic without signs/symptoms of physical distress Progress to 30 minutes of continuous aerobic without signs/symptoms of physical distress Progress to 30 minutes of continuous aerobic without signs/symptoms of physical distress    Intensity  - THRR unchanged THRR unchanged THRR unchanged      Progression   Progression  - Continue to progress workloads to maintain intensity without signs/symptoms of physical distress. Continue to progress workloads to maintain intensity without signs/symptoms of physical distress. Continue to progress workloads to maintain intensity without signs/symptoms of physical distress.    Average METs 2.25 2.8 2.7 3      Resistance Training   Training Prescription Yes Yes Yes Yes    Weight 1lb 2lbs 2lbs 2lbs    Reps 10-12 -  -  -      Interval Training   Interval Training  - No No No      Bike   Level 0.5 0.5 0.5 -    Minutes 10 10 10  -    METs 2.45 2.47 2.47 -      Recumbant Bike   Level  -  -  - 2    Watts  -  -  - 35    Minutes  -  -  - 10    METs  -  -  - 2.97      NuStep   Level 1 3 3 4     Minutes 1.9 10 10 10     METs 2.4 3.4 3.3 3      Track   Laps 11 10 11 10     Minutes 10 10 10 10     METs 2.39 2.74 2.92 2.74      Home Exercise Plan   Plans to continue exercise at  [facility]    Frequency  - Add 2 additional days to program exercise sessions. Add 2 additional days to program exercise sessions. Add 2 additional days to program exercise sessions.       Exercise Comments:      Exercise Comments    Row Name 08/28/16 1703 09/02/16 1400 09/13/16 1435 10/22/16 1149     Exercise Comments Pt is doing well with exercise prescription Reviewed home exercies guidelines with participant. She plans to walk starting at 20 minutes 1-2 days per week and gradually increase to 30 minutes 1-2 days per week. Participant is walking 25 minutes, 2 days per week at home. Reviewed METs and goals. Pt is tolerating exercise well; will continue to monitor exercise progression.       Discharge Exercise Prescription (Final Exercise Prescription Changes):     Exercise Prescription Changes - 10/22/16 1100      Response to Exercise   Blood Pressure (Admit) 118/54   Blood Pressure (Exercise) 122/60   Blood Pressure (Exit) 124/64   Heart Rate (Admit) 90 bpm   Heart Rate (Exercise) 98 bpm   Heart Rate (Exit) 88 bpm   Rating of Perceived Exertion (Exercise) 13   Symptoms none   Comments Reviewed home exercise guidelines on  09/02/16.   Duration Progress to 30 minutes of continuous aerobic without signs/symptoms of physical distress   Intensity THRR unchanged     Progression   Progression Continue to progress workloads to maintain intensity without signs/symptoms of physical distress.   Average METs 3     Resistance Training   Training Prescription Yes   Weight 2lbs     Interval Training   Interval Training No     Bike   Level --   Minutes --   METs --     Recumbant Bike   Level 2   Watts 35   Minutes 10   METs 2.97     NuStep   Level 4   Minutes 10   METs 3     Track   Laps 10   Minutes 10   METs 2.74     Home Exercise Plan   Plans to continue exercise at Home   Frequency Add 2 additional days to program exercise sessions.      Nutrition:  Target Goals: Understanding of nutrition guidelines, daily intake of sodium 1500mg , cholesterol 200mg , calories 30% from fat and 7% or less from saturated fats, daily to have 5 or more servings of fruits and  vegetables.  Biometrics:     Pre Biometrics - 08/20/16 1659      Pre Biometrics   Height 5\' 3"  (1.6 m)   Weight 143 lb 8.3 oz (65.1 kg)   Waist Circumference 33.75 inches   Hip Circumference 40.75 inches   Waist to Hip Ratio 0.83 %   BMI (Calculated) 25.5   Triceps Skinfold 18 mm   % Body Fat 35.5 %   Grip Strength 24 kg   Flexibility 11 in   Single Leg Stand 3.93 seconds       Nutrition Therapy Plan and Nutrition Goals:     Nutrition Therapy & Goals - 08/23/16 1150      Nutrition Therapy   Diet Therapeutic Lifestyle Changes     Intervention Plan   Intervention Prescribe, educate and counsel regarding individualized specific dietary modifications aiming towards targeted core components such as weight, hypertension, lipid management, diabetes, heart failure and other comorbidities.   Expected Outcomes Short Term Goal: Understand basic principles of dietary content, such as calories, fat, sodium, cholesterol and nutrients.;Long Term Goal: Adherence to prescribed nutrition plan.      Nutrition Discharge: Nutrition Scores:     Nutrition Assessments - 08/23/16 1150      MEDFICTS Scores   Pre Score 42      Nutrition Goals Re-Evaluation:   Psychosocial: Target Goals: Acknowledge presence or absence of depression, maximize coping skills, provide positive support system. Participant is able to verbalize types and ability to use techniques and skills needed for reducing stress and depression.  Initial Review & Psychosocial Screening:   Quality of Life Scores:     Quality of Life - 08/20/16 1659      Quality of Life Scores   Health/Function Pre 19.18 %   Socioeconomic Pre 27 %   Psych/Spiritual Pre 21.63 %   Family Pre 21.75 %   GLOBAL Pre 21.65 %      PHQ-9: Recent Review Flowsheet Data    Depression screen PHQ 2/9 08/26/2016   Decreased Interest 0   Down, Depressed, Hopeless 0   PHQ - 2 Score 0      Psychosocial Evaluation and Intervention:      Psychosocial Evaluation - 10/24/16 1608      Psychosocial  Evaluation & Interventions   Interventions Encouraged to exercise with the program and follow exercise prescription   Comments Pt with no psychosocial needs identified. No interventions needed.   Continued Psychosocial Services Needed No     Discharge Psychosocial Assessment & Intervention   Discharge Continue support measures as needed      Psychosocial Re-Evaluation:     Psychosocial Re-Evaluation    Row Name 09/26/16 1516 10/24/16 1608           Psychosocial Re-Evaluation   Interventions Encouraged to attend Cardiac Rehabilitation for the exercise Encouraged to attend Cardiac Rehabilitation for the exercise         Vocational Rehabilitation: Provide vocational rehab assistance to qualifying candidates.   Vocational Rehab Evaluation & Intervention:   Education: Education Goals: Education classes will be provided on a weekly basis, covering required topics. Participant will state understanding/return demonstration of topics presented.  Learning Barriers/Preferences:     Learning Barriers/Preferences - 08/21/16 0809      Learning Barriers/Preferences   Learning Barriers None   Learning Preferences None      Education Topics: Count Your Pulse:  -Group instruction provided by verbal instruction, demonstration, patient participation and written materials to support subject.  Instructors address importance of being able to find your pulse and how to count your pulse when at home without a heart monitor.  Patients get hands on experience counting their pulse with staff help and individually. Flowsheet Row CARDIAC REHAB PHASE II EXERCISE from 10/23/2016 in [place]  Date  09/20/16  Educator  Andi Hence, RN  Instruction Review Code  2- meets goals/outcomes      Heart Attack, Angina, and Risk Factor Modification:  -Group instruction provided by verbal instruction, video, and  written materials to support subject.  Instructors address signs and symptoms of angina and heart attacks.    Also discuss risk factors for heart disease and how to make changes to improve heart health risk factors. Flowsheet Row CARDIAC REHAB PHASE II EXERCISE from 10/23/2016 in [place]  Date  10/18/16 [hypertension]  Educator  Arville Go RIon RN  Instruction Review Code  2- meets goals/outcomes      Functional Fitness:  -Group instruction provided by verbal instruction, demonstration, patient participation, and written materials to support subject.  Instructors address safety measures for doing things around the house.  Discuss how to get up and down off the floor, how to pick things up properly, how to safely get out of a chair without assistance, and balance training. Flowsheet Row CARDIAC REHAB PHASE II EXERCISE from 10/23/2016 in [place]  Date  09/27/16  Instruction Review Code  2- meets goals/outcomes      Meditation and Mindfulness:  -Group instruction provided by verbal instruction, patient participation, and written materials to support subject.  Instructor addresses importance of mindfulness and meditation practice to help reduce stress and improve awareness.  Instructor also leads participants through a meditation exercise.    Stretching for Flexibility and Mobility:  -Group instruction provided by verbal instruction, patient participation, and written materials to support subject.  Instructors lead participants through series of stretches that are designed to increase flexibility thus improving mobility.  These stretches are additional exercise for major muscle groups that are typically performed during regular warm up and cool down. Flowsheet Row CARDIAC REHAB PHASE II EXERCISE from 10/23/2016 in [place]  Date  10/23/16  Educator  Primus Bravo  Celesta Aver  Instruction Review Code  2- meets  goals/outcomes      Hands Only CPR Anytime:  -Group instruction provided by verbal instruction, video, patient participation and written materials to support subject.  Instructors co-teach with AHA video for hands only CPR.  Participants get hands on experience with mannequins.   Nutrition I class: Heart Healthy Eating:  -Group instruction provided by PowerPoint slides, verbal discussion, and written materials to support subject matter. The instructor gives an explanation and review of the Therapeutic Lifestyle Changes diet recommendations, which includes a discussion on lipid goals, dietary fat, sodium, fiber, plant stanol/sterol esters, sugar, and the components of a well-balanced, healthy diet. Flowsheet Row CARDIAC REHAB PHASE II EXERCISE from 10/23/2016 in [place]  Date  10/21/16  Educator  RD  Instruction Review Code  Not applicable [class handouts given]      Nutrition II class: Lifestyle Skills:  -Group instruction provided by PowerPoint slides, verbal discussion, and written materials to support subject matter. The instructor gives an explanation and review of label reading, grocery shopping for heart health, heart healthy recipe modifications, and ways to make healthier choices when eating out. Flowsheet Row CARDIAC REHAB PHASE II EXERCISE from 10/23/2016 in [place]  Date  10/21/16  Educator  RD  Instruction Review Code  Not applicable [class handouts given]      Diabetes Question & Answer:  -Group instruction provided by PowerPoint slides, verbal discussion, and written materials to support subject matter. The instructor gives an explanation and review of diabetes co-morbidities, pre- and post-prandial blood glucose goals, pre-exercise blood glucose goals, signs, symptoms, and treatment of hypoglycemia and hyperglycemia, and foot care basics. Flowsheet Row CARDIAC REHAB PHASE II EXERCISE from 10/23/2016 in [place]  Date  08/30/16  Educator  RD  Instruction Review Code  2- meets goals/outcomes      Diabetes Blitz:  -Group instruction provided by PowerPoint slides, verbal discussion, and written materials to support subject matter. The instructor gives an explanation and review of the physiology behind type 1 and type 2 diabetes, diabetes medications and rational behind using different medications, pre- and post-prandial blood glucose recommendations and Hemoglobin A1c goals, diabetes diet, and exercise including blood glucose guidelines for exercising safely.    Portion Distortion:  -Group instruction provided by PowerPoint slides, verbal discussion, written materials, and food models to support subject matter. The instructor gives an explanation of serving size versus portion size, changes in portions sizes over the last 20 years, and what consists of a serving from each food group.   Stress Management:  -Group instruction provided by verbal instruction, video, and written materials to support subject matter.  Instructors review role of stress in heart disease and how to cope with stress positively.   Flowsheet Row CARDIAC REHAB PHASE II EXERCISE from 10/23/2016 in [place]  Date  10/02/16  Instruction Review Code  2- meets goals/outcomes      Exercising on Your Own:  -Group instruction provided by verbal instruction, power point, and written materials to support subject.  Instructors discuss benefits of exercise, components of exercise, frequency and intensity of exercise, and end points for exercise.  Also discuss use of nitroglycerin and activating EMS.  Review options of places to exercise outside of rehab.  Review guidelines for sex with heart disease. Flowsheet Row CARDIAC REHAB PHASE II EXERCISE from 10/23/2016 in [place]  Date  10/04/16  Educator  Seward Carol  Instruction Review Code   2- meets goals/outcomes      Cardiac Drugs I:  -Group instruction provided by verbal instruction and written materials to support subject.  Instructor reviews cardiac drug classes: antiplatelets, anticoagulants, beta blockers, and statins.  Instructor discusses reasons, side effects, and lifestyle considerations for each drug class.   Cardiac Drugs II:  -Group instruction provided by verbal instruction and written materials to support subject.  Instructor reviews cardiac drug classes: angiotensin converting enzyme inhibitors (ACE-I), angiotensin II receptor blockers (ARBs), nitrates, and calcium channel blockers.  Instructor discusses reasons, side effects, and lifestyle considerations for each drug class.   Anatomy and Physiology of the Circulatory System:  -Group instruction provided by verbal instruction, video, and written materials to support subject.  Reviews functional anatomy of heart, how it relates to various diagnoses, and what role the heart plays in the overall system.   Knowledge Questionnaire Score:     Knowledge Questionnaire Score - 08/20/16 1658      Knowledge Questionnaire Score   Pre Score 18/24      Core Components/Risk Factors/Patient Goals at Admission:     Personal Goals and Risk Factors at Admission - 08/20/16 1654      Core Components/Risk Factors/Patient Goals on Admission   Hypertension Yes   Intervention Provide education on lifestyle modifcations including regular physical activity/exercise, weight management, moderate sodium restriction and increased consumption of fresh fruit, vegetables, and low fat dairy, alcohol moderation, and smoking cessation.;Monitor prescription use compliance.   Expected Outcomes Short Term: Continued assessment and intervention until BP is < 140/60mm HG in hypertensive participants. < 130/13mm HG in hypertensive participants with diabetes, heart failure or chronic kidney disease.;Long Term: Maintenance of blood pressure at  goal levels.   Stress Yes   Intervention Offer individual and/or small group education and counseling on adjustment to heart disease, stress management and health-related lifestyle change. Teach and support self-help strategies.   Expected Outcomes Short Term: Participant demonstrates changes in health-related behavior, relaxation and other stress management skills, ability to obtain effective social support, and compliance with psychotropic medications if prescribed.;Long Term: Emotional wellbeing is indicated by absence of clinically significant psychosocial distress or social isolation.   Personal Goal Other Yes   Personal Goal Increase energy.   Intervention Provide advice, counseling, support, and education about physical activity/exercise needs.   Expected Outcomes Achieve increased cardiorespiratory fitness shown through measurement of functional fitness and personal statement.      Core Components/Risk Factors/Patient Goals Review:      Goals and Risk Factor Review    Row Name 08/28/16 1655 09/13/16 1435 10/22/16 0815 10/22/16 0817       Core Components/Risk Factors/Patient Goals Review   Personal Goals Review Other Other Other;Increase Strength and Stamina  -    Review Pt plans to walk at least 3 days/week at home Energy level hasn't increased yet, but participant has begun home exercise 2 days per week in addition to 2 days per week at cardiac rehab. Pt is "starting to feel a difference" and feel as though energy levels are improving. Pt stated being 70% of where she used to be  -    Expected Outcomes To increase energy and fitness level Increase workloads to help increase energy and fitness level. In cardiac rehab staff will continue to increase workloads to help increase energy and fitness level. In cardiac rehab staff will continue to increase workloads to help increase energy and fitness level  per pt's tolerance       Core Components/Risk Factors/Patient Goals at Discharge  (Final Review):      Goals and Risk Factor Review - 10/22/16 0817      Core Components/Risk Factors/Patient Goals Review   Expected Outcomes In cardiac rehab staff will continue to increase workloads to help increase energy and fitness level per pt's tolerance      ITP Comments:     ITP Comments    Row Name 08/20/16 1654           ITP Comments Medical Director- Dr. Fransico Him, MD          Comments:  Pt is making expected progress toward personal goals after completing 18 sessions.Repeat Psychosocial Assessment: Pt with supportive family, denies any Psychosocial needs or interventions at this time. Pt has returned to those activities she finds pleasurable. Recommend continued exercise and life style modification education including  stress management and relaxation techniques to decrease cardiac risk profile. Cherre Huger, BSN

## 2016-10-25 ENCOUNTER — Encounter (HOSPITAL_COMMUNITY): Payer: Medicare Other

## 2016-10-28 ENCOUNTER — Encounter (HOSPITAL_COMMUNITY)
Admission: RE | Admit: 2016-10-28 | Discharge: 2016-10-28 | Disposition: A | Discharge status: Home / Self Care | Payer: Medicare Other | Source: Ambulatory Visit | Attending: Interventional Cardiology | Admitting: Interventional Cardiology

## 2016-10-28 DIAGNOSIS — I214 Non-ST elevation (NSTEMI) myocardial infarction: Secondary | ICD-10-CM

## 2016-10-28 DIAGNOSIS — I252 Old myocardial infarction: Secondary | ICD-10-CM | POA: Diagnosis not present

## 2016-10-29 ENCOUNTER — Telehealth: Payer: Self-pay | Admitting: Interventional Cardiology

## 2016-10-29 NOTE — Telephone Encounter (Signed)
Spoke with Richardson Landry at [facility] and he states that he has to have CPT codes in order to assist with what needs to be done to assist pt.  Called pt and left message letting her know that I am sending message to billing dept to assist with this issue.

## 2016-10-29 NOTE — Telephone Encounter (Signed)
New Message  Pt voiced switching insurance plans and in cardio rehab plan.  Pt voiced she needs new authorization from [facility] stating needing cardio rehab effective Jan. 1., 2018  Pt voiced [phone] for prior authorization and pt voiced [facility] voiced it takes 14 days for it to be processed and for [place] to send ASAP.  Please f/u

## 2016-10-30 ENCOUNTER — Encounter (HOSPITAL_COMMUNITY)
Admission: RE | Admit: 2016-10-30 | Discharge: 2016-10-30 | Disposition: A | Discharge status: Home / Self Care | Payer: Medicare Other | Source: Ambulatory Visit | Attending: Interventional Cardiology | Admitting: Interventional Cardiology

## 2016-10-30 DIAGNOSIS — I252 Old myocardial infarction: Secondary | ICD-10-CM | POA: Diagnosis not present

## 2016-10-30 DIAGNOSIS — I214 Non-ST elevation (NSTEMI) myocardial infarction: Secondary | ICD-10-CM

## 2016-11-01 ENCOUNTER — Encounter (HOSPITAL_COMMUNITY): Admission: RE | Admit: 2016-11-01 | Payer: Medicare Other | Source: Ambulatory Visit

## 2016-11-01 NOTE — Telephone Encounter (Signed)
Spoke w/pt and gave CPT code [phone].  No precert is required but needs to meet medically necessity.  Her clinicals do support outpt cardiac rehab.  She will call her insurance to get her benefits.

## 2016-11-04 ENCOUNTER — Encounter (HOSPITAL_COMMUNITY)
Admission: RE | Admit: 2016-11-04 | Discharge: 2016-11-04 | Disposition: A | Discharge status: Home / Self Care | Payer: Medicare Other | Source: Ambulatory Visit | Attending: Interventional Cardiology | Admitting: Interventional Cardiology

## 2016-11-04 DIAGNOSIS — I252 Old myocardial infarction: Secondary | ICD-10-CM | POA: Diagnosis not present

## 2016-11-04 DIAGNOSIS — I214 Non-ST elevation (NSTEMI) myocardial infarction: Secondary | ICD-10-CM

## 2016-11-05 ENCOUNTER — Ambulatory Visit
Admission: RE | Admit: 2016-11-05 | Discharge: 2016-11-05 | Disposition: A | Discharge status: Home / Self Care | Payer: Medicare Other | Source: Ambulatory Visit | Attending: Family Medicine | Admitting: Family Medicine

## 2016-11-05 DIAGNOSIS — Z1231 Encounter for screening mammogram for malignant neoplasm of breast: Secondary | ICD-10-CM

## 2016-11-06 ENCOUNTER — Encounter (HOSPITAL_COMMUNITY): Payer: Medicare Other

## 2016-11-08 ENCOUNTER — Encounter (HOSPITAL_COMMUNITY)
Admission: RE | Admit: 2016-11-08 | Discharge: 2016-11-08 | Disposition: A | Discharge status: Home / Self Care | Payer: Medicare Other | Source: Ambulatory Visit | Attending: Interventional Cardiology | Admitting: Interventional Cardiology

## 2016-11-08 DIAGNOSIS — I214 Non-ST elevation (NSTEMI) myocardial infarction: Secondary | ICD-10-CM

## 2016-11-08 DIAGNOSIS — I252 Old myocardial infarction: Secondary | ICD-10-CM | POA: Diagnosis not present

## 2016-11-13 ENCOUNTER — Encounter (HOSPITAL_COMMUNITY)
Admission: RE | Admit: 2016-11-13 | Discharge: 2016-11-13 | Disposition: A | Discharge status: Home / Self Care | Payer: Medicare Other | Source: Ambulatory Visit | Attending: Interventional Cardiology | Admitting: Interventional Cardiology

## 2016-11-13 DIAGNOSIS — I252 Old myocardial infarction: Secondary | ICD-10-CM | POA: Diagnosis not present

## 2016-11-13 DIAGNOSIS — I214 Non-ST elevation (NSTEMI) myocardial infarction: Secondary | ICD-10-CM

## 2016-11-15 ENCOUNTER — Encounter (HOSPITAL_COMMUNITY)
Admission: RE | Admit: 2016-11-15 | Discharge: 2016-11-15 | Disposition: A | Discharge status: Home / Self Care | Payer: Medicare Other | Source: Ambulatory Visit | Attending: Interventional Cardiology | Admitting: Interventional Cardiology

## 2016-11-15 DIAGNOSIS — I214 Non-ST elevation (NSTEMI) myocardial infarction: Secondary | ICD-10-CM

## 2016-11-15 DIAGNOSIS — I252 Old myocardial infarction: Secondary | ICD-10-CM | POA: Diagnosis not present

## 2016-11-20 ENCOUNTER — Encounter (HOSPITAL_COMMUNITY)
Admission: RE | Admit: 2016-11-20 | Discharge: 2016-11-20 | Disposition: A | Discharge status: Home / Self Care | Payer: Medicare Other | Source: Ambulatory Visit | Attending: Interventional Cardiology | Admitting: Interventional Cardiology

## 2016-11-20 ENCOUNTER — Other Ambulatory Visit: Payer: Self-pay | Admitting: Rheumatology

## 2016-11-20 DIAGNOSIS — I509 Heart failure, unspecified: Secondary | ICD-10-CM | POA: Diagnosis not present

## 2016-11-20 DIAGNOSIS — Z9861 Coronary angioplasty status: Secondary | ICD-10-CM | POA: Diagnosis not present

## 2016-11-20 DIAGNOSIS — Z853 Personal history of malignant neoplasm of breast: Secondary | ICD-10-CM | POA: Diagnosis not present

## 2016-11-20 DIAGNOSIS — Z7902 Long term (current) use of antithrombotics/antiplatelets: Secondary | ICD-10-CM | POA: Insufficient documentation

## 2016-11-20 DIAGNOSIS — I214 Non-ST elevation (NSTEMI) myocardial infarction: Secondary | ICD-10-CM

## 2016-11-20 DIAGNOSIS — F329 Major depressive disorder, single episode, unspecified: Secondary | ICD-10-CM | POA: Insufficient documentation

## 2016-11-20 DIAGNOSIS — J45909 Unspecified asthma, uncomplicated: Secondary | ICD-10-CM | POA: Insufficient documentation

## 2016-11-20 DIAGNOSIS — Z79899 Other long term (current) drug therapy: Secondary | ICD-10-CM | POA: Insufficient documentation

## 2016-11-20 DIAGNOSIS — Z7951 Long term (current) use of inhaled steroids: Secondary | ICD-10-CM | POA: Diagnosis not present

## 2016-11-20 DIAGNOSIS — Z87891 Personal history of nicotine dependence: Secondary | ICD-10-CM | POA: Diagnosis not present

## 2016-11-20 DIAGNOSIS — I110 Hypertensive heart disease with heart failure: Secondary | ICD-10-CM | POA: Insufficient documentation

## 2016-11-20 DIAGNOSIS — M069 Rheumatoid arthritis, unspecified: Secondary | ICD-10-CM | POA: Diagnosis not present

## 2016-11-20 DIAGNOSIS — I252 Old myocardial infarction: Secondary | ICD-10-CM | POA: Diagnosis not present

## 2016-11-21 ENCOUNTER — Telehealth: Payer: Self-pay | Admitting: Rheumatology

## 2016-11-21 NOTE — Telephone Encounter (Signed)
-----   Message from Carole Binning, LPN sent at [phone]  8:29 AM EST ----- Regarding: Please schedule patient for follow up visit Please schedule patient for follow up visit. Patient due March 2018. Thanks!

## 2016-11-21 NOTE — Telephone Encounter (Signed)
ok 

## 2016-11-21 NOTE — Telephone Encounter (Signed)
Last Visit: 08/22/16 Next Visit due March 2018. Message sent to the front to schedule patient.  Labs: 08/23/16 WNL PLQ Eye Exam 01/30/16 WNL  Okay to refill PLQ?

## 2016-11-21 NOTE — Telephone Encounter (Signed)
LMOM for patient to call back to schedule appointment.  

## 2016-11-21 NOTE — Progress Notes (Signed)
Cardiac Individual Treatment Plan  Patient Details  Name: [name] MRN: [id number] Date of Birth: [birth year] Referring Provider:   Flowsheet Row CARDIAC REHAB PHASE II ORIENTATION from 08/20/2016 in [place]  Referring Provider  Daneen Schick MD      Initial Encounter Date:  [place] from 08/20/2016 in [place]  Date  08/20/16  Referring Provider  Daneen Schick MD      Visit Diagnosis: 07/04/16 NSTEMI (non-ST elevated myocardial infarction) [facility])  Patient's Home Medications on Admission:  Current Outpatient Prescriptions:  .  acetaminophen (TYLENOL) 325 MG tablet, Take 2 tablets (650 mg total) by mouth every 4 (four) hours as needed for headache or mild pain., Disp: , Rfl:  .  albuterol (PROVENTIL HFA;VENTOLIN HFA) 108 (90 Base) MCG/ACT inhaler, Inhale into the lungs every 6 (six) hours as needed for wheezing or shortness of breath., Disp: , Rfl:  .  aspirin 81 MG tablet, Take 81 mg by mouth daily.  , Disp: , Rfl:  .  atorvastatin (LIPITOR) 40 MG tablet, Take 1 tablet (40 mg total) by mouth daily at 6 PM., Disp: 90 tablet, Rfl: 3 .  budesonide-formoterol (SYMBICORT) 160-4.5 MCG/ACT inhaler, Inhale 2 puffs into the lungs 2 (two) times daily., Disp: , Rfl:  .  calcium carbonate (OS-CAL) 600 MG TABS, Take 600 mg by mouth 2 (two) times daily with a meal.  , Disp: , Rfl:  .  carvedilol (COREG) 3.125 MG tablet, Take 1 tablet (3.125 mg total) by mouth 2 (two) times daily., Disp: 180 tablet, Rfl: 3 .  cholecalciferol (VITAMIN D) 1000 UNITS tablet, Take 1,000 Units by mouth daily.  , Disp: , Rfl:  .  citalopram (CELEXA) 40 MG tablet, Take 40 mg by mouth daily.  , Disp: , Rfl:  .  clonazePAM (KLONOPIN) 0.5 MG tablet, Take 0.5 mg by mouth every 12 (twelve) hours as needed for anxiety., Disp: , Rfl: 0 .  folic acid (FOLVITE) 0.5 MG tablet, Take 0.5 mg by mouth daily., Disp: , Rfl:  .   hydroxychloroquine (PLAQUENIL) 200 MG tablet, TAKE 1 TABLET BY MOUTH TWICE DAILY ON MONDAY THROUGH FRIDAY, Disp: 120 tablet, Rfl: 0 .  losartan (COZAAR) 25 MG tablet, Take 1 tablet (25 mg total) by mouth daily., Disp: 90 tablet, Rfl: 3 .  methotrexate (RHEUMATREX) 2.5 MG tablet, TAKE 6 TABLETS BY MOUTH 1 TIME WEEKLY, Disp: 72 tablet, Rfl: 0 .  nitroGLYCERIN (NITROSTAT) 0.4 MG SL tablet, Place 1 tablet (0.4 mg total) under the tongue every 5 (five) minutes as needed for chest pain., Disp: 25 tablet, Rfl: 2 .  pantoprazole (PROTONIX) 40 MG tablet, Take 1 tablet (40 mg total) by mouth daily., Disp: 30 tablet, Rfl: 5  Past Medical History: Past Medical History:  Diagnosis Date  . Anxiety   . Asthma   . Breast cancer ([place])    left breast  . Depression   . Habitual alcohol use   . Hypertension   . RA (rheumatoid arthritis) ([place])   . Spinal stenosis     Tobacco Use: History  Smoking Status  . Former Smoker  Smokeless Tobacco  . Never Used    Comment: quit 1995    Labs: Recent Review Flowsheet Data    Labs for ITP Cardiac and Pulmonary Rehab Latest Ref Rng & Units 07/05/2016   Cholestrol 0 - 200 mg/dL 166   LDLCALC 0 - 99 mg/dL 94  HDL >40 mg/dL 54   Trlycerides <150 mg/dL 88      Capillary Blood Glucose: Lab Results  Component Value Date   GLUCAP 118 (H) 07/06/2016     Exercise Target Goals:    Exercise Program Goal: Individual exercise prescription set with THRR, safety & activity barriers. Participant demonstrates ability to understand and report RPE using BORG scale, to self-measure pulse accurately, and to acknowledge the importance of the exercise prescription.  Exercise Prescription Goal: Starting with aerobic activity 30 plus minutes a day, 3 days per week for initial exercise prescription. Provide home exercise prescription and guidelines that participant acknowledges understanding prior to discharge.  Activity Barriers & Risk Stratification:     Activity  Barriers & Cardiac Risk Stratification - 08/20/16 1454      Activity Barriers & Cardiac Risk Stratification   Activity Barriers Arthritis;Back Problems   Cardiac Risk Stratification High      6 Minute Walk:     6 Minute Walk    Row Name 08/20/16 1657         6 Minute Walk   Phase Initial     Distance 1163 feet     Walk Time 6 minutes     # of Rest Breaks 0     MPH 2.2     METS 2.42     RPE 11     VO2 Peak 8.46     Symptoms Yes (comment)     Comments Participant c/o bilateral thigh soreness during walk.     Resting HR 67 bpm     Resting BP 129/60     Max Ex. HR 89 bpm     Max Ex. BP 122/68     2 Minute Post BP 122/68        Initial Exercise Prescription:     Initial Exercise Prescription - 08/21/16 0800      Date of Initial Exercise RX and Referring Provider   Date 08/20/16   Referring Provider Daneen Schick MD     Bike   Level 0.5   Minutes 10   METs 2.45     NuStep   Level 1   Minutes 10   METs 2.4     Track   Laps 8   Minutes 10   METs 2.39     Prescription Details   Frequency (times per week) 2   Duration Progress to 30 minutes of continuous aerobic without signs/symptoms of physical distress     Intensity   THRR 40-80% of Max Heartrate 59-118   Ratings of Perceived Exertion 11-13   Perceived Dyspnea 0-4     Progression   Progression Continue to progress workloads to maintain intensity without signs/symptoms of physical distress.     Resistance Training   Training Prescription Yes   Weight 1lb   Reps 10-12      Perform Capillary Blood Glucose checks as needed.  Exercise Prescription Changes:     Exercise Prescription Changes    Row Name 08/28/16 1600 09/13/16 1400 09/24/16 1100 10/22/16 1100 11/19/16 1100     Exercise Review   Progression  - Yes -  -  -     Response to Exercise   Blood Pressure (Admit) 120/62 116/60 120/62 118/54 118/60   Blood Pressure (Exercise) 114/60 110/60 128/60 122/60 138/82   Blood Pressure (Exit)  110/60 116/58 112/64 124/64 128/76   Heart Rate (Admit) 88 bpm 78 bpm 87 bpm 90 bpm 76 bpm   Heart Rate (Exercise)  98 bpm 103 bpm 105 bpm 98 bpm 98 bpm   Heart Rate (Exit) 86 bpm 78 bpm 92 bpm 88 bpm 76 bpm   Rating of Perceived Exertion (Exercise) _0 Symptoms  - none none none none   Comments  - Reviewed home exercise guidelines on  09/02/16. Reviewed home exercise guidelines on  09/02/16. Reviewed home exercise guidelines on  09/02/16. Reviewed home exercise guidelines on  09/02/16.   Duration  - Progress to 30 minutes of continuous aerobic without signs/symptoms of physical distress Progress to 30 minutes of continuous aerobic without signs/symptoms of physical distress Progress to 30 minutes of continuous aerobic without signs/symptoms of physical distress Progress to 30 minutes of continuous aerobic without signs/symptoms of physical distress   Intensity  - THRR unchanged THRR unchanged THRR unchanged THRR unchanged     Progression   Progression  - Continue to progress workloads to maintain intensity without signs/symptoms of physical distress. Continue to progress workloads to maintain intensity without signs/symptoms of physical distress. Continue to progress workloads to maintain intensity without signs/symptoms of physical distress. Continue to progress workloads to maintain intensity without signs/symptoms of physical distress.   Average METs 2.25 2.8 2._1 Resistance Training   Training Prescription _2    Weight 1lb 2lbs 2lbs 2lbs 2lbs on L 3lbs on E   Reps 10-12 -  -  - 10-12     Interval Training   Interval Training  - No No No No     Bike   Level 0.5 0.5 0.5 -  -   Minutes _3 -  -   METs 2.45 2.47 2.47 -  -     Recumbant Bike   Level  -  -  - 2 2.5   Watts  -  -  - 35 35   Minutes  -  -  - 10 10   METs  -  -  - 2.97 2.6     NuStep   Level _4 Minutes 1._5 METs 2.4 3.4 3._6 Track   Laps _7 Minutes _8 METs 2.39 2.74 2.92 2.74 3.09     Home Exercise Plan   Plans to continue exercise at  - [place]   Frequency  - Add 2 additional days to program exercise sessions. Add 2 additional days to program exercise sessions. Add 2 additional days to program exercise sessions. Add 2 additional days to program exercise sessions.      Exercise Comments:     Exercise Comments    Row Name 08/28/16 1703 09/02/16 1400 09/13/16 1435 10/22/16 1149 11/19/16 1143   Exercise Comments Pt is doing well with exercise prescription Reviewed home exercies guidelines with participant. She plans to walk starting at 20 minutes 1-2 days per week and gradually increase to 30 minutes 1-2 days per week. Participant is walking 25 minutes, 2 days per week at home. Reviewed METs and goals. Pt is tolerating exercise well; will continue to monitor exercise progression. Reviewed METs and goals. Pt is tolerating exercise well; will continue to monitor exercise progression.      Discharge Exercise Prescription (Final Exercise Prescription Changes):     Exercise Prescription Changes - 11/19/16 1100      Response  to Exercise   Blood Pressure (Admit) 118/60   Blood Pressure (Exercise) 138/82   Blood Pressure (Exit) 128/76   Heart Rate (Admit) 76 bpm   Heart Rate (Exercise) 98 bpm   Heart Rate (Exit) 76 bpm   Rating of Perceived Exertion (Exercise) 13   Symptoms none   Comments Reviewed home exercise guidelines on  09/02/16.   Duration Progress to 30 minutes of continuous aerobic without signs/symptoms of physical distress   Intensity THRR unchanged     Progression   Progression Continue to progress workloads to maintain intensity without signs/symptoms of physical distress.   Average METs 3     Resistance Training   Training Prescription Yes   Weight 2lbs on L 3lbs on E   Reps 10-12     Interval Training   Interval Training No     Recumbant Bike   Level 2.5   Watts  35   Minutes 10   METs 2.6     NuStep   Level 4   Minutes 10   METs 3     Track   Laps 12   Minutes 10   METs 3.09     Home Exercise Plan   Plans to continue exercise at Home   Frequency Add 2 additional days to program exercise sessions.      Nutrition:  Target Goals: Understanding of nutrition guidelines, daily intake of sodium <1523m, cholesterol <2032m calories 30% from fat and 7% or less from saturated fats, daily to have 5 or more servings of fruits and vegetables.  Biometrics:     Pre Biometrics - 08/20/16 1659      Pre Biometrics   Height _0  (1.6 m)   Weight 143 lb 8.3 oz (65.1 kg)   Waist Circumference 33.75 inches   Hip Circumference 40.75 inches   Waist to Hip Ratio 0.83 %   BMI (Calculated) 25.5   Triceps Skinfold 18 mm   % Body Fat 35.5 %   Grip Strength 24 kg   Flexibility 11 in   Single Leg Stand 3.93 seconds       Nutrition Therapy Plan and Nutrition Goals:     Nutrition Therapy & Goals - 08/23/16 1150      Nutrition Therapy   Diet Therapeutic Lifestyle Changes     Intervention Plan   Intervention Prescribe, educate and counsel regarding individualized specific dietary modifications aiming towards targeted core components such as weight, hypertension, lipid management, diabetes, heart failure and other comorbidities.   Expected Outcomes Short Term Goal: Understand basic principles of dietary content, such as calories, fat, sodium, cholesterol and nutrients.;Long Term Goal: Adherence to prescribed nutrition plan.      Nutrition Discharge: Nutrition Scores:     Nutrition Assessments - 08/23/16 1150      MEDFICTS Scores   Pre Score 42      Nutrition Goals Re-Evaluation:   Psychosocial: Target Goals: Acknowledge presence or absence of depression, maximize coping skills, provide positive support system. Participant is able to verbalize types and ability to use techniques and skills needed for reducing stress and  depression.  Initial Review & Psychosocial Screening:   Quality of Life Scores:     Quality of Life - 08/20/16 1659      Quality of Life Scores   Health/Function Pre 19.18 %   Socioeconomic Pre 27 %   Psych/Spiritual Pre 21.63 %   Family Pre 21.75 %   GLOBAL Pre 21.65 %      PHQ-9:  Recent Review Flowsheet Data    Depression screen [facility] 2/9 08/26/2016   Decreased Interest 0   Down, Depressed, Hopeless 0   PHQ - 2 Score 0      Psychosocial Evaluation and Intervention:     Psychosocial Evaluation - 11/21/16 1705      Psychosocial Evaluation & Interventions   Interventions Encouraged to exercise with the program and follow exercise prescription   Comments Pt with no psychosocial needs identified. No interventions needed.   Continued Psychosocial Services Needed No     Discharge Psychosocial Assessment & Intervention   Discharge Continue support measures as needed      Psychosocial Re-Evaluation:     Psychosocial Re-Evaluation    [place] Name 09/26/16 1516 10/24/16 1608 11/21/16 1706         Psychosocial Re-Evaluation   Interventions Encouraged to attend Cardiac Rehabilitation for the exercise Encouraged to attend Cardiac Rehabilitation for the exercise Encouraged to attend Cardiac Rehabilitation for the exercise     Comments  -  - Pt doing well in rehab, enjoys participating in exercise.  Interacts appropriately with fellow participants.        Vocational Rehabilitation: Provide vocational rehab assistance to qualifying candidates.   Vocational Rehab Evaluation & Intervention:   Education: Education Goals: Education classes will be provided on a weekly basis, covering required topics. Participant will state understanding/return demonstration of topics presented.  Learning Barriers/Preferences:     Learning Barriers/Preferences - 08/21/16 0809      Learning Barriers/Preferences   Learning Barriers None   Learning Preferences None      Education  Topics: Count Your Pulse:  -Group instruction provided by verbal instruction, demonstration, patient participation and written materials to support subject.  Instructors address importance of being able to find your pulse and how to count your pulse when at home without a heart monitor.  Patients get hands on experience counting their pulse with staff help and individually. Flowsheet Row CARDIAC REHAB PHASE II EXERCISE from 11/20/2016 in [place]  Date  09/20/16  Educator  Andi Hence, RN  Instruction Review Code  2- meets goals/outcomes      Heart Attack, Angina, and Risk Factor Modification:  -Group instruction provided by verbal instruction, video, and written materials to support subject.  Instructors address signs and symptoms of angina and heart attacks.    Also discuss risk factors for heart disease and how to make changes to improve heart health risk factors. Flowsheet Row CARDIAC REHAB PHASE II EXERCISE from 11/20/2016 in [place]  Date  10/18/16 [hypertension]  Educator  Arville Go RIon RN  Instruction Review Code  2- meets goals/outcomes      Functional Fitness:  -Group instruction provided by verbal instruction, demonstration, patient participation, and written materials to support subject.  Instructors address safety measures for doing things around the house.  Discuss how to get up and down off the floor, how to pick things up properly, how to safely get out of a chair without assistance, and balance training. Flowsheet Row CARDIAC REHAB PHASE II EXERCISE from 11/20/2016 in [place]  Date  09/27/16  Instruction Review Code  2- meets goals/outcomes      Meditation and Mindfulness:  -Group instruction provided by verbal instruction, patient participation, and written materials to support subject.  Instructor addresses importance of mindfulness and meditation practice to help reduce stress  and improve awareness.  Instructor also leads participants through a meditation  exercise.  Flowsheet Row CARDIAC REHAB PHASE II EXERCISE from 11/20/2016 in [place]  Date  11/13/16  Instruction Review Code  2- meets goals/outcomes      Stretching for Flexibility and Mobility:  -Group instruction provided by verbal instruction, patient participation, and written materials to support subject.  Instructors lead participants through series of stretches that are designed to increase flexibility thus improving mobility.  These stretches are additional exercise for major muscle groups that are typically performed during regular warm up and cool down. Flowsheet Row CARDIAC REHAB PHASE II EXERCISE from 11/20/2016 in [place]  Date  10/23/16  Educator  Seward Carol  Instruction Review Code  2- meets goals/outcomes      Hands Only CPR Anytime:  -Group instruction provided by verbal instruction, video, patient participation and written materials to support subject.  Instructors co-teach with [facility] video for hands only CPR.  Participants get hands on experience with mannequins.   Nutrition I class: Heart Healthy Eating:  -Group instruction provided by PowerPoint slides, verbal discussion, and written materials to support subject matter. The instructor gives an explanation and review of the Therapeutic Lifestyle Changes diet recommendations, which includes a discussion on lipid goals, dietary fat, sodium, fiber, plant stanol/sterol esters, sugar, and the components of a well-balanced, healthy diet. Flowsheet Row CARDIAC REHAB PHASE II EXERCISE from 11/20/2016 in [place]  Date  10/21/16  Educator  RD  Instruction Review Code  Not applicable [class handouts given]      Nutrition II class: Lifestyle Skills:  -Group instruction provided by PowerPoint slides, verbal discussion, and written materials to  support subject matter. The instructor gives an explanation and review of label reading, grocery shopping for heart health, heart healthy recipe modifications, and ways to make healthier choices when eating out. Flowsheet Row CARDIAC REHAB PHASE II EXERCISE from 11/20/2016 in [place]  Date  10/21/16  Educator  RD  Instruction Review Code  Not applicable [class handouts given]      Diabetes Question & Answer:  -Group instruction provided by PowerPoint slides, verbal discussion, and written materials to support subject matter. The instructor gives an explanation and review of diabetes co-morbidities, pre- and post-prandial blood glucose goals, pre-exercise blood glucose goals, signs, symptoms, and treatment of hypoglycemia and hyperglycemia, and foot care basics. Flowsheet Row CARDIAC REHAB PHASE II EXERCISE from 11/20/2016 in [place]  Date  08/30/16  Educator  RD  Instruction Review Code  2- meets goals/outcomes      Diabetes Blitz:  -Group instruction provided by PowerPoint slides, verbal discussion, and written materials to support subject matter. The instructor gives an explanation and review of the physiology behind type 1 and type 2 diabetes, diabetes medications and rational behind using different medications, pre- and post-prandial blood glucose recommendations and Hemoglobin A1c goals, diabetes diet, and exercise including blood glucose guidelines for exercising safely.    Portion Distortion:  -Group instruction provided by PowerPoint slides, verbal discussion, written materials, and food models to support subject matter. The instructor gives an explanation of serving size versus portion size, changes in portions sizes over the last 20 years, and what consists of a serving from each food group. Flowsheet Row CARDIAC REHAB PHASE II EXERCISE from 11/20/2016 in [place]  Date  11/20/16   Educator  RD  Instruction Review Code  2- meets goals/outcomes  Stress Management:  -Group instruction provided by verbal instruction, video, and written materials to support subject matter.  Instructors review role of stress in heart disease and how to cope with stress positively.   Flowsheet Row CARDIAC REHAB PHASE II EXERCISE from 11/20/2016 in [place]  Date  10/02/16  Instruction Review Code  2- meets goals/outcomes      Exercising on Your Own:  -Group instruction provided by verbal instruction, power point, and written materials to support subject.  Instructors discuss benefits of exercise, components of exercise, frequency and intensity of exercise, and end points for exercise.  Also discuss use of nitroglycerin and activating EMS.  Review options of places to exercise outside of rehab.  Review guidelines for sex with heart disease. Flowsheet Row CARDIAC REHAB PHASE II EXERCISE from 11/20/2016 in [place]  Date  10/04/16  Educator  Seward Carol  Instruction Review Code  2- meets goals/outcomes      Cardiac Drugs I:  -Group instruction provided by verbal instruction and written materials to support subject.  Instructor reviews cardiac drug classes: antiplatelets, anticoagulants, beta blockers, and statins.  Instructor discusses reasons, side effects, and lifestyle considerations for each drug class. Flowsheet Row CARDIAC REHAB PHASE II EXERCISE from 11/20/2016 in [place]  Date  10/30/16  Educator  Pharmacist  Instruction Review Code  2- meets goals/outcomes      Cardiac Drugs II:  -Group instruction provided by verbal instruction and written materials to support subject.  Instructor reviews cardiac drug classes: angiotensin converting enzyme inhibitors (ACE-I), angiotensin II receptor blockers (ARBs), nitrates, and calcium channel blockers.  Instructor discusses reasons,  side effects, and lifestyle considerations for each drug class.   Anatomy and Physiology of the Circulatory System:  -Group instruction provided by verbal instruction, video, and written materials to support subject.  Reviews functional anatomy of heart, how it relates to various diagnoses, and what role the heart plays in the overall system.   Knowledge Questionnaire Score:     Knowledge Questionnaire Score - 08/20/16 1658      Knowledge Questionnaire Score   Pre Score 18/24      Core Components/Risk Factors/Patient Goals at Admission:     Personal Goals and Risk Factors at Admission - 08/20/16 1654      Core Components/Risk Factors/Patient Goals on Admission   Hypertension Yes   Intervention Provide education on lifestyle modifcations including regular physical activity/exercise, weight management, moderate sodium restriction and increased consumption of fresh fruit, vegetables, and low fat dairy, alcohol moderation, and smoking cessation.;Monitor prescription use compliance.   Expected Outcomes Short Term: Continued assessment and intervention until BP is < 140/81m HG in hypertensive participants. < 130/825mHG in hypertensive participants with diabetes, heart failure or chronic kidney disease.;Long Term: Maintenance of blood pressure at goal levels.   Stress Yes   Intervention Offer individual and/or small group education and counseling on adjustment to heart disease, stress management and health-related lifestyle change. Teach and support self-help strategies.   Expected Outcomes Short Term: Participant demonstrates changes in health-related behavior, relaxation and other stress management skills, ability to obtain effective social support, and compliance with psychotropic medications if prescribed.;Long Term: Emotional wellbeing is indicated by absence of clinically significant psychosocial distress or social isolation.   Personal Goal Other Yes   Personal Goal Increase energy.    Intervention Provide advice, counseling, support, and education about physical activity/exercise needs.   Expected Outcomes Achieve increased cardiorespiratory  fitness shown through measurement of functional fitness and personal statement.      Core Components/Risk Factors/Patient Goals Review:      Goals and Risk Factor Review    Row Name 08/28/16 1655 09/13/16 1435 10/22/16 0815 10/22/16 0817 11/21/16 1504     Core Components/Risk Factors/Patient Goals Review   Personal Goals Review Other Other Other;Increase Strength and Stamina  -  -   Review Pt plans to walk at least 3 days/week at home Energy level hasn't increased yet, but participant has begun home exercise 2 days per week in addition to 2 days per week at cardiac rehab. Pt is "starting to feel a difference" and feel as though energy levels are improving. Pt stated being 70% of where she used to be  - Pt is tolerating exercise very well and is able to increase MET average and SPM on Nustep   Expected Outcomes To increase energy and fitness level Increase workloads to help increase energy and fitness level. In cardiac rehab staff will continue to increase workloads to help increase energy and fitness level. In cardiac rehab staff will continue to increase workloads to help increase energy and fitness level per pt's tolerance Pt will continue to show progression and increase in exercise tolerance      Core Components/Risk Factors/Patient Goals at Discharge (Final Review):      Goals and Risk Factor Review - 11/21/16 1504      Core Components/Risk Factors/Patient Goals Review   Review Pt is tolerating exercise very well and is able to increase MET average and SPM on Nustep   Expected Outcomes Pt will continue to show progression and increase in exercise tolerance      ITP Comments:     ITP Comments    Row Name 08/20/16 1654 11/15/16 1134         ITP Comments Medical Director- Dr. Fransico Him, MD Attended CPR education  class; goals and outcomes met.         Comments:  Pt is making expected progress toward personal goals after completing   25 sessions. Pt regularly attends cardiac rehab twice a week.RepeatPsychosocial Assessment: Pt with supportive family, denies any Psychosocial needs or interventions at this time. Pt has returned to those activities she finds pleasurable.  Recommend continued exercise and life style modification education including  stress management and relaxation techniques to decrease cardiac risk profile. Cherre Huger, BSN

## 2016-11-22 ENCOUNTER — Encounter (HOSPITAL_COMMUNITY)
Admission: RE | Admit: 2016-11-22 | Discharge: 2016-11-22 | Disposition: A | Discharge status: Home / Self Care | Payer: Medicare Other | Source: Ambulatory Visit | Attending: Interventional Cardiology | Admitting: Interventional Cardiology

## 2016-11-22 DIAGNOSIS — I252 Old myocardial infarction: Secondary | ICD-10-CM | POA: Diagnosis not present

## 2016-11-22 DIAGNOSIS — I214 Non-ST elevation (NSTEMI) myocardial infarction: Secondary | ICD-10-CM

## 2016-11-25 ENCOUNTER — Encounter (HOSPITAL_COMMUNITY)
Admission: RE | Admit: 2016-11-25 | Discharge: 2016-11-25 | Disposition: A | Discharge status: Home / Self Care | Payer: Medicare Other | Source: Ambulatory Visit | Attending: Interventional Cardiology | Admitting: Interventional Cardiology

## 2016-11-25 DIAGNOSIS — I252 Old myocardial infarction: Secondary | ICD-10-CM | POA: Diagnosis not present

## 2016-11-25 DIAGNOSIS — I214 Non-ST elevation (NSTEMI) myocardial infarction: Secondary | ICD-10-CM

## 2016-11-26 ENCOUNTER — Other Ambulatory Visit: Payer: Self-pay | Admitting: *Deleted

## 2016-11-26 DIAGNOSIS — Z79899 Other long term (current) drug therapy: Secondary | ICD-10-CM

## 2016-11-27 ENCOUNTER — Encounter (HOSPITAL_COMMUNITY): Payer: Medicare Other

## 2016-11-27 LAB — CBC WITH DIFFERENTIAL/PLATELET
BASOS ABS: 55 {cells}/uL (ref 0–200)
Basophils Relative: 1 %
EOS PCT: 12 %
Eosinophils Absolute: 660 cells/uL — ABNORMAL HIGH (ref 15–500)
HCT: 42 % (ref 35.0–45.0)
HEMOGLOBIN: 13.6 g/dL (ref 11.7–15.5)
LYMPHS ABS: 1155 {cells}/uL (ref 850–3900)
LYMPHS PCT: 21 %
MCH: 30.6 pg (ref 27.0–33.0)
MCHC: 32.4 g/dL (ref 32.0–36.0)
MCV: 94.4 fL (ref 80.0–100.0)
MONOS PCT: 13 %
MPV: 8.9 fL (ref 7.5–12.5)
Monocytes Absolute: 715 cells/uL (ref 200–950)
NEUTROS PCT: 53 %
Neutro Abs: 2915 cells/uL (ref 1500–7800)
PLATELETS: 242 10*3/uL (ref 140–400)
RBC: 4.45 MIL/uL (ref 3.80–5.10)
RDW: 15.6 % — AB (ref 11.0–15.0)
WBC: 5.5 10*3/uL (ref 3.8–10.8)

## 2016-11-27 LAB — COMPLETE METABOLIC PANEL WITH GFR
ALBUMIN: 3.6 g/dL (ref 3.6–5.1)
ALK PHOS: 50 U/L (ref 33–130)
ALT: 26 U/L (ref 6–29)
AST: 30 U/L (ref 10–35)
BUN: 17 mg/dL (ref 7–25)
CHLORIDE: 103 mmol/L (ref 98–110)
CO2: 27 mmol/L (ref 20–31)
Calcium: 8.8 mg/dL (ref 8.6–10.4)
Creat: 0.69 mg/dL (ref 0.60–0.93)
GFR, Est African American: >89 mL/min (ref >=60)
GFR, Est Non African American: 87 mL/min (ref >=60)
GLUCOSE: 82 mg/dL (ref 65–99)
POTASSIUM: 4.4 mmol/L (ref 3.5–5.3)
SODIUM: 140 mmol/L (ref 135–146)
Total Bilirubin: 0.4 mg/dL (ref 0.2–1.2)
Total Protein: 5.9 g/dL — ABNORMAL LOW (ref 6.1–8.1)

## 2016-11-27 NOTE — Progress Notes (Signed)
Labs normal.

## 2016-11-29 ENCOUNTER — Encounter (HOSPITAL_COMMUNITY)
Admission: RE | Admit: 2016-11-29 | Discharge: 2016-11-29 | Disposition: A | Discharge status: Home / Self Care | Payer: Medicare Other | Source: Ambulatory Visit | Attending: Interventional Cardiology | Admitting: Interventional Cardiology

## 2016-11-29 DIAGNOSIS — I252 Old myocardial infarction: Secondary | ICD-10-CM | POA: Diagnosis not present

## 2016-11-29 DIAGNOSIS — I214 Non-ST elevation (NSTEMI) myocardial infarction: Secondary | ICD-10-CM

## 2016-12-02 ENCOUNTER — Encounter (HOSPITAL_COMMUNITY)
Admission: RE | Admit: 2016-12-02 | Discharge: 2016-12-02 | Disposition: A | Discharge status: Home / Self Care | Payer: Medicare Other | Source: Ambulatory Visit | Attending: Interventional Cardiology | Admitting: Interventional Cardiology

## 2016-12-02 DIAGNOSIS — I214 Non-ST elevation (NSTEMI) myocardial infarction: Secondary | ICD-10-CM

## 2016-12-02 DIAGNOSIS — I252 Old myocardial infarction: Secondary | ICD-10-CM | POA: Diagnosis not present

## 2016-12-04 ENCOUNTER — Encounter (HOSPITAL_COMMUNITY): Payer: Medicare Other

## 2016-12-06 ENCOUNTER — Encounter (HOSPITAL_COMMUNITY): Payer: Medicare Other

## 2016-12-09 ENCOUNTER — Encounter (HOSPITAL_COMMUNITY): Payer: Medicare Other

## 2016-12-11 ENCOUNTER — Encounter (HOSPITAL_COMMUNITY): Payer: Medicare Other

## 2016-12-11 ENCOUNTER — Telehealth (HOSPITAL_COMMUNITY): Payer: Self-pay | Admitting: Family Medicine

## 2016-12-13 ENCOUNTER — Encounter (HOSPITAL_COMMUNITY): Payer: Medicare Other

## 2016-12-16 ENCOUNTER — Encounter (HOSPITAL_COMMUNITY): Payer: Medicare Other

## 2016-12-18 ENCOUNTER — Encounter (HOSPITAL_COMMUNITY): Payer: Medicare Other

## 2016-12-19 ENCOUNTER — Encounter (HOSPITAL_COMMUNITY): Payer: Self-pay | Admitting: *Deleted

## 2016-12-19 NOTE — Progress Notes (Signed)
Cardiac Individual Treatment Plan  Patient Details  Name: [name] MRN: [id number] Date of Birth: [birth year] Referring Provider:   Flowsheet Row CARDIAC REHAB PHASE II ORIENTATION from 08/20/2016 in [place]  Referring Provider  Daneen Schick MD      Initial Encounter Date:  [place] PHASE II ORIENTATION from 08/20/2016 in [place]  Date  08/20/16  Referring Provider  Daneen Schick MD      Visit Diagnosis: No diagnosis found.  Patient's Home Medications on Admission:  Current Outpatient Prescriptions:  .  acetaminophen (TYLENOL) 325 MG tablet, Take 2 tablets (650 mg total) by mouth every 4 (four) hours as needed for headache or mild pain., Disp: , Rfl:  .  albuterol (PROVENTIL HFA;VENTOLIN HFA) 108 (90 Base) MCG/ACT inhaler, Inhale into the lungs every 6 (six) hours as needed for wheezing or shortness of breath., Disp: , Rfl:  .  aspirin 81 MG tablet, Take 81 mg by mouth daily.  , Disp: , Rfl:  .  atorvastatin (LIPITOR) 40 MG tablet, Take 1 tablet (40 mg total) by mouth daily at 6 PM., Disp: 90 tablet, Rfl: 3 .  budesonide-formoterol (SYMBICORT) 160-4.5 MCG/ACT inhaler, Inhale 2 puffs into the lungs 2 (two) times daily., Disp: , Rfl:  .  calcium carbonate (OS-CAL) 600 MG TABS, Take 600 mg by mouth 2 (two) times daily with a meal.  , Disp: , Rfl:  .  carvedilol (COREG) 3.125 MG tablet, Take 1 tablet (3.125 mg total) by mouth 2 (two) times daily., Disp: 180 tablet, Rfl: 3 .  cholecalciferol (VITAMIN D) 1000 UNITS tablet, Take 1,000 Units by mouth daily.  , Disp: , Rfl:  .  citalopram (CELEXA) 40 MG tablet, Take 40 mg by mouth daily.  , Disp: , Rfl:  .  clonazePAM (KLONOPIN) 0.5 MG tablet, Take 0.5 mg by mouth every 12 (twelve) hours as needed for anxiety., Disp: , Rfl: 0 .  folic acid (FOLVITE) 0.5 MG tablet, Take 0.5 mg by mouth daily., Disp: , Rfl:  .  hydroxychloroquine (PLAQUENIL) 200 MG  tablet, TAKE 1 TABLET BY MOUTH TWICE DAILY ON MONDAY THROUGH FRIDAY, Disp: 120 tablet, Rfl: 0 .  losartan (COZAAR) 25 MG tablet, Take 1 tablet (25 mg total) by mouth daily., Disp: 90 tablet, Rfl: 3 .  methotrexate (RHEUMATREX) 2.5 MG tablet, TAKE 6 TABLETS BY MOUTH 1 TIME WEEKLY, Disp: 72 tablet, Rfl: 0 .  nitroGLYCERIN (NITROSTAT) 0.4 MG SL tablet, Place 1 tablet (0.4 mg total) under the tongue every 5 (five) minutes as needed for chest pain., Disp: 25 tablet, Rfl: 2 .  pantoprazole (PROTONIX) 40 MG tablet, Take 1 tablet (40 mg total) by mouth daily., Disp: 30 tablet, Rfl: 5  Past Medical History: Past Medical History:  Diagnosis Date  . Anxiety   . Asthma   . Breast cancer ([place])    left breast  . Depression   . Habitual alcohol use   . Hypertension   . RA (rheumatoid arthritis) ([place])   . Spinal stenosis     Tobacco Use: History  Smoking Status  . Former Smoker  Smokeless Tobacco  . Never Used    Comment: quit 1995    Labs: Recent Review Flowsheet Data    Labs for ITP Cardiac and Pulmonary Rehab Latest Ref Rng & Units 07/05/2016   Cholestrol 0 - 200 mg/dL 166   LDLCALC 0 - 99 mg/dL 94   HDL >40 mg/dL 54  Trlycerides <150 mg/dL 88      Capillary Blood Glucose: Lab Results  Component Value Date   GLUCAP 118 (H) 07/06/2016     Exercise Target Goals:    Exercise Program Goal: Individual exercise prescription set with THRR, safety & activity barriers. Participant demonstrates ability to understand and report RPE using BORG scale, to self-measure pulse accurately, and to acknowledge the importance of the exercise prescription.  Exercise Prescription Goal: Starting with aerobic activity 30 plus minutes a day, 3 days per week for initial exercise prescription. Provide home exercise prescription and guidelines that participant acknowledges understanding prior to discharge.  Activity Barriers & Risk Stratification:   6 Minute Walk:   Initial Exercise  Prescription:   Perform Capillary Blood Glucose checks as needed.  Exercise Prescription Changes:     Exercise Prescription Changes    Row Name 10/22/16 1100 11/19/16 1100 12/19/16 0900         Response to Exercise   Blood Pressure (Admit) 118/54 118/60 120/64     Blood Pressure (Exercise) 122/60 138/82 120/58     Blood Pressure (Exit) 124/64 128/76 118/60     Heart Rate (Admit) 90 bpm 76 bpm 75 bpm     Heart Rate (Exercise) 98 bpm 98 bpm 96 bpm     Heart Rate (Exit) 88 bpm 76 bpm 76 bpm     Rating of Perceived Exertion (Exercise) '13 13 11     ' Symptoms none none none     Comments Reviewed home exercise guidelines on  09/02/16. Reviewed home exercise guidelines on  09/02/16. Reviewed home exercise guidelines on  09/02/16.     Duration Progress to 30 minutes of continuous aerobic without signs/symptoms of physical distress Progress to 30 minutes of continuous aerobic without signs/symptoms of physical distress Progress to 30 minutes of continuous aerobic without signs/symptoms of physical distress     Intensity THRR unchanged THRR unchanged THRR unchanged       Progression   Progression Continue to progress workloads to maintain intensity without signs/symptoms of physical distress. Continue to progress workloads to maintain intensity without signs/symptoms of physical distress. Continue to progress workloads to maintain intensity without signs/symptoms of physical distress.     Average METs 3 3 3.2       Resistance Training   Training Prescription Yes Yes Yes     Weight 2lbs 2lbs on L 3lbs on E 2lbs on L 3lbs on E     Reps  - 10-12 10-12       Interval Training   Interval Training No No No       Bike   Level -  -  -     Minutes -  -  -     METs -  -  -       Recumbant Bike   Level 2 2.5 -     Watts 35 35 -     Minutes 10 10 -     METs 2.97 2.6 -       NuStep   Level '4 4 4     ' Minutes '10 10 20     ' METs '3 3 3       ' Track   Laps '10 12 12     ' Minutes '10 10 10      ' METs 2.74 3.09 3.09       Home Exercise Plan   Plans to continue exercise at [facility]     Frequency Add 2 additional  days to program exercise sessions. Add 2 additional days to program exercise sessions. Add 2 additional days to program exercise sessions.        Exercise Comments:     Exercise Comments    Row Name 10/22/16 1149 11/19/16 1143 12/19/16 0953       Exercise Comments Reviewed METs and goals. Pt is tolerating exercise well; will continue to monitor exercise progression. Reviewed METs and goals. Pt is tolerating exercise well; will continue to monitor exercise progression. Pt has been out sick with cold-like symptoms. Goals and METs were not reviewed due to absence. Will continue to monitor pt's exercise progression        Discharge Exercise Prescription (Final Exercise Prescription Changes):     Exercise Prescription Changes - 12/19/16 0900      Response to Exercise   Blood Pressure (Admit) 120/64   Blood Pressure (Exercise) 120/58   Blood Pressure (Exit) 118/60   Heart Rate (Admit) 75 bpm   Heart Rate (Exercise) 96 bpm   Heart Rate (Exit) 76 bpm   Rating of Perceived Exertion (Exercise) 11   Symptoms none   Comments Reviewed home exercise guidelines on  09/02/16.   Duration Progress to 30 minutes of continuous aerobic without signs/symptoms of physical distress   Intensity THRR unchanged     Progression   Progression Continue to progress workloads to maintain intensity without signs/symptoms of physical distress.   Average METs 3.2     Resistance Training   Training Prescription Yes   Weight 2lbs on L 3lbs on E   Reps 10-12     Interval Training   Interval Training No     Recumbant Bike   Level --   Watts --   Minutes --   METs --     NuStep   Level 4   Minutes 20   METs 3     Track   Laps 12   Minutes 10   METs 3.09     Home Exercise Plan   Plans to continue exercise at Home   Frequency Add 2 additional days to program exercise  sessions.      Nutrition:  Target Goals: Understanding of nutrition guidelines, daily intake of sodium <1563m, cholesterol <201m calories 30% from fat and 7% or less from saturated fats, daily to have 5 or more servings of fruits and vegetables.  Biometrics:    Nutrition Therapy Plan and Nutrition Goals:   Nutrition Discharge: Nutrition Scores:   Nutrition Goals Re-Evaluation:   Psychosocial: Target Goals: Acknowledge presence or absence of depression, maximize coping skills, provide positive support system. Participant is able to verbalize types and ability to use techniques and skills needed for reducing stress and depression.  Initial Review & Psychosocial Screening:   Quality of Life Scores:   PHQ-9: Recent Review Flowsheet Data    [facility]   Decreased Interest 0   Down, Depressed, Hopeless 0   PHQ - 2 Score 0      Psychosocial Evaluation and Intervention:     Psychosocial Evaluation - 12/19/16 1546      Psychosocial Evaluation & Interventions   Interventions Encouraged to exercise with the program and follow exercise prescription;Stress management education;Relaxation education   Comments Pt with no psychosocial needs identified. No interventions needed.   Continued Psychosocial Services Needed No      Psychosocial Re-Evaluation:     Psychosocial Re-Evaluation    [place] 10/24/16 [id number]/04/18 1706 12/19/16 1546  Psychosocial Re-Evaluation   Interventions Encouraged to attend Cardiac Rehabilitation for the exercise Encouraged to attend Cardiac Rehabilitation for the exercise Encouraged to attend Cardiac Rehabilitation for the exercise;Relaxation education;Stress management education     Comments  - Pt doing well in rehab, enjoys participating in exercise.  Interacts appropriately with fellow participants. Pt doing well in rehab, enjoys participating in exercise.  Interacts appropriately with fellow participants.         Vocational Rehabilitation: Provide vocational rehab assistance to qualifying candidates.   Vocational Rehab Evaluation & Intervention:   Education: Education Goals: Education classes will be provided on a weekly basis, covering required topics. Participant will state understanding/return demonstration of topics presented.  Learning Barriers/Preferences:   Education Topics: Count Your Pulse:  -Group instruction provided by verbal instruction, demonstration, patient participation and written materials to support subject.  Instructors address importance of being able to find your pulse and how to count your pulse when at home without a heart monitor.  Patients get hands on experience counting their pulse with staff help and individually. Flowsheet Row CARDIAC REHAB PHASE II EXERCISE from 11/22/2016 in [facility]  Date  11/22/16  Educator  Barnet Pall, RN  Instruction Review Code  2- meets goals/outcomes      Heart Attack, Angina, and Risk Factor Modification:  -Group instruction provided by verbal instruction, video, and written materials to support subject.  Instructors address signs and symptoms of angina and heart attacks.    Also discuss risk factors for heart disease and how to make changes to improve heart health risk factors. Flowsheet Row CARDIAC REHAB PHASE II EXERCISE from 11/22/2016 in [facility]  Date  10/18/16 [hypertension]  Educator  Arville Go RIon RN  Instruction Review Code  2- meets goals/outcomes      Functional Fitness:  -Group instruction provided by verbal instruction, demonstration, patient participation, and written materials to support subject.  Instructors address safety measures for doing things around the house.  Discuss how to get up and down off the floor, how to pick things up properly, how to safely get out of a chair without assistance, and balance training. Flowsheet Row CARDIAC REHAB  PHASE II EXERCISE from 11/22/2016 in [facility]  Date  09/27/16  Instruction Review Code  2- meets goals/outcomes      Meditation and Mindfulness:  -Group instruction provided by verbal instruction, patient participation, and written materials to support subject.  Instructor addresses importance of mindfulness and meditation practice to help reduce stress and improve awareness.  Instructor also leads participants through a meditation exercise.  Flowsheet Row CARDIAC REHAB PHASE II EXERCISE from 11/22/2016 in [facility]  Date  11/13/16  Instruction Review Code  2- meets goals/outcomes      Stretching for Flexibility and Mobility:  -Group instruction provided by verbal instruction, patient participation, and written materials to support subject.  Instructors lead participants through series of stretches that are designed to increase flexibility thus improving mobility.  These stretches are additional exercise for major muscle groups that are typically performed during regular warm up and cool down. Flowsheet Row CARDIAC REHAB PHASE II EXERCISE from 11/22/2016 in [facility]  Date  10/23/16  Educator  Seward Carol  Instruction Review Code  2- meets goals/outcomes      Hands Only CPR Anytime:  -Group instruction provided by verbal instruction, video, patient participation and written materials to support subject.  Instructors co-teach with [facility] video for hands only CPR.  Participants get hands on experience with mannequins.   Nutrition I class: Heart Healthy Eating:  -Group instruction provided by PowerPoint slides, verbal discussion, and written materials to support subject matter. The instructor gives an explanation and review of the Therapeutic Lifestyle Changes diet recommendations, which includes a discussion on lipid goals, dietary fat, sodium, fiber, plant stanol/sterol esters, sugar, and the  components of a well-balanced, healthy diet. Flowsheet Row CARDIAC REHAB PHASE II EXERCISE from 11/22/2016 in [facility]  Date  10/21/16  Educator  RD  Instruction Review Code  Not applicable [class handouts given]      Nutrition II class: Lifestyle Skills:  -Group instruction provided by PowerPoint slides, verbal discussion, and written materials to support subject matter. The instructor gives an explanation and review of label reading, grocery shopping for heart health, heart healthy recipe modifications, and ways to make healthier choices when eating out. Flowsheet Row CARDIAC REHAB PHASE II EXERCISE from 11/22/2016 in [place]  Date  10/21/16  Educator  RD  Instruction Review Code  Not applicable [class handouts given]      Diabetes Question & Answer:  -Group instruction provided by PowerPoint slides, verbal discussion, and written materials to support subject matter. The instructor gives an explanation and review of diabetes co-morbidities, pre- and post-prandial blood glucose goals, pre-exercise blood glucose goals, signs, symptoms, and treatment of hypoglycemia and hyperglycemia, and foot care basics. Flowsheet Row CARDIAC REHAB PHASE II EXERCISE from 11/22/2016 in [facility]  Date  08/30/16  Educator  RD  Instruction Review Code  2- meets goals/outcomes      Diabetes Blitz:  -Group instruction provided by PowerPoint slides, verbal discussion, and written materials to support subject matter. The instructor gives an explanation and review of the physiology behind type 1 and type 2 diabetes, diabetes medications and rational behind using different medications, pre- and post-prandial blood glucose recommendations and Hemoglobin A1c goals, diabetes diet, and exercise including blood glucose guidelines for exercising safely.    Portion Distortion:  -Group instruction provided by PowerPoint  slides, verbal discussion, written materials, and food models to support subject matter. The instructor gives an explanation of serving size versus portion size, changes in portions sizes over the last 20 years, and what consists of a serving from each food group. Flowsheet Row CARDIAC REHAB PHASE II EXERCISE from 11/22/2016 in [facility]  Date  11/20/16  Educator  RD  Instruction Review Code  2- meets goals/outcomes      Stress Management:  -Group instruction provided by verbal instruction, video, and written materials to support subject matter.  Instructors review role of stress in heart disease and how to cope with stress positively.   Flowsheet Row CARDIAC REHAB PHASE II EXERCISE from 11/22/2016 in [facility]  Date  10/02/16  Instruction Review Code  2- meets goals/outcomes      Exercising on Your Own:  -Group instruction provided by verbal instruction, power point, and written materials to support subject.  Instructors discuss benefits of exercise, components of exercise, frequency and intensity of exercise, and end points for exercise.  Also discuss use of nitroglycerin and activating EMS.  Review options of places to exercise outside of rehab.  Review guidelines for sex with heart disease. Flowsheet Row CARDIAC REHAB PHASE II EXERCISE from 11/22/2016 in [facility]  Date  10/04/16  Educator  Seward Carol  Instruction Review Code  2- meets goals/outcomes      Cardiac Drugs I:  -Group instruction provided by verbal instruction and written materials to support subject.  Instructor reviews cardiac drug classes: antiplatelets, anticoagulants, beta blockers, and statins.  Instructor discusses reasons, side effects, and lifestyle considerations for each drug class. Flowsheet Row CARDIAC REHAB PHASE II EXERCISE from 11/22/2016 in [facility]  Date  10/30/16  Educator   Pharmacist  Instruction Review Code  2- meets goals/outcomes      Cardiac Drugs II:  -Group instruction provided by verbal instruction and written materials to support subject.  Instructor reviews cardiac drug classes: angiotensin converting enzyme inhibitors (ACE-I), angiotensin II receptor blockers (ARBs), nitrates, and calcium channel blockers.  Instructor discusses reasons, side effects, and lifestyle considerations for each drug class.   Anatomy and Physiology of the Circulatory System:  -Group instruction provided by verbal instruction, video, and written materials to support subject.  Reviews functional anatomy of heart, how it relates to various diagnoses, and what role the heart plays in the overall system.   Knowledge Questionnaire Score:   Core Components/Risk Factors/Patient Goals at Admission:   Core Components/Risk Factors/Patient Goals Review:      Goals and Risk Factor Review    Row Name 10/22/16 0815 10/22/16 0817 11/21/16 1504 12/19/16 0955       Core Components/Risk Factors/Patient Goals Review   Personal Goals Review Other;Increase Strength and Stamina  -  -  -    Review Pt is "starting to feel a difference" and feel as though energy levels are improving. Pt stated being 70% of where she used to be  - Pt is tolerating exercise very well and is able to increase MET average and SPM on Nustep Pt exercise tolerance has increased and is evident in most current MET level. Pt is averaging 3.2 mets on exercise equipment.     Expected Outcomes In cardiac rehab staff will continue to increase workloads to help increase energy and fitness level. In cardiac rehab staff will continue to increase workloads to help increase energy and fitness level per pt's tolerance Pt will continue to show progression and increase in exercise tolerance Pt will continue to show progression and increase in exercise tolerance       Core Components/Risk Factors/Patient Goals at Discharge (Final  Review):      Goals and Risk Factor Review - 12/19/16 0955      Core Components/Risk Factors/Patient Goals Review   Review Pt exercise tolerance has increased and is evident in most current MET level. Pt is averaging 3.2 mets on exercise equipment.    Expected Outcomes Pt will continue to show progression and increase in exercise tolerance      ITP Comments:     ITP Comments    Row Name 11/15/16 1134           ITP Comments Attended CPR education class; goals and outcomes met.           Comments:  Pt is making expected progress toward personal goals after completing 29 sessions. Pt presently out due to cold/flu.  Pt max date to attend is 12/24/15. Psychosocial Assessment  Pt with good support at home. Pt interacts positively with other participants. Pt has healthy coping skills. Generally feels good about her outlook. Recommend continued exercise and life style modification education including  stress management and relaxation techniques to decrease cardiac risk profile. Takyra Cantrall [place]  RN, BSN

## 2016-12-20 ENCOUNTER — Encounter (HOSPITAL_COMMUNITY): Payer: Medicare Other

## 2016-12-23 ENCOUNTER — Encounter (HOSPITAL_COMMUNITY)
Admission: RE | Admit: 2016-12-23 | Discharge: 2016-12-23 | Disposition: A | Discharge status: Home / Self Care | Payer: Medicare Other | Source: Ambulatory Visit | Attending: Interventional Cardiology | Admitting: Interventional Cardiology

## 2016-12-23 VITALS — Ht 63.0 in | Wt 135.6 lb

## 2016-12-23 DIAGNOSIS — J45909 Unspecified asthma, uncomplicated: Secondary | ICD-10-CM | POA: Diagnosis not present

## 2016-12-23 DIAGNOSIS — Z79899 Other long term (current) drug therapy: Secondary | ICD-10-CM | POA: Diagnosis not present

## 2016-12-23 DIAGNOSIS — Z7902 Long term (current) use of antithrombotics/antiplatelets: Secondary | ICD-10-CM | POA: Insufficient documentation

## 2016-12-23 DIAGNOSIS — F329 Major depressive disorder, single episode, unspecified: Secondary | ICD-10-CM | POA: Diagnosis not present

## 2016-12-23 DIAGNOSIS — Z7951 Long term (current) use of inhaled steroids: Secondary | ICD-10-CM | POA: Diagnosis not present

## 2016-12-23 DIAGNOSIS — I110 Hypertensive heart disease with heart failure: Secondary | ICD-10-CM | POA: Diagnosis not present

## 2016-12-23 DIAGNOSIS — I509 Heart failure, unspecified: Secondary | ICD-10-CM | POA: Diagnosis not present

## 2016-12-23 DIAGNOSIS — Z853 Personal history of malignant neoplasm of breast: Secondary | ICD-10-CM | POA: Insufficient documentation

## 2016-12-23 DIAGNOSIS — Z87891 Personal history of nicotine dependence: Secondary | ICD-10-CM | POA: Insufficient documentation

## 2016-12-23 DIAGNOSIS — M069 Rheumatoid arthritis, unspecified: Secondary | ICD-10-CM | POA: Diagnosis not present

## 2016-12-23 DIAGNOSIS — Z9861 Coronary angioplasty status: Secondary | ICD-10-CM | POA: Diagnosis not present

## 2016-12-23 DIAGNOSIS — I252 Old myocardial infarction: Secondary | ICD-10-CM | POA: Diagnosis present

## 2016-12-23 DIAGNOSIS — I214 Non-ST elevation (NSTEMI) myocardial infarction: Secondary | ICD-10-CM

## 2016-12-23 NOTE — Progress Notes (Signed)
Discharge Summary  Patient Details  Name: [name] MRN: [id number] Date of Birth: 1946-02-16 Referring Provider:   Flowsheet Row CARDIAC REHAB PHASE II ORIENTATION from 08/20/2016 in [place]  Referring Provider  Daneen Schick MD       Number of Visits: 30 in 18 weeks  Reason for Discharge:  Patient reached a stable level of exercise. Patient independent in their exercise.  Smoking History:  History  Smoking Status  . Former Smoker  Smokeless Tobacco  . Never Used    Comment: quit 1995    Diagnosis:  07/04/16 NSTEMI (non-ST elevated myocardial infarction) ([place])  ADL [facility]:   Initial Exercise Prescription:     Initial Exercise Prescription - 08/21/16 0800      Date of Initial Exercise RX and Referring Provider   Date 08/20/16   Referring Provider Daneen Schick MD     Bike   Level 0.5   Minutes 10   METs 2.45     NuStep   Level 1   Minutes 10   METs 2.4     Track   Laps 8   Minutes 10   METs 2.39     Prescription Details   Frequency (times per week) 2   Duration Progress to 30 minutes of continuous aerobic without signs/symptoms of physical distress     Intensity   THRR 40-80% of Max Heartrate 59-118   Ratings of Perceived Exertion 11-13   Perceived Dyspnea 0-4     Progression   Progression Continue to progress workloads to maintain intensity without signs/symptoms of physical distress.     Resistance Training   Training Prescription Yes   Weight 1lb   Reps 10-12      Discharge Exercise Prescription (Final Exercise Prescription Changes):     Exercise Prescription Changes - 12/23/16 1713      Response to Exercise   Blood Pressure (Admit) 116/70   Blood Pressure (Exercise) 132/70   Blood Pressure (Exit) 102/60   Heart Rate (Admit) 87 bpm   Heart Rate (Exercise) 97 bpm   Heart Rate (Exit) 82 bpm   Rating of Perceived Exertion (Exercise) 12   Symptoms none   Comments Reviewed home exercise  guidelines on 09/02/16   Duration Progress to 30 minutes of continuous aerobic without signs/symptoms of physical distress   Intensity THRR New     Progression   Progression Continue to progress workloads to maintain intensity without signs/symptoms of physical distress.   Average METs 3.2     Resistance Training   Training Prescription Yes   Weight 2lbs on L, 3lbs on R   Reps 10-12     Interval Training   Interval Training No     NuStep   Level 4   Minutes 20   METs 3.3     Track   Laps 12   Minutes 10   METs 3.09     Home Exercise Plan   Plans to continue exercise at [facility] on 09/02/16   Frequency Add 2 additional days to program exercise sessions.      Functional Capacity:     6 Minute Walk    Row Name 08/20/16 1657 12/31/16 1708       6 Minute Walk   Phase Initial Discharge    Distance 1163 feet 1269 feet    Distance % Change  - 9.11 %    Walk Time 6 minutes 6 minutes    # of  Rest Breaks 0 0    MPH 2.2 2.4    METS 2.42 2.81    RPE 11 11    VO2 Peak 8.46 9.85    Symptoms Yes (comment) Yes (comment)    Comments Participant c/o bilateral thigh soreness during walk. fatigue    Resting HR 67 bpm 87 bpm    Resting BP 129/60 116/70    Max Ex. HR 89 bpm 97 bpm    Max Ex. BP 122/68 132/70    2 Minute Post BP 122/68 102/60       Psychological, QOL, Others - Outcomes: PHQ 2/9: Depression screen [facility] 2/9 12/23/2016 08/26/2016  Decreased Interest 0 0  Down, Depressed, Hopeless 0 0  PHQ - 2 Score 0 0    Quality of Life:     Quality of Life - 12/31/16 1721      Quality of Life Scores   Health/Function Pre 19.18 %   Health/Function Post 28 %   Health/Function % Change 45.99 %   Socioeconomic Pre 27 %   Socioeconomic Post 30 %   Socioeconomic % Change  11.11 %   Psych/Spiritual Pre 21.63 %   Psych/Spiritual Post 25.5 %   Psych/Spiritual % Change 17.89 %   Family Pre 21.75 %   Family Post 18.1 %   Family % Change -16.78 %   GLOBAL Pre  21.65 %   GLOBAL Post 26.06 %   GLOBAL % Change 20.37 %      Personal Goals: Goals established at orientation with interventions provided to work toward goal.     Personal Goals and Risk Factors at Admission - 08/20/16 1654      Core Components/Risk Factors/Patient Goals on Admission   Hypertension Yes   Intervention Provide education on lifestyle modifcations including regular physical activity/exercise, weight management, moderate sodium restriction and increased consumption of fresh fruit, vegetables, and low fat dairy, alcohol moderation, and smoking cessation.;Monitor prescription use compliance.   Expected Outcomes Short Term: Continued assessment and intervention until BP is < 140/50m HG in hypertensive participants. < 130/865mHG in hypertensive participants with diabetes, heart failure or chronic kidney disease.;Long Term: Maintenance of blood pressure at goal levels.   Stress Yes   Intervention Offer individual and/or small group education and counseling on adjustment to heart disease, stress management and health-related lifestyle change. Teach and support self-help strategies.   Expected Outcomes Short Term: Participant demonstrates changes in health-related behavior, relaxation and other stress management skills, ability to obtain effective social support, and compliance with psychotropic medications if prescribed.;Long Term: Emotional wellbeing is indicated by absence of clinically significant psychosocial distress or social isolation.   Personal Goal Other Yes   Personal Goal Increase energy.   Intervention Provide advice, counseling, support, and education about physical activity/exercise needs.   Expected Outcomes Achieve increased cardiorespiratory fitness shown through measurement of functional fitness and personal statement.       Personal Goals Discharge:     Goals and Risk Factor Review    Row Name 08/28/16 1655 09/13/16 1435 10/22/16 0815 10/22/16 0817 11/21/16  1504     Core Components/Risk Factors/Patient Goals Review   Personal Goals Review Other Other Other;Increase Strength and Stamina  -  -   Review Pt plans to walk at least 3 days/week at home Energy level hasn't increased yet, but participant has begun home exercise 2 days per week in addition to 2 days per week at cardiac rehab. Pt is "starting to feel a difference" and feel as  though energy levels are improving. Pt stated being 70% of where she used to be  - Pt is tolerating exercise very well and is able to increase MET average and SPM on Nustep   Expected Outcomes To increase energy and fitness level Increase workloads to help increase energy and fitness level. In cardiac rehab staff will continue to increase workloads to help increase energy and fitness level. In cardiac rehab staff will continue to increase workloads to help increase energy and fitness level per pt's tolerance Pt will continue to show progression and increase in exercise tolerance   Row Name 12/19/16 0955 12/31/16 1720           Core Components/Risk Factors/Patient Goals Review   Review Pt exercise tolerance has increased and is evident in most current MET level. Pt is averaging 3.2 mets on exercise equipment.  Pt has met goal of increased or improved energy levels.       Expected Outcomes Pt will continue to show progression and increase in exercise tolerance Pt will continue to improve in cardiorespiratory fitness and overall well being         Nutrition & Weight - Outcomes:     Pre Biometrics - 12/31/16 1720      Pre Biometrics   Height _0  (1.6 m)   Weight 135 lb 9.3 oz (61.5 kg)   Waist Circumference 34 inches   Hip Circumference 40 inches   Waist to Hip Ratio 0.85 %   BMI (Calculated) 24.1   Triceps Skinfold 14 mm   % Body Fat 33.6 %   Grip Strength 25 kg   Flexibility 11.5 in   Single Leg Stand 3.4 seconds       Nutrition:     Nutrition Therapy & Goals - 01/01/17 0800      Nutrition Therapy    Diet Therapeutic Lifestyle Changes     Personal Nutrition Goals   Personal Goal #1 Wt loss of 1-2 lb/week to a wt loss goal of 6-24 lb at graduation from [place], educate and counsel regarding individualized specific dietary modifications aiming towards targeted core components such as weight, hypertension, lipid management, diabetes, heart failure and other comorbidities.   Expected Outcomes Short Term Goal: Understand basic principles of dietary content, such as calories, fat, sodium, cholesterol and nutrients.;Long Term Goal: Adherence to prescribed nutrition plan.      Nutrition Discharge:     Nutrition Assessments - 12/26/16 1114      MEDFICTS Scores   Pre Score 42   Post Score 12   Score Difference -30      Education Questionnaire Score:     Knowledge Questionnaire Score - 12/23/16 1216      Knowledge Questionnaire Score   Post Score 22/24      Goals reviewed with patient. Pt graduated from cardiac rehab program today with completion of 30 exercise sessions in Phase II for 18 weeks. Pt maintained good attendance twice a week and progressed nicely during his participation in rehab as evidenced by increased MET level.   Medication list reconciled. Repeat  PHQ score- 0.  Pt completed post assessment quality of life survey.  Pt scored the following     Quality of Life - 12/31/16 1721      Quality of Life Scores   Health/Function Pre 19.18 %   Health/Function Post 28 %   Health/Function % Change 45.99 %   Socioeconomic Pre 27 %  Socioeconomic Post 30 %   Socioeconomic % Change  11.11 %   Psych/Spiritual Pre 21.63 %   Psych/Spiritual Post 25.5 %   Psych/Spiritual % Change 17.89 %   Family Pre 21.75 %   Family Post 18.1 %   Family % Change -16.78 %   GLOBAL Pre 21.65 %   GLOBAL Post 26.06 %   GLOBAL % Change 20.37 %     Pt showed significant progress in all areas except for family.  Pt desires to have a  closer relationship with extended family.   Pt has made significant lifestyle changes and should be commended for her success. Pt feels he has achieved her goals during cardiac rehab. Pt has increased energy and able to complete activities with no problem.  Pt plans to continue exercise three times a week with silver sneakers and walking on the opposite days.  It was a pleasure to work with this patient. Cherre Huger, BSN

## 2016-12-24 ENCOUNTER — Other Ambulatory Visit: Payer: Self-pay | Admitting: Rheumatology

## 2016-12-24 NOTE — Telephone Encounter (Signed)
Last Visit: 08/22/16 Next visit: 01/28/17 Labs: 11/26/16 WNL  Okay to refill MTX?

## 2017-01-02 ENCOUNTER — Encounter (HOSPITAL_COMMUNITY): Payer: Self-pay | Admitting: *Deleted

## 2017-01-07 ENCOUNTER — Other Ambulatory Visit: Payer: Self-pay | Admitting: *Deleted

## 2017-01-07 MED ORDER — PANTOPRAZOLE SODIUM 40 MG PO TBEC: 40.0000 mg | DELAYED_RELEASE_TABLET | Freq: Every day | ORAL | 8 refills | Status: DC

## 2017-01-07 NOTE — Telephone Encounter (Signed)
Patient was started on this medication by Jettie Booze, NP. She is requesting a refill from Dr Tamala Julian. Okay to refill? Please advise. Thanks, MI

## 2017-01-07 NOTE — Telephone Encounter (Signed)
That's fine. Thanks!

## 2017-01-15 ENCOUNTER — Ambulatory Visit (INDEPENDENT_AMBULATORY_CARE_PROVIDER_SITE_OTHER): Payer: Medicare Other | Admitting: *Deleted

## 2017-01-15 ENCOUNTER — Telehealth: Payer: Self-pay | Admitting: Cardiology

## 2017-01-15 DIAGNOSIS — I442 Atrioventricular block, complete: Secondary | ICD-10-CM

## 2017-01-15 NOTE — Telephone Encounter (Signed)
Spoke with pt and reminded pt of remote transmission that is due today. Pt verbalized understanding.   

## 2017-01-15 NOTE — Progress Notes (Signed)
Remote pacemaker transmission.   

## 2017-01-16 ENCOUNTER — Encounter: Payer: Self-pay | Admitting: Cardiology

## 2017-01-17 DIAGNOSIS — M0579 Rheumatoid arthritis with rheumatoid factor of multiple sites without organ or systems involvement: Secondary | ICD-10-CM | POA: Insufficient documentation

## 2017-01-17 DIAGNOSIS — M19071 Primary osteoarthritis, right ankle and foot: Secondary | ICD-10-CM | POA: Insufficient documentation

## 2017-01-17 DIAGNOSIS — M19042 Primary osteoarthritis, left hand: Secondary | ICD-10-CM

## 2017-01-17 DIAGNOSIS — M19072 Primary osteoarthritis, left ankle and foot: Secondary | ICD-10-CM

## 2017-01-17 DIAGNOSIS — Z79899 Other long term (current) drug therapy: Secondary | ICD-10-CM | POA: Insufficient documentation

## 2017-01-17 DIAGNOSIS — M47812 Spondylosis without myelopathy or radiculopathy, cervical region: Secondary | ICD-10-CM | POA: Insufficient documentation

## 2017-01-17 DIAGNOSIS — M47816 Spondylosis without myelopathy or radiculopathy, lumbar region: Secondary | ICD-10-CM | POA: Insufficient documentation

## 2017-01-17 DIAGNOSIS — M19041 Primary osteoarthritis, right hand: Secondary | ICD-10-CM | POA: Insufficient documentation

## 2017-01-17 LAB — CUP PACEART REMOTE DEVICE CHECK
Brady Statistic AP VS Percent: 0.01 %
Brady Statistic AS VP Percent: 99.87 %
Brady Statistic AS VS Percent: 0.03 %
Implantable Lead Implant Date: 20170818
Implantable Lead Location: 753860
Implantable Lead Model: 4396
Implantable Lead Model: 5076
Implantable Lead Model: 5076
Implantable Pulse Generator Implant Date: 20170818
Lead Channel Impedance Value: 342 Ohm
Lead Channel Impedance Value: 380 Ohm
Lead Channel Impedance Value: 551 Ohm
Lead Channel Impedance Value: 589 Ohm
Lead Channel Impedance Value: 665 Ohm
Lead Channel Impedance Value: 722 Ohm
Lead Channel Pacing Threshold Amplitude: 0.5 V
Lead Channel Pacing Threshold Pulse Width: 0.4 ms
Lead Channel Pacing Threshold Pulse Width: 0.4 ms
Lead Channel Sensing Intrinsic Amplitude: 2.5 mV
Lead Channel Sensing Intrinsic Amplitude: 3.5 mV
Lead Channel Sensing Intrinsic Amplitude: 3.5 mV
Lead Channel Setting Pacing Amplitude: 1.5 V
Lead Channel Setting Pacing Amplitude: 2 V
Lead Channel Setting Pacing Pulse Width: 0.4 ms
Lead Channel Setting Pacing Pulse Width: 0.4 ms
MDC IDC LEAD IMPLANT DT: 20170818
MDC IDC LEAD IMPLANT DT: 20170818
MDC IDC LEAD LOCATION: 753858
MDC IDC LEAD LOCATION: 753859
MDC IDC MSMT BATTERY REMAINING LONGEVITY: 86 mo
MDC IDC MSMT BATTERY VOLTAGE: 3.03 V
MDC IDC MSMT LEADCHNL LV IMPEDANCE VALUE: 437 Ohm
MDC IDC MSMT LEADCHNL LV IMPEDANCE VALUE: 513 Ohm
MDC IDC MSMT LEADCHNL RA IMPEDANCE VALUE: 475 Ohm
MDC IDC MSMT LEADCHNL RA PACING THRESHOLD AMPLITUDE: 0.5 V
MDC IDC MSMT LEADCHNL RA SENSING INTR AMPL: 2.5 mV
MDC IDC MSMT LEADCHNL RV PACING THRESHOLD AMPLITUDE: 0.375 V
MDC IDC MSMT LEADCHNL RV PACING THRESHOLD PULSEWIDTH: 0.4 ms
MDC IDC SESS DTM: 20180301023141
MDC IDC SET LEADCHNL RA PACING AMPLITUDE: 1.5 V
MDC IDC SET LEADCHNL RV SENSING SENSITIVITY: 0.9 mV
MDC IDC STAT BRADY AP VP PERCENT: 0.09 %
MDC IDC STAT BRADY RA PERCENT PACED: 0.1 %
MDC IDC STAT BRADY RV PERCENT PACED: 99.84 %

## 2017-01-17 NOTE — Progress Notes (Signed)
Office Visit Note  Patient: [name]             Date of Birth: 02-Oct-1946           MRN: [id number]             PCP: Marjorie Smolder, MD Referring: Darcus Austin, MD Visit Date: 01/28/2017 Occupation: '@GUAROCC' @    Subjective:  Pain right foot   History of Present Illness: [name] is a 71 y.o. female with history of sero positive rheumatoid arthritis and osteoarthritis and disc disease. According to patient she has not had much joint swelling. She's been having some discomfort in the dorsum of her right foot. Which causes pain well she walks. She has minimal discomfort in her hands. She's been tolerating her medications without any side effects.  Activities of Daily Living:  Patient reports morning stiffness for 5 minutes.   Patient Denies nocturnal pain.  Difficulty dressing/grooming: Denies Difficulty climbing stairs: Denies Difficulty getting out of chair: Denies Difficulty using hands for taps, buttons, cutlery, and/or writing: Denies   Review of Systems  Constitutional: Negative for fatigue, night sweats, weight gain, weight loss and weakness.  HENT: Negative for mouth sores, trouble swallowing, trouble swallowing, mouth dryness and nose dryness.   Eyes: Negative for pain, redness, visual disturbance and dryness.  Respiratory: Negative for cough, shortness of breath and difficulty breathing.   Cardiovascular: Negative for chest pain, palpitations, hypertension, irregular heartbeat and swelling in legs/feet.  Gastrointestinal: Negative for blood in stool, constipation and diarrhea.  Endocrine: Negative for increased urination.  Genitourinary: Negative for vaginal dryness.  Musculoskeletal: Positive for arthralgias, joint pain and morning stiffness. Negative for joint swelling, myalgias, muscle weakness, muscle tenderness and myalgias.  Skin: Negative for color change, rash, hair loss, skin tightness, ulcers and sensitivity to sunlight.    Allergic/Immunologic: Negative for susceptible to infections.  Neurological: Negative for dizziness, memory loss and night sweats.  Hematological: Negative for swollen glands.  Psychiatric/Behavioral: Positive for depressed mood. Negative for sleep disturbance. The patient is nervous/anxious.     PMFS History:  Patient Active Problem List   Diagnosis Date Noted  . Former smoker 01/25/2017  . Rheumatoid arthritis involving multiple sites with positive rheumatoid factor ([place]) 01/17/2017  . High risk medication use 01/17/2017  . Primary osteoarthritis of both hands 01/17/2017  . Primary osteoarthritis of both feet 01/17/2017  . DJD (degenerative joint disease), cervical 01/17/2017  . Spondylosis of lumbar region without myelopathy or radiculopathy 01/17/2017  . Syncope and collapse 07/07/2016  . Cardiomyopathy, ischemic 07/07/2016  . CAD- S/P LAD thrombectomy 07/04/16 07/07/2016  . Pacemaker-MDT BiV placed 07/05/16 07/07/2016  . History of rheumatoid arthritis 07/07/2016  . Complete heart block ([place]) 07/04/2016  . Essential hypertension 07/04/2016  . LBBB (left bundle branch block) 07/04/2016  . Ventricular tachycardia ([place]) 07/04/2016  . Faintness 07/04/2016  . NSTEMI (non-ST elevated myocardial infarction) ([place]) 07/04/2016  . Exostosis 06/18/2011  . History of breast cancer 02/16/2010  . VITAMIN D DEFICIENCY 02/15/2010  . HYPERCHOLESTEROLEMIA 02/15/2010  . ANXIETY 02/15/2010  . Asthma 02/15/2010  . MIGRAINES, HX OF 02/15/2010  . MASTECTOMY, LEFT, HX OF 02/15/2010    Past Medical History:  Diagnosis Date  . Anxiety   . Asthma   . Breast cancer ([place])    left breast  . Depression   . Habitual alcohol use   . Hypertension   . RA (rheumatoid arthritis) ([place])   . Spinal stenosis     Family History  Problem Relation Age of Onset  . Stroke Mother   . Cancer Father     colon?  . Cancer Paternal Aunt   . Cancer Paternal Uncle    Past Surgical History:  Procedure  Laterality Date  . BREAST LUMPECTOMY  2005   left  . CARDIAC CATHETERIZATION N/A 07/04/2016   Procedure: Left Heart Cath and Coronary Angiography;  Surgeon: Belva Crome, MD;  Location: [facility];  Service: Cardiovascular;  Laterality: N/A;  . CARDIAC CATHETERIZATION N/A 07/04/2016   Procedure: Temporary Pacemaker;  Surgeon: Belva Crome, MD;  Location: [facility];  Service: Cardiovascular;  Laterality: N/A;  . EP IMPLANTABLE DEVICE N/A 07/05/2016   Procedure: Pacemaker Implant;  Surgeon: Evans Lance, MD;  Location: [facility];  Service: Cardiovascular;  Laterality: N/A;  . [place]  . LYMPHECTOMY  2005   Social History   Social History Narrative  . No narrative on file     Objective: Vital Signs: BP 122/60   Pulse 68   Resp 14   Ht '5\' 2"'  (1.575 m)   Wt 139 lb (63 kg)   BMI 25.42 kg/m    Physical Exam  Constitutional: She is oriented to person, place, and time. She appears well-developed and well-nourished.  HENT:  Head: Normocephalic and atraumatic.  Eyes: Conjunctivae and EOM are normal.  Neck: Normal range of motion.  Cardiovascular: Normal rate, regular rhythm, normal heart sounds and intact distal pulses.   Pacemaker  Pulmonary/Chest: Effort normal and breath sounds normal.  Abdominal: Soft. Bowel sounds are normal.  Lymphadenopathy:    She has no cervical adenopathy.  Neurological: She is alert and oriented to person, place, and time.  Skin: Skin is warm and dry. Capillary refill takes less than 2 seconds.  Psychiatric: She has a normal mood and affect. Her behavior is normal.  Nursing note and vitals reviewed.    Musculoskeletal Exam: C-spine and thoracic lumbar spine good range of motion she has some thoracic kyphosis. Shoulder joints elbow joints wrist joints are good range of motion. She has synovial thickening over her MCP joints with ulnar deviation with no synovitis was noted. Hip joints knee joints ankles were good range  of motion. She is some MTP changes without any synovitis. On her right foot she had a dorsal spur which is causing some discomfort.  CDAI Exam: CDAI Homunculus Exam:   Joint Counts:  CDAI Tender Joint count: 0 CDAI Swollen Joint count: 0  Global Assessments:  Patient Global Assessment: 1 Provider Global Assessment: 1  CDAI Calculated Score: 2    Investigation: Findings:  06/12/2015  X-rays of bilateral hands were reviewed from March 2016, which showed bilateral 2nd and 3rd MCP narrowing, more so on the right than the left, without any erosions.  We obtained x-rays of bilateral hands and they were compared with her films from 2012, which showed bilateral 1st MTP subluxation.  Left 2nd MTP subluxation.  She has bilateral calcaneal spurs with no erosive changes.  October 2014:  CBC, comprehensive metabolic panel, sed rate, hep panel, G6PD, uric acid, immunoglobulin, CCP, TB Gold, UA were all within normal limits.  Rheumatoid factor was 55 and ANA was 1:20 nuclear specked pattern.   Clinical Support on 01/15/2017  Component Date Value Ref Range Status  . Date Time Interrogation Session 01/16/2017 [id number]   Final  . Pulse Generator Manufacturer 01/16/2017 MERM   Final  . Pulse Gen Model 01/16/2017 [id number] Consulta CRT-P  Final  . Pulse Gen Serial Number 01/16/2017 [id number]   Final  . Clinic Name 01/16/2017 [facility]   Final  . Implantable Pulse Generator Type 01/16/2017 Cardiac Resynch Therapy Pacemaker   Final  . Implantable Pulse Generator Implan* 01/16/2017 [id number]   Final  . Implantable Lead Manufacturer 01/16/2017 MERM   Final  . Implantable Lead Model 01/16/2017 4396 Attain Ability Straight   Final  . Implantable Lead Serial Number 01/16/2017 [id number]   Final  . Implantable Lead Implant Date 01/16/2017 [id number]   Final  . Implantable Lead Location Detail 1 01/16/2017 UNKNOWN   Final  . Implantable Lead Location 01/16/2017 [id number]   Final  . Implantable Lead  Manufacturer 01/16/2017 MERM   Final  . Implantable Lead Model 01/16/2017 5076 CapSureFix Novus   Final  . Implantable Lead Serial Number 01/16/2017 [id number]   Final  . Implantable Lead Implant Date 01/16/2017 [id number]   Final  . Implantable Lead Location Detail 1 01/16/2017 APPENDAGE   Final  . Implantable Lead Location 01/16/2017 [id number]   Final  . Implantable Lead Manufacturer 01/16/2017 MERM   Final  . Implantable Lead Model 01/16/2017 5076 CapSureFix Novus   Final  . Implantable Lead Serial Number 01/16/2017 [id number]   Final  . Implantable Lead Implant Date 01/16/2017 [id number]   Final  . Implantable Lead Location Detail 1 01/16/2017 APEX   Final  . Implantable Lead Location 01/16/2017 [id number]   Final  . Lead Channel Setting Sensing Sensi* 01/16/2017 0.9  mV Final  . Lead Channel Setting Pacing Amplit* 01/16/2017 1.5  V Final  . Lead Channel Setting Pacing Pulse * 01/16/2017 0.4  ms Final  . Lead Channel Setting Pacing Amplit* 01/16/2017 2  V Final  . Lead Channel Setting Pacing Pulse * 01/16/2017 0.4  ms Final  . Lead Channel Setting Pacing Amplit* 01/16/2017 1.5  V Final  . Lead Channel Setting Pacing Captur* 01/16/2017 Adaptive Capture   Final  . Lead Channel Impedance Value 01/16/2017 475  ohm Final  . Lead Channel Impedance Value 01/16/2017 342  ohm Final  . Lead Channel Sensing Intrinsic Amp* 01/16/2017 2.5  mV Final  . Lead Channel Sensing Intrinsic Amp* 01/16/2017 2.5  mV Final  . Lead Channel Pacing Threshold Ampl* 01/16/2017 0.5  V Final  . Lead Channel Pacing Threshold Puls* 01/16/2017 0.4  ms Final  . Lead Channel Impedance Value 01/16/2017 665  ohm Final  . Lead Channel Impedance Value 01/16/2017 589  ohm Final  . Lead Channel Sensing Intrinsic Amp* 01/16/2017 3.5  mV Final  . Lead Channel Sensing Intrinsic Amp* 01/16/2017 3.5  mV Final  . Lead Channel Pacing Threshold Ampl* 01/16/2017 0.375  V Final  . Lead Channel Pacing Threshold Puls* 01/16/2017 0.4  ms Final  .  Lead Channel Impedance Value 01/16/2017 722  ohm Final  . Lead Channel Impedance Value 01/16/2017 437  ohm Final  . Lead Channel Impedance Value 01/16/2017 513  ohm Final  . Lead Channel Impedance Value 01/16/2017 551  ohm Final  . Lead Channel Impedance Value 01/16/2017 380  ohm Final  . Lead Channel Pacing Threshold Ampl* 01/16/2017 0.5  V Final  . Lead Channel Pacing Threshold Puls* 01/16/2017 0.4  ms Final  . Battery Status 01/16/2017 OK   Final  . Battery Remaining Longevity 01/16/2017 86  mo Final  . Battery Voltage 01/16/2017 3.03  V Final  . Brady Statistic RA Percent Paced 01/16/2017 0.10  % Final  . Brady Statistic RV Percent  Paced 01/16/2017 99.84  % Final  . Brady Statistic AP VP Percent 01/16/2017 0.09  % Final  . Brady Statistic AS VP Percent 01/16/2017 99.87  % Final  . Loletha Grayer Statistic AP VS Percent 01/16/2017 0.01  % Final  . Loletha Grayer Statistic AS VS Percent 01/16/2017 0.03  % Final  . Eval Rhythm 01/16/2017 AS,BiVP   Final  Orders Only on 11/26/2016  Component Date Value Ref Range Status  . WBC 11/26/2016 5.5  3.8 - 10.8 K/uL Final  . RBC 11/26/2016 4.45  3.80 - 5.10 MIL/uL Final  . Hemoglobin 11/26/2016 13.6  11.7 - 15.5 g/dL Final  . HCT 11/26/2016 42.0  35.0 - 45.0 % Final  . MCV 11/26/2016 94.4  80.0 - 100.0 fL Final  . MCH 11/26/2016 30.6  27.0 - 33.0 pg Final  . MCHC 11/26/2016 32.4  32.0 - 36.0 g/dL Final  . RDW 11/26/2016 15.6* 11.0 - 15.0 % Final  . Platelets 11/26/2016 242  140 - 400 K/uL Final  . MPV 11/26/2016 8.9  7.5 - 12.5 fL Final  . Neutro Abs 11/26/2016 2915  1,500 - 7,800 cells/uL Final  . Lymphs Abs 11/26/2016 1155  850 - 3,900 cells/uL Final  . Monocytes Absolute 11/26/2016 715  200 - 950 cells/uL Final  . Eosinophils Absolute 11/26/2016 660* 15 - 500 cells/uL Final  . Basophils Absolute 11/26/2016 55  0 - 200 cells/uL Final  . Neutrophils Relative % 11/26/2016 53  % Final  . Lymphocytes Relative 11/26/2016 21  % Final  . Monocytes Relative  11/26/2016 13  % Final  . Eosinophils Relative 11/26/2016 12  % Final  . Basophils Relative 11/26/2016 1  % Final  . Smear Review 11/26/2016 Criteria for review not met   Final  . Sodium 11/26/2016 140  135 - 146 mmol/L Final  . Potassium 11/26/2016 4.4  3.5 - 5.3 mmol/L Final  . Chloride 11/26/2016 103  98 - 110 mmol/L Final  . CO2 11/26/2016 27  20 - 31 mmol/L Final  . Glucose, Bld 11/26/2016 82  65 - 99 mg/dL Final  . BUN 11/26/2016 17  7 - 25 mg/dL Final  . Creat 11/26/2016 0.69  0.60 - 0.93 mg/dL Final   Comment:   For patients > or = 73 years of age: The upper reference limit for Creatinine is approximately 13% higher for people identified as African-American.     . Total Bilirubin 11/26/2016 0.4  0.2 - 1.2 mg/dL Final  . Alkaline Phosphatase 11/26/2016 50  33 - 130 U/L Final  . AST 11/26/2016 30  10 - 35 U/L Final  . ALT 11/26/2016 26  6 - 29 U/L Final  . Total Protein 11/26/2016 5.9* 6.1 - 8.1 g/dL Final  . Albumin 11/26/2016 3.6  3.6 - 5.1 g/dL Final  . Calcium 11/26/2016 8.8  8.6 - 10.4 mg/dL Final  . GFR, Est African American 11/26/2016 >89  >=60 mL/min Final  . GFR, Est Non African American 11/26/2016 87  >=60 mL/min Final    Imaging: No results found.  Speciality Comments: No specialty comments available.    Procedures:  No procedures performed Allergies: Betadine [povidone iodine] and Hydrocodone   Assessment / Plan:     Visit Diagnoses: Rheumatoid arthritis involving multiple sites with positive rheumatoid factor (HCC) - +RF +CCP +ANA. Patient has no active synovitis on examination.   High risk medication use - Methotrexate 6 tablets by mouth every week, folic acid 1 mg by mouth daily, Plaquenil 200  mg twice a day Monday to Friday -she is tolerating her medications well. We'll check her labs in April. Plan: CBC with Differential/Platelet, COMPLETE METABOLIC PANEL WITH GFR. She is aware that her eye exam is due this month.  Primary osteoarthritis of both  hands: Muscle strengthening and joint protection was discussed and demonstrated in the office.  Primary osteoarthritis of both feet: Her right foot dorsal spurs causing discomfort proper fitting shoes were discussed.  DDD cervical spine  DDD lumbar spine - Status post laminectomy chronic pain  History of pacemaker  History of hypertension  History of asthma  History of breast cancer - 2005, lumpectomy, chemotherapy, radiation therapy, lymph node resection  Brachial neuritis  History of anxiety  History of gastroesophageal reflux (GERD)  Former smoker    Orders: Orders Placed This Encounter  Procedures  . CBC with Differential/Platelet  . COMPLETE METABOLIC PANEL WITH GFR   No orders of the defined types were placed in this encounter.   Face-to-face time spent with patient was 30 minutes. 50% of time was spent in counseling and coordination of care.  Follow-Up Instructions: Return in about 5 months (around 06/30/2017) for Rheumatoid arthritis.   Bo Merino, MD  Note - This record has been created using Editor, commissioning.  Chart creation errors have been sought, but may not always  have been located. Such creation errors do not reflect on  the standard of medical care.

## 2017-01-25 DIAGNOSIS — Z87891 Personal history of nicotine dependence: Secondary | ICD-10-CM | POA: Insufficient documentation

## 2017-01-28 ENCOUNTER — Ambulatory Visit (INDEPENDENT_AMBULATORY_CARE_PROVIDER_SITE_OTHER): Payer: Medicare Other | Admitting: Rheumatology

## 2017-01-28 ENCOUNTER — Encounter: Payer: Self-pay | Admitting: Rheumatology

## 2017-01-28 VITALS — BP 122/60 | HR 68 | Resp 14 | Ht 62.0 in | Wt 139.0 lb

## 2017-01-28 DIAGNOSIS — M5030 Other cervical disc degeneration, unspecified cervical region: Secondary | ICD-10-CM

## 2017-01-28 DIAGNOSIS — M19071 Primary osteoarthritis, right ankle and foot: Secondary | ICD-10-CM

## 2017-01-28 DIAGNOSIS — M19041 Primary osteoarthritis, right hand: Secondary | ICD-10-CM

## 2017-01-28 DIAGNOSIS — Z8679 Personal history of other diseases of the circulatory system: Secondary | ICD-10-CM

## 2017-01-28 DIAGNOSIS — Z853 Personal history of malignant neoplasm of breast: Secondary | ICD-10-CM

## 2017-01-28 DIAGNOSIS — M5412 Radiculopathy, cervical region: Secondary | ICD-10-CM

## 2017-01-28 DIAGNOSIS — Z87891 Personal history of nicotine dependence: Secondary | ICD-10-CM

## 2017-01-28 DIAGNOSIS — Z79899 Other long term (current) drug therapy: Secondary | ICD-10-CM | POA: Diagnosis not present

## 2017-01-28 DIAGNOSIS — Z950 Presence of cardiac pacemaker: Secondary | ICD-10-CM

## 2017-01-28 DIAGNOSIS — Z8709 Personal history of other diseases of the respiratory system: Secondary | ICD-10-CM | POA: Diagnosis not present

## 2017-01-28 DIAGNOSIS — M0579 Rheumatoid arthritis with rheumatoid factor of multiple sites without organ or systems involvement: Secondary | ICD-10-CM

## 2017-01-28 DIAGNOSIS — M47816 Spondylosis without myelopathy or radiculopathy, lumbar region: Secondary | ICD-10-CM

## 2017-01-28 DIAGNOSIS — Z8659 Personal history of other mental and behavioral disorders: Secondary | ICD-10-CM | POA: Diagnosis not present

## 2017-01-28 DIAGNOSIS — M19042 Primary osteoarthritis, left hand: Secondary | ICD-10-CM

## 2017-01-28 DIAGNOSIS — M19072 Primary osteoarthritis, left ankle and foot: Secondary | ICD-10-CM

## 2017-01-28 DIAGNOSIS — Z8719 Personal history of other diseases of the digestive system: Secondary | ICD-10-CM

## 2017-01-28 DIAGNOSIS — M47812 Spondylosis without myelopathy or radiculopathy, cervical region: Secondary | ICD-10-CM

## 2017-01-28 NOTE — Progress Notes (Signed)
Rheumatology Medication Review by a Pharmacist Does the patient feel that his/her medications are working for him/her?  Yes Has the patient been experiencing any side effects to the medications prescribed?  No Does the patient have any problems obtaining medications?  No  Issues to address at subsequent visits: None   Pharmacist comments:  [name] is a pleasant 71 yo F who presents for follow up of her rheumatoid arthritis.  She is currently taking hydroxychloroquine 200 mg BID Monday through Friday, methotrexate 6 tablets weekly, and folic acid 1 mg daily.  Patient had standing labs on 11/26/16.  CMP and CBC were normal.  Patient will be due for labs in April 2018.  Most recent hydroxychloroquine eye exam on 01/30/16.  She is due for hydroxychloroquine eye exam at this time.  Patient confirms she hydroxychloroquine eye exam scheduled for 02/10/2017.  Provided patient with hydroxychloroquine eye exam form.  Patient denies any questions or concerns regarding her medications at this time.    Elisabeth Most, Pharm.D., BCPS, CPP Clinical Pharmacist Pager: [phone] Phone: [phone] 01/28/2017 11:05 AM

## 2017-01-28 NOTE — Patient Instructions (Signed)
Standing Labs We placed an order today for your standing lab work.    Please come back and get your standing labs in April 2018 and every 3 months.    We have open lab Monday through Friday from 8:30-11:30 AM and 1:30-4 PM at the office of Dr. Tresa Moore, PA.   The office is located at [address], [place], [place], [place] [address] No appointment is necessary.   Labs are drawn by [facility].  You may receive a bill from [facility] for your lab work.

## 2017-01-30 ENCOUNTER — Encounter: Payer: Self-pay | Admitting: Cardiology

## 2017-02-04 ENCOUNTER — Other Ambulatory Visit: Payer: Self-pay | Admitting: *Deleted

## 2017-02-04 MED ORDER — HYDROXYCHLOROQUINE SULFATE 200 MG PO TABS: ORAL_TABLET | ORAL | 0 refills | Status: DC

## 2017-02-04 NOTE — Telephone Encounter (Signed)
ok 

## 2017-02-04 NOTE — Telephone Encounter (Signed)
Refill request received via fax  Last Visit: 01/28/17 Next Visit: 07/02/17 Labs: 11/26/16 PLQ Eye Exam: 01/30/16 WNL eye exam scheduled for 02/10/2017  Okay to refill PLQ?

## 2017-03-20 ENCOUNTER — Other Ambulatory Visit: Payer: Self-pay | Admitting: Rheumatology

## 2017-03-20 NOTE — Telephone Encounter (Addendum)
Last Visit: 01/28/17 Next Visit: 07/02/17 Labs: 11/26/16  Left message to remind patient she is due to update labs. Advise patient to call back with plan.   Okay to refill 30 supply MTX?

## 2017-03-20 NOTE — Telephone Encounter (Signed)
Patient will come in the first on next week to update labs.

## 2017-03-20 NOTE — Telephone Encounter (Signed)
ok 

## 2017-03-24 ENCOUNTER — Other Ambulatory Visit: Payer: Self-pay

## 2017-03-24 DIAGNOSIS — Z79899 Other long term (current) drug therapy: Secondary | ICD-10-CM

## 2017-03-24 LAB — CBC WITH DIFFERENTIAL/PLATELET
Basophils Absolute: 104 cells/uL (ref 0–200)
Basophils Relative: 2 %
Eosinophils Absolute: 312 cells/uL (ref 15–500)
Eosinophils Relative: 6 %
HEMATOCRIT: 41.6 % (ref 35.0–45.0)
Hemoglobin: 13.6 g/dL (ref 11.7–15.5)
LYMPHS PCT: 19 %
Lymphs Abs: 988 cells/uL (ref 850–3900)
MCH: 30.8 pg (ref 27.0–33.0)
MCHC: 32.7 g/dL (ref 32.0–36.0)
MCV: 94.3 fL (ref 80.0–100.0)
MONO ABS: 884 {cells}/uL (ref 200–950)
MPV: 9.6 fL (ref 7.5–12.5)
Monocytes Relative: 17 %
NEUTROS PCT: 56 %
Neutro Abs: 2912 cells/uL (ref 1500–7800)
Platelets: 240 10*3/uL (ref 140–400)
RBC: 4.41 MIL/uL (ref 3.80–5.10)
RDW: 14.9 % (ref 11.0–15.0)
WBC: 5.2 10*3/uL (ref 3.8–10.8)

## 2017-03-25 LAB — COMPLETE METABOLIC PANEL WITH GFR
ALT: 22 U/L (ref 6–29)
AST: 32 U/L (ref 10–35)
Albumin: 3.5 g/dL — ABNORMAL LOW (ref 3.6–5.1)
Alkaline Phosphatase: 58 U/L (ref 33–130)
BUN: 16 mg/dL (ref 7–25)
CALCIUM: 8.9 mg/dL (ref 8.6–10.4)
CHLORIDE: 102 mmol/L (ref 98–110)
CO2: 29 mmol/L (ref 20–31)
CREATININE: 0.68 mg/dL (ref 0.60–0.93)
GFR, Est Non African American: 87 mL/min (ref >=60)
Glucose, Bld: 77 mg/dL (ref 65–99)
POTASSIUM: 4.5 mmol/L (ref 3.5–5.3)
Sodium: 139 mmol/L (ref 135–146)
Total Bilirubin: 0.4 mg/dL (ref 0.2–1.2)
Total Protein: 5.9 g/dL — ABNORMAL LOW (ref 6.1–8.1)

## 2017-03-25 NOTE — Progress Notes (Signed)
Stable

## 2017-04-10 ENCOUNTER — Other Ambulatory Visit: Payer: Self-pay | Admitting: Rheumatology

## 2017-04-14 ENCOUNTER — Other Ambulatory Visit: Payer: Self-pay | Admitting: Rheumatology

## 2017-04-14 NOTE — Progress Notes (Signed)
Cardiology Office Note    Date:  04/15/2017   ID:  Dillon, Mcreynolds [birth year], MRN [id number]  PCP:  Darcus Austin, MD  Cardiologist: Sinclair Grooms, MD   Chief Complaint  Patient presents with  . Follow-up    6 sinus syndrome  . Coronary Artery Disease    History of Present Illness:  [name] is a 71 y.o. female with a history of breast Ca, HTN, RA, depression and recently diagnosed CAD s/p thrombotic occlusion of the LAD s/p thrombectomy (07/04/16), VT and CHB s/p PPM .  She is asymptomatic. She has not had syncope. She denies palpitations. No recurrence of chest pain. She has occasional shortness of breath but reminds me that she has asthma. She denies orthopnea and lower extremity swelling. No medication side effects.   Past Medical History:  Diagnosis Date  . Anxiety   . Asthma   . Breast cancer ([place])    left breast  . Depression   . Habitual alcohol use   . Hypertension   . RA (rheumatoid arthritis) ([place])   . Spinal stenosis     Past Surgical History:  Procedure Laterality Date  . BREAST LUMPECTOMY  2005   left  . CARDIAC CATHETERIZATION N/A 07/04/2016   Procedure: Left Heart Cath and Coronary Angiography;  Surgeon: Belva Crome, MD;  Location: [facility];  Service: Cardiovascular;  Laterality: N/A;  . CARDIAC CATHETERIZATION N/A 07/04/2016   Procedure: Temporary Pacemaker;  Surgeon: Belva Crome, MD;  Location: [facility];  Service: Cardiovascular;  Laterality: N/A;  . EP IMPLANTABLE DEVICE N/A 07/05/2016   Procedure: Pacemaker Implant;  Surgeon: Evans Lance, MD;  Location: [facility];  Service: Cardiovascular;  Laterality: N/A;  . [place]  . LYMPHECTOMY  2005    Current Medications: Outpatient Medications Prior to Visit  Medication Sig Dispense Refill  . acetaminophen (TYLENOL) 325 MG tablet Take 2 tablets (650 mg total) by mouth every 4 (four) hours as needed for headache or mild pain.    Marland Kitchen albuterol  (PROVENTIL HFA;VENTOLIN HFA) 108 (90 Base) MCG/ACT inhaler Inhale into the lungs every 6 (six) hours as needed for wheezing or shortness of breath.    Marland Kitchen aspirin 81 MG tablet Take 81 mg by mouth daily.      Marland Kitchen atorvastatin (LIPITOR) 40 MG tablet Take 1 tablet (40 mg total) by mouth daily at 6 PM. 90 tablet 3  . budesonide-formoterol (SYMBICORT) 160-4.5 MCG/ACT inhaler Inhale 2 puffs into the lungs 2 (two) times daily.    . calcium carbonate (OS-CAL) 600 MG TABS Take 600 mg by mouth 2 (two) times daily with a meal.      . carvedilol (COREG) 3.125 MG tablet Take 3.125 mg by mouth 2 (two) times daily with a meal.     . citalopram (CELEXA) 40 MG tablet Take 40 mg by mouth daily.      . clonazePAM (KLONOPIN) 0.5 MG tablet Take 0.5 mg by mouth every 12 (twelve) hours as needed for anxiety.  0  . folic acid (FOLVITE) 1 MG tablet Take 1 mg by mouth daily.     . hydroxychloroquine (PLAQUENIL) 200 MG tablet TAKE 1 TABLET BY MOUTH TWICE DAILY ON MONDAY THROUGH FRIDAY 120 tablet 0  . losartan (COZAAR) 25 MG tablet Take 25 mg by mouth daily.     . methotrexate (RHEUMATREX) 2.5 MG tablet TAKE 6 TABLETS BY MOUTH 1 TIME WEEKLY 24 tablet  0  . nitroGLYCERIN (NITROSTAT) 0.4 MG SL tablet Place 1 tablet (0.4 mg total) under the tongue every 5 (five) minutes as needed for chest pain. 25 tablet 2  . pantoprazole (PROTONIX) 40 MG tablet Take 1 tablet (40 mg total) by mouth daily. 30 tablet 8  . carvedilol (COREG) 3.125 MG tablet Take 1 tablet (3.125 mg total) by mouth 2 (two) times daily. 180 tablet 3  . cholecalciferol (VITAMIN D) 1000 UNITS tablet Take 1,000 Units by mouth daily.      Marland Kitchen losartan (COZAAR) 25 MG tablet Take 1 tablet (25 mg total) by mouth daily. 90 tablet 3   No facility-administered medications prior to visit.      Allergies:   Betadine [povidone iodine] and Hydrocodone   Social History   Social History  . Marital status: Married    Spouse name: N/A  . Number of children: N/A  . Years of  education: N/A   Social History Main Topics  . Smoking status: Former Research scientist (life sciences)  . Smokeless tobacco: Never Used     Comment: quit 1995  . Alcohol use 12.6 oz/week    21 Cans of beer per week     Comment: 3 DAILY  . Drug use: No  . Sexual activity: Not Asked   Other Topics Concern  . None   Social History Narrative  . None     Family History:  The patient's family history includes Cancer in her father, paternal aunt, and paternal uncle; Stroke in her mother.   ROS:   Please see the history of present illness.    Wheezing. Easy bruising.  All other systems reviewed and are negative.   PHYSICAL EXAM:   VS:  BP (!) 106/52 (BP Location: Right Arm)   Pulse 67   Ht 5\' 2"  (1.575 m)   Wt 135 lb 3.2 oz (61.3 kg)   BMI 24.73 kg/m    GEN: Well nourished, well developed, in no acute distress  HEENT: normal  Neck: no JVD, carotid bruits, or masses Cardiac: RRR; no murmurs, rubs, or gallops,no edema  Respiratory:  clear to auscultation bilaterally, normal work of breathing GI: soft, nontender, nondistended, + BS MS: no deformity or atrophy  Skin: warm and dry, no rash Neuro:  Alert and Oriented x 3, Strength and sensation are intact Psych: euthymic mood, full affect  Wt Readings from Last 3 Encounters:  04/15/17 135 lb 3.2 oz (61.3 kg)  01/28/17 139 lb (63 kg)  12/31/16 135 lb 9.3 oz (61.5 kg)      Studies/Labs Reviewed:   EKG:  EKG  Not repeated  Recent Labs: 03/24/2017: ALT 22; BUN 16; Creat 0.68; Hemoglobin 13.6; Platelets 240; Potassium 4.5; Sodium 139   Lipid Panel    Component Value Date/Time   CHOL 166 07/05/2016 0313   TRIG 88 07/05/2016 0313   HDL 54 07/05/2016 0313   CHOLHDL 3.1 07/05/2016 0313   VLDL 18 07/05/2016 0313   LDLCALC 94 07/05/2016 0313    Additional studies/ records that were reviewed today include:    Cardiac cath and temporary pacemaker report, 07/04/16:  High-grade AV block with syncope and polymorphic ventricular tachycardia  (nonsustained - possibly bradycardia dependent). Heavy mitral annular calcification suggests that AV block is chronic and degenerative in nature. The patient will likely require a permanent pacemaker.  Temporary pacemaker implantation via right femoral approach.  Total occlusion of the mid LAD likely related to a thromboembolic event complicating catheterization. Onset of chest discomfort was temporarily related  to the first left coronary angiogram.  Successful catheter-based thrombectomy of the mid LAD reducing 100% stenosis to less than 10% with TIMI grade 3 flow. Stenting was not required.  Otherwise widely patent/normal coronary arteries.  Mid anterior wall hypokinesis with EF 40-50%. (Interestingly, the ventriculogram was performed prior to left coronary angiography).   ASSESSMENT:    1. CAD- S/P LAD thrombectomy 07/04/16   2. Cardiomyopathy, ischemic   3. LBBB (left bundle branch block)   4. Essential hypertension   5. Complete heart block ([place])   6. Former smoker   7. History of rheumatoid arthritis   8. Systolic congestive heart failure, unspecified HF chronicity ([place])      PLAN:  In order of problems listed above:  1. Coronary embolus during heart catheterization treated with thrombectomy. No angina or recurrence of anginal symptoms. 2. Documentation of improved LV function based upon echo performed in November 2017. EF 45-50%. 3. Now has permanent pacemaker. 4. Excellent blood pressure control. 5. Normally functioning permanent pacemaker for complete heart block 6. No evidence of volume overload. Echocardiogram is being ordered to reassess LV function. If/when we get into the normal range we may consider further simplifying, perhaps discontinuing beta blocker therapy.  Clinical follow-up in one year.  Medication Adjustments/Labs and Tests Ordered: Current medicines are reviewed at length with the patient today.  Concerns regarding medicines are outlined above.   Medication changes, Labs and Tests ordered today are listed in the Patient Instructions below. Patient Instructions  Medication Instructions:  None  Labwork: None  Testing/Procedures: Your physician has requested that you have an echocardiogram. Echocardiography is a painless test that uses sound waves to create images of your heart. It provides your doctor with information about the size and shape of your heart and how well your heart's chambers and valves are working. This procedure takes approximately one hour. There are no restrictions for this procedure.   Follow-Up: Your physician wants you to follow-up in: 1 year with Dr. Tamala Julian.  You will receive a reminder letter in the mail two months in advance. If you don't receive a letter, please call our office to schedule the follow-up appointment.   Any Other Special Instructions Will Be Listed Below (If Applicable).     If you need a refill on your cardiac medications before your next appointment, please call your pharmacy.      Signed, Sinclair Grooms, MD  04/15/2017 10:55 AM    [facility] [place], [place], [place]  [address] Phone: [phone]; Fax: [phone]

## 2017-04-15 ENCOUNTER — Encounter: Payer: Self-pay | Admitting: Interventional Cardiology

## 2017-04-15 ENCOUNTER — Ambulatory Visit (INDEPENDENT_AMBULATORY_CARE_PROVIDER_SITE_OTHER): Payer: Medicare Other | Admitting: Interventional Cardiology

## 2017-04-15 VITALS — BP 106/52 | HR 67 | Ht 62.0 in | Wt 135.2 lb

## 2017-04-15 DIAGNOSIS — I442 Atrioventricular block, complete: Secondary | ICD-10-CM

## 2017-04-15 DIAGNOSIS — Z9861 Coronary angioplasty status: Secondary | ICD-10-CM

## 2017-04-15 DIAGNOSIS — I255 Ischemic cardiomyopathy: Secondary | ICD-10-CM

## 2017-04-15 DIAGNOSIS — I5020 Unspecified systolic (congestive) heart failure: Secondary | ICD-10-CM

## 2017-04-15 DIAGNOSIS — I447 Left bundle-branch block, unspecified: Secondary | ICD-10-CM

## 2017-04-15 DIAGNOSIS — I2510 Atherosclerotic heart disease of native coronary artery without angina pectoris: Secondary | ICD-10-CM

## 2017-04-15 DIAGNOSIS — I10 Essential (primary) hypertension: Secondary | ICD-10-CM | POA: Diagnosis not present

## 2017-04-15 NOTE — Telephone Encounter (Signed)
Last Visit: 01/28/17 Next Visit: 07/02/17 Labs: 03/24/17 Stable  Okay to refill MTX?

## 2017-04-15 NOTE — Patient Instructions (Signed)
Medication Instructions:  None  Labwork: None  Testing/Procedures: Your physician has requested that you have an echocardiogram. Echocardiography is a painless test that uses sound waves to create images of your heart. It provides your doctor with information about the size and shape of your heart and how well your heart's chambers and valves are working. This procedure takes approximately one hour. There are no restrictions for this procedure.   Follow-Up: Your physician wants you to follow-up in: 1 year with Dr. Smith.  You will receive a reminder letter in the mail two months in advance. If you don't receive a letter, please call our office to schedule the follow-up appointment.   Any Other Special Instructions Will Be Listed Below (If Applicable).     If you need a refill on your cardiac medications before your next appointment, please call your pharmacy.   

## 2017-04-15 NOTE — Telephone Encounter (Signed)
ok 

## 2017-04-16 ENCOUNTER — Encounter: Payer: Medicare Other | Admitting: *Deleted

## 2017-04-18 ENCOUNTER — Encounter: Payer: Self-pay | Admitting: Cardiology

## 2017-04-25 ENCOUNTER — Other Ambulatory Visit: Payer: Self-pay

## 2017-04-25 ENCOUNTER — Ambulatory Visit (HOSPITAL_COMMUNITY): Payer: Medicare Other | Attending: Cardiovascular Disease

## 2017-04-25 ENCOUNTER — Ambulatory Visit (INDEPENDENT_AMBULATORY_CARE_PROVIDER_SITE_OTHER): Payer: Medicare Other | Admitting: *Deleted

## 2017-04-25 DIAGNOSIS — Z950 Presence of cardiac pacemaker: Secondary | ICD-10-CM | POA: Insufficient documentation

## 2017-04-25 DIAGNOSIS — I517 Cardiomegaly: Secondary | ICD-10-CM | POA: Insufficient documentation

## 2017-04-25 DIAGNOSIS — I5020 Unspecified systolic (congestive) heart failure: Secondary | ICD-10-CM

## 2017-04-25 DIAGNOSIS — I348 Other nonrheumatic mitral valve disorders: Secondary | ICD-10-CM | POA: Diagnosis not present

## 2017-04-25 DIAGNOSIS — I442 Atrioventricular block, complete: Secondary | ICD-10-CM | POA: Diagnosis not present

## 2017-04-25 DIAGNOSIS — I5022 Chronic systolic (congestive) heart failure: Secondary | ICD-10-CM | POA: Diagnosis not present

## 2017-04-28 NOTE — Progress Notes (Signed)
Remote pacemaker transmission.   

## 2017-04-29 ENCOUNTER — Encounter: Payer: Self-pay | Admitting: Cardiology

## 2017-04-30 LAB — CUP PACEART REMOTE DEVICE CHECK
Brady Statistic AP VP Percent: 0.19 %
Brady Statistic AP VS Percent: 0.01 %
Brady Statistic RV Percent Paced: 99.71 %
Date Time Interrogation Session: 20180608060744
Implantable Lead Implant Date: 20170818
Implantable Lead Implant Date: 20170818
Implantable Lead Location: 753858
Implantable Lead Location: 753859
Implantable Lead Model: 5076
Lead Channel Impedance Value: 399 Ohm
Lead Channel Impedance Value: 513 Ohm
Lead Channel Impedance Value: 551 Ohm
Lead Channel Impedance Value: 551 Ohm
Lead Channel Impedance Value: 646 Ohm
Lead Channel Pacing Threshold Amplitude: 0.5 V
Lead Channel Pacing Threshold Amplitude: 0.5 V
Lead Channel Sensing Intrinsic Amplitude: 3.125 mV
Lead Channel Sensing Intrinsic Amplitude: 7.75 mV
Lead Channel Sensing Intrinsic Amplitude: 7.75 mV
Lead Channel Setting Pacing Amplitude: 2 V
Lead Channel Setting Pacing Pulse Width: 0.4 ms
Lead Channel Setting Sensing Sensitivity: 0.9 mV
MDC IDC LEAD IMPLANT DT: 20170818
MDC IDC LEAD LOCATION: 753860
MDC IDC MSMT BATTERY REMAINING LONGEVITY: 84 mo
MDC IDC MSMT BATTERY VOLTAGE: 3.02 V
MDC IDC MSMT LEADCHNL LV IMPEDANCE VALUE: 627 Ohm
MDC IDC MSMT LEADCHNL LV IMPEDANCE VALUE: 836 Ohm
MDC IDC MSMT LEADCHNL LV PACING THRESHOLD PULSEWIDTH: 0.4 ms
MDC IDC MSMT LEADCHNL RA IMPEDANCE VALUE: 342 Ohm
MDC IDC MSMT LEADCHNL RA IMPEDANCE VALUE: 475 Ohm
MDC IDC MSMT LEADCHNL RA PACING THRESHOLD PULSEWIDTH: 0.4 ms
MDC IDC MSMT LEADCHNL RA SENSING INTR AMPL: 3.125 mV
MDC IDC MSMT LEADCHNL RV PACING THRESHOLD AMPLITUDE: 0.5 V
MDC IDC MSMT LEADCHNL RV PACING THRESHOLD PULSEWIDTH: 0.4 ms
MDC IDC PG IMPLANT DT: 20170818
MDC IDC SET LEADCHNL LV PACING AMPLITUDE: 1.5 V
MDC IDC SET LEADCHNL RA PACING AMPLITUDE: 1.5 V
MDC IDC SET LEADCHNL RV PACING PULSEWIDTH: 0.4 ms
MDC IDC STAT BRADY AS VP PERCENT: 99.78 %
MDC IDC STAT BRADY AS VS PERCENT: 0.02 %
MDC IDC STAT BRADY RA PERCENT PACED: 0.2 %

## 2017-05-15 ENCOUNTER — Encounter: Payer: Self-pay | Admitting: Cardiology

## 2017-05-16 ENCOUNTER — Other Ambulatory Visit: Payer: Self-pay | Admitting: Rheumatology

## 2017-05-19 NOTE — Telephone Encounter (Signed)
Last Visit: 01/28/17 Next Visit: 07/02/17 Labs: 03/24/17 Stable PLQ Eye Exam: 02/10/17 WNL  Okay to refill per Dr. Estanislado Pandy

## 2017-06-25 DIAGNOSIS — Z8709 Personal history of other diseases of the respiratory system: Secondary | ICD-10-CM | POA: Insufficient documentation

## 2017-06-25 DIAGNOSIS — Z9889 Other specified postprocedural states: Secondary | ICD-10-CM | POA: Insufficient documentation

## 2017-06-25 DIAGNOSIS — Z8659 Personal history of other mental and behavioral disorders: Secondary | ICD-10-CM | POA: Insufficient documentation

## 2017-06-25 DIAGNOSIS — Z8719 Personal history of other diseases of the digestive system: Secondary | ICD-10-CM | POA: Insufficient documentation

## 2017-06-25 NOTE — Progress Notes (Signed)
Office Visit Note  Patient: [name]             Date of Birth: January 16, 1946           MRN: [id number]             PCP: Darcus Austin, MD Referring: Darcus Austin, MD Visit Date: 07/02/2017 Occupation: @GUAROCC @    Subjective:  Hand pain.   History of Present Illness: [name] is a 71 y.o. female with history of sero positive rheumatoid arthritis and osteoarthritis. She states recently she's been having increased discomfort in her hands. She denies any joint swelling. She does have underlying osteoarthritis in her other joints which are not as painful. She continues to have discomfort in her neck and lower back due to disc disease.  Activities of Daily Living:  Patient reports morning stiffness for 5 minutes.   Patient Denies nocturnal pain.  Difficulty dressing/grooming: Denies Difficulty climbing stairs: Denies Difficulty getting out of chair: Denies Difficulty using hands for taps, buttons, cutlery, and/or writing: Denies   Review of Systems  Constitutional: Positive for fatigue. Negative for night sweats, weight gain, weight loss and weakness.  HENT: Positive for mouth dryness. Negative for mouth sores, trouble swallowing, trouble swallowing and nose dryness.   Eyes: Negative for pain, redness, visual disturbance and dryness.  Respiratory: Positive for shortness of breath. Negative for cough and difficulty breathing.   Cardiovascular: Positive for hypertension. Negative for chest pain, palpitations, irregular heartbeat and swelling in legs/feet.       Pacemaker  Gastrointestinal: Negative for blood in stool, constipation and diarrhea.  Endocrine: Negative for increased urination.  Genitourinary: Negative for vaginal dryness.  Musculoskeletal: Positive for arthralgias, joint pain and morning stiffness. Negative for joint swelling, myalgias, muscle weakness, muscle tenderness and myalgias.  Skin: Negative for color change, rash, hair loss, skin tightness,  ulcers and sensitivity to sunlight.  Allergic/Immunologic: Negative for susceptible to infections.  Neurological: Negative for dizziness, memory loss and night sweats.  Hematological: Negative for swollen glands.  Psychiatric/Behavioral: Negative for depressed mood and sleep disturbance. The patient is nervous/anxious.     PMFS History:  Patient Active Problem List   Diagnosis Date Noted  . History of asthma 06/25/2017  . History of gastroesophageal reflux (GERD) 06/25/2017  . History of anxiety 06/25/2017  . History of bilateral carpal tunnel release 06/25/2017  . Former smoker 01/25/2017  . Rheumatoid arthritis involving multiple sites with positive rheumatoid factor ([place]) 01/17/2017  . High risk medication use 01/17/2017  . Primary osteoarthritis of both hands 01/17/2017  . Primary osteoarthritis of both feet 01/17/2017  . DJD (degenerative joint disease), cervical 01/17/2017  . Spondylosis of lumbar region without myelopathy or radiculopathy 01/17/2017  . Syncope and collapse 07/07/2016  . Cardiomyopathy, ischemic 07/07/2016  . CAD- S/P LAD thrombectomy 07/04/16 07/07/2016  . Pacemaker-MDT BiV placed 07/05/16 07/07/2016  . History of rheumatoid arthritis 07/07/2016  . Complete heart block ([place]) 07/04/2016  . Essential hypertension 07/04/2016  . LBBB (left bundle branch block) 07/04/2016  . Ventricular tachycardia ([place]) 07/04/2016  . Faintness 07/04/2016  . NSTEMI (non-ST elevated myocardial infarction) ([place]) 07/04/2016  . Exostosis 06/18/2011  . History of breast cancer 02/16/2010  . Vitamin D deficiency 02/15/2010  . HYPERCHOLESTEROLEMIA 02/15/2010  . ANXIETY 02/15/2010  . Asthma 02/15/2010  . MIGRAINES, HX OF 02/15/2010  . MASTECTOMY, LEFT, HX OF 02/15/2010    Past Medical History:  Diagnosis Date  . Anxiety   . Asthma   . Breast  cancer ([place])    left breast  . Carpal tunnel syndrome   . Depression   . Habitual alcohol use   . Hypertension   . RA (rheumatoid  arthritis) ([place])   . Spinal stenosis     Family History  Problem Relation Age of Onset  . Stroke Mother   . Cancer Father        colon?  . Pyelonephritis Sister   . Cancer Paternal Aunt   . Cancer Paternal Uncle    Past Surgical History:  Procedure Laterality Date  . BREAST LUMPECTOMY  2005   left  . CARDIAC CATHETERIZATION N/A 07/04/2016   Procedure: Left Heart Cath and Coronary Angiography;  Surgeon: Belva Crome, MD;  Location: [facility];  Service: Cardiovascular;  Laterality: N/A;  . CARDIAC CATHETERIZATION N/A 07/04/2016   Procedure: Temporary Pacemaker;  Surgeon: Belva Crome, MD;  Location: [facility];  Service: Cardiovascular;  Laterality: N/A;  . EP IMPLANTABLE DEVICE N/A 07/05/2016   Procedure: Pacemaker Implant;  Surgeon: Evans Lance, MD;  Location: [facility];  Service: Cardiovascular;  Laterality: N/A;  . [place]  . LYMPHECTOMY  2005   Social History   Social History Narrative  . No narrative on file     Objective: Vital Signs: BP (!) 126/59 (BP Location: Right Arm, Patient Position: Sitting, Cuff Size: Normal)   Pulse 67   Ht 5\' 2"  (1.575 m)   Wt 138 lb (62.6 kg)   BMI 25.24 kg/m    Physical Exam  Constitutional: She is oriented to person, place, and time. She appears well-developed and well-nourished.  HENT:  Head: Normocephalic and atraumatic.  Eyes: Conjunctivae and EOM are normal.  Neck: Normal range of motion.  Cardiovascular: Normal rate, regular rhythm, normal heart sounds and intact distal pulses.   Pacemaker  Pulmonary/Chest: Effort normal and breath sounds normal.  Abdominal: Soft. Bowel sounds are normal.  Lymphadenopathy:    She has no cervical adenopathy.  Neurological: She is alert and oriented to person, place, and time.  Skin: Skin is warm and dry. Capillary refill takes less than 2 seconds.  Psychiatric: She has a normal mood and affect. Her behavior is normal.  Nursing note and vitals  reviewed.    Musculoskeletal Exam: She has limited painful range of motion of her C-spine and lumbar spine. Shoulder joints were good range of motion. Her right elbow joint has contracture. Wrist joints are good range of motion. She has subluxation and ulnar deviation of her MCP joints. She has DIP PIP thickening in her hands consistent with osteoarthritis. No synovitis was noted. Hip joints knee joints ankles MTPs PIPs with good range of motion with no synovitis.  CDAI Exam: CDAI Homunculus Exam:   Joint Counts:  CDAI Tender Joint count: 0 CDAI Swollen Joint count: 0  Global Assessments:  Patient Global Assessment: 7 Provider Global Assessment: 2  CDAI Calculated Score: 9    Investigation: No additional findings. CBC Latest Ref Rng & Units 03/24/2017 11/26/2016 09/21/2016  WBC 3.8 - 10.8 K/uL 5.2 5.5 5.1  Hemoglobin 11.7 - 15.5 g/dL 13.6 13.6 13.4  Hematocrit 35.0 - 45.0 % 41.6 42.0 40.3  Platelets 140 - 400 K/uL 240 242 248    CMP Latest Ref Rng & Units 03/24/2017 11/26/2016 09/21/2016  Glucose 65 - 99 mg/dL 77 82 94  BUN 7 - 25 mg/dL 16 17 11   Creatinine 0.60 - 0.93 mg/dL 0.68 0.69 0.72  Sodium 135 -  146 mmol/L 139 140 138  Potassium 3.5 - 5.3 mmol/L 4.5 4.4 4.0  Chloride 98 - 110 mmol/L 102 103 103  CO2 20 - 31 mmol/L 29 27 28   Calcium 8.6 - 10.4 mg/dL 8.9 8.8 9.3  Total Protein 6.1 - 8.1 g/dL 5.9(L) 5.9(L) -  Total Bilirubin 0.2 - 1.2 mg/dL 0.4 0.4 -  Alkaline Phos 33 - 130 U/L 58 50 -  AST 10 - 35 U/L 32 30 -  ALT 6 - 29 U/L 22 26 -    Imaging: No results found.  Speciality Comments: No specialty comments available.    Procedures:  No procedures performed Allergies: Betadine [povidone iodine] and Hydrocodone   Assessment / Plan:     Visit Diagnoses: Rheumatoid arthritis involving multiple sites with positive rheumatoid factor  - +RF, +anti-CCP, +ANA. Patient has synovial thickening but no synovitis on examination she is been experiencing increased pain recently. I  believe some of the discomfort is coming from underlying osteoarthritis.  High risk medication use - Plaquenil 200 mg po bid M-Fand Methotrexate 6 tabs po q week, Folic acid 1mg  po qd - Plan: CBC with Differential/Platelet, COMPLETE METABOLIC PANEL WITH GFR. Her labs have been stable we'll check her labs today and then every 3 months to monitor for drug toxicity.  Primary osteoarthritis of both hands: Joint protection and muscle strengthening was discussed.  History of bilateral carpal tunnel release: Doing well  Primary osteoarthritis of both feet: Proper fitting shoes discussed.  DDD (degenerative disc disease), cervical: She continues to have pain and stiffness  DDD (degenerative disc disease), lumbar s/p laminectomy 1960s: She is significant pain in her lower back.  Vitamin D deficiency: She is on supplements.  Former smoker  History of breast cancer - 2005, lumpectomy, chemotherapy, radiation therapy, lymph node resection  History of left bundle branch block  History of ventricular tachycardia/ History of Pacemaker placement   History of hypertension: Her blood pressure is well controlled.  History of asthma  History of gastroesophageal reflux (GERD)  History of anxiety  History of hyperlipidemia    Orders: No orders of the defined types were placed in this encounter.  Meds ordered this encounter  Medications  . methotrexate (RHEUMATREX) 2.5 MG tablet    Sig: Take 6 tablets (15 mg total) by mouth once a week. Caution:Chemotherapy. Protect from light.    Dispense:  72 tablet    Refill:  0    Face-to-face time spent with patient was 30 minutes. Greater than 50% of time was spent in counseling and coordination of care.  Follow-Up Instructions: Return in about 5 months (around 12/02/2017) for Rheumatoid arthritis, Osteoarthritis.   Bo Merino, MD  Note - This record has been created using Editor, commissioning.  Chart creation errors have been sought, but may  not always  have been located. Such creation errors do not reflect on  the standard of medical care.

## 2017-07-02 ENCOUNTER — Encounter: Payer: Self-pay | Admitting: Rheumatology

## 2017-07-02 ENCOUNTER — Ambulatory Visit (INDEPENDENT_AMBULATORY_CARE_PROVIDER_SITE_OTHER): Payer: Medicare Other | Admitting: Rheumatology

## 2017-07-02 VITALS — BP 126/59 | HR 67 | Ht 62.0 in | Wt 138.0 lb

## 2017-07-02 DIAGNOSIS — M0579 Rheumatoid arthritis with rheumatoid factor of multiple sites without organ or systems involvement: Secondary | ICD-10-CM | POA: Diagnosis not present

## 2017-07-02 DIAGNOSIS — Z8709 Personal history of other diseases of the respiratory system: Secondary | ICD-10-CM | POA: Diagnosis not present

## 2017-07-02 DIAGNOSIS — M19041 Primary osteoarthritis, right hand: Secondary | ICD-10-CM

## 2017-07-02 DIAGNOSIS — E559 Vitamin D deficiency, unspecified: Secondary | ICD-10-CM | POA: Diagnosis not present

## 2017-07-02 DIAGNOSIS — M5136 Other intervertebral disc degeneration, lumbar region: Secondary | ICD-10-CM | POA: Diagnosis not present

## 2017-07-02 DIAGNOSIS — M19042 Primary osteoarthritis, left hand: Secondary | ICD-10-CM

## 2017-07-02 DIAGNOSIS — Z853 Personal history of malignant neoplasm of breast: Secondary | ICD-10-CM | POA: Diagnosis not present

## 2017-07-02 DIAGNOSIS — M19071 Primary osteoarthritis, right ankle and foot: Secondary | ICD-10-CM

## 2017-07-02 DIAGNOSIS — Z8679 Personal history of other diseases of the circulatory system: Secondary | ICD-10-CM

## 2017-07-02 DIAGNOSIS — Z79899 Other long term (current) drug therapy: Secondary | ICD-10-CM

## 2017-07-02 DIAGNOSIS — M5030 Other cervical disc degeneration, unspecified cervical region: Secondary | ICD-10-CM

## 2017-07-02 DIAGNOSIS — Z8659 Personal history of other mental and behavioral disorders: Secondary | ICD-10-CM

## 2017-07-02 DIAGNOSIS — M19072 Primary osteoarthritis, left ankle and foot: Secondary | ICD-10-CM

## 2017-07-02 DIAGNOSIS — Z8639 Personal history of other endocrine, nutritional and metabolic disease: Secondary | ICD-10-CM

## 2017-07-02 DIAGNOSIS — Z8719 Personal history of other diseases of the digestive system: Secondary | ICD-10-CM

## 2017-07-02 DIAGNOSIS — Z9889 Other specified postprocedural states: Secondary | ICD-10-CM

## 2017-07-02 DIAGNOSIS — Z87891 Personal history of nicotine dependence: Secondary | ICD-10-CM

## 2017-07-02 LAB — CBC WITH DIFFERENTIAL/PLATELET
BASOS ABS: 68 {cells}/uL (ref 0–200)
BASOS PCT: 1 %
EOS ABS: 544 {cells}/uL — AB (ref 15–500)
Eosinophils Relative: 8 %
HEMATOCRIT: 42.3 % (ref 35.0–45.0)
HEMOGLOBIN: 13.9 g/dL (ref 11.7–15.5)
LYMPHS ABS: 1564 {cells}/uL (ref 850–3900)
Lymphocytes Relative: 23 %
MCH: 32 pg (ref 27.0–33.0)
MCHC: 32.9 g/dL (ref 32.0–36.0)
MCV: 97.2 fL (ref 80.0–100.0)
MONO ABS: 748 {cells}/uL (ref 200–950)
MPV: 9.2 fL (ref 7.5–12.5)
Monocytes Relative: 11 %
NEUTROS ABS: 3876 {cells}/uL (ref 1500–7800)
Neutrophils Relative %: 57 %
PLATELETS: 267 10*3/uL (ref 140–400)
RBC: 4.35 MIL/uL (ref 3.80–5.10)
RDW: 14.7 % (ref 11.0–15.0)
WBC: 6.8 10*3/uL (ref 3.8–10.8)

## 2017-07-02 MED ORDER — METHOTREXATE 2.5 MG PO TABS: 15.0000 mg | ORAL_TABLET | ORAL | 0 refills | Status: DC

## 2017-07-02 NOTE — Progress Notes (Signed)
Rheumatology Medication Review by a Pharmacist Does the patient feel that his/her medications are working for him/her?  Yes Has the patient been experiencing any side effects to the medications prescribed?  No Does the patient have any problems obtaining medications?  No  Issues to address at subsequent visits: None   Pharmacist comments:  [name] is a pleasant 71 yo F who presents for follow up of rheumatoid arthritis.  She is currently taking hydroxychloroquine 200 mg BID Monday through Friday, methotrexate 6 tablets weekly, and folic acid 1 mg daily.  Thomas recent standing labs were normal on 03/24/17.  She is due for standing labs today then every 3 months.  Thomas recent hydroxychloroquine eye exam was normal on 02/10/17.  Patient denies any questions or concerns regarding her medications at this time.   I did notice that patient's medication list included citalopram 20 and 40 mg tablets.  Patient is unsure which dose she is taking.  I reviewed with patient that current guidelines recommend avoiding citalopram doses > 20 mg in patients over 60 years of ago.  Patient reports she will review her prescription bottles when she gets home to confirm her dose and will discuss with her PCP if dose is 40 mg daily.    [name], Pharm.D., BCPS, CPP Clinical Pharmacist Pager: [phone] Phone: [phone] 07/02/2017 11:45 AM

## 2017-07-02 NOTE — Patient Instructions (Signed)
Standing Labs We placed an order today for your standing lab work.    Please come back and get your standing labs in November and every 3 months  We have open lab Monday through Friday from 8:30-11:30 AM and 1:30-4 PM at the office of Dr. Jamarious Febo.   The office is located at [address], [address], [place], [place] [address] No appointment is necessary.   Labs are drawn by [facility].  You may receive a bill from [facility] for your lab work. If you have any questions regarding directions or hours of operation,  please call [phone].    

## 2017-07-03 LAB — COMPLETE METABOLIC PANEL WITH GFR
ALBUMIN: 3.8 g/dL (ref 3.6–5.1)
ALK PHOS: 64 U/L (ref 33–130)
ALT: 24 U/L (ref 6–29)
AST: 32 U/L (ref 10–35)
BILIRUBIN TOTAL: 0.5 mg/dL (ref 0.2–1.2)
BUN: 14 mg/dL (ref 7–25)
CO2: 28 mmol/L (ref 20–32)
CREATININE: 0.61 mg/dL (ref 0.60–0.93)
Calcium: 9.2 mg/dL (ref 8.6–10.4)
Chloride: 101 mmol/L (ref 98–110)
GFR, Est Non African American: >89 mL/min (ref >=60)
Glucose, Bld: 90 mg/dL (ref 65–99)
Potassium: 4.4 mmol/L (ref 3.5–5.3)
Sodium: 139 mmol/L (ref 135–146)
TOTAL PROTEIN: 6.1 g/dL (ref 6.1–8.1)

## 2017-07-03 NOTE — Progress Notes (Signed)
Stable

## 2017-07-07 ENCOUNTER — Other Ambulatory Visit: Payer: Self-pay | Admitting: Physician Assistant

## 2017-07-15 ENCOUNTER — Encounter: Payer: Self-pay | Admitting: Internal Medicine

## 2017-07-15 ENCOUNTER — Other Ambulatory Visit: Payer: Self-pay | Admitting: Rheumatology

## 2017-07-15 ENCOUNTER — Ambulatory Visit (INDEPENDENT_AMBULATORY_CARE_PROVIDER_SITE_OTHER): Payer: Medicare Other | Admitting: Internal Medicine

## 2017-07-15 VITALS — BP 132/64 | HR 71 | Ht 62.0 in | Wt 136.0 lb

## 2017-07-15 DIAGNOSIS — I442 Atrioventricular block, complete: Secondary | ICD-10-CM

## 2017-07-15 MED ORDER — ATORVASTATIN CALCIUM 40 MG PO TABS: 40.0000 mg | ORAL_TABLET | Freq: Every day | ORAL | 11 refills | Status: DC

## 2017-07-15 NOTE — Patient Instructions (Signed)
Medication Instructions:  Your physician recommends that you continue on your current medications as directed. Please refer to the Current Medication list given to you today.   Labwork: None ordered.   Testing/Procedures: None ordered.   Follow-Up: Your physician wants you to follow-up in: one year with Dr. Lovena Le.   You will receive a reminder letter in the mail two months in advance. If you don't receive a letter, please call our office to schedule the follow-up appointment.  Remote monitoring is used to monitor your Pacemaker from home. This monitoring reduces the number of office visits required to check your device to one time per year. It allows [place] to keep an eye on the functioning of your device to ensure it is working properly. You are scheduled for a device check from home on 07/28/2017. You may send your transmission at any time that day. If you have a wireless device, the transmission will be sent automatically. After your physician reviews your transmission, you will receive a postcard with your next transmission date.    Any Other Special Instructions Will Be Listed Below (If Applicable).     If you need a refill on your cardiac medications before your next appointment, please call your pharmacy.

## 2017-07-15 NOTE — Telephone Encounter (Addendum)
Last Visit: 07/02/17 Next Visit: 12/10/17 Labs: 07/02/17 Stable PLQ Eye Exam: 02/10/17 WNL    Okay to refill per Dr. Estanislado Pandy

## 2017-07-15 NOTE — Progress Notes (Signed)
HPI [name] returns today for followup of her chronic systolic heart failure, CHB, s/p BiV PPM insertion. The patient has done well since I saw her last. She has had no chest pain, and denies syncope or shortness of breath. She has been busy taking care of her husband who has had multiple medical problems. She denies peripheral edema.  Allergies  Allergen Reactions  . Betadine [Povidone Iodine] Shortness Of Breath  . Hydrocodone Itching     Current Outpatient Prescriptions  Medication Sig Dispense Refill  . acetaminophen (TYLENOL) 325 MG tablet Take 2 tablets (650 mg total) by mouth every 4 (four) hours as needed for headache or mild pain.    Marland Kitchen albuterol (PROVENTIL HFA;VENTOLIN HFA) 108 (90 Base) MCG/ACT inhaler Inhale into the lungs every 6 (six) hours as needed for wheezing or shortness of breath.    Marland Kitchen aspirin 81 MG tablet Take 81 mg by mouth daily.      Marland Kitchen atorvastatin (LIPITOR) 40 MG tablet Take 1 tablet (40 mg total) by mouth daily at 6 PM. 90 tablet 3  . budesonide-formoterol (SYMBICORT) 160-4.5 MCG/ACT inhaler Inhale 2 puffs into the lungs 2 (two) times daily.    . calcium carbonate (OS-CAL) 600 MG TABS Take 600 mg by mouth 2 (two) times daily with a meal.      . carvedilol (COREG) 3.125 MG tablet TAKE 1 TABLET(3.125 MG) BY MOUTH TWICE DAILY 180 tablet 0  . citalopram (CELEXA) 20 MG tablet Take 20 mg by mouth daily.  3  . clonazePAM (KLONOPIN) 0.5 MG tablet Take 0.5 mg by mouth every 12 (twelve) hours as needed for anxiety.  0  . Ergocalciferol (VITAMIN D2) 2000 units TABS Take 2,000 Units by mouth daily.    . folic acid (FOLVITE) 1 MG tablet Take 1 mg by mouth daily.     . hydroxychloroquine (PLAQUENIL) 200 MG tablet TAKE 1 TABLET BY MOUTH TWICE DAILY MONDAY THROUGH FRIDAY 120 tablet 0  . losartan (COZAAR) 25 MG tablet TAKE 1 TABLET(25 MG) BY MOUTH DAILY 90 tablet 0  . methotrexate (RHEUMATREX) 2.5 MG tablet Take 6 tablets (15 mg total) by mouth once a week.  Caution:Chemotherapy. Protect from light. 72 tablet 0  . nitroGLYCERIN (NITROSTAT) 0.4 MG SL tablet Place 1 tablet (0.4 mg total) under the tongue every 5 (five) minutes as needed for chest pain. 25 tablet 2  . pantoprazole (PROTONIX) 40 MG tablet Take 1 tablet (40 mg total) by mouth daily. 30 tablet 8   No current facility-administered medications for this visit.      Past Medical History:  Diagnosis Date  . Anxiety   . Asthma   . Breast cancer ([place])    left breast  . Carpal tunnel syndrome   . Depression   . Habitual alcohol use   . Hypertension   . RA (rheumatoid arthritis) ([place])   . Spinal stenosis     ROS:   All systems reviewed and negative except as noted in the HPI.   Past Surgical History:  Procedure Laterality Date  . BREAST LUMPECTOMY  2005   left  . CARDIAC CATHETERIZATION N/A 07/04/2016   Procedure: Left Heart Cath and Coronary Angiography;  Surgeon: Belva Crome, MD;  Location: [facility];  Service: Cardiovascular;  Laterality: N/A;  . CARDIAC CATHETERIZATION N/A 07/04/2016   Procedure: Temporary Pacemaker;  Surgeon: Belva Crome, MD;  Location: [facility];  Service: Cardiovascular;  Laterality: N/A;  . EP  IMPLANTABLE DEVICE N/A 07/05/2016   Procedure: Pacemaker Implant;  Surgeon: Evans Lance, MD;  Location: [facility];  Service: Cardiovascular;  Laterality: N/A;  . [place]  . LYMPHECTOMY  2005     Family History  Problem Relation Age of Onset  . Stroke Mother   . Cancer Father        colon?  . Pyelonephritis Sister   . Cancer Paternal Aunt   . Cancer Paternal Uncle      Social History   Social History  . Marital status: Married    Spouse name: N/A  . Number of children: N/A  . Years of education: N/A   Occupational History  . Not on file.   Social History Main Topics  . Smoking status: Former Research scientist (life sciences)  . Smokeless tobacco: Never Used     Comment: quit 1995  . Alcohol use 12.6 oz/week    21 Cans of  beer per week     Comment: 3 DAILY  . Drug use: No  . Sexual activity: Not on file   Other Topics Concern  . Not on file   Social History Narrative  . No narrative on file     BP 132/64   Pulse 71   Ht 5\' 2"  (1.575 m)   Wt 136 lb (61.7 kg)   SpO2 98%   BMI 24.87 kg/m   Physical Exam:  Well appearing 71 year old woman, NAD HEENT: Unremarkable Neck:  6 cm JVD, no thyromegally Lymphatics:  No adenopathy Back:  No CVA tenderness Lungs:  Clear, with no wheezes, rales, or rhonchi. HEART:  Regular rate rhythm, no murmurs, no rubs, no clicks Abd:  soft, positive bowel sounds, no organomegally, no rebound, no guarding Ext:  2 plus pulses, no edema, no cyanosis, no clubbing Skin:  No rashes no nodules Neuro:  CN II through XII intact, motor grossly intact  EKG - normal sinus rhythm with biventricular pacing  DEVICE  Normal device function.  See PaceArt for details.   Assess/Plan: 1. Complete heart block - she is doing well status post biventricular pacemaker insertion. 2. Biventricular pacemaker - her Medtronic biventricular pacemaker is working normally. We'll plan to recheck in several months. 3. Dyslipidemia - she will continue atorvastatin. She is encouraged to maintain a low-sodium low-fat diet.  Cristopher Peru, M.D.

## 2017-07-17 LAB — CUP PACEART INCLINIC DEVICE CHECK
Brady Statistic AP VS Percent: 0.01 %
Brady Statistic AS VP Percent: 99.84 %
Brady Statistic AS VS Percent: 0.03 %
Implantable Lead Implant Date: 20170818
Implantable Lead Location: 753860
Implantable Lead Model: 5076
Implantable Lead Model: 5076
Implantable Pulse Generator Implant Date: 20170818
Lead Channel Impedance Value: 361 Ohm
Lead Channel Impedance Value: 418 Ohm
Lead Channel Impedance Value: 532 Ohm
Lead Channel Impedance Value: 570 Ohm
Lead Channel Impedance Value: 608 Ohm
Lead Channel Impedance Value: 684 Ohm
Lead Channel Impedance Value: 874 Ohm
Lead Channel Pacing Threshold Amplitude: 0.5 V
Lead Channel Pacing Threshold Pulse Width: 0.4 ms
Lead Channel Pacing Threshold Pulse Width: 0.4 ms
Lead Channel Sensing Intrinsic Amplitude: 5.125 mV
Lead Channel Sensing Intrinsic Amplitude: 6.125 mV
Lead Channel Setting Pacing Amplitude: 1.75 V
Lead Channel Setting Pacing Amplitude: 2 V
Lead Channel Setting Pacing Pulse Width: 0.4 ms
Lead Channel Setting Pacing Pulse Width: 0.4 ms
MDC IDC LEAD IMPLANT DT: 20170818
MDC IDC LEAD IMPLANT DT: 20170818
MDC IDC LEAD LOCATION: 753858
MDC IDC LEAD LOCATION: 753859
MDC IDC MSMT BATTERY REMAINING LONGEVITY: 80 mo
MDC IDC MSMT BATTERY VOLTAGE: 3.02 V
MDC IDC MSMT LEADCHNL LV IMPEDANCE VALUE: 532 Ohm
MDC IDC MSMT LEADCHNL RA IMPEDANCE VALUE: 475 Ohm
MDC IDC MSMT LEADCHNL RA PACING THRESHOLD AMPLITUDE: 0.5 V
MDC IDC MSMT LEADCHNL RA SENSING INTR AMPL: 2.5 mV
MDC IDC MSMT LEADCHNL RA SENSING INTR AMPL: 3 mV
MDC IDC MSMT LEADCHNL RV PACING THRESHOLD AMPLITUDE: 0.5 V
MDC IDC MSMT LEADCHNL RV PACING THRESHOLD PULSEWIDTH: 0.4 ms
MDC IDC SESS DTM: 20180828201227
MDC IDC SET LEADCHNL RA PACING AMPLITUDE: 1.5 V
MDC IDC SET LEADCHNL RV SENSING SENSITIVITY: 0.9 mV
MDC IDC STAT BRADY AP VP PERCENT: 0.12 %
MDC IDC STAT BRADY RA PERCENT PACED: 0.13 %
MDC IDC STAT BRADY RV PERCENT PACED: 99.81 %

## 2017-07-28 ENCOUNTER — Telehealth: Payer: Self-pay | Admitting: Cardiology

## 2017-07-28 ENCOUNTER — Ambulatory Visit (INDEPENDENT_AMBULATORY_CARE_PROVIDER_SITE_OTHER): Payer: Medicare Other | Admitting: *Deleted

## 2017-07-28 DIAGNOSIS — I442 Atrioventricular block, complete: Secondary | ICD-10-CM

## 2017-07-28 DIAGNOSIS — I255 Ischemic cardiomyopathy: Secondary | ICD-10-CM

## 2017-07-28 NOTE — Progress Notes (Signed)
Remote pacemaker transmission.   

## 2017-07-28 NOTE — Telephone Encounter (Signed)
Spoke with pt and reminded pt of remote transmission that is due today. Pt verbalized understanding.   

## 2017-07-29 LAB — CUP PACEART REMOTE DEVICE CHECK
Battery Remaining Longevity: 80 mo
Battery Voltage: 3.02 V
Brady Statistic AP VP Percent: 0.11 %
Brady Statistic RA Percent Paced: 0.12 %
Brady Statistic RV Percent Paced: 99.79 %
Date Time Interrogation Session: 20180911011801
Implantable Lead Implant Date: 20170818
Implantable Lead Location: 753859
Implantable Lead Location: 753860
Implantable Lead Model: 4396
Implantable Lead Model: 5076
Implantable Pulse Generator Implant Date: 20170818
Lead Channel Impedance Value: 456 Ohm
Lead Channel Impedance Value: 532 Ohm
Lead Channel Impedance Value: 532 Ohm
Lead Channel Impedance Value: 836 Ohm
Lead Channel Pacing Threshold Amplitude: 0.375 V
Lead Channel Pacing Threshold Amplitude: 0.5 V
Lead Channel Sensing Intrinsic Amplitude: 2.25 mV
Lead Channel Sensing Intrinsic Amplitude: 2.25 mV
Lead Channel Setting Pacing Amplitude: 1.5 V
Lead Channel Setting Pacing Amplitude: 2 V
Lead Channel Setting Sensing Sensitivity: 0.9 mV
MDC IDC LEAD IMPLANT DT: 20170818
MDC IDC LEAD IMPLANT DT: 20170818
MDC IDC LEAD LOCATION: 753858
MDC IDC MSMT LEADCHNL LV IMPEDANCE VALUE: 418 Ohm
MDC IDC MSMT LEADCHNL LV IMPEDANCE VALUE: 646 Ohm
MDC IDC MSMT LEADCHNL LV PACING THRESHOLD AMPLITUDE: 0.5 V
MDC IDC MSMT LEADCHNL LV PACING THRESHOLD PULSEWIDTH: 0.4 ms
MDC IDC MSMT LEADCHNL RA IMPEDANCE VALUE: 342 Ohm
MDC IDC MSMT LEADCHNL RA PACING THRESHOLD PULSEWIDTH: 0.4 ms
MDC IDC MSMT LEADCHNL RV IMPEDANCE VALUE: 532 Ohm
MDC IDC MSMT LEADCHNL RV IMPEDANCE VALUE: 589 Ohm
MDC IDC MSMT LEADCHNL RV PACING THRESHOLD PULSEWIDTH: 0.4 ms
MDC IDC MSMT LEADCHNL RV SENSING INTR AMPL: 5.125 mV
MDC IDC MSMT LEADCHNL RV SENSING INTR AMPL: 6.125 mV
MDC IDC SET LEADCHNL LV PACING AMPLITUDE: 1.5 V
MDC IDC SET LEADCHNL LV PACING PULSEWIDTH: 0.4 ms
MDC IDC SET LEADCHNL RV PACING PULSEWIDTH: 0.4 ms
MDC IDC STAT BRADY AP VS PERCENT: 0.01 %
MDC IDC STAT BRADY AS VP PERCENT: 99.87 %
MDC IDC STAT BRADY AS VS PERCENT: 0.01 %

## 2017-07-30 ENCOUNTER — Encounter: Payer: Self-pay | Admitting: Cardiology

## 2017-08-25 ENCOUNTER — Other Ambulatory Visit: Payer: Self-pay | Admitting: Rheumatology

## 2017-08-26 NOTE — Telephone Encounter (Signed)
Last Visit: 07/02/17 Next Visit: 12/10/17  Okay to refill per Dr. Estanislado Pandy

## 2017-09-23 ENCOUNTER — Other Ambulatory Visit: Payer: Self-pay | Admitting: Family Medicine

## 2017-09-23 DIAGNOSIS — Z1231 Encounter for screening mammogram for malignant neoplasm of breast: Secondary | ICD-10-CM

## 2017-09-30 ENCOUNTER — Other Ambulatory Visit: Payer: Self-pay | Admitting: Rheumatology

## 2017-10-01 NOTE — Telephone Encounter (Addendum)
Last visit: 07/02/17 Next visit: 12/10/17 Labs: 07/02/17 stable   Left message to advise patient she is due for labs this month.   Okay to refill per Dr. Estanislado Pandy.

## 2017-10-03 ENCOUNTER — Other Ambulatory Visit: Payer: Self-pay

## 2017-10-03 DIAGNOSIS — Z79899 Other long term (current) drug therapy: Secondary | ICD-10-CM

## 2017-10-03 LAB — COMPLETE METABOLIC PANEL WITH GFR
AG Ratio: 1.8 (calc) (ref 1.0–2.5)
ALT: 27 U/L (ref 6–29)
AST: 34 U/L (ref 10–35)
Albumin: 4 g/dL (ref 3.6–5.1)
Alkaline phosphatase (APISO): 70 U/L (ref 33–130)
BUN: 13 mg/dL (ref 7–25)
CALCIUM: 9.4 mg/dL (ref 8.6–10.4)
CO2: 33 mmol/L — AB (ref 20–32)
CREATININE: 0.72 mg/dL (ref 0.60–0.93)
Chloride: 100 mmol/L (ref 98–110)
GFR, EST NON AFRICAN AMERICAN: 83 mL/min/{1.73_m2} (ref >=60)
GFR, Est African American: 96 mL/min/{1.73_m2} (ref >=60)
GLUCOSE: 93 mg/dL (ref 65–99)
Globulin: 2.2 g/dL (calc) (ref 1.9–3.7)
Potassium: 5 mmol/L (ref 3.5–5.3)
Sodium: 138 mmol/L (ref 135–146)
TOTAL PROTEIN: 6.2 g/dL (ref 6.1–8.1)
Total Bilirubin: 0.6 mg/dL (ref 0.2–1.2)

## 2017-10-03 LAB — CBC WITH DIFFERENTIAL/PLATELET
Basophils Absolute: 88 cells/uL (ref 0–200)
Basophils Relative: 1.3 %
EOS ABS: 496 {cells}/uL (ref 15–500)
Eosinophils Relative: 7.3 %
HEMATOCRIT: 38.7 % (ref 35.0–45.0)
HEMOGLOBIN: 13.3 g/dL (ref 11.7–15.5)
LYMPHS ABS: 1272 {cells}/uL (ref 850–3900)
MCH: 32.4 pg (ref 27.0–33.0)
MCHC: 34.4 g/dL (ref 32.0–36.0)
MCV: 94.4 fL (ref 80.0–100.0)
MPV: 9.5 fL (ref 7.5–12.5)
Monocytes Relative: 10.8 %
Neutro Abs: 4209 cells/uL (ref 1500–7800)
Neutrophils Relative %: 61.9 %
PLATELETS: 263 10*3/uL (ref 140–400)
RBC: 4.1 10*6/uL (ref 3.80–5.10)
RDW: 12.9 % (ref 11.0–15.0)
TOTAL LYMPHOCYTE: 18.7 %
WBC: 6.8 10*3/uL (ref 3.8–10.8)
WBCMIX: 734 {cells}/uL (ref 200–950)

## 2017-10-27 ENCOUNTER — Ambulatory Visit (INDEPENDENT_AMBULATORY_CARE_PROVIDER_SITE_OTHER): Payer: Medicare Other | Admitting: *Deleted

## 2017-10-27 DIAGNOSIS — I5020 Unspecified systolic (congestive) heart failure: Secondary | ICD-10-CM

## 2017-10-27 DIAGNOSIS — I255 Ischemic cardiomyopathy: Secondary | ICD-10-CM | POA: Diagnosis not present

## 2017-10-27 NOTE — Progress Notes (Signed)
Remote pacemaker transmission.   

## 2017-10-29 ENCOUNTER — Telehealth: Payer: Self-pay

## 2017-10-29 ENCOUNTER — Other Ambulatory Visit: Payer: Self-pay | Admitting: Interventional Cardiology

## 2017-10-29 NOTE — Telephone Encounter (Signed)
Patient would like a 30 day supply refill on PLQ sent to [facility]. CB# is [phone].  Please advise.  Thank you.

## 2017-10-30 LAB — CUP PACEART REMOTE DEVICE CHECK
Brady Statistic AP VS Percent: 0.01 %
Brady Statistic AS VP Percent: 99.87 %
Brady Statistic RA Percent Paced: 0.12 %
Implantable Lead Implant Date: 20170818
Implantable Lead Location: 753859
Implantable Lead Model: 5076
Lead Channel Impedance Value: 437 Ohm
Lead Channel Impedance Value: 513 Ohm
Lead Channel Impedance Value: 589 Ohm
Lead Channel Impedance Value: 646 Ohm
Lead Channel Impedance Value: 874 Ohm
Lead Channel Pacing Threshold Pulse Width: 0.4 ms
Lead Channel Pacing Threshold Pulse Width: 0.4 ms
Lead Channel Sensing Intrinsic Amplitude: 2.875 mV
Lead Channel Sensing Intrinsic Amplitude: 5.125 mV
Lead Channel Setting Pacing Amplitude: 1.5 V
Lead Channel Setting Pacing Amplitude: 2 V
Lead Channel Setting Pacing Pulse Width: 0.4 ms
Lead Channel Setting Pacing Pulse Width: 0.4 ms
MDC IDC LEAD IMPLANT DT: 20170818
MDC IDC LEAD IMPLANT DT: 20170818
MDC IDC LEAD LOCATION: 753858
MDC IDC LEAD LOCATION: 753860
MDC IDC MSMT BATTERY REMAINING LONGEVITY: 79 mo
MDC IDC MSMT BATTERY VOLTAGE: 3.02 V
MDC IDC MSMT LEADCHNL LV IMPEDANCE VALUE: 532 Ohm
MDC IDC MSMT LEADCHNL LV IMPEDANCE VALUE: 570 Ohm
MDC IDC MSMT LEADCHNL LV PACING THRESHOLD AMPLITUDE: 0.5 V
MDC IDC MSMT LEADCHNL RA IMPEDANCE VALUE: 342 Ohm
MDC IDC MSMT LEADCHNL RA IMPEDANCE VALUE: 475 Ohm
MDC IDC MSMT LEADCHNL RA PACING THRESHOLD AMPLITUDE: 0.5 V
MDC IDC MSMT LEADCHNL RA SENSING INTR AMPL: 2.875 mV
MDC IDC MSMT LEADCHNL RV PACING THRESHOLD AMPLITUDE: 0.375 V
MDC IDC MSMT LEADCHNL RV PACING THRESHOLD PULSEWIDTH: 0.4 ms
MDC IDC MSMT LEADCHNL RV SENSING INTR AMPL: 6.125 mV
MDC IDC PG IMPLANT DT: 20170818
MDC IDC SESS DTM: 20181211020853
MDC IDC SET LEADCHNL RA PACING AMPLITUDE: 1.5 V
MDC IDC SET LEADCHNL RV SENSING SENSITIVITY: 0.9 mV
MDC IDC STAT BRADY AP VP PERCENT: 0.1 %
MDC IDC STAT BRADY AS VS PERCENT: 0.01 %
MDC IDC STAT BRADY RV PERCENT PACED: 99.87 %

## 2017-10-30 MED ORDER — HYDROXYCHLOROQUINE SULFATE 200 MG PO TABS: ORAL_TABLET | ORAL | 0 refills | Status: DC

## 2017-10-30 NOTE — Addendum Note (Signed)
Addended by: Carole Binning on: 10/30/2017 02:58 PM   Modules accepted: Orders

## 2017-10-30 NOTE — Telephone Encounter (Signed)
Last visit: 07/02/17 Next visit: 12/10/17 Labs: 10/03/17 WNL PLQ Eye Exam: 02/10/17 WNL    Okay to refill per Dr. Estanislado Pandy

## 2017-10-31 ENCOUNTER — Encounter: Payer: Self-pay | Admitting: Cardiology

## 2017-11-07 ENCOUNTER — Ambulatory Visit
Admission: RE | Admit: 2017-11-07 | Discharge: 2017-11-07 | Disposition: A | Discharge status: Home / Self Care | Payer: Medicare Other | Source: Ambulatory Visit | Attending: Family Medicine | Admitting: Family Medicine

## 2017-11-07 DIAGNOSIS — Z1231 Encounter for screening mammogram for malignant neoplasm of breast: Secondary | ICD-10-CM

## 2017-11-07 HISTORY — DX: Personal history of antineoplastic chemotherapy: Z92.21

## 2017-11-07 HISTORY — DX: Personal history of irradiation: Z92.3

## 2017-11-12 ENCOUNTER — Other Ambulatory Visit: Payer: Self-pay | Admitting: Physician Assistant

## 2017-11-27 NOTE — Progress Notes (Signed)
Office Visit Note  Patient: [name]             Date of Birth: 05-Feb-1946           MRN: [id number]             PCP: Darcus Austin, MD Referring: Darcus Austin, MD Visit Date: 12/10/2017 Occupation: @GUAROCC @    Subjective:  Other (Right hand/finger pain )   History of Present Illness: [name] is a 72 y.o. female with history of sero positive rheumatoid arthritis osteoarthritis and DDD. She states she's been having increased pain and discomfort in her right second digit. She describes the pain between her right second PIP and MCP joint. She states the pain is intermittent and it gets worse while she is holding objects, writing or being on computer. She denies any joint swelling. She continues to have some discomfort in her lower back and neck .  Activities of Daily Living:  Patient reports morning stiffness for 0 minute.   Patient Denies nocturnal pain.  Difficulty dressing/grooming: Denies Difficulty climbing stairs: Denies Difficulty getting out of chair: Denies Difficulty using hands for taps, buttons, cutlery, and/or writing: Reports   Review of Systems  Constitutional: Negative for fatigue, night sweats, weight gain, weight loss and weakness.  HENT: Positive for mouth dryness. Negative for mouth sores, trouble swallowing, trouble swallowing and nose dryness.   Eyes: Negative for pain, redness, visual disturbance and dryness.  Respiratory: Positive for shortness of breath. Negative for cough and difficulty breathing.        History of asthma  Cardiovascular: Negative for chest pain, palpitations, hypertension, irregular heartbeat and swelling in legs/feet.  Gastrointestinal: Negative for blood in stool, constipation and diarrhea.  Endocrine: Negative for increased urination.  Genitourinary: Negative for vaginal dryness.  Musculoskeletal: Positive for arthralgias and joint pain. Negative for joint swelling, myalgias, muscle weakness, morning stiffness,  muscle tenderness and myalgias.  Skin: Negative for color change, rash, hair loss, skin tightness, ulcers and sensitivity to sunlight.  Allergic/Immunologic: Negative for susceptible to infections.  Neurological: Negative for dizziness, memory loss and night sweats.  Hematological: Negative for swollen glands.  Psychiatric/Behavioral: Positive for sleep disturbance. Negative for depressed mood. The patient is nervous/anxious.        Her husband passed away in 10-10-17 from metastatic brain cancer.    PMFS History:  Patient Active Problem List   Diagnosis Date Noted  . History of asthma 06/25/2017  . History of gastroesophageal reflux (GERD) 06/25/2017  . History of anxiety 06/25/2017  . History of bilateral carpal tunnel release 06/25/2017  . Former smoker 01/25/2017  . Rheumatoid arthritis involving multiple sites with positive rheumatoid factor ([place]) 01/17/2017  . High risk medication use 01/17/2017  . Primary osteoarthritis of both hands 01/17/2017  . Primary osteoarthritis of both feet 01/17/2017  . DJD (degenerative joint disease), cervical 01/17/2017  . Spondylosis of lumbar region without myelopathy or radiculopathy 01/17/2017  . Syncope and collapse 07/07/2016  . Cardiomyopathy, ischemic 07/07/2016  . CAD- S/P LAD thrombectomy 07/04/16 07/07/2016  . Pacemaker-MDT BiV placed 07/05/16 07/07/2016  . History of rheumatoid arthritis 07/07/2016  . Complete heart block ([place]) 07/04/2016  . Essential hypertension 07/04/2016  . LBBB (left bundle branch block) 07/04/2016  . Ventricular tachycardia ([place]) 07/04/2016  . Faintness 07/04/2016  . NSTEMI (non-ST elevated myocardial infarction) ([place]) 07/04/2016  . Exostosis 06/18/2011  . History of breast cancer 02/16/2010  . Vitamin D deficiency 02/15/2010  . HYPERCHOLESTEROLEMIA 02/15/2010  . ANXIETY  02/15/2010  . Asthma 02/15/2010  . MIGRAINES, HX OF 02/15/2010  . MASTECTOMY, LEFT, HX OF 02/15/2010    Past Medical History:  Diagnosis  Date  . Anxiety   . Asthma   . Breast cancer ([place])    left breast  . Carpal tunnel syndrome   . Depression   . Habitual alcohol use   . Hypertension   . Personal history of chemotherapy   . Personal history of radiation therapy   . RA (rheumatoid arthritis) ([place])   . Spinal stenosis     Family History  Problem Relation Age of Onset  . Stroke Mother   . Cancer Father        colon?  . Pyelonephritis Sister   . Cancer Paternal Aunt   . Cancer Paternal Uncle    Past Surgical History:  Procedure Laterality Date  . BREAST BIOPSY    . BREAST LUMPECTOMY  2005   left  . CARDIAC CATHETERIZATION N/A 07/04/2016   Procedure: Left Heart Cath and Coronary Angiography;  Surgeon: Belva Crome, MD;  Location: [facility];  Service: Cardiovascular;  Laterality: N/A;  . CARDIAC CATHETERIZATION N/A 07/04/2016   Procedure: Temporary Pacemaker;  Surgeon: Belva Crome, MD;  Location: [facility];  Service: Cardiovascular;  Laterality: N/A;  . EP IMPLANTABLE DEVICE N/A 07/05/2016   Procedure: Pacemaker Implant;  Surgeon: Evans Lance, MD;  Location: [facility];  Service: Cardiovascular;  Laterality: N/A;  . [place]  . LYMPHECTOMY  2005   Social History   Social History Narrative  . Not on file     Objective: Vital Signs: BP 138/69 (BP Location: Right Arm, Patient Position: Sitting, Cuff Size: Normal)   Pulse 68   Resp 15   Ht 5\' 2"  (1.575 m)   Wt 140 lb (63.5 kg)   BMI 25.61 kg/m    Physical Exam  Constitutional: She is oriented to person, place, and time. She appears well-developed and well-nourished.  HENT:  Head: Normocephalic and atraumatic.  Eyes: Conjunctivae and EOM are normal.  Neck: Normal range of motion.  Cardiovascular: Normal rate, regular rhythm, normal heart sounds and intact distal pulses.  pacemaker  Pulmonary/Chest: Effort normal and breath sounds normal.  Abdominal: Soft. Bowel sounds are normal.  Lymphadenopathy:    She has  no cervical adenopathy.  Neurological: She is alert and oriented to person, place, and time.  Skin: Skin is warm and dry. Capillary refill takes less than 2 seconds.  Psychiatric: She has a normal mood and affect. Her behavior is normal.  Nursing note and vitals reviewed.    Musculoskeletal Exam: C-spine good range of motion. She has thoracic kyphosis. She has limited range of motion of her lumbar spine with discomfort. Shoulder joints were good range of motion. Her right elbow joint has contracture. She has limited range of motion of bilateral wrist joints with synovial thickening. She has ulnar deviation and synovial thickening of her MCP joints. She had tendinitis of the right second finger extensor tendon. She has DIP PIP thickening. No synovitis was noted. Hip joints knee joints ankles were good range of motion. She is some tenderness across her MTP joints but no synovitis was noted.  CDAI Exam: CDAI Homunculus Exam:   Joint Counts:  CDAI Tender Joint count: 0 CDAI Swollen Joint count: 0  Global Assessments:  Patient Global Assessment: 1 Provider Global Assessment: 1  CDAI Calculated Score: 2    Investigation: No additional findings.PLQ  eye exam: 02/10/2017 CBC Latest Ref Rng & Units 10/03/2017 07/02/2017 03/24/2017  WBC 3.8 - 10.8 Thousand/uL 6.8 6.8 5.2  Hemoglobin 11.7 - 15.5 g/dL 13.3 13.9 13.6  Hematocrit 35.0 - 45.0 % 38.7 42.3 41.6  Platelets 140 - 400 Thousand/uL 263 267 240   CMP Latest Ref Rng & Units 10/03/2017 07/02/2017 03/24/2017  Glucose 65 - 99 mg/dL 93 90 77  BUN 7 - 25 mg/dL 13 14 16   Creatinine 0.60 - 0.93 mg/dL 0.72 0.61 0.68  Sodium 135 - 146 mmol/L 138 139 139  Potassium 3.5 - 5.3 mmol/L 5.0 4.4 4.5  Chloride 98 - 110 mmol/L 100 101 102  CO2 20 - 32 mmol/L 33(H) 28 29  Calcium 8.6 - 10.4 mg/dL 9.4 9.2 8.9  Total Protein 6.1 - 8.1 g/dL 6.2 6.1 5.9(L)  Total Bilirubin 0.2 - 1.2 mg/dL 0.6 0.5 0.4  Alkaline Phos 33 - 130 U/L - 64 58  AST 10 - 35 U/L 34 32  32  ALT 6 - 29 U/L 27 24 22     Imaging: Xr Foot 2 Views Left  Result Date: 12/10/2017 Subluxation of first and second MTP joints was noted. Narrowing of first, second and third MTP joints was noted. Juxta articular osteopenia was noted. DIP PIP narrowing was noted. Intertarsal joint space narrowing with dorsal spurring and a large calcaneal spur was noted. Impression: These findings are consistent with osteoarthritis and rheumatoid arthritis overlap.  Xr Foot 2 Views Right  Result Date: 12/10/2017 Right first MTP narrowing and subluxation PIP/DIP narrowing was noted. Juxta articular osteopenia was noted. No erosive changes were noted. Intertarsal joint space narrowing with dorsal spurring was noted. Small calcaneal spur was noted. Impression: These findings are consistent with rheumatoid arthritis and osteoarthritis overlap.  Xr Hand 2 View Left  Result Date: 12/10/2017 Juxta articular osteopenia noted. First second and third MCP narrowing was noted. No erosive changes were noted. PIP and DIP narrowing was noted. No significant intercarpal or radiocarpal joint space was noted. Impression: These findings are consistent with rheumatoid arthritis.  Xr Hand 2 View Right  Result Date: 12/10/2017 Narrowing of first, second and third MCP joints with invagination of second and third MCP joints was noted. No erosive changes were noted. PIP and DIP narrowing with subluxation of fourth PIP joint was noted. Intercarpal joint space narrowing was noted. Some cystic changes were noted in the radius and ulna. Impression: These findings are consistent with rheumatoid arthritis.   Speciality Comments: No specialty comments available.    Procedures:  No procedures performed Allergies: Betadine [povidone iodine] and Hydrocodone   Assessment / Plan:     Visit Diagnoses: Rheumatoid arthritis involving multiple sites with positive rheumatoid factor (HCC) - +RF, +anti-CCP, +ANA. Patient has no synovitis on  examination her rheumatoid arthritis seems to be very well controlled.  High risk medication use - PLQ 200 mg bid M-F, MTX 6 tabs po q wk, folic acid 1 mg po qdeye exam: 02/10/2017 - Plan: CBC with Differential/Platelet, COMPLETE METABOLIC PANEL WITH GFR. Her labs have been stable. We will check her labs today. She will get a repeat eye exam in March.  Pain in both hands -she has synovial thickening but no synovitis. She does have osteoarthritis as well. The discomfort she is experiencing in her right second digit appears to be coming from tendinitis. We had detailed discussion regarding that. I will splint her right index finger today. I've also advised her to use diclofenac gel topically. Indications side effects contraindications  were discussed. A prescription for diclofenac gel was given. Plan: XR Hand 2 View Right, XR Hand 2 View Left  Pain in both feet - Plan: XR Foot 2 Views Right, XR Foot 2 Views Left . Proper fitting shoes were discussed.  Primary osteoarthritis of both hands  Primary osteoarthritis of both feet  History of bilateral carpal tunnel release: Doing well  DDD (degenerative disc disease), cervical: She is not having much discomfort currently  DDD (degenerative disc disease), lumbar - s/p laminectomy 1960s:  History of vitamin D deficiency - She is on supplements. Patient reports that she gets bone density through her PCP.  Former smoker  History of hyperlipidemia  History of breast cancer - 2005, lumpectomy, chemotherapy, radiation therapy, lymph node resection  History of anxiety  History of left bundle branch block  History of gastroesophageal reflux (GERD)  History of ventricular tachycardia - History of Pacemaker placement  History of asthma  History of hypertension  Orders: Orders Placed This Encounter  Procedures  . XR Hand 2 View Right  . XR Hand 2 View Left  . XR Foot 2 Views Right  . XR Foot 2 Views Left  . CBC with Differential/Platelet  .  COMPLETE METABOLIC PANEL WITH GFR   Meds ordered this encounter  Medications  . diclofenac sodium (VOLTAREN) 1 % GEL    Sig: Apply 3 gm to 3 large joints up to 3 times a day.Dispense 3 tubes with 3 refills.    Dispense:  3 Tube    Refill:  1    Face-to-face time spent with patient was 30 minutes. Greater than 50% of time was spent in counseling and coordination of care.  Follow-Up Instructions: Return in about 5 months (around 05/10/2018) for Rheumatoid arthritis, Osteoarthritis.   Bo Merino, MD  Note - This record has been created using Editor, commissioning.  Chart creation errors have been sought, but may not always  have been located. Such creation errors do not reflect on  the standard of medical care.

## 2017-12-10 ENCOUNTER — Ambulatory Visit: Payer: Medicare Other | Admitting: Rheumatology

## 2017-12-10 ENCOUNTER — Ambulatory Visit (INDEPENDENT_AMBULATORY_CARE_PROVIDER_SITE_OTHER): Payer: Self-pay

## 2017-12-10 ENCOUNTER — Other Ambulatory Visit: Payer: Self-pay | Admitting: Rheumatology

## 2017-12-10 ENCOUNTER — Encounter: Payer: Self-pay | Admitting: Rheumatology

## 2017-12-10 VITALS — BP 138/69 | HR 68 | Resp 15 | Ht 62.0 in | Wt 140.0 lb

## 2017-12-10 DIAGNOSIS — M5030 Other cervical disc degeneration, unspecified cervical region: Secondary | ICD-10-CM | POA: Diagnosis not present

## 2017-12-10 DIAGNOSIS — M19041 Primary osteoarthritis, right hand: Secondary | ICD-10-CM | POA: Diagnosis not present

## 2017-12-10 DIAGNOSIS — M79672 Pain in left foot: Secondary | ICD-10-CM | POA: Diagnosis not present

## 2017-12-10 DIAGNOSIS — Z87891 Personal history of nicotine dependence: Secondary | ICD-10-CM

## 2017-12-10 DIAGNOSIS — M79642 Pain in left hand: Secondary | ICD-10-CM

## 2017-12-10 DIAGNOSIS — Z79899 Other long term (current) drug therapy: Secondary | ICD-10-CM | POA: Diagnosis not present

## 2017-12-10 DIAGNOSIS — Z8709 Personal history of other diseases of the respiratory system: Secondary | ICD-10-CM

## 2017-12-10 DIAGNOSIS — Z8679 Personal history of other diseases of the circulatory system: Secondary | ICD-10-CM

## 2017-12-10 DIAGNOSIS — Z8639 Personal history of other endocrine, nutritional and metabolic disease: Secondary | ICD-10-CM | POA: Diagnosis not present

## 2017-12-10 DIAGNOSIS — Z8659 Personal history of other mental and behavioral disorders: Secondary | ICD-10-CM

## 2017-12-10 DIAGNOSIS — M0579 Rheumatoid arthritis with rheumatoid factor of multiple sites without organ or systems involvement: Secondary | ICD-10-CM | POA: Diagnosis not present

## 2017-12-10 DIAGNOSIS — M79641 Pain in right hand: Secondary | ICD-10-CM

## 2017-12-10 DIAGNOSIS — M19071 Primary osteoarthritis, right ankle and foot: Secondary | ICD-10-CM

## 2017-12-10 DIAGNOSIS — Z9889 Other specified postprocedural states: Secondary | ICD-10-CM | POA: Diagnosis not present

## 2017-12-10 DIAGNOSIS — Z853 Personal history of malignant neoplasm of breast: Secondary | ICD-10-CM

## 2017-12-10 DIAGNOSIS — M5136 Other intervertebral disc degeneration, lumbar region: Secondary | ICD-10-CM | POA: Diagnosis not present

## 2017-12-10 DIAGNOSIS — Z8719 Personal history of other diseases of the digestive system: Secondary | ICD-10-CM

## 2017-12-10 DIAGNOSIS — M79671 Pain in right foot: Secondary | ICD-10-CM

## 2017-12-10 DIAGNOSIS — M19072 Primary osteoarthritis, left ankle and foot: Secondary | ICD-10-CM

## 2017-12-10 DIAGNOSIS — M19042 Primary osteoarthritis, left hand: Secondary | ICD-10-CM

## 2017-12-10 MED ORDER — DICLOFENAC SODIUM 1 % TD GEL
TRANSDERMAL | 1 refills | Status: DC
Start: 2017-12-10 — End: 2018-07-10

## 2017-12-10 NOTE — Patient Instructions (Signed)
Standing Labs We placed an order today for your standing lab work.    Please come back and get your standing labs in May and every 3 months  We have open lab Monday through Friday from 8:30-11:30 AM and 1:30-4 PM at the office of Dr. Justinn Welter.   The office is located at [address], [address], [place], [place] [address] No appointment is necessary.   Labs are drawn by [facility].  You may receive a bill from [facility] for your lab work. If you have any questions regarding directions or hours of operation,  please call [phone].    

## 2017-12-11 LAB — CBC WITH DIFFERENTIAL/PLATELET
BASOS PCT: 1.2 %
Basophils Absolute: 77 cells/uL (ref 0–200)
Eosinophils Absolute: 486 cells/uL (ref 15–500)
Eosinophils Relative: 7.6 %
HCT: 39.6 % (ref 35.0–45.0)
Hemoglobin: 13.3 g/dL (ref 11.7–15.5)
LYMPHS ABS: 1184 {cells}/uL (ref 850–3900)
MCH: 32 pg (ref 27.0–33.0)
MCHC: 33.6 g/dL (ref 32.0–36.0)
MCV: 95.2 fL (ref 80.0–100.0)
MPV: 9.4 fL (ref 7.5–12.5)
Monocytes Relative: 11.4 %
Neutro Abs: 3923 cells/uL (ref 1500–7800)
Neutrophils Relative %: 61.3 %
Platelets: 261 10*3/uL (ref 140–400)
RBC: 4.16 10*6/uL (ref 3.80–5.10)
RDW: 12.8 % (ref 11.0–15.0)
TOTAL LYMPHOCYTE: 18.5 %
WBC: 6.4 10*3/uL (ref 3.8–10.8)
WBCMIX: 730 {cells}/uL (ref 200–950)

## 2017-12-11 LAB — COMPLETE METABOLIC PANEL WITH GFR
AG RATIO: 1.4 (calc) (ref 1.0–2.5)
ALKALINE PHOSPHATASE (APISO): 66 U/L (ref 33–130)
ALT: 24 U/L (ref 6–29)
AST: 31 U/L (ref 10–35)
Albumin: 3.7 g/dL (ref 3.6–5.1)
BUN: 16 mg/dL (ref 7–25)
CALCIUM: 9.4 mg/dL (ref 8.6–10.4)
CO2: 33 mmol/L — AB (ref 20–32)
Chloride: 102 mmol/L (ref 98–110)
Creat: 0.64 mg/dL (ref 0.60–0.93)
GFR, EST NON AFRICAN AMERICAN: 89 mL/min/{1.73_m2} (ref >=60)
GFR, Est African American: 103 mL/min/{1.73_m2} (ref >=60)
Globulin: 2.6 g/dL (calc) (ref 1.9–3.7)
Glucose, Bld: 97 mg/dL (ref 65–99)
POTASSIUM: 5 mmol/L (ref 3.5–5.3)
Sodium: 140 mmol/L (ref 135–146)
Total Bilirubin: 0.5 mg/dL (ref 0.2–1.2)
Total Protein: 6.3 g/dL (ref 6.1–8.1)

## 2017-12-13 ENCOUNTER — Emergency Department (HOSPITAL_BASED_OUTPATIENT_CLINIC_OR_DEPARTMENT_OTHER)
Admission: EM | Admit: 2017-12-13 | Discharge: 2017-12-13 | Disposition: A | Discharge status: Home / Self Care | Payer: Medicare Other | Attending: Emergency Medicine | Admitting: Emergency Medicine

## 2017-12-13 ENCOUNTER — Emergency Department (HOSPITAL_BASED_OUTPATIENT_CLINIC_OR_DEPARTMENT_OTHER): Payer: Medicare Other

## 2017-12-13 ENCOUNTER — Other Ambulatory Visit: Payer: Self-pay

## 2017-12-13 ENCOUNTER — Encounter (HOSPITAL_BASED_OUTPATIENT_CLINIC_OR_DEPARTMENT_OTHER): Payer: Self-pay | Admitting: Emergency Medicine

## 2017-12-13 DIAGNOSIS — Z7982 Long term (current) use of aspirin: Secondary | ICD-10-CM | POA: Diagnosis not present

## 2017-12-13 DIAGNOSIS — Z95810 Presence of automatic (implantable) cardiac defibrillator: Secondary | ICD-10-CM | POA: Diagnosis not present

## 2017-12-13 DIAGNOSIS — Z853 Personal history of malignant neoplasm of breast: Secondary | ICD-10-CM | POA: Diagnosis not present

## 2017-12-13 DIAGNOSIS — I252 Old myocardial infarction: Secondary | ICD-10-CM | POA: Diagnosis not present

## 2017-12-13 DIAGNOSIS — Z79899 Other long term (current) drug therapy: Secondary | ICD-10-CM | POA: Diagnosis not present

## 2017-12-13 DIAGNOSIS — M542 Cervicalgia: Secondary | ICD-10-CM | POA: Insufficient documentation

## 2017-12-13 DIAGNOSIS — Z87891 Personal history of nicotine dependence: Secondary | ICD-10-CM | POA: Insufficient documentation

## 2017-12-13 DIAGNOSIS — J45909 Unspecified asthma, uncomplicated: Secondary | ICD-10-CM | POA: Insufficient documentation

## 2017-12-13 DIAGNOSIS — I10 Essential (primary) hypertension: Secondary | ICD-10-CM | POA: Insufficient documentation

## 2017-12-13 DIAGNOSIS — R200 Anesthesia of skin: Secondary | ICD-10-CM

## 2017-12-13 DIAGNOSIS — M069 Rheumatoid arthritis, unspecified: Secondary | ICD-10-CM | POA: Insufficient documentation

## 2017-12-13 DIAGNOSIS — R202 Paresthesia of skin: Secondary | ICD-10-CM | POA: Insufficient documentation

## 2017-12-13 LAB — CBC WITH DIFFERENTIAL/PLATELET
Basophils Absolute: 0 10*3/uL (ref 0.0–0.1)
Basophils Relative: 1 %
EOS PCT: 6 %
Eosinophils Absolute: 0.3 10*3/uL (ref 0.0–0.7)
HEMATOCRIT: 40.5 % (ref 36.0–46.0)
Hemoglobin: 13.6 g/dL (ref 12.0–15.0)
LYMPHS ABS: 1 10*3/uL (ref 0.7–4.0)
LYMPHS PCT: 18 %
MCH: 32.4 pg (ref 26.0–34.0)
MCHC: 33.6 g/dL (ref 30.0–36.0)
MCV: 96.4 fL (ref 78.0–100.0)
MONO ABS: 0.8 10*3/uL (ref 0.1–1.0)
Monocytes Relative: 15 %
NEUTROS ABS: 3.3 10*3/uL (ref 1.7–7.7)
Neutrophils Relative %: 60 %
PLATELETS: 231 10*3/uL (ref 150–400)
RBC: 4.2 MIL/uL (ref 3.87–5.11)
RDW: 14.1 % (ref 11.5–15.5)
WBC: 5.4 10*3/uL (ref 4.0–10.5)

## 2017-12-13 LAB — COMPREHENSIVE METABOLIC PANEL
ALK PHOS: 66 U/L (ref 38–126)
ALT: 26 U/L (ref 14–54)
AST: 34 U/L (ref 15–41)
Albumin: 3.5 g/dL (ref 3.5–5.0)
Anion gap: 5 (ref 5–15)
BILIRUBIN TOTAL: 0.6 mg/dL (ref 0.3–1.2)
BUN: 15 mg/dL (ref 6–20)
CALCIUM: 9.3 mg/dL (ref 8.9–10.3)
CO2: 31 mmol/L (ref 22–32)
Chloride: 102 mmol/L (ref 101–111)
Creatinine, Ser: 0.72 mg/dL (ref 0.44–1.00)
GFR calc Af Amer: >60 mL/min (ref >60)
GLUCOSE: 100 mg/dL — AB (ref 65–99)
POTASSIUM: 4.3 mmol/L (ref 3.5–5.1)
Sodium: 138 mmol/L (ref 135–145)
TOTAL PROTEIN: 6.6 g/dL (ref 6.5–8.1)

## 2017-12-13 LAB — PROTIME-INR
INR: 0.98
PROTHROMBIN TIME: 12.9 s (ref 11.4–15.2)

## 2017-12-13 LAB — TROPONIN I: Troponin I: <0.03 ng/mL (ref <0.03)

## 2017-12-13 MED ORDER — ACETAMINOPHEN 325 MG PO TABS
650.0000 mg | ORAL_TABLET | Freq: Once | ORAL | Status: AC
  Administered 2017-12-13: 650 mg via ORAL
  Filled 2017-12-13: qty 2

## 2017-12-13 MED ORDER — IOPAMIDOL (ISOVUE-370) INJECTION 76%
125.0000 mL | Freq: Once | INTRAVENOUS | Status: AC | PRN
  Administered 2017-12-13: 100 mL via INTRAVENOUS

## 2017-12-13 NOTE — ED Notes (Signed)
Patient transported to CT 

## 2017-12-13 NOTE — ED Notes (Signed)
Pt on cardiac monitor and auto VS 

## 2017-12-13 NOTE — ED Triage Notes (Addendum)
States yesterday afternoon around 4pm, started having right sided neck pain and then shortly after developed right arm numbness . Denies prior hx of same . " More tired than usual" Denies n/v , movement of neck does not increase pain

## 2017-12-13 NOTE — ED Notes (Signed)
ED Provider at bedside. 

## 2017-12-13 NOTE — Discharge Instructions (Signed)
You were evaluated in the emergency department for right neck pain and right arm tingling.  Your workup did not reveal a cause of this.  There was no signs of stroke and no signs of blockage on the blood vessel in that area.  You did have an incidental finding on the CAT scan showing a left subclavian narrowing.  This would not account for your symptoms.  You would need to follow-up with your primary care doctor and potentially a vascular surgeon for further workup of this.  Please follow-up with your doctor and if you experience any increased areas of numbness or any weakness he should return to the hospital.

## 2017-12-13 NOTE — ED Notes (Signed)
Delay in xray, RN starting IV

## 2017-12-13 NOTE — ED Provider Notes (Signed)
[facility] Provider Note   CSN: [id number] Arrival date & time: 12/13/17  1121     History   Chief Complaint Chief Complaint  Patient presents with  . Neck Pain    arm numbness     HPI Bita Cartwright Tates is a 72 y.o. female.  The history is provided by the patient.  Neck Pain   This is a new problem. The current episode started yesterday. The problem occurs constantly. The problem has not changed since onset.The pain is associated with nothing. There has been no fever. The pain is present in the right side. The quality of the pain is described as aching. The pain does not radiate. The pain is at a severity of 3/10. The symptoms are aggravated by position. Associated symptoms include numbness and tingling. Pertinent negatives include no photophobia, no visual change, no chest pain, no syncope, no bowel incontinence, no bladder incontinence, no leg pain, no paresis and no weakness. She has tried nothing for the symptoms.    72 year old female with history of breast cancer attention rheumatoid arthritis presents with complaint of acute onset of right lateral neck pain yesterday 3 out of 10 intensity increased with palpation and movement followed by right arm numbness and tingling.  She is never had these symptoms before.  She states there is no weakness in the arm and there is no associated facial numbness weakness speech difficulty or leg weakness or numbness.  No bowel or bladder incontinence.  There was no trauma.  It was associated with a mild headache which is resolved.  Past Medical History:  Diagnosis Date  . Anxiety   . Asthma   . Breast cancer ([place])    left breast  . Carpal tunnel syndrome   . Depression   . Habitual alcohol use   . Hypertension   . Personal history of chemotherapy   . Personal history of radiation therapy   . RA (rheumatoid arthritis) ([place])   . Spinal stenosis     Patient Active Problem List   Diagnosis Date Noted  .  History of asthma 06/25/2017  . History of gastroesophageal reflux (GERD) 06/25/2017  . History of anxiety 06/25/2017  . History of bilateral carpal tunnel release 06/25/2017  . Former smoker 01/25/2017  . Rheumatoid arthritis involving multiple sites with positive rheumatoid factor ([place]) 01/17/2017  . High risk medication use 01/17/2017  . Primary osteoarthritis of both hands 01/17/2017  . Primary osteoarthritis of both feet 01/17/2017  . DJD (degenerative joint disease), cervical 01/17/2017  . Spondylosis of lumbar region without myelopathy or radiculopathy 01/17/2017  . Syncope and collapse 07/07/2016  . Cardiomyopathy, ischemic 07/07/2016  . CAD- S/P LAD thrombectomy 07/04/16 07/07/2016  . Pacemaker-MDT BiV placed 07/05/16 07/07/2016  . History of rheumatoid arthritis 07/07/2016  . Complete heart block ([place]) 07/04/2016  . Essential hypertension 07/04/2016  . LBBB (left bundle branch block) 07/04/2016  . Ventricular tachycardia ([place]) 07/04/2016  . Faintness 07/04/2016  . NSTEMI (non-ST elevated myocardial infarction) ([place]) 07/04/2016  . Exostosis 06/18/2011  . History of breast cancer 02/16/2010  . Vitamin D deficiency 02/15/2010  . HYPERCHOLESTEROLEMIA 02/15/2010  . ANXIETY 02/15/2010  . Asthma 02/15/2010  . MIGRAINES, HX OF 02/15/2010  . MASTECTOMY, LEFT, HX OF 02/15/2010    Past Surgical History:  Procedure Laterality Date  . BREAST BIOPSY    . BREAST LUMPECTOMY  2005   left  . CARDIAC CATHETERIZATION N/A 07/04/2016   Procedure: Left Heart Cath and Coronary Angiography;  Surgeon: Belva Crome, MD;  Location: [facility];  Service: Cardiovascular;  Laterality: N/A;  . CARDIAC CATHETERIZATION N/A 07/04/2016   Procedure: Temporary Pacemaker;  Surgeon: Belva Crome, MD;  Location: [facility];  Service: Cardiovascular;  Laterality: N/A;  . EP IMPLANTABLE DEVICE N/A 07/05/2016   Procedure: Pacemaker Implant;  Surgeon: Evans Lance, MD;  Location: [facility];  Service: Cardiovascular;  Laterality: N/A;  . [place]  . LYMPHECTOMY  2005    OB History    No data available       Home Medications    Prior to Admission medications   Medication Sig Start Date End Date Taking? Authorizing Provider  albuterol (PROVENTIL HFA;VENTOLIN HFA) 108 (90 Base) MCG/ACT inhaler Inhale into the lungs every 6 (six) hours as needed for wheezing or shortness of breath.   Yes [provider]  aspirin 81 MG tablet Take 81 mg by mouth daily.     Yes [provider]  atorvastatin (LIPITOR) 40 MG tablet Take 1 tablet (40 mg total) by mouth daily at 6 PM. 07/15/17  Yes Evans Lance, MD  budesonide-formoterol [facility]) 160-4.5 MCG/ACT inhaler Inhale 2 puffs into the lungs 2 (two) times daily.   Yes [provider]  calcium carbonate (OS-CAL) 600 MG TABS Take 600 mg by mouth 2 (two) times daily with a meal.     Yes [provider]  carvedilol (COREG) 3.125 MG tablet TAKE 1 TABLET(3.125 MG) BY MOUTH TWICE DAILY 07/07/17  Yes Eileen Stanford, PA-C  citalopram (CELEXA) 20 MG tablet Take 20 mg by mouth daily. 04/28/17  Yes [provider]  clonazePAM (KLONOPIN) 0.5 MG tablet Take 0.5 mg by mouth every 12 (twelve) hours as needed for anxiety. 06/12/16  Yes [provider]  diclofenac sodium (VOLTAREN) 1 % GEL Apply 3 gm to 3 large joints up to 3 times a day.Dispense 3 tubes with 3 refills. 12/10/17  Yes Deveshwar, Abel Presto, MD  Ergocalciferol (VITAMIN D2) 2000 units TABS Take 2,000 Units by mouth daily.   Yes [provider]  folic acid (FOLVITE) 1 MG tablet TAKE ONE TABLET BY MOUTH ONCE DAILY 08/26/17  Yes Deveshwar, Abel Presto, MD  hydroxychloroquine (PLAQUENIL) 200 MG tablet TAKE 1 TABLET BY MOUTH TWICE DAILY MONDAY THROUGH FRIDAY 10/30/17  Yes Deveshwar, Abel Presto, MD  losartan (COZAAR) 25 MG tablet TAKE 1 TABLET(25 MG) BY MOUTH DAILY 11/12/17  Yes Eileen Stanford, PA-C  methotrexate (RHEUMATREX) 2.5  MG tablet TAKE 6 TABLETS BY MOUTH ONCE WEEKLY* CAUTION CHEMOTHERAPY. PROTECT FROM LIGHT 10/01/17  Yes Deveshwar, Abel Presto, MD  nitroGLYCERIN (NITROSTAT) 0.4 MG SL tablet Place 1 tablet (0.4 mg total) under the tongue every 5 (five) minutes as needed for chest pain. 07/07/16  Yes Arbutus Leas, NP  pantoprazole (PROTONIX) 40 MG tablet Take 1 tablet (40 mg total) by mouth daily. 10/29/17  Yes Belva Crome, MD  acetaminophen (TYLENOL) 325 MG tablet Take 2 tablets (650 mg total) by mouth every 4 (four) hours as needed for headache or mild pain. 07/07/16   Erlene Quan, PA-C    Family History Family History  Problem Relation Age of Onset  . Stroke Mother   . Cancer Father        colon?  . Pyelonephritis Sister   . Cancer Paternal Aunt   . Cancer Paternal Uncle     Social History Social History   Tobacco Use  . Smoking status: Former Research scientist (life sciences)  .  Smokeless tobacco: Never Used  . Tobacco comment: quit 1995  Substance Use Topics  . Alcohol use: Yes    Alcohol/week: 1.2 - 1.8 oz    Types: 2 - 3 Cans of beer per week    Comment: 3 DAILY  . Drug use: No     Allergies   Betadine [povidone iodine] and Hydrocodone   Review of Systems Review of Systems  Constitutional: Negative for chills and fever.  HENT: Negative for ear pain and sore throat.   Eyes: Negative for photophobia, pain and visual disturbance.  Respiratory: Negative for cough and shortness of breath.   Cardiovascular: Negative for chest pain, palpitations and syncope.  Gastrointestinal: Negative for abdominal pain, bowel incontinence and vomiting.  Genitourinary: Negative for bladder incontinence, dysuria and hematuria.  Musculoskeletal: Positive for neck pain. Negative for arthralgias and back pain.  Skin: Negative for color change and rash.  Neurological: Positive for tingling and numbness. Negative for seizures, syncope and weakness.  All other systems reviewed and are negative.    Physical Exam Updated Vital  Signs BP (!) 150/68   Pulse 65   Temp 98.7 F (37.1 C) (Oral)   Resp 11   Ht 5\' 2"  (1.575 m)   Wt 62.6 kg (138 lb)   SpO2 98%   BMI 25.24 kg/m   Physical Exam  Constitutional: She appears well-developed and well-nourished. No distress.  HENT:  Head: Normocephalic and atraumatic.  Eyes: Conjunctivae are normal.  Neck: Neck supple.  Cardiovascular: Normal rate and regular rhythm.  No murmur heard. Pulses:      Carotid pulses are on the right side with bruit, and on the left side with bruit.      Radial pulses are 2+ on the right side, and 1+ on the left side.  Pulmonary/Chest: Effort normal and breath sounds normal. No respiratory distress.  Abdominal: Soft. There is no tenderness.  Musculoskeletal: Normal range of motion. She exhibits no edema.  Neurological: She is alert. She has normal strength. A sensory deficit (r arm mostly circumfrential) is present. No cranial nerve deficit. Gait normal. GCS eye subscore is 4. GCS verbal subscore is 5. GCS motor subscore is 6.  Reflex Scores:      Bicep reflexes are 2+ on the right side and 2+ on the left side. Skin: Skin is warm and dry.  Psychiatric: She has a normal mood and affect.  Nursing note and vitals reviewed.    ED Treatments / Results  Labs (all labs ordered are listed, but only abnormal results are displayed) Labs Reviewed  COMPREHENSIVE METABOLIC PANEL - Abnormal; Notable for the following components:      Result Value   Glucose, Bld 100 (*)    All other components within normal limits  CBC WITH DIFFERENTIAL/PLATELET  TROPONIN I  PROTIME-INR    EKG  EKG Interpretation  Date/Time:  Saturday December 13 2017 11:28:47 EST Ventricular Rate:  71 PR Interval:    QRS Duration: 129 QT Interval:  518 QTC Calculation: 563 R Axis:   -87 Text Interpretation:  Atrial-sensed ventricular-paced rhythm No further analysis attempted due to paced rhythm similar pattern to prior 11/17 Confirmed by Aletta Edouard [phone]) on  12/13/2017 11:52:53 AM       Radiology Ct Angio Head W Or Wo Contrast  Result Date: 12/13/2017 CLINICAL DATA:  Right arm numbness and weakness. Right-sided neck pain beginning yesterday. Headache. EXAM: CT ANGIOGRAPHY HEAD AND NECK TECHNIQUE: Multidetector CT imaging of the head and neck was performed using  the standard protocol during bolus administration of intravenous contrast. Multiplanar CT image reconstructions and MIPs were obtained to evaluate the vascular anatomy. Carotid stenosis measurements (when applicable) are obtained utilizing NASCET criteria, using the distal internal carotid diameter as the denominator. CONTRAST:  122mL ISOVUE-370 IOPAMIDOL (ISOVUE-370) INJECTION 76% COMPARISON:  09/21/2016 CT. FINDINGS: CT HEAD FINDINGS Brain: Mild age related atrophy. Minimal small vessel change of the white matter. No evidence of acute infarction, mass lesion, hemorrhage, hydrocephalus or extra-axial collection. Vascular: There is atherosclerotic calcification of the major vessels at the base of the brain. Skull: Normal. Pronounced degenerative change of the temporomandibular joints is incidentally noted. Sinuses: Clear Orbits: Normal Review of the MIP images confirms the above findings CTA NECK FINDINGS Aortic arch: Aortic atherosclerosis. No aneurysm or dissection. Branching pattern of the brachiocephalic vessels from the arch is normal. Right carotid system: Common carotid artery is tortuous but widely patent to the bifurcation. Carotid bifurcation is normal without plaque, stenosis or irregularity. Cervical ICA is normal. Left carotid system: Common carotid artery widely patent to the bifurcation. Carotid bifurcation and cervical ICA are normal. Vertebral arteries: Both vertebral artery origins are widely patent. Both vertebral arteries are patent and normal through the neck to the foramen magnum. Right subclavian artery appears widely patent. Left subclavian artery is severely diseased distal to the  left vertebral artery origin, with serial stenoses quite likely to be flow limiting. Does the patient have differential upper extremity blood pressures? Skeleton: Ordinary cervical spondylosis. Other neck: No mass or lymphadenopathy. Upper chest: Mild pulmonary scarring.  No active process. Review of the MIP images confirms the above findings CTA HEAD FINDINGS Anterior circulation: Both internal carotid arteries are patent through the siphon region. There is ordinary peripheral calcification in the carotid siphon regions without stenosis. The anterior and middle cerebral vessels are patent without proximal stenosis, aneurysm or vascular malformation. Posterior circulation: Both vertebral arteries are widely patent through the foramen magnum. Both posterior inferior cerebellar arteries are patent. Both vertebral arteries supply the basilar. No basilar stenosis. Superior cerebellar and posterior cerebral vessels are normal. Venous sinuses: Patent and normal. Anatomic variants: None significant. Delayed phase: No abnormal enhancement. Review of the MIP images confirms the above findings IMPRESSION: No significant vascular finding. No carotid bifurcation disease. Ordinary mild atherosclerosis in the carotid siphon regions without stenosis. No intracranial vascular abnormality. Severely disease left subclavian artery with serial stenoses beginning distal to the left vertebral artery origin which are likely flow limiting. I would assume that there are differential upper extremity blood pressures. Degenerative cervical spondylosis. Advanced chronic arthropathy of the temporomandibular joints. Electronically Signed   By: Nelson Chimes M.D.   On: 12/13/2017 12:50   Dg Chest 2 View  Result Date: 12/13/2017 CLINICAL DATA:  RIGHT-sided neck pain with RIGHT arm numbness since yesterday, history asthma, breast cancer, hypertension, rheumatoid arthritis EXAM: CHEST  2 VIEW COMPARISON:  08/07/2016 FINDINGS: LEFT subclavian  transvenous pacemaker leads project at RIGHT atrium, RIGHT ventricle, and coronary sinus. Atherosclerotic calcification thoracic aorta. Normal heart size, mediastinal contours, and pulmonary vascularity. Bronchitic changes without infiltrate, pleural effusion or pneumothorax. No acute osseous findings. IMPRESSION: Bronchitic changes without infiltrate. Electronically Signed   By: Lavonia Dana M.D.   On: 12/13/2017 12:26   Ct Angio Neck W Or Wo Contrast  Result Date: 12/13/2017 CLINICAL DATA:  Right arm numbness and weakness. Right-sided neck pain beginning yesterday. Headache. EXAM: CT ANGIOGRAPHY HEAD AND NECK TECHNIQUE: Multidetector CT imaging of the head and neck was performed using the  standard protocol during bolus administration of intravenous contrast. Multiplanar CT image reconstructions and MIPs were obtained to evaluate the vascular anatomy. Carotid stenosis measurements (when applicable) are obtained utilizing NASCET criteria, using the distal internal carotid diameter as the denominator. CONTRAST:  135mL ISOVUE-370 IOPAMIDOL (ISOVUE-370) INJECTION 76% COMPARISON:  09/21/2016 CT. FINDINGS: CT HEAD FINDINGS Brain: Mild age related atrophy. Minimal small vessel change of the white matter. No evidence of acute infarction, mass lesion, hemorrhage, hydrocephalus or extra-axial collection. Vascular: There is atherosclerotic calcification of the major vessels at the base of the brain. Skull: Normal. Pronounced degenerative change of the temporomandibular joints is incidentally noted. Sinuses: Clear Orbits: Normal Review of the MIP images confirms the above findings CTA NECK FINDINGS Aortic arch: Aortic atherosclerosis. No aneurysm or dissection. Branching pattern of the brachiocephalic vessels from the arch is normal. Right carotid system: Common carotid artery is tortuous but widely patent to the bifurcation. Carotid bifurcation is normal without plaque, stenosis or irregularity. Cervical ICA is normal.  Left carotid system: Common carotid artery widely patent to the bifurcation. Carotid bifurcation and cervical ICA are normal. Vertebral arteries: Both vertebral artery origins are widely patent. Both vertebral arteries are patent and normal through the neck to the foramen magnum. Right subclavian artery appears widely patent. Left subclavian artery is severely diseased distal to the left vertebral artery origin, with serial stenoses quite likely to be flow limiting. Does the patient have differential upper extremity blood pressures? Skeleton: Ordinary cervical spondylosis. Other neck: No mass or lymphadenopathy. Upper chest: Mild pulmonary scarring.  No active process. Review of the MIP images confirms the above findings CTA HEAD FINDINGS Anterior circulation: Both internal carotid arteries are patent through the siphon region. There is ordinary peripheral calcification in the carotid siphon regions without stenosis. The anterior and middle cerebral vessels are patent without proximal stenosis, aneurysm or vascular malformation. Posterior circulation: Both vertebral arteries are widely patent through the foramen magnum. Both posterior inferior cerebellar arteries are patent. Both vertebral arteries supply the basilar. No basilar stenosis. Superior cerebellar and posterior cerebral vessels are normal. Venous sinuses: Patent and normal. Anatomic variants: None significant. Delayed phase: No abnormal enhancement. Review of the MIP images confirms the above findings IMPRESSION: No significant vascular finding. No carotid bifurcation disease. Ordinary mild atherosclerosis in the carotid siphon regions without stenosis. No intracranial vascular abnormality. Severely disease left subclavian artery with serial stenoses beginning distal to the left vertebral artery origin which are likely flow limiting. I would assume that there are differential upper extremity blood pressures. Degenerative cervical spondylosis. Advanced  chronic arthropathy of the temporomandibular joints. Electronically Signed   By: Nelson Chimes M.D.   On: 12/13/2017 12:50    Procedures Procedures (including critical care time)  Medications Ordered in ED Medications - No data to display   Initial Impression / Assessment and Plan / ED Course  I have reviewed the triage vital signs and the nursing notes.  Pertinent labs & imaging results that were available during my care of the patient were reviewed by me and considered in my medical decision making (see chart for details).       72 year old female with neck pain and right arm paresthesias.  She has tenderness over her lateral neck and there is no history of any trauma.  Her CT angios neck and CT brain did not show any acute findings to explain her complaints.  She does have an incidental left subclavian stenosis and did have a diminished pulse on that side but she  has no symptoms.  We reviewed her workup and she is comfortable following up with her doctor and returning if any acute worsening.  Final Clinical Impressions(s) / ED Diagnoses   Final diagnoses:  Neck pain  Numbness and tingling of right arm    ED Discharge Orders    None       Hayden Rasmussen, MD 12/14/17 1545

## 2017-12-15 ENCOUNTER — Encounter: Payer: Self-pay | Admitting: Interventional Cardiology

## 2017-12-15 ENCOUNTER — Telehealth: Payer: Self-pay | Admitting: Interventional Cardiology

## 2017-12-15 DIAGNOSIS — I771 Stricture of artery: Secondary | ICD-10-CM | POA: Insufficient documentation

## 2017-12-15 NOTE — Telephone Encounter (Signed)
Spoke with pt and made her aware of information provided by Dr. Tamala Julian. Pt not having any issues with left arm at this time.  Advised pt if she develops any issues with left arm to let us know.  Pt verbalized understanding and was appreciative for call.

## 2017-12-15 NOTE — Telephone Encounter (Signed)
New Message   Patient states that on Saturday she went to the ER. They did a CT scan and found a left ssubclavin artery that is narrowing and was told that she needed to let her cardiologist know. Please call to disucss

## 2017-12-15 NOTE — Telephone Encounter (Signed)
Pt went to ER for neck pain and numbness/tingling of rt arm.  CT scan showed severely diseased left subclavian artery with serial stenosis.  Will route to Dr. Tamala Julian for review and advisement.

## 2017-12-15 NOTE — Telephone Encounter (Signed)
No action required unless left arm complaints.

## 2017-12-22 ENCOUNTER — Other Ambulatory Visit: Payer: Self-pay | Admitting: Physician Assistant

## 2017-12-22 ENCOUNTER — Other Ambulatory Visit: Payer: Self-pay | Admitting: Rheumatology

## 2017-12-22 NOTE — Telephone Encounter (Signed)
Last visit: 12/10/2017 Next visit: 06/17/2018 Labs: 12/10/2017 WNL   Okay to refill per Dr. Estanislado Pandy.

## 2018-01-07 ENCOUNTER — Ambulatory Visit: Payer: Medicare Other | Admitting: Neurology

## 2018-01-07 ENCOUNTER — Other Ambulatory Visit: Payer: Self-pay

## 2018-01-07 ENCOUNTER — Encounter: Payer: Self-pay | Admitting: Neurology

## 2018-01-07 VITALS — BP 138/68 | HR 74 | Resp 18 | Ht 62.0 in | Wt 141.0 lb

## 2018-01-07 DIAGNOSIS — I771 Stricture of artery: Secondary | ICD-10-CM | POA: Diagnosis not present

## 2018-01-07 DIAGNOSIS — M5412 Radiculopathy, cervical region: Secondary | ICD-10-CM | POA: Insufficient documentation

## 2018-01-07 DIAGNOSIS — M4802 Spinal stenosis, cervical region: Secondary | ICD-10-CM | POA: Diagnosis not present

## 2018-01-07 DIAGNOSIS — M79601 Pain in right arm: Secondary | ICD-10-CM | POA: Insufficient documentation

## 2018-01-07 DIAGNOSIS — M0579 Rheumatoid arthritis with rheumatoid factor of multiple sites without organ or systems involvement: Secondary | ICD-10-CM | POA: Diagnosis not present

## 2018-01-07 MED ORDER — CELECOXIB 200 MG PO CAPS
200.0000 mg | ORAL_CAPSULE | Freq: Two times a day (BID) | ORAL | 5 refills | Status: DC
Start: 2018-01-07 — End: 2018-01-15

## 2018-01-07 NOTE — Progress Notes (Signed)
[facility]  PATIENT: [name] DOB: June 28, 1946  REFERRING DOCTOR OR PCP:  [facility]) SOURCE:   _________________________________   HISTORICAL  CHIEF COMPLAINT:  Chief Complaint  Patient presents with  . Numbness    Intermittent numbness from right side of her neck down right arm to hand, worse from shoulder to elbow.  Onset 12/12/17.  Seen in the ER and CVA r/o.  Has seen pcp and referred here. No imaging studies of neck or arm.  Hx. of MI, pacemaker/fim    HISTORY OF PRESENT ILLNESS:  I had the pleasure seeing you patient, [name], at [facility] for neurologic consultation regarding her intermittent right-sided numbness  She had the onset of right neck and arm numbness 12/05/2017 associated mild pain in the entire arm.   She was concerned about a stroke and went to the ED.      in the ED, she had CT of the head and CT angiogram of the neck and head. There is no evidence of acute stroke. There is no evidence of arterial dissection. She did have significant stenosis in the left subclavian artery without appears to be asymptomatic. Cervical spondylosis was reported.   Laboratory testing was fine.   She was referred for further evaluation.  Currently , she has no significant neck pain or numbness,   She has numbness and tingling between the elbow and shoulder associated with discomfort and sometimes pain and occasionally has radiation into the hands (all fingers).    She feels bladder function is unchanged.   She has had 2 x nocturia for many years.    She feels leg function is fine.   She feels balance is mildly worse over the last 3-4 years.   She has no recent falls.  Her hands and arms have felt weaker for the past 3 years.  She has no significant head or neck trauma.     She had an MI and is on ASA.   She has a pacemaker placed in 06/2016.   She sees Dr. Pernell Dupre.     She has RA followed by Dr. Estanislado Pandy and is  on Plaquenil and methotrexate.  I personally reviewed the CT scan of the head and the CT angiogram from 12/13/2017. The brain was normal for age. There is severe stenosis of the left subclavian artery. This appears to be distal to the vertebral artery origin.  More distal and in the head, there was no significant stenosis.   The right subclavian artery was fine.    Of note, there is spinal stenosis with significant spondylosis at C5-C6 resulting in severe foraminal narrowing at could lead to C6 nerve root compression there was milder degenerative changes at C4-C5 and C6-C7 also with foraminal narrowing.  REVIEW OF SYSTEMS: Constitutional: No fevers, chills, sweats, or change in appetite Eyes: No visual changes, double vision, eye pain Ear, nose and throat: No hearing loss, ear pain, nasal congestion, sore throat Cardiovascular: No chest pain, palpitations.  She has history of MI and has a pacemaker Respiratory: No shortness of breath at rest or with exertion.   No wheezes GastrointestinaI: No nausea, vomiting, diarrhea, abdominal pain, fecal incontinence Genitourinary: No dysuria, urinary retention or frequency.  No nocturia. Musculoskeletal: She has RA and reports pain that is worse in the hands and feet.  Integumentary: No rash, pruritus, skin lesions Neurological: as above Psychiatric: No depression at this time.  No anxiety Endocrine: No palpitations, diaphoresis, change in appetite,  change in weigh or increased thirst Hematologic/Lymphatic: No anemia, purpura, petechiae. Allergic/Immunologic: No itchy/runny eyes, nasal congestion, recent allergic reactions, rashes  ALLERGIES: Allergies  Allergen Reactions  . Betadine [Povidone Iodine] Shortness Of Breath  . Hydrocodone Itching    HOME MEDICATIONS:  Current Outpatient Medications:  .  acetaminophen (TYLENOL) 325 MG tablet, Take 2 tablets (650 mg total) by mouth every 4 (four) hours as needed for headache or mild pain., Disp: ,  Rfl:  .  albuterol (PROVENTIL HFA;VENTOLIN HFA) 108 (90 Base) MCG/ACT inhaler, Inhale into the lungs every 6 (six) hours as needed for wheezing or shortness of breath., Disp: , Rfl:  .  aspirin 81 MG tablet, Take 81 mg by mouth daily.  , Disp: , Rfl:  .  atorvastatin (LIPITOR) 40 MG tablet, Take 1 tablet (40 mg total) by mouth daily at 6 PM., Disp: 30 tablet, Rfl: 11 .  budesonide-formoterol (SYMBICORT) 160-4.5 MCG/ACT inhaler, Inhale 2 puffs into the lungs 2 (two) times daily., Disp: , Rfl:  .  calcium carbonate (OS-CAL) 600 MG TABS, Take 600 mg by mouth 2 (two) times daily with a meal.  , Disp: , Rfl:  .  carvedilol (COREG) 3.125 MG tablet, TAKE 1 TABLET(3.125 MG) BY MOUTH TWICE DAILY, Disp: 180 tablet, Rfl: 1 .  citalopram (CELEXA) 20 MG tablet, Take 20 mg by mouth daily., Disp: , Rfl: 3 .  clonazePAM (KLONOPIN) 0.5 MG tablet, Take 0.5 mg by mouth every 12 (twelve) hours as needed for anxiety., Disp: , Rfl: 0 .  diclofenac sodium (VOLTAREN) 1 % GEL, Apply 3 gm to 3 large joints up to 3 times a day.Dispense 3 tubes with 3 refills., Disp: 3 Tube, Rfl: 1 .  Ergocalciferol (VITAMIN D2) 2000 units TABS, Take 2,000 Units by mouth daily., Disp: , Rfl:  .  folic acid (FOLVITE) 1 MG tablet, TAKE ONE TABLET BY MOUTH ONCE DAILY, Disp: 90 tablet, Rfl: 4 .  hydroxychloroquine (PLAQUENIL) 200 MG tablet, TAKE 1 TABLET BY MOUTH TWICE DAILY MONDAY THROUGH FRIDAY, Disp: 120 tablet, Rfl: 0 .  losartan (COZAAR) 25 MG tablet, TAKE 1 TABLET(25 MG) BY MOUTH DAILY, Disp: 90 tablet, Rfl: 3 .  methotrexate (RHEUMATREX) 2.5 MG tablet, TAKE 6 TABLETS BY MOUTH ONCE WEEKLY* CAUTION CHEMOTHERAPY. PROTECT FROM LIGHT, Disp: 72 tablet, Rfl: 0 .  nitroGLYCERIN (NITROSTAT) 0.4 MG SL tablet, Place 1 tablet (0.4 mg total) under the tongue every 5 (five) minutes as needed for chest pain., Disp: 25 tablet, Rfl: 2 .  celecoxib (CELEBREX) 200 MG capsule, Take 1 capsule (200 mg total) by mouth 2 (two) times daily., Disp: 30 capsule, Rfl:  5 .  pantoprazole (PROTONIX) 40 MG tablet, Take 1 tablet (40 mg total) by mouth daily., Disp: 90 tablet, Rfl: 1  PAST MEDICAL HISTORY: Past Medical History:  Diagnosis Date  . Anxiety   . Asthma   . Breast cancer ([place])    left breast  . Carpal tunnel syndrome   . Depression   . Habitual alcohol use   . Heart attack ([place])   . Hypertension   . Personal history of chemotherapy   . Personal history of radiation therapy   . RA (rheumatoid arthritis) ([place])   . Spinal stenosis   . Vision abnormalities     PAST SURGICAL HISTORY: Past Surgical History:  Procedure Laterality Date  . BREAST BIOPSY    . BREAST LUMPECTOMY  2005   left  . CARDIAC CATHETERIZATION N/A 07/04/2016   Procedure: Left Heart Cath and Coronary  Angiography;  Surgeon: Belva Crome, MD;  Location: [place] [facility];  Service: Cardiovascular;  Laterality: N/A;  . CARDIAC CATHETERIZATION N/A 07/04/2016   Procedure: Temporary Pacemaker;  Surgeon: Belva Crome, MD;  Location: [place] [facility];  Service: Cardiovascular;  Laterality: N/A;  . EP IMPLANTABLE DEVICE N/A 07/05/2016   Procedure: Pacemaker Implant;  Surgeon: Evans Lance, MD;  Location: [place] [facility];  Service: Cardiovascular;  Laterality: N/A;  . [place]  . LYMPHECTOMY  2005    FAMILY HISTORY: Family History  Problem Relation Age of Onset  . Stroke Mother   . Cancer Father        colon?  . Pyelonephritis Sister   . Cancer Paternal Aunt   . Cancer Paternal Uncle     SOCIAL HISTORY:  Social History   Socioeconomic History  . Marital status: Widowed    Spouse name: Not on file  . Number of children: Not on file  . Years of education: Not on file  . Highest education level: Not on file  Social Needs  . Financial resource strain: Not on file  . Food insecurity - worry: Not on file  . Food insecurity - inability: Not on file  . Transportation needs - medical: Not on file  . Transportation needs - non-medical: Not on file   Occupational History  . Not on file  Tobacco Use  . Smoking status: Former Research scientist (life sciences)  . Smokeless tobacco: Never Used  . Tobacco comment: quit 1995  Substance and Sexual Activity  . Alcohol use: Yes    Alcohol/week: 1.2 - 1.8 oz    Types: 2 - 3 Cans of beer per week    Comment: 3 DAILY  . Drug use: No  . Sexual activity: Not on file  Other Topics Concern  . Not on file  Social History Narrative  . Not on file     PHYSICAL EXAM  Vitals:   01/07/18 1019  BP: 138/68  Pulse: 74  Resp: 18  Weight: 141 lb (64 kg)  Height: 5\' 2"  (1.575 m)    Body mass index is 25.79 kg/m.   General: The patient is well-developed and well-nourished and in no acute distress  Eyes:  Funduscopic exam shows normal optic discs and retinal vessels.  Neck: The neck is supple, no carotid bruits are noted.  The neck is nontender.  Cardiovascular: The heart has a regular rate and rhythm with a normal S1 and S2. There were no murmurs, gallops or rubs. Lungs are clear to auscultation.  Skin/extremities: Extremities are without rash or edema.   She has RA changes in hand joints RA  Musculoskeletal:  Back is nontender  Neurologic Exam  Mental status: The patient is alert and oriented x 3 at the time of the examination. The patient has apparent normal recent and remote memory, with an apparently normal attention span and concentration ability.   Speech is normal.  Cranial nerves: Extraocular movements are full. Pupils are equal, round, and reactive to light and accomodation.  Visual fields are full.  Facial symmetry is present. There is good facial sensation to soft touch bilaterally.Facial strength is normal.  Trapezius and sternocleidomastoid strength is normal. No dysarthria is noted.  The tongue is midline, and the patient has symmetric elevation of the soft palate. No obvious hearing deficits are noted. He right biceps and 4+/5 left biceps.   Strnegth is 4/5 in both triceps and in the EHL muscles of  the  feet.    Motor:  Muscle bulk is normal.   Tone is normal. Strength is  4/5 in   Sensory: Sensory testing is intact to pinprick, soft touch and vibration sensation in all 4 extremities.  Coordination: Cerebellar testing reveals good finger-nose-finger and heel-to-shin bilaterally.  Gait and station: Station is normal.   Gait is normal. Tandem gait is wide. Romberg is negative.   Reflexes: Deep tendon reflexes are symmetric and normal bilaterally.   Plantar responses are flexor.    DIAGNOSTIC DATA (LABS, IMAGING, TESTING) - I reviewed patient records, labs, notes, testing and imaging myself where available.  Lab Results  Component Value Date   WBC 5.4 12/13/2017   HGB 13.6 12/13/2017   HCT 40.5 12/13/2017   MCV 96.4 12/13/2017   PLT 231 12/13/2017      Component Value Date/Time   NA 138 12/13/2017 1152   K 4.3 12/13/2017 1152   CL 102 12/13/2017 1152   CO2 31 12/13/2017 1152   GLUCOSE 100 (H) 12/13/2017 1152   BUN 15 12/13/2017 1152   CREATININE 0.72 12/13/2017 1152   CREATININE 0.64 12/10/2017 1204   CALCIUM 9.3 12/13/2017 1152   PROT 6.6 12/13/2017 1152   ALBUMIN 3.5 12/13/2017 1152   AST 34 12/13/2017 1152   ALT 26 12/13/2017 1152   ALKPHOS 66 12/13/2017 1152   BILITOT 0.6 12/13/2017 1152   GFRNONAA >60 12/13/2017 1152   GFRNONAA 89 12/10/2017 1204   GFRAA >60 12/13/2017 1152   GFRAA 103 12/10/2017 1204   Lab Results  Component Value Date   CHOL 166 07/05/2016   HDL 54 07/05/2016   LDLCALC 94 07/05/2016   TRIG 88 07/05/2016   CHOLHDL 3.1 07/05/2016       ASSESSMENT AND PLAN  Stenosis of left subclavian artery (HCC)  Right arm pain  Cervical spinal stenosis  Cervical radiculopathy  Rheumatoid arthritis involving multiple sites with positive rheumatoid factor ([place])    In summary, [name] is a 72 year old woman can to experience right neck and arm discomfort and numbness one month ago. Her symptoms are due to stroke or arterial dissection.  Her CT scan does show acute arthritic changes maximal at C5-C6 and also at C4-C5 and C6-C7 with foraminal narrowing. There is potential for C6 nerve root compression.    I'll have her try Celebrex to see if that helps her pain. If this does not help enough, consider adding gabapentin and titrate as tolerated. She will call us back if she is not doing any better or if she notes new or worsening symptoms. Otherwise I will see her back in 2 months.  Thank you for asking me to see [name]. Please let me know positive be of further assistance with her or other patients in the future.  Richard A. Felecia Shelling, MD, [facility] 6/94/8546, 27:03 PM Certified in Neurology, Clinical Neurophysiology, Sleep Medicine, Pain Medicine and Neuroimaging  [facility] [address], [place], [place] [address] [phone]

## 2018-01-15 ENCOUNTER — Telehealth: Payer: Self-pay | Admitting: Neurology

## 2018-01-15 MED ORDER — GABAPENTIN 100 MG PO CAPS: ORAL_CAPSULE | ORAL | 1 refills | Status: DC

## 2018-01-15 NOTE — Telephone Encounter (Signed)
Spoke with Corleen who sts. Celebrex is not helping.  I have explained she may stop it and per RAS, offered Gabapentin 100mg  qam, 200mg  qhs.  She is agreeable.  I have advised titrating from 100mg  to 300mg  in order to avoid excessive drowsiness.  She is agreeable, will start at 100mg  qhs for a wk, then, if tolerating ok, increase to 200mg  qhs for a wk, then if tolerating ok, increase to 100mg  qam, and 200mg  qhs/fim

## 2018-01-15 NOTE — Telephone Encounter (Signed)
Flowella.  Per RAS, will  start Gabapentin for pain, low dose to make sure she tolerates ok,  and titrate if needed.  Gabapentin 100mg  QAM and 200mg  QHS/fim

## 2018-01-15 NOTE — Telephone Encounter (Signed)
Pt states celecoxib (CELEBREX) 200 MG capsule is not working.  It has made no difference .  Pt is asking to be called .

## 2018-01-15 NOTE — Addendum Note (Signed)
Addended by: France Ravens I on: 01/15/2018 04:40 PM   Modules accepted: Orders

## 2018-01-15 NOTE — Telephone Encounter (Signed)
Pt has called asking for a returned call from [employer]

## 2018-01-26 ENCOUNTER — Ambulatory Visit (INDEPENDENT_AMBULATORY_CARE_PROVIDER_SITE_OTHER): Payer: Medicare Other | Admitting: *Deleted

## 2018-01-26 ENCOUNTER — Telehealth: Payer: Self-pay | Admitting: Cardiology

## 2018-01-26 DIAGNOSIS — I442 Atrioventricular block, complete: Secondary | ICD-10-CM

## 2018-01-26 NOTE — Telephone Encounter (Signed)
LMOVM reminding pt to send remote transmission.   

## 2018-01-27 NOTE — Progress Notes (Signed)
Remote pacemaker transmission.   

## 2018-01-28 ENCOUNTER — Encounter: Payer: Self-pay | Admitting: Cardiology

## 2018-01-28 NOTE — Progress Notes (Signed)
Letter  

## 2018-01-29 LAB — CUP PACEART REMOTE DEVICE CHECK
Brady Statistic AP VS Percent: 0.02 %
Brady Statistic AS VP Percent: 99.91 %
Brady Statistic RA Percent Paced: 0.06 %
Brady Statistic RV Percent Paced: 99.91 %
Date Time Interrogation Session: 20190312022159
Implantable Lead Implant Date: 20170818
Implantable Lead Location: 753859
Implantable Lead Location: 753860
Implantable Lead Model: 5076
Lead Channel Impedance Value: 475 Ohm
Lead Channel Impedance Value: 532 Ohm
Lead Channel Impedance Value: 570 Ohm
Lead Channel Impedance Value: 646 Ohm
Lead Channel Impedance Value: 893 Ohm
Lead Channel Pacing Threshold Amplitude: 0.5 V
Lead Channel Pacing Threshold Pulse Width: 0.4 ms
Lead Channel Pacing Threshold Pulse Width: 0.4 ms
Lead Channel Sensing Intrinsic Amplitude: 2.25 mV
Lead Channel Sensing Intrinsic Amplitude: 7.25 mV
Lead Channel Setting Pacing Amplitude: 1.5 V
Lead Channel Setting Pacing Amplitude: 2 V
Lead Channel Setting Pacing Pulse Width: 0.4 ms
MDC IDC LEAD IMPLANT DT: 20170818
MDC IDC LEAD IMPLANT DT: 20170818
MDC IDC LEAD LOCATION: 753858
MDC IDC MSMT BATTERY REMAINING LONGEVITY: 79 mo
MDC IDC MSMT BATTERY VOLTAGE: 3.02 V
MDC IDC MSMT LEADCHNL LV IMPEDANCE VALUE: 589 Ohm
MDC IDC MSMT LEADCHNL LV PACING THRESHOLD AMPLITUDE: 0.5 V
MDC IDC MSMT LEADCHNL RA IMPEDANCE VALUE: 342 Ohm
MDC IDC MSMT LEADCHNL RA IMPEDANCE VALUE: 475 Ohm
MDC IDC MSMT LEADCHNL RA SENSING INTR AMPL: 2.25 mV
MDC IDC MSMT LEADCHNL RV IMPEDANCE VALUE: 513 Ohm
MDC IDC MSMT LEADCHNL RV PACING THRESHOLD AMPLITUDE: 0.5 V
MDC IDC MSMT LEADCHNL RV PACING THRESHOLD PULSEWIDTH: 0.4 ms
MDC IDC MSMT LEADCHNL RV SENSING INTR AMPL: 7.25 mV
MDC IDC PG IMPLANT DT: 20170818
MDC IDC SET LEADCHNL LV PACING PULSEWIDTH: 0.4 ms
MDC IDC SET LEADCHNL RA PACING AMPLITUDE: 1.5 V
MDC IDC SET LEADCHNL RV SENSING SENSITIVITY: 0.9 mV
MDC IDC STAT BRADY AP VP PERCENT: 0.04 %
MDC IDC STAT BRADY AS VS PERCENT: 0.03 %

## 2018-02-02 ENCOUNTER — Other Ambulatory Visit: Payer: Self-pay | Admitting: Rheumatology

## 2018-02-02 NOTE — Telephone Encounter (Signed)
Last visit: 12/10/2017 Next visit: 06/17/2018 Labs: 12/10/2017 WNL   Okay to refill per Dr. Estanislado Pandy.

## 2018-02-20 ENCOUNTER — Ambulatory Visit: Payer: Medicare Other | Admitting: Diagnostic Neuroimaging

## 2018-03-12 ENCOUNTER — Ambulatory Visit: Payer: Medicare Other | Admitting: Neurology

## 2018-03-12 ENCOUNTER — Other Ambulatory Visit: Payer: Self-pay

## 2018-03-12 ENCOUNTER — Encounter: Payer: Self-pay | Admitting: Neurology

## 2018-03-12 VITALS — BP 120/62 | HR 66 | Resp 16 | Ht 62.0 in | Wt 143.5 lb

## 2018-03-12 DIAGNOSIS — M79601 Pain in right arm: Secondary | ICD-10-CM | POA: Diagnosis not present

## 2018-03-12 DIAGNOSIS — M4802 Spinal stenosis, cervical region: Secondary | ICD-10-CM | POA: Diagnosis not present

## 2018-03-12 DIAGNOSIS — M5412 Radiculopathy, cervical region: Secondary | ICD-10-CM

## 2018-03-12 DIAGNOSIS — Z9889 Other specified postprocedural states: Secondary | ICD-10-CM

## 2018-03-12 DIAGNOSIS — M069 Rheumatoid arthritis, unspecified: Secondary | ICD-10-CM

## 2018-03-12 NOTE — Progress Notes (Signed)
[facility]  PATIENT: [name] DOB: 1946/02/10  REFERRING DOCTOR OR PCP:  [facility]) SOURCE:   _________________________________   HISTORICAL  CHIEF COMPLAINT:  Chief Complaint  Patient presents with  . Neck Pain    Sts. neck pain improved with Celebrex, but right arm numbness continued, so she stopped Celebrex and  started Gabapentin 100mg  qam and 200mg  qhs, but it is not helping either. Sts. the only thing she's found that helps is turning her head to the right./fim  . Right Arm Pain    HISTORY OF PRESENT ILLNESS:  [name] is a 72 yo woman with intermittent right-sided numbness an neck pain  Update 03/12/2018: She has less pain since starting Celebrex.   She continues to have numbness and tingling down the right arm to the wrist and sometimes into the hand.    She notes looking to the right reduces the numbness.    She notes weakness in both arms, right more than left.    Gabapentin did not help the tingling any.     She does not note any symptoms in her legs.    Bladder is ok.     She has had RA diagnosed 4 years ago..   She is on Plaquenil and methotrexate.    She has had bilateral carapl tunnel releases in the past.    Images from an older MRI dated 08/22/2011 were found and reviewed.  It does show severe multilevel degenerative changes, with moderately severe to severe spinal stenosis from C3-C4 to C5-C6 and she has moderate spinal stenosis at C6-C7.  There is various degrees of foraminal narrowing at these levels, though no definite nerve root compression.  From 01/07/2018  She had the onset of right neck and arm numbness 12/05/2017 associated mild pain in the entire arm.   She was concerned about a stroke and went to the ED.     In the ED, she had CT of the head and CT angiogram of the neck and head. There is no evidence of acute stroke. There is no evidence of arterial dissection. She did have significant stenosis in the left  subclavian artery without appears to be asymptomatic. Cervical spondylosis was reported.   Laboratory testing was fine.   She was referred for further evaluation.  Currently , she has no significant neck pain or numbness,   She has numbness and tingling between the elbow and shoulder associated with discomfort and sometimes pain and occasionally has radiation into the hands (all fingers).    She feels bladder function is unchanged.   She has had 2 x nocturia for many years.    She feels leg function is fine.   She feels balance is mildly worse over the last 3-4 years.   She has no recent falls.  Her hands and arms have felt weaker for the past 3 years.  She has no significant head or neck trauma.     She had an MI and is on ASA.   She has a pacemaker placed in 06/2016.   She sees Dr. Pernell Dupre.     She has RA followed by Dr. Estanislado Pandy and is on Plaquenil and methotrexate.  I personally reviewed the CT scan of the head and the CT angiogram from 12/13/2017. The brain was normal for age. There is severe stenosis of the left subclavian artery. This appears to be distal to the vertebral artery origin.  More distal and in the head, there was  no significant stenosis.   The right subclavian artery was fine.    Of note, there is spinal stenosis with significant spondylosis at C5-C6 resulting in severe foraminal narrowing at could lead to C6 nerve root compression there was milder degenerative changes at C4-C5 and C6-C7 also with foraminal narrowing.  REVIEW OF SYSTEMS: Constitutional: No fevers, chills, sweats, or change in appetite Eyes: No visual changes, double vision, eye pain Ear, nose and throat: No hearing loss, ear pain, nasal congestion, sore throat Cardiovascular: No chest pain, palpitations.  She has history of MI and has a pacemaker Respiratory: No shortness of breath at rest or with exertion.   No wheezes GastrointestinaI: No nausea, vomiting, diarrhea, abdominal pain, fecal  incontinence Genitourinary: No dysuria, urinary retention or frequency.  No nocturia. Musculoskeletal: She has RA and reports pain that is worse in the hands and feet.  Integumentary: No rash, pruritus, skin lesions Neurological: as above Psychiatric: No depression at this time.  No anxiety Endocrine: No palpitations, diaphoresis, change in appetite, change in weigh or increased thirst Hematologic/Lymphatic: No anemia, purpura, petechiae. Allergic/Immunologic: No itchy/runny eyes, nasal congestion, recent allergic reactions, rashes  ALLERGIES: Allergies  Allergen Reactions  . Betadine [Povidone Iodine] Shortness Of Breath  . Hydrocodone Itching    HOME MEDICATIONS:  Current Outpatient Medications:  .  acetaminophen (TYLENOL) 325 MG tablet, Take 2 tablets (650 mg total) by mouth every 4 (four) hours as needed for headache or mild pain., Disp: , Rfl:  .  albuterol (PROVENTIL HFA;VENTOLIN HFA) 108 (90 Base) MCG/ACT inhaler, Inhale into the lungs every 6 (six) hours as needed for wheezing or shortness of breath., Disp: , Rfl:  .  aspirin 81 MG tablet, Take 81 mg by mouth daily.  , Disp: , Rfl:  .  atorvastatin (LIPITOR) 40 MG tablet, Take 1 tablet (40 mg total) by mouth daily at 6 PM., Disp: 30 tablet, Rfl: 11 .  budesonide-formoterol (SYMBICORT) 160-4.5 MCG/ACT inhaler, Inhale 2 puffs into the lungs 2 (two) times daily., Disp: , Rfl:  .  calcium carbonate (OS-CAL) 600 MG TABS, Take 600 mg by mouth 2 (two) times daily with a meal.  , Disp: , Rfl:  .  carvedilol (COREG) 3.125 MG tablet, TAKE 1 TABLET(3.125 MG) BY MOUTH TWICE DAILY, Disp: 180 tablet, Rfl: 1 .  citalopram (CELEXA) 20 MG tablet, Take 20 mg by mouth daily., Disp: , Rfl: 3 .  clonazePAM (KLONOPIN) 0.5 MG tablet, Take 0.5 mg by mouth every 12 (twelve) hours as needed for anxiety., Disp: , Rfl: 0 .  diclofenac sodium (VOLTAREN) 1 % GEL, Apply 3 gm to 3 large joints up to 3 times a day.Dispense 3 tubes with 3 refills., Disp: 3  Tube, Rfl: 1 .  Ergocalciferol (VITAMIN D2) 2000 units TABS, Take 2,000 Units by mouth daily., Disp: , Rfl:  .  folic acid (FOLVITE) 1 MG tablet, TAKE ONE TABLET BY MOUTH ONCE DAILY, Disp: 90 tablet, Rfl: 4 .  gabapentin (NEURONTIN) 100 MG capsule, Take one tablet in the morning and two tablets at bedtime., Disp: 90 capsule, Rfl: 1 .  hydroxychloroquine (PLAQUENIL) 200 MG tablet, TAKE 1 TABLET BY MOUTH TWICE DAILY MONDAY THROUGH FRIDAY, Disp: 120 tablet, Rfl: 0 .  losartan (COZAAR) 25 MG tablet, TAKE 1 TABLET(25 MG) BY MOUTH DAILY, Disp: 90 tablet, Rfl: 3 .  methotrexate (RHEUMATREX) 2.5 MG tablet, TAKE 6 TABLETS BY MOUTH ONCE WEEKLY* CAUTION CHEMOTHERAPY. PROTECT FROM LIGHT, Disp: 72 tablet, Rfl: 0 .  nitroGLYCERIN (NITROSTAT) 0.4 MG SL  tablet, Place 1 tablet (0.4 mg total) under the tongue every 5 (five) minutes as needed for chest pain., Disp: 25 tablet, Rfl: 2 .  pantoprazole (PROTONIX) 40 MG tablet, Take 1 tablet (40 mg total) by mouth daily., Disp: 90 tablet, Rfl: 1  PAST MEDICAL HISTORY: Past Medical History:  Diagnosis Date  . Anxiety   . Asthma   . Breast cancer ([place])    left breast  . Carpal tunnel syndrome   . Depression   . Habitual alcohol use   . Heart attack ([place])   . Hypertension   . Personal history of chemotherapy   . Personal history of radiation therapy   . RA (rheumatoid arthritis) ([place])   . Spinal stenosis   . Vision abnormalities     PAST SURGICAL HISTORY: Past Surgical History:  Procedure Laterality Date  . BREAST BIOPSY    . BREAST LUMPECTOMY  2005   left  . CARDIAC CATHETERIZATION N/A 07/04/2016   Procedure: Left Heart Cath and Coronary Angiography;  Surgeon: Belva Crome, MD;  Location: [facility];  Service: Cardiovascular;  Laterality: N/A;  . CARDIAC CATHETERIZATION N/A 07/04/2016   Procedure: Temporary Pacemaker;  Surgeon: Belva Crome, MD;  Location: [facility];  Service: Cardiovascular;  Laterality: N/A;  . EP IMPLANTABLE DEVICE  N/A 07/05/2016   Procedure: Pacemaker Implant;  Surgeon: Evans Lance, MD;  Location: [facility];  Service: Cardiovascular;  Laterality: N/A;  . [place]  . LYMPHECTOMY  2005    FAMILY HISTORY: Family History  Problem Relation Age of Onset  . Stroke Mother   . Cancer Father        colon?  . Pyelonephritis Sister   . Cancer Paternal Aunt   . Cancer Paternal Uncle     SOCIAL HISTORY:  Social History   Socioeconomic History  . Marital status: Widowed    Spouse name: Not on file  . Number of children: Not on file  . Years of education: Not on file  . Highest education level: Not on file  Occupational History  . Not on file  Social Needs  . Financial resource strain: Not on file  . Food insecurity:    Worry: Not on file    Inability: Not on file  . Transportation needs:    Medical: Not on file    Non-medical: Not on file  Tobacco Use  . Smoking status: Former Research scientist (life sciences)  . Smokeless tobacco: Never Used  . Tobacco comment: quit 1995  Substance and Sexual Activity  . Alcohol use: Yes    Alcohol/week: 1.2 - 1.8 oz    Types: 2 - 3 Cans of beer per week    Comment: 3 DAILY  . Drug use: No  . Sexual activity: Not on file  Lifestyle  . Physical activity:    Days per week: Not on file    Minutes per session: Not on file  . Stress: Not on file  Relationships  . Social connections:    Talks on phone: Not on file    Gets together: Not on file    Attends religious service: Not on file    Active member of club or organization: Not on file    Attends meetings of clubs or organizations: Not on file    Relationship status: Not on file  . Intimate partner violence:    Fear of current or ex partner: Not on file    Emotionally abused: Not on  file    Physically abused: Not on file    Forced sexual activity: Not on file  Other Topics Concern  . Not on file  Social History Narrative  . Not on file     PHYSICAL EXAM  Vitals:   03/12/18 1307  BP: 120/62   Pulse: 66  Resp: 16  Weight: 143 lb 8 oz (65.1 kg)  Height: 5\' 2"  (1.575 m)    Body mass index is 26.25 kg/m.   General: The patient is well-developed and well-nourished and in no acute distress  Eyes:  Funduscopic exam shows normal optic discs and retinal vessels.  Neck: The neck is supple, no carotid bruits are noted.  The neck is nontender.  Cardiovascular: The heart has a regular rate and rhythm with a normal S1 and S2. There were no murmurs, gallops or rubs. Lungs are clear to auscultation.  Skin/extremities: Extremities are without rash or edema.   She has RA changes in hand joints RA  Musculoskeletal:  Back is nontender  Neurologic Exam  Mental status: The patient is alert and oriented x 3 at the time of the examination. The patient has apparent normal recent and remote memory, with an apparently normal attention span and concentration ability.   Speech is normal.  Cranial nerves: Extraocular movements are full. . There is good facial sensation to soft touch bilaterally.Facial strength is normal.  Trapezius and sternocleidomastoid strength is normal. No dysarthria is noted.  The tongue is midline, and the patient has symmetric elevation of the soft palate. No obvious hearing deficits are noted.    Motor:  Muscle bulk is normal.   Tone is normal. Strength is  4/5 in left biceps     4/5 in right triceps    3/5 right APB and right ulnar innervated and 4+/5 in left APb and 4/5 in left ulnar innervated hand muscles.  5/5 both deltoid.   Strength is 4/5 in both triceps and in the EHL muscles of the feet.     Sensory: Sensory testing is intact to pinprick, soft touch and vibration sensation in all 4 extremities.  Coordination: Cerebellar testing reveals good finger-nose-finger and heel-to-shin bilaterally.  Gait and station: Station is normal.   Gait is normal. Tandem gait is wide. Romberg is negative.   Reflexes: Deep tendon reflexes are symmetric and normal bilaterally.    Plantar responses are flexor.    DIAGNOSTIC DATA (LABS, IMAGING, TESTING) - I reviewed patient records, labs, notes, testing and imaging myself where available.  Lab Results  Component Value Date   WBC 5.4 12/13/2017   HGB 13.6 12/13/2017   HCT 40.5 12/13/2017   MCV 96.4 12/13/2017   PLT 231 12/13/2017      Component Value Date/Time   NA 138 12/13/2017 1152   K 4.3 12/13/2017 1152   CL 102 12/13/2017 1152   CO2 31 12/13/2017 1152   GLUCOSE 100 (H) 12/13/2017 1152   BUN 15 12/13/2017 1152   CREATININE 0.72 12/13/2017 1152   CREATININE 0.64 12/10/2017 1204   CALCIUM 9.3 12/13/2017 1152   PROT 6.6 12/13/2017 1152   ALBUMIN 3.5 12/13/2017 1152   AST 34 12/13/2017 1152   ALT 26 12/13/2017 1152   ALKPHOS 66 12/13/2017 1152   BILITOT 0.6 12/13/2017 1152   GFRNONAA >60 12/13/2017 1152   GFRNONAA 89 12/10/2017 1204   GFRAA >60 12/13/2017 1152   GFRAA 103 12/10/2017 1204   Lab Results  Component Value Date   CHOL 166 07/05/2016  HDL 54 07/05/2016   LDLCALC 94 07/05/2016   TRIG 88 07/05/2016   CHOLHDL 3.1 07/05/2016       ASSESSMENT AND PLAN  Cervical radiculopathy  History of bilateral carpal tunnel release  Cervical spinal stenosis  Right arm pain   1.    Her right arm pain is likely due to her severe level degenerative changes in her neck.  She does not show evidence of spinal cord compression though in 2012 had multilevel spinal stenosis. 2.    Due to a pacemaker, I will hold off on obtaining an MRI until I get clearance from cardiology. 3.    We will check an NCV/EMG to help to determine if the right arm numbness and weakness is due entirely to her neck degenerative changes or if there are superimposed neuropathies 4.    I will see her back when she has her EMG and she should call us sooner if she has new or worsening neurologic symptoms.     Richard A. Felecia Shelling, MD, [facility] 02/24/8118, 1:47 PM Certified in Neurology, Clinical Neurophysiology, Sleep Medicine,  Pain Medicine and Neuroimaging  [facility] [address], [place], [place] [address] [phone]

## 2018-03-24 ENCOUNTER — Other Ambulatory Visit: Payer: Self-pay | Admitting: Rheumatology

## 2018-03-24 NOTE — Telephone Encounter (Addendum)
Last visit: 12/10/2017 Next visit: 06/17/2018 Labs: 12/10/2017 WNL  Patient advised she is due to update labs and states she will update labs before the end of the month.   Okay to refill 30 day supply per Dr. Estanislado Pandy

## 2018-03-26 ENCOUNTER — Telehealth: Payer: Self-pay | Admitting: *Deleted

## 2018-03-26 NOTE — Telephone Encounter (Signed)
LMOM that she is not able to have an MRI since she has the pacemaker; and to please call if she has questions/fim

## 2018-03-26 NOTE — Telephone Encounter (Signed)
-----   Message from Britt Bottom, MD sent at 03/25/2018  7:02 PM EDT ----- Regarding: RE: pacemaker and MRI [name], Please let her know that her pacemaker is not compatible with an MRI.  If the EMG/nerve conduction study shows that she has active radiculopathies, we will need to consider doing a CT myelogram.  Richard ----- Message ----- From: Belva Crome, MD Sent: 03/20/2018   9:59 AM To: Britt Bottom, MD Subject: FW: pacemaker and MRI                          Please note pace is not MRI compatible.  HS ----- Message ----- From: Evans Lance, MD Sent: 03/17/2018   1:25 PM To: Belva Crome, MD Subject: RE: pacemaker and MRI                          No it is not. Sorry. GT ----- Message ----- From: Belva Crome, MD Sent: 03/14/2018   4:49 PM To: Evans Lance, MD, Britt Bottom, MD Subject: RE: pacemaker and MRI                          [name],  Is [name] able to undergo MRI imaging with her pacer?  Hank ----- Message ----- From: Britt Bottom, MD Sent: 03/12/2018   1:41 PM To: Belva Crome, MD Subject: pacemaker and MRI                              Dr. Tamala Julian,   [name] is a mutual patient.  She has known moderately severe spinal stenosis seen on MRI in 2012 and newer onset numbness and weakness in the right arm.  I would like to obtain an MRI to better characterize.    A couple years ago she had a pacemaker placed.  Is her pacemaker MRI compatible.  If so, would she need to have it turned off prior to imaging and then reset?    Thank you, Delfino Lovett

## 2018-04-08 ENCOUNTER — Other Ambulatory Visit: Payer: Self-pay | Admitting: *Deleted

## 2018-04-08 ENCOUNTER — Encounter: Payer: Self-pay | Admitting: Nurse Practitioner

## 2018-04-08 ENCOUNTER — Other Ambulatory Visit: Payer: Self-pay

## 2018-04-08 ENCOUNTER — Ambulatory Visit: Payer: Medicare Other | Admitting: Nurse Practitioner

## 2018-04-08 VITALS — BP 130/72 | HR 70 | Ht 62.0 in | Wt 142.4 lb

## 2018-04-08 DIAGNOSIS — I2510 Atherosclerotic heart disease of native coronary artery without angina pectoris: Secondary | ICD-10-CM | POA: Diagnosis not present

## 2018-04-08 DIAGNOSIS — Z79899 Other long term (current) drug therapy: Secondary | ICD-10-CM

## 2018-04-08 DIAGNOSIS — I255 Ischemic cardiomyopathy: Secondary | ICD-10-CM

## 2018-04-08 DIAGNOSIS — I447 Left bundle-branch block, unspecified: Secondary | ICD-10-CM | POA: Diagnosis not present

## 2018-04-08 DIAGNOSIS — I10 Essential (primary) hypertension: Secondary | ICD-10-CM

## 2018-04-08 DIAGNOSIS — Z9861 Coronary angioplasty status: Secondary | ICD-10-CM

## 2018-04-08 MED ORDER — NITROGLYCERIN 0.4 MG SL SUBL: 0.4000 mg | SUBLINGUAL_TABLET | SUBLINGUAL | 2 refills | Status: DC | PRN

## 2018-04-08 NOTE — Patient Instructions (Addendum)
We will be checking the following labs today - NONE   Medication Instructions:    Continue with your current medicines. BUT  We are stopping the Coreg today.     Testing/Procedures To Be Arranged:  N/A  Follow-Up:   See Dr. Tamala Julian in one year    Other Special Instructions:   N/A    If you need a refill on your cardiac medications before your next appointment, please call your pharmacy.   Call the [place] office at [phone] if you have any questions, problems or concerns.

## 2018-04-08 NOTE — Progress Notes (Signed)
CARDIOLOGY OFFICE NOTE  Date:  04/08/2018    [name] Date of Birth: Apr 08, 1946 Medical Record #[id number]  PCP:  Darcus Austin, MD  Cardiologist:  Smith/Taylor  Chief Complaint  Patient presents with  . Coronary Artery Disease    1 year check - seen for Dr. Tamala Julian    History of Present Illness: [name] is a 72 y.o. female who presents today for a 1 year check. Seen for Dr. Tamala Julian. She follows with Dr. Lovena Le with EP.   She has a history of breast Ca, HTN, RA, depression and CAD s/p thrombotic occlusion of the LAD s/p thrombectomy (07/04/16) with associated VT and CHB s/p BiV PPM and chronic systolic HF.  She was last seen a year ago by Dr. Tamala Julian - she was doing well.   Comes in today. Here alone. She admits initially that she has had tremendous stress. Her husband died back in 09/30/23 with metastatic brain cancer. Her son was involved in a motorcycle accident a week ago - he was airlifted to [facility] - now at her house recuperating. She otherwise is doing well. Notes more issues with asthma/wheezing. No swelling. Not felt to be having heart failure. She remains active with her exercise program. She was in the ER in January for acute neck pain. This has resolved. She is asking about stopping Coreg based on her visit last with Dr. Tamala Julian.   Past Medical History:  Diagnosis Date  . Anxiety   . Asthma   . Breast cancer ([place])    left breast  . Carpal tunnel syndrome   . Depression   . Habitual alcohol use   . Heart attack ([place])   . Hypertension   . Personal history of chemotherapy   . Personal history of radiation therapy   . RA (rheumatoid arthritis) ([place])   . Spinal stenosis   . Vision abnormalities     Past Surgical History:  Procedure Laterality Date  . BREAST BIOPSY    . BREAST LUMPECTOMY  2005   left  . CARDIAC CATHETERIZATION N/A 07/04/2016   Procedure: Left Heart Cath and Coronary Angiography;  Surgeon: Belva Crome, MD;  Location: [place] [facility];  Service: Cardiovascular;  Laterality: N/A;  . CARDIAC CATHETERIZATION N/A 07/04/2016   Procedure: Temporary Pacemaker;  Surgeon: Belva Crome, MD;  Location: [place] [facility];  Service: Cardiovascular;  Laterality: N/A;  . EP IMPLANTABLE DEVICE N/A 07/05/2016   Procedure: Pacemaker Implant;  Surgeon: Evans Lance, MD;  Location: [place] [facility];  Service: Cardiovascular;  Laterality: N/A;  . [place]  . LYMPHECTOMY  2005     Medications: Current Meds  Medication Sig  . acetaminophen (TYLENOL) 325 MG tablet Take 2 tablets (650 mg total) by mouth every 4 (four) hours as needed for headache or mild pain.  Marland Kitchen albuterol (PROVENTIL HFA;VENTOLIN HFA) 108 (90 Base) MCG/ACT inhaler Inhale into the lungs every 6 (six) hours as needed for wheezing or shortness of breath.  Marland Kitchen aspirin 81 MG tablet Take 81 mg by mouth daily.    Marland Kitchen atorvastatin (LIPITOR) 40 MG tablet Take 1 tablet (40 mg total) by mouth daily at 6 PM.  . budesonide-formoterol (SYMBICORT) 160-4.5 MCG/ACT inhaler Inhale 2 puffs into the lungs 2 (two) times daily.  . calcium carbonate (OS-CAL) 600 MG TABS Take 600 mg by mouth 2 (two) times daily with a meal.    . citalopram (CELEXA) 20 MG tablet Take  20 mg by mouth daily.  . clonazePAM (KLONOPIN) 0.5 MG tablet Take 0.5 mg by mouth every 12 (twelve) hours as needed for anxiety.  . diclofenac sodium (VOLTAREN) 1 % GEL Apply 3 gm to 3 large joints up to 3 times a day.Dispense 3 tubes with 3 refills.  . Ergocalciferol (VITAMIN D2) 2000 units TABS Take 2,000 Units by mouth daily.  . folic acid (FOLVITE) 1 MG tablet TAKE ONE TABLET BY MOUTH ONCE DAILY  . hydroxychloroquine (PLAQUENIL) 200 MG tablet TAKE 1 TABLET BY MOUTH TWICE DAILY MONDAY THROUGH FRIDAY  . losartan (COZAAR) 25 MG tablet TAKE 1 TABLET(25 MG) BY MOUTH DAILY  . methotrexate (RHEUMATREX) 2.5 MG tablet TAKE 6 TABLETS BY MOUTH ONCE WEEKLY* CAUTION CHEMOTHERAPY. PROTECT FROM LIGHT  . nitroGLYCERIN  (NITROSTAT) 0.4 MG SL tablet Place 1 tablet (0.4 mg total) under the tongue every 5 (five) minutes as needed for chest pain.  . pantoprazole (PROTONIX) 40 MG tablet Take 1 tablet (40 mg total) by mouth daily.  . [DISCONTINUED] carvedilol (COREG) 3.125 MG tablet TAKE 1 TABLET(3.125 MG) BY MOUTH TWICE DAILY     Allergies: Allergies  Allergen Reactions  . Betadine [Povidone Iodine] Shortness Of Breath  . Hydrocodone Itching    Social History: The patient  reports that she has quit smoking. She has never used smokeless tobacco. She reports that she drinks about 1.2 - 1.8 oz of alcohol per week. She reports that she does not use drugs.   Family History: The patient's family history includes Cancer in her father, paternal aunt, and paternal uncle; Pyelonephritis in her sister; Stroke in her mother.   Review of Systems: Please see the history of present illness.   Otherwise, the review of systems is positive for none.   All other systems are reviewed and negative.   Physical Exam: VS:  BP 130/72 (BP Location: Right Arm, Patient Position: Sitting, Cuff Size: Normal)   Pulse 70   Ht 5\' 2"  (1.575 m)   Wt 142 lb 6.4 oz (64.6 kg)   SpO2 96% Comment: at rest  BMI 26.05 kg/m  .  BMI Body mass index is 26.05 kg/m.  Wt Readings from Last 3 Encounters:  04/08/18 142 lb 6.4 oz (64.6 kg)  03/12/18 143 lb 8 oz (65.1 kg)  01/07/18 141 lb (64 kg)    General: Pleasant. Well developed, well nourished and in no acute distress.   HEENT: Normal.  Neck: Supple, no JVD, carotid bruits, or masses noted.  Cardiac: Regular rate and rhythm. No murmurs, rubs, or gallops. No edema.  Respiratory:  Lungs are clear to auscultation bilaterally with normal work of breathing.  GI: Soft and nontender.  MS: No deformity or atrophy. Gait and ROM intact.  Skin: Warm and dry. Color is normal.  Neuro:  Strength and sensation are intact and no gross focal deficits noted.  Psych: Alert, appropriate and with normal  affect.   LABORATORY DATA:  EKG:  EKG is not ordered today.  Lab Results  Component Value Date   WBC 5.4 12/13/2017   HGB 13.6 12/13/2017   HCT 40.5 12/13/2017   PLT 231 12/13/2017   GLUCOSE 100 (H) 12/13/2017   CHOL 166 07/05/2016   TRIG 88 07/05/2016   HDL 54 07/05/2016   LDLCALC 94 07/05/2016   ALT 26 12/13/2017   AST 34 12/13/2017   NA 138 12/13/2017   K 4.3 12/13/2017   CL 102 12/13/2017   CREATININE 0.72 12/13/2017   BUN 15 12/13/2017  CO2 31 12/13/2017   INR 0.98 12/13/2017       BNP (last 3 results) No results for input(s): BNP in the last 8760 hours.  ProBNP (last 3 results) No results for input(s): PROBNP in the last 8760 hours.   Other Studies Reviewed Today:  Echo Study Conclusions 04/2017  - Left ventricle: The cavity size was normal. Wall thickness was   normal. Systolic function was normal. The estimated ejection   fraction was in the range of 55% to 60%. Wall motion was normal;   there were no regional wall motion abnormalities. Doppler   parameters are consistent with abnormal left ventricular   relaxation (grade 1 diastolic dysfunction). - Mitral valve: Moderately calcified annulus. - Left atrium: The atrium was mildly dilated.    Cardiac cath and temporary pacemaker report, 07/04/16:  High-grade AV block with syncope and polymorphic ventricular tachycardia (nonsustained - possibly bradycardia dependent). Heavy mitral annular calcification suggests that AV block is chronic and degenerative in nature. The patient will likely require a permanent pacemaker.  Temporary pacemaker implantation via right femoral approach.  Total occlusion of the mid LAD likely related to a thromboembolic event complicating catheterization. Onset of chest discomfort was temporarily related to the first left coronary angiogram.  Successful catheter-based thrombectomy of the mid LAD reducing 100% stenosis to less than 10% with TIMI grade 3 flow. Stenting was not  required.  Otherwise widely patent/normal coronary arteries.  Mid anterior wall hypokinesis with EF 40-50%. (Interestingly, the ventriculogram was performed prior to left coronary angiography).    Assessment/Plan:  1. CAD with prior LAD thrombectomy - she is doing well clinically - no symptoms. Favor continued CV risk factor modification.   2. Ischemic CM - EF has recovered by echo from 04/2017 -  Will stop her Coreg - this may help her asthma/wheezing. She knows to look for swelling, weight gain, worsening dyspnea, etc.   3. Underlying PPM - followed by EP - to see at the end of this summer.   4. HTN - BP is fine on her current regimen.   5. HLD - on statin - labs from PCP noted by the [facility].   6. RA  Current medicines are reviewed with the patient today.  The patient does not have concerns regarding medicines other than what has been noted above.  The following changes have been made:  See above.  Labs/ tests ordered today include:   No orders of the defined types were placed in this encounter.    Disposition:   FU with Dr. Tamala Julian in one year. See Dr. Lovena Le as planned.    Patient is agreeable to this plan and will call if any problems develop in the interim.   SignedTruitt Merle, NP  04/08/2018 9:43 AM  [facility] [address] [place], [place]  [address] Phone: [phone] Fax: [phone]

## 2018-04-09 LAB — COMPLETE METABOLIC PANEL WITH GFR
AG RATIO: 1.6 (calc) (ref 1.0–2.5)
ALT: 21 U/L (ref 6–29)
AST: 31 U/L (ref 10–35)
Albumin: 3.6 g/dL (ref 3.6–5.1)
Alkaline phosphatase (APISO): 70 U/L (ref 33–130)
BUN/Creatinine Ratio: 21 (calc) (ref 6–22)
BUN: 12 mg/dL (ref 7–25)
CALCIUM: 8.8 mg/dL (ref 8.6–10.4)
CO2: 32 mmol/L (ref 20–32)
Chloride: 102 mmol/L (ref 98–110)
Creat: 0.58 mg/dL — ABNORMAL LOW (ref 0.60–0.93)
GFR, EST AFRICAN AMERICAN: 105 mL/min/{1.73_m2} (ref >=60)
GFR, EST NON AFRICAN AMERICAN: 91 mL/min/{1.73_m2} (ref >=60)
GLOBULIN: 2.3 g/dL (ref 1.9–3.7)
Glucose, Bld: 98 mg/dL (ref 65–99)
POTASSIUM: 4.4 mmol/L (ref 3.5–5.3)
Sodium: 139 mmol/L (ref 135–146)
Total Bilirubin: 0.5 mg/dL (ref 0.2–1.2)
Total Protein: 5.9 g/dL — ABNORMAL LOW (ref 6.1–8.1)

## 2018-04-09 LAB — CBC WITH DIFFERENTIAL/PLATELET
BASOS ABS: 73 {cells}/uL (ref 0–200)
Basophils Relative: 1.1 %
EOS ABS: 515 {cells}/uL — AB (ref 15–500)
Eosinophils Relative: 7.8 %
HCT: 38.6 % (ref 35.0–45.0)
Hemoglobin: 13.5 g/dL (ref 11.7–15.5)
Lymphs Abs: 1129 cells/uL (ref 850–3900)
MCH: 32.5 pg (ref 27.0–33.0)
MCHC: 35 g/dL (ref 32.0–36.0)
MCV: 93 fL (ref 80.0–100.0)
MONOS PCT: 11.9 %
MPV: 9.4 fL (ref 7.5–12.5)
NEUTROS PCT: 62.1 %
Neutro Abs: 4099 cells/uL (ref 1500–7800)
PLATELETS: 257 10*3/uL (ref 140–400)
RBC: 4.15 10*6/uL (ref 3.80–5.10)
RDW: 12.9 % (ref 11.0–15.0)
TOTAL LYMPHOCYTE: 17.1 %
WBC mixed population: 785 cells/uL (ref 200–950)
WBC: 6.6 10*3/uL (ref 3.8–10.8)

## 2018-04-14 ENCOUNTER — Ambulatory Visit (INDEPENDENT_AMBULATORY_CARE_PROVIDER_SITE_OTHER): Payer: Medicare Other | Admitting: Neurology

## 2018-04-14 ENCOUNTER — Ambulatory Visit: Payer: Medicare Other | Admitting: Neurology

## 2018-04-14 DIAGNOSIS — G5603 Carpal tunnel syndrome, bilateral upper limbs: Secondary | ICD-10-CM | POA: Diagnosis not present

## 2018-04-14 DIAGNOSIS — M5412 Radiculopathy, cervical region: Secondary | ICD-10-CM

## 2018-04-14 DIAGNOSIS — M79601 Pain in right arm: Secondary | ICD-10-CM

## 2018-04-14 DIAGNOSIS — Z0289 Encounter for other administrative examinations: Secondary | ICD-10-CM

## 2018-04-14 DIAGNOSIS — M4802 Spinal stenosis, cervical region: Secondary | ICD-10-CM

## 2018-04-14 DIAGNOSIS — Z9889 Other specified postprocedural states: Secondary | ICD-10-CM

## 2018-04-14 MED ORDER — METHYLPREDNISOLONE 4 MG PO TABS: ORAL_TABLET | ORAL | 0 refills | Status: DC

## 2018-04-14 NOTE — Progress Notes (Signed)
Full Name: [name] Gender: Female MRN #: [id number] Date of Birth: 05-30-1946    Visit Date: 04/14/2018 13:43 Age: 72 Years 2 Months Old Examining Physician: Arlice Colt, MD  Referring Physician: Felecia Shelling, MD    History:  [name] is a 72 year old woman with neck pain and right arm pain.  She has a history of carpal tunnel surgery in the past.  Nerve conduction studies: The left median motor response had normal latency and amplitude and minimally reduced conduction velocity.  The right median motor response had a borderline normal distal latency, reduced amplitude and reduced velocity.  Bilateral ulnar motor responses were essentially normal.  Bilateral median sensory responses were slowed across the wrist, right worse than left with reduced amplitudes, right worse than left.  Bilateral ulnar and bilateral radial sensory responses were normal.  Needle EMG: The right deltoids showed mild chronic denervation and minimal superimposed acute denervation.  The right biceps was normal.  The right triceps showed moderate chronic denervation with minimal superimposed acute denervation.   There was mild chronic denervation without acute denervation in the pronator teres, flexor carpi ulnaris and Abductor pollicis brevis.  The first dorsal interosseous muscle was essentially normal.  Impression: This NCV/EMG study shows the following: 1.    Moderate chronic C6 radiculopathy with mild superimposed acute denervation. 2.    Milder chronic C7 radiculopathy. 3.    A mild right C8 radiculopathy cannot be ruled out.   4.    Bilateral moderate median neuropathies at the wrist, right worse than left.   Caidence Kaseman A. Felecia Shelling, MD, PhD, FAAN Certified in Neurology, Clinical Neurophysiology, Sleep Medicine, Pain Medicine and Neuroimaging Director, [place] [facility] [address] [place], [place] [address] [phone]  Clinical Note: Despite conservative therapy, she is still experiencing a lot of discomfort and pain in the neck and right arm.  She has evidence of acute on chronic C6 and chronic C7 radiculopathies on the right.  CT scan shows multilevel severe degenerative changes with spinal stenosis at C5-C6 and advanced foraminal narrowing at that level and moderate foraminal narrowing at C4-C5 and C6-C7.  She has retrolisthesis at C4-C5.  Since she has failed conservative therapy, I will refer to neurosurgery.  I discussed that she will most likely need a CT myelogram (cannot get an MRI due to noncompatible pacemaker) and that an epidural steroid injection could also be considered.   Although she does have carpal tunnel syndrome, I believe that the cervical radiculopathies are causing the vast majority of her pain. -- RAS       MNC    Nerve / Sites Muscle Latency Ref. Amplitude Ref. Rel Amp Segments Distance Velocity Ref. Area    ms ms mV mV %  cm m/s m/s mVms  L Median - APB     Wrist APB 3.8 ?4.4 6.0 ?4.0 100 Wrist - APB 7   16.4     Upper arm APB 8.4  5.4  90.8 Upper arm - Wrist 21 45 ?49 15.6  R Median - APB     Wrist APB 4.2 ?4.4 3.7 ?4.0 100 Wrist - APB 7   10.3     Upper arm APB 9.4  3.3  89.6 Upper arm - Wrist 21 40 ?49 9.9  L Ulnar - ADM     Wrist ADM 3.2 ?3.3 8.6 ?6.0 100 Wrist - ADM 7   25.2  B.Elbow ADM 7.0  8.2  94.9 B.Elbow - Wrist 19 50 ?49 25.9     A.Elbow ADM 8.9  7.6  92.3 A.Elbow - B.Elbow 10 53 ?49 26.0         A.Elbow - Wrist      R Ulnar - ADM     Wrist ADM 3.3 ?3.3 6.1 ?6.0 100 Wrist - ADM 7   16.3     B.Elbow ADM 7.0  5.4  88.4 B.Elbow - Wrist 19 51 ?49 15.7     A.Elbow ADM 9.0  4.9  90.8 A.Elbow - B.Elbow 10 49 ?49 14.8         A.Elbow - Wrist                 SNC    Nerve / Sites Rec. Site Peak Lat Ref.  Amp Ref. Segments Distance    ms ms V V  cm  L Radial - Anatomical snuff box (Forearm)     Forearm Wrist 2.8 ?2.9 11 ?15 Forearm - Wrist 10  R Radial -  Anatomical snuff box (Forearm)     Forearm Wrist 2.5 ?2.9 8 ?15 Forearm - Wrist 10  L Median - Orthodromic (Dig II, Mid palm)     Dig II Wrist 3.6 ?3.4 1 ?10 Dig II - Wrist 13  R Median - Orthodromic (Dig II, Mid palm)     Dig II Wrist 4.0 ?3.4 3 ?10 Dig II - Wrist 13  L Ulnar - Orthodromic, (Dig V, Mid palm)     Dig V Wrist 2.9 ?3.1 7 ?5 Dig V - Wrist 11  R Ulnar - Orthodromic, (Dig V, Mid palm)     Dig V Wrist 2.8 ?3.1 6 ?5 Dig V - Wrist 4                  F  Wave    Nerve F Lat Ref.   ms ms  L Ulnar - ADM 30.3 ?32.0  R Ulnar - ADM 31.3 ?32.0         EMG full       EMG Summary Table    Spontaneous MUAP Recruitment  Muscle IA Fib PSW Fasc Other Amp Dur. Poly Pattern  R. Deltoid Normal 1+ None None _______ Normal Normal 1+ Reduced  R. Triceps brachii Normal 1+ None None _______ Increased Increased 2+ Discrete  R. Biceps brachii Normal None None None _______ Normal Normal Normal Normal  R. Extensor digitorum communis Normal None None None _______ Increased Increased 1+ Reduced  R. Pronator teres Normal None None None _______ Increased Increased 1+ Reduced  R. Flexor carpi ulnaris Normal None None None _______ Normal Normal 1+ Reduced  R. First dorsal interosseous Normal None None None _______ Normal Normal Trace Normal  R. Abductor pollicis brevis Normal None None None _______ Normal Normal 1+ Reduced

## 2018-04-20 ENCOUNTER — Other Ambulatory Visit: Payer: Self-pay | Admitting: Rheumatology

## 2018-04-20 NOTE — Telephone Encounter (Signed)
Last visit: 12/10/2017 Next visit: 06/17/2018 Labs:04/08/18 stable  Okay to refill per Dr. Deveshwar  

## 2018-04-27 ENCOUNTER — Ambulatory Visit (INDEPENDENT_AMBULATORY_CARE_PROVIDER_SITE_OTHER): Payer: Medicare Other | Admitting: *Deleted

## 2018-04-27 ENCOUNTER — Telehealth: Payer: Self-pay

## 2018-04-27 DIAGNOSIS — I255 Ischemic cardiomyopathy: Secondary | ICD-10-CM

## 2018-04-27 DIAGNOSIS — I442 Atrioventricular block, complete: Secondary | ICD-10-CM

## 2018-04-27 DIAGNOSIS — I5020 Unspecified systolic (congestive) heart failure: Secondary | ICD-10-CM

## 2018-04-27 NOTE — Telephone Encounter (Signed)
LMOVM reminding pt to send remote transmission.   

## 2018-04-28 ENCOUNTER — Encounter: Payer: Self-pay | Admitting: Cardiology

## 2018-04-28 LAB — CUP PACEART REMOTE DEVICE CHECK
Battery Voltage: 3.02 V
Brady Statistic AP VS Percent: 0.02 %
Brady Statistic AS VP Percent: 99.86 %
Brady Statistic AS VS Percent: 0.08 %
Brady Statistic RA Percent Paced: 0.06 %
Implantable Lead Implant Date: 20170818
Implantable Lead Implant Date: 20170818
Implantable Lead Location: 753858
Implantable Lead Location: 753860
Implantable Lead Model: 4396
Implantable Lead Model: 5076
Implantable Lead Model: 5076
Implantable Pulse Generator Implant Date: 20170818
Lead Channel Impedance Value: 361 Ohm
Lead Channel Impedance Value: 494 Ohm
Lead Channel Impedance Value: 532 Ohm
Lead Channel Impedance Value: 665 Ohm
Lead Channel Impedance Value: 931 Ohm
Lead Channel Pacing Threshold Amplitude: 0.5 V
Lead Channel Pacing Threshold Pulse Width: 0.4 ms
Lead Channel Pacing Threshold Pulse Width: 0.4 ms
Lead Channel Sensing Intrinsic Amplitude: 11.75 mV
Lead Channel Sensing Intrinsic Amplitude: 11.75 mV
Lead Channel Sensing Intrinsic Amplitude: 2.5 mV
Lead Channel Setting Pacing Amplitude: 1.5 V
Lead Channel Setting Pacing Pulse Width: 0.4 ms
MDC IDC LEAD IMPLANT DT: 20170818
MDC IDC LEAD LOCATION: 753859
MDC IDC MSMT BATTERY REMAINING LONGEVITY: 78 mo
MDC IDC MSMT LEADCHNL LV IMPEDANCE VALUE: 494 Ohm
MDC IDC MSMT LEADCHNL LV IMPEDANCE VALUE: 551 Ohm
MDC IDC MSMT LEADCHNL LV IMPEDANCE VALUE: 608 Ohm
MDC IDC MSMT LEADCHNL RA PACING THRESHOLD AMPLITUDE: 0.5 V
MDC IDC MSMT LEADCHNL RA PACING THRESHOLD PULSEWIDTH: 0.4 ms
MDC IDC MSMT LEADCHNL RA SENSING INTR AMPL: 2.5 mV
MDC IDC MSMT LEADCHNL RV IMPEDANCE VALUE: 589 Ohm
MDC IDC MSMT LEADCHNL RV PACING THRESHOLD AMPLITUDE: 0.375 V
MDC IDC SESS DTM: 20190611063725
MDC IDC SET LEADCHNL RA PACING AMPLITUDE: 1.5 V
MDC IDC SET LEADCHNL RV PACING AMPLITUDE: 2 V
MDC IDC SET LEADCHNL RV PACING PULSEWIDTH: 0.4 ms
MDC IDC SET LEADCHNL RV SENSING SENSITIVITY: 0.9 mV
MDC IDC STAT BRADY AP VP PERCENT: 0.04 %
MDC IDC STAT BRADY RV PERCENT PACED: 99.79 %

## 2018-04-28 NOTE — Progress Notes (Signed)
Remote pacemaker transmission.   

## 2018-05-04 ENCOUNTER — Other Ambulatory Visit: Payer: Self-pay | Admitting: Rheumatology

## 2018-05-04 NOTE — Telephone Encounter (Signed)
Last visit: 12/10/2017 Next visit: 06/17/2018 Labs: 04/08/18 stable PLQ Eye Exam: 02/18/18 WNL  Okay to refill per Dr. Estanislado Pandy

## 2018-05-06 ENCOUNTER — Other Ambulatory Visit: Payer: Self-pay | Admitting: Interventional Cardiology

## 2018-05-18 ENCOUNTER — Other Ambulatory Visit: Payer: Self-pay | Admitting: Rheumatology

## 2018-05-18 NOTE — Telephone Encounter (Signed)
Last visit: 12/10/2017 Next visit: 06/17/2018 Labs:04/08/18 stable  Okay to refill per Dr. Estanislado Pandy

## 2018-06-03 NOTE — Progress Notes (Signed)
Office Visit Note  Patient: [name]             Date of Birth: 1946/08/04           MRN: [id number]             PCP: Darcus Austin, MD Referring: Darcus Austin, MD Visit Date: 06/17/2018 Occupation: @GUAROCC @  Subjective:  medication monitoring   History of Present Illness: [name] is a 72 y.o. female with history of seropositive rheumatoid arthritis, osteoarthritis, and DDD.  She is taking Plaquenil 200 mg 1 tablet by mouth twice daily Monday through Friday, methotrexate 6 tablets by mouth once weekly, and folic acid 1 mg by mouth daily.  She denies missing any doses recently.  She feels a Plaquenil methotrexate been keeping her rheumatoid arthritis well controlled.  She states she continues to have some discomfort in bilateral hands especially in her right index finger.  She states the pain is most severe when she is working on a computer for long period of time.  She denies any joint swelling.  She states she continues to have decreased grip strength and difficulty with fine motor movements.  She denies any wrist pain at this time.  She denies any pain in her feet or any joint swelling.  She states she continues to have chronic neck pain as well as right-sided radiculopathy.  She states she saw a neurologist who is recommending neurosurgery but she weighed the risk and benefits and does not want to proceed with surgery at this time.  She has chronic lower back pain and stiffness but denies any symptoms of sciatica.      Activities of Daily Living:  Patient reports morning stiffness for 0 none.   Patient Reports nocturnal pain.  Difficulty dressing/grooming: Denies Difficulty climbing stairs: Denies Difficulty getting out of chair: Denies Difficulty using hands for taps, buttons, cutlery, and/or writing: Reports  Review of Systems  Constitutional: Negative for activity change and fatigue.  HENT: Negative for mouth sores, mouth dryness and nose dryness.   Eyes:  Positive for itching and dryness. Negative for pain and visual disturbance.  Respiratory: Positive for cough. Negative for hemoptysis, shortness of breath, wheezing and difficulty breathing.   Cardiovascular: Positive for swelling in legs/feet. Negative for chest pain, palpitations and hypertension.  Gastrointestinal: Negative for blood in stool, constipation and diarrhea.  Endocrine: Negative for increased urination.  Genitourinary: Negative for difficulty urinating and painful urination.  Musculoskeletal: Negative for arthralgias, joint pain, joint swelling, myalgias, muscle weakness, morning stiffness, muscle tenderness and myalgias.  Skin: Negative for color change, pallor, rash, hair loss, nodules/bumps, skin tightness, ulcers and sensitivity to sunlight.  Allergic/Immunologic: Negative for susceptible to infections.  Neurological: Negative for dizziness, headaches and weakness.  Hematological: Negative for swollen glands.  Psychiatric/Behavioral: Positive for sleep disturbance. Negative for depressed mood. The patient is not nervous/anxious.     PMFS History:  Patient Active Problem List   Diagnosis Date Noted  . Bilateral carpal tunnel syndrome 04/14/2018  . Right arm pain 01/07/2018  . Cervical spinal stenosis 01/07/2018  . Cervical radiculopathy 01/07/2018  . Stenosis of left subclavian artery ([place]) 12/15/2017  . History of asthma 06/25/2017  . History of gastroesophageal reflux (GERD) 06/25/2017  . History of anxiety 06/25/2017  . History of bilateral carpal tunnel release 06/25/2017  . Former smoker 01/25/2017  . Rheumatoid arthritis involving multiple sites with positive rheumatoid factor ([place]) 01/17/2017  . High risk medication use 01/17/2017  . Primary osteoarthritis  of both hands 01/17/2017  . Primary osteoarthritis of both feet 01/17/2017  . DJD (degenerative joint disease), cervical 01/17/2017  . Spondylosis of lumbar region without myelopathy or radiculopathy  01/17/2017  . Syncope and collapse 07/07/2016  . Cardiomyopathy, ischemic 07/07/2016  . CAD- S/P LAD thrombectomy 07/04/16 07/07/2016  . Pacemaker-MDT BiV placed 07/05/16 07/07/2016  . History of rheumatoid arthritis 07/07/2016  . Complete heart block ([place]) 07/04/2016  . Essential hypertension 07/04/2016  . LBBB (left bundle branch block) 07/04/2016  . Ventricular tachycardia ([place]) 07/04/2016  . Faintness 07/04/2016  . NSTEMI (non-ST elevated myocardial infarction) ([place]) 07/04/2016  . Exostosis 06/18/2011  . History of breast cancer 02/16/2010  . Vitamin D deficiency 02/15/2010  . HYPERCHOLESTEROLEMIA 02/15/2010  . ANXIETY 02/15/2010  . Asthma 02/15/2010  . MIGRAINES, HX OF 02/15/2010  . MASTECTOMY, LEFT, HX OF 02/15/2010    Past Medical History:  Diagnosis Date  . Anxiety   . Asthma   . Breast cancer ([place])    left breast  . Carpal tunnel syndrome   . Depression   . Habitual alcohol use   . Heart attack ([place])   . Hypertension   . Personal history of chemotherapy   . Personal history of radiation therapy   . RA (rheumatoid arthritis) ([place])   . Spinal stenosis   . Vision abnormalities     Family History  Problem Relation Age of Onset  . Stroke Mother   . Cancer Father        colon?  . Pyelonephritis Sister   . Cancer Paternal Aunt   . Cancer Paternal Uncle    Past Surgical History:  Procedure Laterality Date  . BREAST BIOPSY    . BREAST LUMPECTOMY  2005   left  . CARDIAC CATHETERIZATION N/A 07/04/2016   Procedure: Left Heart Cath and Coronary Angiography;  Surgeon: Belva Crome, MD;  Location: [facility];  Service: Cardiovascular;  Laterality: N/A;  . CARDIAC CATHETERIZATION N/A 07/04/2016   Procedure: Temporary Pacemaker;  Surgeon: Belva Crome, MD;  Location: [facility];  Service: Cardiovascular;  Laterality: N/A;  . CARPAL TUNNEL RELEASE    . CATARACT EXTRACTION Bilateral   . EP IMPLANTABLE DEVICE N/A 07/05/2016   Procedure: Pacemaker Implant;   Surgeon: Evans Lance, MD;  Location: [facility];  Service: Cardiovascular;  Laterality: N/A;  . [place]  . LYMPHECTOMY  2005   Social History   Social History Narrative  . Not on file    Objective: Vital Signs: BP 122/61 (BP Location: Right Arm, Patient Position: Sitting, Cuff Size: Normal)   Pulse 80   Resp 16   Ht 5\' 2"  (1.575 m)   Wt 140 lb (63.5 kg)   BMI 25.61 kg/m    Physical Exam  Constitutional: She is oriented to person, place, and time. She appears well-developed and well-nourished.  HENT:  Head: Normocephalic and atraumatic.  Eyes: Conjunctivae and EOM are normal.  Neck: Normal range of motion.  Cardiovascular: Normal rate, regular rhythm, normal heart sounds and intact distal pulses.  Pacemaker  Pulmonary/Chest: Effort normal and breath sounds normal.  Abdominal: Soft. Bowel sounds are normal.  Lymphadenopathy:    She has no cervical adenopathy.  Neurological: She is alert and oriented to person, place, and time.  Skin: Skin is warm and dry. Capillary refill takes less than 2 seconds.  Psychiatric: She has a normal mood and affect. Her behavior is normal.  Nursing note and vitals reviewed.  Musculoskeletal Exam: C-spine limited range of motion with discomfort. Thoracic kyphosis. Limited range of motion of lumbar spine.  No midline spinal tenderness.  No SI joint tenderness.  Slightly limited range of motion of left shoulder with some discomfort.  Right shoulder good range of motion. Right elbow joint contracture. Left elbow good ROM. Limited ROM of bilateral wrists with synovial thickening. MCPs, PIPs, DIPs good range of motion with no synovitis.  She is complete fist formation bilaterally.  Ulnar deviation noted bilaterally.  She has synovial thickening of all MCP joints. PIP and DIP synovial thickening consistent with osteoarthritis. Knee joints, ankle joints, MTPs, PIPs, and DIPs good ROM with no synovitis. No warmth or effusion of knee  joints.  Bilateral knee crepitus.   CDAI Exam: CDAI Homunculus Exam:   Joint Counts:  CDAI Tender Joint count: 0 CDAI Swollen Joint count: 0  Global Assessments:  Patient Global Assessment: 2 Provider Global Assessment: 2  CDAI Calculated Score: 4   Investigation: No additional findings.  Imaging: No results found.  Recent Labs: Lab Results  Component Value Date   WBC 6.6 04/08/2018   HGB 13.5 04/08/2018   PLT 257 04/08/2018   NA 139 04/08/2018   K 4.4 04/08/2018   CL 102 04/08/2018   CO2 32 04/08/2018   GLUCOSE 98 04/08/2018   BUN 12 04/08/2018   CREATININE 0.58 (L) 04/08/2018   BILITOT 0.5 04/08/2018   ALKPHOS 66 12/13/2017   AST 31 04/08/2018   ALT 21 04/08/2018   PROT 5.9 (L) 04/08/2018   ALBUMIN 3.5 12/13/2017   CALCIUM 8.8 04/08/2018   GFRAA 105 04/08/2018    Speciality Comments: PLQ Eye Exam: 02/18/18 WNL @ [facility] Follow up in 1 year  Procedures:  No procedures performed Allergies: Betadine [povidone iodine] and Hydrocodone   Assessment / Plan:     Visit Diagnoses: Rheumatoid arthritis involving multiple sites with positive rheumatoid factor (HCC) - +RF, +anti-CCP, +ANA: She has no synovitis on exam.  She has ulnar deviation and synovial thickening of all MCP joints.  She has no tenderness on exam.  She has discomfort in bilateral hands at times, which is likely due osteoarthritis.  She has tenderness of the right CMC joint, but declined a CMC brace at this time.  She is clinically doing well on Plaquenil 200 mg 1 tablet twice daily Monday through Friday, methotrexate 6 tablets by mouth once weekly, and folic acid 1 mg daily.  She does not need any refills at this time.  She will follow-up in 5 months in the office.  She is advised to notify us if she develops increased joint pain or joint swelling.  High risk medication use - PLQ 200 mg bid M-F, MTX 6 tabs po q wk, folic acid 1 mg.eye exam: 02/18/2018 - CBC and CMP were drawn today to monitor for drug toxicity.   She will return in 3 months for CBC and CMP.  Standing orders are in place. Plan: CBC with Differential/Platelet, COMPLETE METABOLIC PANEL WITH GFR  Primary osteoarthritis of both hands: She has PIP and DIP synovial thickening consistent with osteoarthritis of both hands.  She has right CMC joint tenderness.  She declined a right CMC joint brace at this time.  Joint protection and muscle strengthening were discussed.    Primary osteoarthritis of both feet: She has osteoarthritic changes in bilateral feet.  She wears proper fitting shoes.    History of bilateral carpal tunnel release: She has no symptoms at this time.  DDD (degenerative disc disease), cervical: She has chronic neck pain with right sided radiculopathy.  She was evaluated by a neurologist who recommended surgery.  After weighing the risks and benefits, the patient decided to proceed with conservative measures. She has limited ROM on exam with discomfort.   DDD (degenerative disc disease), lumbar - s/p laminectomy 1960s: Chronic pain.  Limited ROM on exam.    Other medical conditions are listed as follows:   History of vitamin D deficiency  History of hypertension  History of asthma  History of hyperlipidemia  History of anxiety  History of gastroesophageal reflux (GERD)  History of ventricular tachycardia - History of Pacemaker placement  History of left bundle branch block  History of breast cancer - 2005, lumpectomy, chemotherapy, radiation therapy, lymph node resection  Former smoker   Orders: Orders Placed This Encounter  Procedures  . CBC with Differential/Platelet  . COMPLETE METABOLIC PANEL WITH GFR   No orders of the defined types were placed in this encounter.    Follow-Up Instructions: Return in about 5 months (around 11/17/2018) for Rheumatoid arthritis, Osteoarthritis, DDD.   Ofilia Neas, PA-C   I examined and evaluated the patient with Hazel Sams PA.  Patient complains of some  arthralgias.  She had no synovitis on my examination.  The plan of care was discussed as noted above.  Bo Merino, MD  Note - This record has been created using Editor, commissioning.  Chart creation errors have been sought, but may not always  have been located. Such creation errors do not reflect on  the standard of medical care.

## 2018-06-17 ENCOUNTER — Ambulatory Visit: Payer: Medicare Other | Admitting: Rheumatology

## 2018-06-17 ENCOUNTER — Encounter: Payer: Self-pay | Admitting: Rheumatology

## 2018-06-17 VITALS — BP 122/61 | HR 80 | Resp 16 | Ht 62.0 in | Wt 140.0 lb

## 2018-06-17 DIAGNOSIS — Z9889 Other specified postprocedural states: Secondary | ICD-10-CM

## 2018-06-17 DIAGNOSIS — Z8639 Personal history of other endocrine, nutritional and metabolic disease: Secondary | ICD-10-CM

## 2018-06-17 DIAGNOSIS — M5136 Other intervertebral disc degeneration, lumbar region: Secondary | ICD-10-CM

## 2018-06-17 DIAGNOSIS — Z8709 Personal history of other diseases of the respiratory system: Secondary | ICD-10-CM

## 2018-06-17 DIAGNOSIS — M19041 Primary osteoarthritis, right hand: Secondary | ICD-10-CM

## 2018-06-17 DIAGNOSIS — M19071 Primary osteoarthritis, right ankle and foot: Secondary | ICD-10-CM | POA: Diagnosis not present

## 2018-06-17 DIAGNOSIS — Z79899 Other long term (current) drug therapy: Secondary | ICD-10-CM | POA: Diagnosis not present

## 2018-06-17 DIAGNOSIS — Z87891 Personal history of nicotine dependence: Secondary | ICD-10-CM

## 2018-06-17 DIAGNOSIS — Z8719 Personal history of other diseases of the digestive system: Secondary | ICD-10-CM

## 2018-06-17 DIAGNOSIS — M19072 Primary osteoarthritis, left ankle and foot: Secondary | ICD-10-CM

## 2018-06-17 DIAGNOSIS — M0579 Rheumatoid arthritis with rheumatoid factor of multiple sites without organ or systems involvement: Secondary | ICD-10-CM | POA: Diagnosis not present

## 2018-06-17 DIAGNOSIS — Z8679 Personal history of other diseases of the circulatory system: Secondary | ICD-10-CM

## 2018-06-17 DIAGNOSIS — Z853 Personal history of malignant neoplasm of breast: Secondary | ICD-10-CM

## 2018-06-17 DIAGNOSIS — M5030 Other cervical disc degeneration, unspecified cervical region: Secondary | ICD-10-CM

## 2018-06-17 DIAGNOSIS — M19042 Primary osteoarthritis, left hand: Secondary | ICD-10-CM

## 2018-06-17 DIAGNOSIS — Z8659 Personal history of other mental and behavioral disorders: Secondary | ICD-10-CM

## 2018-06-17 NOTE — Patient Instructions (Addendum)
Standing Labs We placed an order today for your standing lab work.   Please come back and get your standing labs in October and every 3 months   We have open lab Monday through Friday from 8:30-11:30 AM and 1:30-4:00 PM  at the office of Dr. Shaili Deveshwar.   You may experience shorter wait times on Monday and Friday afternoons. The office is located at [address], [address], [place], [place] [address] No appointment is necessary.   Labs are drawn by [facility].  You may receive a bill from [facility] for your lab work. If you have any questions regarding directions or hours of operation,  please call [phone].    

## 2018-06-18 LAB — COMPLETE METABOLIC PANEL WITH GFR
AG RATIO: 1.7 (calc) (ref 1.0–2.5)
ALKALINE PHOSPHATASE (APISO): 70 U/L (ref 33–130)
ALT: 28 U/L (ref 6–29)
AST: 40 U/L — AB (ref 10–35)
Albumin: 4 g/dL (ref 3.6–5.1)
BILIRUBIN TOTAL: 0.5 mg/dL (ref 0.2–1.2)
BUN: 14 mg/dL (ref 7–25)
CHLORIDE: 101 mmol/L (ref 98–110)
CO2: 32 mmol/L (ref 20–32)
Calcium: 8.9 mg/dL (ref 8.6–10.4)
Creat: 0.63 mg/dL (ref 0.60–0.93)
GFR, EST NON AFRICAN AMERICAN: 88 mL/min/{1.73_m2} (ref >=60)
GFR, Est African American: 102 mL/min/{1.73_m2} (ref >=60)
GLOBULIN: 2.3 g/dL (ref 1.9–3.7)
Glucose, Bld: 97 mg/dL (ref 65–99)
POTASSIUM: 4.4 mmol/L (ref 3.5–5.3)
SODIUM: 140 mmol/L (ref 135–146)
Total Protein: 6.3 g/dL (ref 6.1–8.1)

## 2018-06-18 LAB — CBC WITH DIFFERENTIAL/PLATELET
BASOS ABS: 77 {cells}/uL (ref 0–200)
Basophils Relative: 1.1 %
Eosinophils Absolute: 371 cells/uL (ref 15–500)
Eosinophils Relative: 5.3 %
HEMATOCRIT: 42 % (ref 35.0–45.0)
Hemoglobin: 14.3 g/dL (ref 11.7–15.5)
Lymphs Abs: 1568 cells/uL (ref 850–3900)
MCH: 32 pg (ref 27.0–33.0)
MCHC: 34 g/dL (ref 32.0–36.0)
MCV: 94 fL (ref 80.0–100.0)
MPV: 9.4 fL (ref 7.5–12.5)
Monocytes Relative: 13.5 %
NEUTROS PCT: 57.7 %
Neutro Abs: 4039 cells/uL (ref 1500–7800)
Platelets: 273 10*3/uL (ref 140–400)
RBC: 4.47 10*6/uL (ref 3.80–5.10)
RDW: 13.1 % (ref 11.0–15.0)
Total Lymphocyte: 22.4 %
WBC: 7 10*3/uL (ref 3.8–10.8)
WBCMIX: 945 {cells}/uL (ref 200–950)

## 2018-07-02 ENCOUNTER — Telehealth: Payer: Self-pay | Admitting: Rheumatology

## 2018-07-02 NOTE — Telephone Encounter (Signed)
Patient called stating that Dr. Arlean Hopping nurse told her to stop taking Tylenol and her cardiologist told her that she can only take Tylenol for pain.  Patient states her lower back is very painful and doesn't know what she can take to help with the pain.  Patient requested a return call.  Patient states she will be home after 1:30 pm today.

## 2018-07-02 NOTE — Telephone Encounter (Signed)
Returned patient's call and advised patient to take tylenol sparingly. Patient verbalized understanding.

## 2018-07-02 NOTE — Telephone Encounter (Signed)
LFTs are still elevated.  She can take Tylenol sparingly.

## 2018-07-02 NOTE — Telephone Encounter (Signed)
06/17/2018 labs, AST is elevated. Please advise patient to avoid NSAIDs, tylenol, and alcohol. I discussed with Dr. Estanislado Pandy and she would like the patient to decrease MTX to 5 tablets by mouth once a week.  Any other recommendations for patient?

## 2018-07-10 ENCOUNTER — Ambulatory Visit: Payer: Medicare Other | Admitting: Internal Medicine

## 2018-07-10 ENCOUNTER — Encounter: Payer: Self-pay | Admitting: Internal Medicine

## 2018-07-10 VITALS — BP 136/58 | HR 78 | Ht 62.0 in | Wt 139.2 lb

## 2018-07-10 DIAGNOSIS — I442 Atrioventricular block, complete: Secondary | ICD-10-CM

## 2018-07-10 DIAGNOSIS — I255 Ischemic cardiomyopathy: Secondary | ICD-10-CM

## 2018-07-10 DIAGNOSIS — Z950 Presence of cardiac pacemaker: Secondary | ICD-10-CM

## 2018-07-10 NOTE — Progress Notes (Signed)
HPI [name] returns today for followup of her chronic systolic heart failure, CHB, s/p BiV PPM insertion. The patient has done well since I saw her last. She has had no chest pain, and denies syncope or shortness of breath. Minimal edema. Rare palpitations. Allergies  Allergen Reactions  . Betadine [Povidone Iodine] Shortness Of Breath  . Hydrocodone Itching     Current Outpatient Medications  Medication Sig Dispense Refill  . acetaminophen (TYLENOL) 325 MG tablet Take 2 tablets (650 mg total) by mouth every 4 (four) hours as needed for headache or mild pain.    Marland Kitchen albuterol (PROVENTIL HFA;VENTOLIN HFA) 108 (90 Base) MCG/ACT inhaler Inhale into the lungs every 6 (six) hours as needed for wheezing or shortness of breath.    Marland Kitchen aspirin 81 MG tablet Take 81 mg by mouth daily.      Marland Kitchen atorvastatin (LIPITOR) 40 MG tablet Take 1 tablet (40 mg total) by mouth daily at 6 PM. 30 tablet 11  . budesonide-formoterol (SYMBICORT) 160-4.5 MCG/ACT inhaler Inhale 2 puffs into the lungs 2 (two) times daily.    . calcium carbonate (OS-CAL) 600 MG TABS Take 600 mg by mouth 2 (two) times daily with a meal.      . citalopram (CELEXA) 20 MG tablet Take 20 mg by mouth daily.  3  . clonazePAM (KLONOPIN) 0.5 MG tablet Take 0.5 mg by mouth every 12 (twelve) hours as needed for anxiety.  0  . Ergocalciferol (VITAMIN D2) 2000 units TABS Take 2,000 Units by mouth daily.    . folic acid (FOLVITE) 1 MG tablet TAKE ONE TABLET BY MOUTH ONCE DAILY 90 tablet 4  . hydroxychloroquine (PLAQUENIL) 200 MG tablet TAKE 1 TABLET BY MOUTH TWICE DAILY MONDAY THROUGH FRIDAY 120 tablet 0  . losartan (COZAAR) 25 MG tablet TAKE 1 TABLET(25 MG) BY MOUTH DAILY 90 tablet 3  . methotrexate (RHEUMATREX) 2.5 MG tablet TAKE 6 TABLETS BY MOUTH ONCE WEEKLY. CAUTION CHEMOTHERAPY. PROTECT FROM LIGHT 72 tablet 0  . nitroGLYCERIN (NITROSTAT) 0.4 MG SL tablet Place 1 tablet (0.4 mg total) under the tongue every 5 (five) minutes as needed for  chest pain. 25 tablet 2  . pantoprazole (PROTONIX) 40 MG tablet TAKE 1 TABLET(40 MG) BY MOUTH DAILY 90 tablet 3   No current facility-administered medications for this visit.      Past Medical History:  Diagnosis Date  . Anxiety   . Asthma   . Breast cancer ([place])    left breast  . Carpal tunnel syndrome   . Depression   . Habitual alcohol use   . Heart attack ([place])   . Hypertension   . Personal history of chemotherapy   . Personal history of radiation therapy   . RA (rheumatoid arthritis) ([place])   . Spinal stenosis   . Vision abnormalities     ROS:   All systems reviewed and negative except as noted in the HPI.   Past Surgical History:  Procedure Laterality Date  . BREAST BIOPSY    . BREAST LUMPECTOMY  2005   left  . CARDIAC CATHETERIZATION N/A 07/04/2016   Procedure: Left Heart Cath and Coronary Angiography;  Surgeon: Belva Crome, MD;  Location: [facility];  Service: Cardiovascular;  Laterality: N/A;  . CARDIAC CATHETERIZATION N/A 07/04/2016   Procedure: Temporary Pacemaker;  Surgeon: Belva Crome, MD;  Location: [facility];  Service: Cardiovascular;  Laterality: N/A;  . CARPAL TUNNEL RELEASE    .  CATARACT EXTRACTION Bilateral   . EP IMPLANTABLE DEVICE N/A 07/05/2016   Procedure: Pacemaker Implant;  Surgeon: Evans Lance, MD;  Location: [facility];  Service: Cardiovascular;  Laterality: N/A;  . [place]  . LYMPHECTOMY  2005     Family History  Problem Relation Age of Onset  . Stroke Mother   . Cancer Father        colon?  . Pyelonephritis Sister   . Cancer Paternal Aunt   . Cancer Paternal Uncle      Social History   Socioeconomic History  . Marital status: Widowed    Spouse name: Not on file  . Number of children: Not on file  . Years of education: Not on file  . Highest education level: Not on file  Occupational History  . Not on file  Social Needs  . Financial resource strain: Not on file  . Food insecurity:      Worry: Not on file    Inability: Not on file  . Transportation needs:    Medical: Not on file    Non-medical: Not on file  Tobacco Use  . Smoking status: Former Smoker    Packs/day: 1.50    Years: 30.00    Pack years: 45.00    Types: Cigarettes    Last attempt to quit: 1995    Years since quitting: 24.6  . Smokeless tobacco: Never Used  Substance and Sexual Activity  . Alcohol use: Yes    Alcohol/week: 14.0 standard drinks    Types: 14 Cans of beer per week    Comment: 2 DAILY  . Drug use: No  . Sexual activity: Not on file  Lifestyle  . Physical activity:    Days per week: Not on file    Minutes per session: Not on file  . Stress: Not on file  Relationships  . Social connections:    Talks on phone: Not on file    Gets together: Not on file    Attends religious service: Not on file    Active member of club or organization: Not on file    Attends meetings of clubs or organizations: Not on file    Relationship status: Not on file  . Intimate partner violence:    Fear of current or ex partner: Not on file    Emotionally abused: Not on file    Physically abused: Not on file    Forced sexual activity: Not on file  Other Topics Concern  . Not on file  Social History Narrative  . Not on file     BP (!) 136/58   Pulse 78   Ht 5\' 2"  (1.575 m)   Wt 139 lb 3.2 oz (63.1 kg)   SpO2 94%   BMI 25.46 kg/m   Physical Exam:  Well appearing 72 yo woman, NAD HEENT: Unremarkable Neck:  6 cm JVD, no thyromegally Lymphatics:  No adenopathy Back:  No CVA tenderness Lungs:  Clear with no wheezes HEART:  Regular rate rhythm, no murmurs, no rubs, no clicks Abd:  soft, positive bowel sounds, no organomegally, no rebound, no guarding Ext:  2 plus pulses, no edema, no cyanosis, no clubbing Skin:  No rashes no nodules Neuro:  CN II through XII intact, motor grossly intact  EKG - nsr with biv pacing  DEVICE  Normal device function.  See PaceArt for details.    Assess/Plan: 1. Chronic systolic heart failure - her symptoms are class 2A. She will  continue her current meds. 2. CHB - she is PPM dependent. She is asymptomatic. 3. PPM - her medtronic Biv PPM is working normally. We will recheck in several months. 4. HTN - her blood pressure is fairly well controlled. No change in her meds.  Cristopher Peru, M.D.  Mikle Bosworth.D.

## 2018-07-10 NOTE — Patient Instructions (Signed)

## 2018-07-15 LAB — CUP PACEART INCLINIC DEVICE CHECK
Battery Remaining Longevity: 77 mo
Battery Voltage: 3.02 V
Brady Statistic AP VP Percent: 0.08 %
Brady Statistic AP VS Percent: 0.02 %
Brady Statistic AS VP Percent: 99.87 %
Brady Statistic RV Percent Paced: 99.87 %
Implantable Lead Implant Date: 20170818
Implantable Lead Location: 753859
Implantable Lead Location: 753860
Implantable Lead Model: 4396
Implantable Lead Model: 5076
Lead Channel Impedance Value: 342 Ohm
Lead Channel Impedance Value: 494 Ohm
Lead Channel Impedance Value: 513 Ohm
Lead Channel Impedance Value: 532 Ohm
Lead Channel Impedance Value: 551 Ohm
Lead Channel Impedance Value: 627 Ohm
Lead Channel Pacing Threshold Amplitude: 0.5 V
Lead Channel Pacing Threshold Amplitude: 0.5 V
Lead Channel Pacing Threshold Amplitude: 0.5 V
Lead Channel Pacing Threshold Pulse Width: 0.4 ms
Lead Channel Pacing Threshold Pulse Width: 0.4 ms
Lead Channel Sensing Intrinsic Amplitude: 3 mV
Lead Channel Setting Pacing Amplitude: 1.5 V
Lead Channel Setting Pacing Amplitude: 2 V
Lead Channel Setting Pacing Pulse Width: 0.4 ms
Lead Channel Setting Sensing Sensitivity: 0.9 mV
MDC IDC LEAD IMPLANT DT: 20170818
MDC IDC LEAD IMPLANT DT: 20170818
MDC IDC LEAD LOCATION: 753858
MDC IDC MSMT LEADCHNL LV IMPEDANCE VALUE: 437 Ohm
MDC IDC MSMT LEADCHNL LV IMPEDANCE VALUE: 817 Ohm
MDC IDC MSMT LEADCHNL RA IMPEDANCE VALUE: 475 Ohm
MDC IDC MSMT LEADCHNL RA PACING THRESHOLD PULSEWIDTH: 0.4 ms
MDC IDC MSMT LEADCHNL RV SENSING INTR AMPL: 11.875 mV
MDC IDC PG IMPLANT DT: 20170818
MDC IDC SESS DTM: 20190823210854
MDC IDC SET LEADCHNL LV PACING PULSEWIDTH: 0.4 ms
MDC IDC SET LEADCHNL RA PACING AMPLITUDE: 1.5 V
MDC IDC STAT BRADY AS VS PERCENT: 0.03 %
MDC IDC STAT BRADY RA PERCENT PACED: 0.1 %

## 2018-07-23 ENCOUNTER — Other Ambulatory Visit: Payer: Self-pay

## 2018-07-24 MED ORDER — ATORVASTATIN CALCIUM 40 MG PO TABS: 40.0000 mg | ORAL_TABLET | Freq: Every day | ORAL | 3 refills | Status: DC

## 2018-07-27 ENCOUNTER — Telehealth: Payer: Self-pay

## 2018-07-27 ENCOUNTER — Ambulatory Visit (INDEPENDENT_AMBULATORY_CARE_PROVIDER_SITE_OTHER): Payer: Medicare Other | Admitting: *Deleted

## 2018-07-27 DIAGNOSIS — I442 Atrioventricular block, complete: Secondary | ICD-10-CM

## 2018-07-27 NOTE — Telephone Encounter (Signed)
Spoke with pt and reminded pt of remote transmission that is due today. Pt verbalized understanding.   

## 2018-07-28 ENCOUNTER — Encounter: Payer: Self-pay | Admitting: Cardiology

## 2018-07-28 NOTE — Progress Notes (Signed)
Remote pacemaker transmission.   

## 2018-07-28 NOTE — Progress Notes (Signed)
Letter  

## 2018-08-04 ENCOUNTER — Other Ambulatory Visit: Payer: Self-pay | Admitting: Rheumatology

## 2018-08-04 NOTE — Telephone Encounter (Signed)
Last Visit: 06/17/18 Next Visit: 11/25/18 Labs: 06/17/18  PLQ Eye Exam: 02/18/18 WNL AST is elevated. Please advise patient to avoid NSAIDs, tylenol, and alcohol.  Okay to refill per Dr. Estanislado Pandy

## 2018-08-18 LAB — CUP PACEART REMOTE DEVICE CHECK
Battery Remaining Longevity: 75 mo
Battery Voltage: 3.02 V
Brady Statistic AP VP Percent: 0.04 %
Brady Statistic AP VS Percent: 0.02 %
Brady Statistic AS VS Percent: 0.01 %
Brady Statistic RA Percent Paced: 0.06 %
Brady Statistic RV Percent Paced: 99.98 %
Implantable Lead Implant Date: 20170818
Implantable Lead Implant Date: 20170818
Implantable Lead Implant Date: 20170818
Implantable Lead Location: 753859
Implantable Lead Model: 4396
Implantable Lead Model: 5076
Implantable Lead Model: 5076
Lead Channel Impedance Value: 361 Ohm
Lead Channel Impedance Value: 475 Ohm
Lead Channel Impedance Value: 494 Ohm
Lead Channel Impedance Value: 532 Ohm
Lead Channel Impedance Value: 551 Ohm
Lead Channel Impedance Value: 608 Ohm
Lead Channel Impedance Value: 665 Ohm
Lead Channel Impedance Value: 912 Ohm
Lead Channel Pacing Threshold Amplitude: 0.375 V
Lead Channel Pacing Threshold Amplitude: 0.5 V
Lead Channel Pacing Threshold Pulse Width: 0.4 ms
Lead Channel Pacing Threshold Pulse Width: 0.4 ms
Lead Channel Sensing Intrinsic Amplitude: 2.5 mV
Lead Channel Setting Pacing Amplitude: 1.5 V
Lead Channel Setting Pacing Pulse Width: 0.4 ms
Lead Channel Setting Sensing Sensitivity: 0.9 mV
MDC IDC LEAD LOCATION: 753858
MDC IDC LEAD LOCATION: 753860
MDC IDC MSMT LEADCHNL LV PACING THRESHOLD AMPLITUDE: 0.625 V
MDC IDC MSMT LEADCHNL LV PACING THRESHOLD PULSEWIDTH: 0.4 ms
MDC IDC MSMT LEADCHNL RA SENSING INTR AMPL: 2.5 mV
MDC IDC MSMT LEADCHNL RV IMPEDANCE VALUE: 494 Ohm
MDC IDC MSMT LEADCHNL RV SENSING INTR AMPL: 11.875 mV
MDC IDC MSMT LEADCHNL RV SENSING INTR AMPL: 11.875 mV
MDC IDC PG IMPLANT DT: 20170818
MDC IDC SESS DTM: 20190910002305
MDC IDC SET LEADCHNL LV PACING AMPLITUDE: 1.75 V
MDC IDC SET LEADCHNL LV PACING PULSEWIDTH: 0.4 ms
MDC IDC SET LEADCHNL RV PACING AMPLITUDE: 2 V
MDC IDC STAT BRADY AS VP PERCENT: 99.94 %

## 2018-08-24 ENCOUNTER — Other Ambulatory Visit: Payer: Self-pay | Admitting: Rheumatology

## 2018-08-24 NOTE — Telephone Encounter (Signed)
Last Visit: 06/17/18 Next Visit: 11/25/18 Labs: 06/17/18 AST is elevated decrease MTX to 5 tablets by mouth once a week

## 2018-08-26 ENCOUNTER — Other Ambulatory Visit: Payer: Self-pay

## 2018-08-26 DIAGNOSIS — Z79899 Other long term (current) drug therapy: Secondary | ICD-10-CM

## 2018-08-26 LAB — CBC WITH DIFFERENTIAL/PLATELET
BASOS ABS: 81 {cells}/uL (ref 0–200)
Basophils Relative: 1.5 %
Eosinophils Absolute: 513 cells/uL — ABNORMAL HIGH (ref 15–500)
Eosinophils Relative: 9.5 %
HEMATOCRIT: 40.5 % (ref 35.0–45.0)
HEMOGLOBIN: 13.8 g/dL (ref 11.7–15.5)
Lymphs Abs: 1507 cells/uL (ref 850–3900)
MCH: 32.2 pg (ref 27.0–33.0)
MCHC: 34.1 g/dL (ref 32.0–36.0)
MCV: 94.4 fL (ref 80.0–100.0)
MONOS PCT: 11.9 %
MPV: 9.4 fL (ref 7.5–12.5)
NEUTROS ABS: 2657 {cells}/uL (ref 1500–7800)
NEUTROS PCT: 49.2 %
Platelets: 259 10*3/uL (ref 140–400)
RBC: 4.29 10*6/uL (ref 3.80–5.10)
RDW: 13 % (ref 11.0–15.0)
Total Lymphocyte: 27.9 %
WBC mixed population: 643 cells/uL (ref 200–950)
WBC: 5.4 10*3/uL (ref 3.8–10.8)

## 2018-08-26 LAB — COMPLETE METABOLIC PANEL WITH GFR
AG Ratio: 1.5 (calc) (ref 1.0–2.5)
ALKALINE PHOSPHATASE (APISO): 70 U/L (ref 33–130)
ALT: 29 U/L (ref 6–29)
AST: 38 U/L — ABNORMAL HIGH (ref 10–35)
Albumin: 3.6 g/dL (ref 3.6–5.1)
BILIRUBIN TOTAL: 0.4 mg/dL (ref 0.2–1.2)
BUN: 14 mg/dL (ref 7–25)
CO2: 32 mmol/L (ref 20–32)
CREATININE: 0.64 mg/dL (ref 0.60–0.93)
Calcium: 9 mg/dL (ref 8.6–10.4)
Chloride: 101 mmol/L (ref 98–110)
GFR, Est African American: 102 mL/min/{1.73_m2} (ref >=60)
GFR, Est Non African American: 88 mL/min/{1.73_m2} (ref >=60)
GLOBULIN: 2.4 g/dL (ref 1.9–3.7)
Glucose, Bld: 100 mg/dL — ABNORMAL HIGH (ref 65–99)
Potassium: 4.2 mmol/L (ref 3.5–5.3)
SODIUM: 138 mmol/L (ref 135–146)
Total Protein: 6 g/dL — ABNORMAL LOW (ref 6.1–8.1)

## 2018-08-31 ENCOUNTER — Other Ambulatory Visit: Payer: Self-pay | Admitting: Physician Assistant

## 2018-09-15 ENCOUNTER — Other Ambulatory Visit: Payer: Self-pay | Admitting: Rheumatology

## 2018-09-15 NOTE — Telephone Encounter (Signed)
Last Visit: 06/17/18 Next Visit: 11/25/18  Okay to refill per Dr. Estanislado Pandy

## 2018-10-26 ENCOUNTER — Other Ambulatory Visit: Payer: Self-pay | Admitting: Rheumatology

## 2018-10-26 ENCOUNTER — Ambulatory Visit (INDEPENDENT_AMBULATORY_CARE_PROVIDER_SITE_OTHER): Payer: Medicare Other

## 2018-10-26 DIAGNOSIS — I442 Atrioventricular block, complete: Secondary | ICD-10-CM

## 2018-10-26 DIAGNOSIS — I255 Ischemic cardiomyopathy: Secondary | ICD-10-CM

## 2018-10-26 NOTE — Telephone Encounter (Signed)
Last Visit: 06/17/18 Next Visit: 11/25/18 Labs: 7/31/19AST is elevated  PLQ Eye Exam: 02/18/18 WNL   Okay to refill per Dr. Estanislado Pandy

## 2018-10-28 NOTE — Progress Notes (Signed)
Remote pacemaker transmission.   

## 2018-11-12 NOTE — Progress Notes (Signed)
Office Visit Note  Patient: [name]             Date of Birth: Mar 20, 1946           MRN: [id number]             PCP: Darcus Austin, MD Referring: Darcus Austin, MD Visit Date: 11/25/2018 Occupation: @GUAROCC @  Subjective:  Neck pain    History of Present Illness: [name] is a 72 y.o. female with history of rheumatoid arthritis.  She is taking MTX 5 tablets by mouth once weekly.  She was found to have elevated LFTs on lab work drawn in July and October 2019.  She is also taking PLQ 200 mg BID M-F.  She denies any recent rheumatoid arthritis flares.  She states she has noticed some decreased grip strength.  She denies any pain or swelling of hands or feet.  She states she continues to have chronic neck pain and symptoms of right sided radiculopathy.  She has been evaluated by a neurologist and a neurosurgeon.  She would not be a good surgical and does not want to proceed with injections or PT.  She tried gabapentin but it did not help with the neuralgia. She has been trying to work on good posture and ROM. She states her lower back causes occasional discomfort if she is sweeping or dusting the house.  She denies any other joint pain or joint swelling at this time.    Activities of Daily Living:  Patient reports morning stiffness for 0 minutes.   Patient Reports nocturnal pain.  Difficulty dressing/grooming: Denies Difficulty climbing stairs: Denies Difficulty getting out of chair: Denies Difficulty using hands for taps, buttons, cutlery, and/or writing: Reports  Review of Systems  Constitutional: Negative for fatigue.  HENT: Positive for mouth dryness. Negative for mouth sores, trouble swallowing, trouble swallowing and nose dryness.   Eyes: Negative for pain, redness, itching, visual disturbance and dryness.  Respiratory: Negative for cough, hemoptysis, shortness of breath, wheezing and difficulty breathing.   Cardiovascular: Negative for chest pain, palpitations,  hypertension, irregular heartbeat and swelling in legs/feet.  Gastrointestinal: Negative for abdominal pain, blood in stool, constipation and diarrhea.  Endocrine: Negative for increased urination.  Genitourinary: Negative for painful urination, nocturia and pelvic pain.  Musculoskeletal: Positive for arthralgias and joint pain. Negative for joint swelling, myalgias, muscle weakness, morning stiffness, muscle tenderness and myalgias.  Skin: Negative for color change, pallor, rash, hair loss, nodules/bumps, skin tightness, ulcers and sensitivity to sunlight.  Allergic/Immunologic: Negative for susceptible to infections.  Neurological: Negative for dizziness, headaches and weakness.  Hematological: Negative for swollen glands.  Psychiatric/Behavioral: Negative for depressed mood, confusion and sleep disturbance. The patient is not nervous/anxious.     PMFS History:  Patient Active Problem List   Diagnosis Date Noted  . Bilateral carpal tunnel syndrome 04/14/2018  . Right arm pain 01/07/2018  . Cervical spinal stenosis 01/07/2018  . Cervical radiculopathy 01/07/2018  . Stenosis of left subclavian artery ([place]) 12/15/2017  . History of asthma 06/25/2017  . History of gastroesophageal reflux (GERD) 06/25/2017  . History of anxiety 06/25/2017  . History of bilateral carpal tunnel release 06/25/2017  . Former smoker 01/25/2017  . Rheumatoid arthritis involving multiple sites with positive rheumatoid factor ([place]) 01/17/2017  . High risk medication use 01/17/2017  . Primary osteoarthritis of both hands 01/17/2017  . Primary osteoarthritis of both feet 01/17/2017  . DJD (degenerative joint disease), cervical 01/17/2017  . Spondylosis of lumbar region without  myelopathy or radiculopathy 01/17/2017  . Syncope and collapse 07/07/2016  . Cardiomyopathy, ischemic 07/07/2016  . CAD- S/P LAD thrombectomy 07/04/16 07/07/2016  . Pacemaker-MDT BiV placed 07/05/16 07/07/2016  . History of rheumatoid  arthritis 07/07/2016  . Complete heart block ([place]) 07/04/2016  . Essential hypertension 07/04/2016  . LBBB (left bundle branch block) 07/04/2016  . Ventricular tachycardia ([place]) 07/04/2016  . Faintness 07/04/2016  . NSTEMI (non-ST elevated myocardial infarction) ([place]) 07/04/2016  . Exostosis 06/18/2011  . History of breast cancer 02/16/2010  . Vitamin D deficiency 02/15/2010  . HYPERCHOLESTEROLEMIA 02/15/2010  . ANXIETY 02/15/2010  . Asthma 02/15/2010  . MIGRAINES, HX OF 02/15/2010  . MASTECTOMY, LEFT, HX OF 02/15/2010    Past Medical History:  Diagnosis Date  . Anxiety   . Asthma   . Breast cancer ([place])    left breast  . Carpal tunnel syndrome   . Depression   . Habitual alcohol use   . Heart attack ([place])   . Hypertension   . Personal history of chemotherapy   . Personal history of radiation therapy   . RA (rheumatoid arthritis) ([place])   . Spinal stenosis   . Vision abnormalities     Family History  Problem Relation Age of Onset  . Stroke Mother   . Cancer Father        colon?  . Pyelonephritis Sister   . Cancer Paternal Aunt   . Cancer Paternal Uncle    Past Surgical History:  Procedure Laterality Date  . BREAST BIOPSY    . BREAST LUMPECTOMY  2005   left  . CARDIAC CATHETERIZATION N/A 07/04/2016   Procedure: Left Heart Cath and Coronary Angiography;  Surgeon: Belva Crome, MD;  Location: [facility];  Service: Cardiovascular;  Laterality: N/A;  . CARDIAC CATHETERIZATION N/A 07/04/2016   Procedure: Temporary Pacemaker;  Surgeon: Belva Crome, MD;  Location: [facility];  Service: Cardiovascular;  Laterality: N/A;  . CARPAL TUNNEL RELEASE    . CATARACT EXTRACTION Bilateral   . EP IMPLANTABLE DEVICE N/A 07/05/2016   Procedure: Pacemaker Implant;  Surgeon: Evans Lance, MD;  Location: [facility];  Service: Cardiovascular;  Laterality: N/A;  . [place]  . LYMPHECTOMY  2005   Social History   Social History Narrative  . Not on  file    Objective: Vital Signs: BP 140/78 (BP Location: Right Arm, Patient Position: Sitting, Cuff Size: Normal)   Pulse 77   Resp 12   Ht 5\' 2"  (1.575 m)   Wt 140 lb 6.4 oz (63.7 kg)   BMI 25.68 kg/m    Physical Exam Vitals signs and nursing note reviewed.  Constitutional:      Appearance: She is well-developed.  HENT:     Head: Normocephalic and atraumatic.  Eyes:     Conjunctiva/sclera: Conjunctivae normal.  Neck:     Musculoskeletal: Normal range of motion.  Cardiovascular:     Rate and Rhythm: Normal rate and regular rhythm.     Heart sounds: Normal heart sounds.  Pulmonary:     Effort: Pulmonary effort is normal.     Breath sounds: Normal breath sounds.  Abdominal:     General: Bowel sounds are normal.     Palpations: Abdomen is soft.  Lymphadenopathy:     Cervical: No cervical adenopathy.  Skin:    General: Skin is warm and dry.     Capillary Refill: Capillary refill takes less than 2 seconds.  Neurological:  Mental Status: She is alert and oriented to person, place, and time.  Psychiatric:        Behavior: Behavior normal.      Musculoskeletal Exam: C-spine limited ROM with discomfort.  Mild thoracic kyphosis.  Limited ROM of lumbar spine.  No midline spinal tenderness.  No SI joint tenderness.  Slightly limited shoulder abduction.  Right elbow joint contracture.  Left elbow joint good ROM.  Limited ROM of both wrist joints with synovial thickening.  Ulnar deviation and synovial thickening of all MCPs.  PIP and DIP synovial thickening consistent with osteoarthritis.  Bilateral CMC joint synovial thickening.  Hip joints good ROM with no discomfort.  Knee joints good ROM with no discomfort.  Bilateral knee joint crepitus.  No warmth or effusion of knee joints.  No tenderness or swelling of ankle joints.  No tenderness over trochanteric bursa bilaterally.   CDAI Exam: CDAI Score: Not documented Patient Global Assessment: Not documented; Provider Global  Assessment: Not documented Swollen: Not documented; Tender: Not documented Joint Exam   Not documented   There is currently no information documented on the homunculus. Go to the Rheumatology activity and complete the homunculus joint exam.  Investigation: No additional findings.  Imaging: No results found.  Recent Labs: Lab Results  Component Value Date   WBC 5.4 08/26/2018   HGB 13.8 08/26/2018   PLT 259 08/26/2018   NA 138 08/26/2018   K 4.2 08/26/2018   CL 101 08/26/2018   CO2 32 08/26/2018   GLUCOSE 100 (H) 08/26/2018   BUN 14 08/26/2018   CREATININE 0.64 08/26/2018   BILITOT 0.4 08/26/2018   ALKPHOS 66 12/13/2017   AST 38 (H) 08/26/2018   ALT 29 08/26/2018   PROT 6.0 (L) 08/26/2018   ALBUMIN 3.5 12/13/2017   CALCIUM 9.0 08/26/2018   GFRAA 102 08/26/2018    Speciality Comments: PLQ Eye Exam: 02/18/18 WNL @ [facility] Follow up in 1 year  Procedures:  No procedures performed Allergies: Betadine [povidone iodine] and Hydrocodone    Assessment / Plan:     Visit Diagnoses: Rheumatoid arthritis involving multiple sites with positive rheumatoid factor (HCC) - +RF, +anti-CCP, +ANA: She has no synovitis on exam.  She has not had any recent flares of rheumatoid arthritis.  She is clinically doing well on Methotrexate 5 tablets by mouth once weekly, folic acid 1 mg po daily, and PLQ 200 mg BID M-F.  Her AST was elevated at 40 on 06/17/18 and she was advised to decrease MTX from 6 tablets to 5 tablets by mouth once weekly.  She has not noticed increased joint pain or joint swelling since reducing the dose of MTX.  She has chronic neck pain and right sided radiculopathy.  She has no other joint pain or joint swelling.  Refills of PLQ and MTX were sent to the pharmacy.  She was advised to notify us if she develops increased joint pain or joint swelling.  She will follow up in 5 months.   High risk medication use - PLQ, MTX, folic acid.eye exam: 02/18/2018. AST was elevated at 40 on 06/17/18  and she was advised to decrease MTX from 6 tablets to 5 tablets by mouth once weekly. AST is trending down-AST 38 on 08/26/18.   She was advised to avoid tylenol, alcohol, and NSAIDs. We will continue to monitor lab work. CBC and CMP were drawn today to monitor for drug toxicity.  She will return in April and every 3 months for lab work to monitor  for drug toxicity. - Plan: CBC with Differential/Platelet, COMPLETE METABOLIC PANEL WITH GFR  Primary osteoarthritis of both hands: She has PIP and DIP synovial thickening consistent with osteoarthritis.  She has no synovitis.  She has complete fist formation bilaterally.  Joint protection and muscle strengthening were discussed.   Primary osteoarthritis of both feet: She has osteoarthritic changes in bilateral feet.  She has hammertoes and 1st MTP synovial thickening bilaterally. She wears proper fitting shoes.   History of bilateral carpal tunnel release: She has no symptoms of carpal tunnel at this time.   DDD (degenerative disc disease), cervical: She has limited ROM with discomfort.  She has been experiencing right sided radiculopathy.  She has been evaluated by a neurologist and a neurosurgeon, and according to the patient she is not a surgical candidate at this time.   DDD (degenerative disc disease), lumbar - s/p laminectomy 1960s  Other medical conditions are listed as follows:   History of vitamin D deficiency  History of breast cancer - 2005, lumpectomy, chemotherapy, radiation therapy, lymph node resection  History of gastroesophageal reflux (GERD)  History of ventricular tachycardia - History of Pacemaker placement  History of left bundle branch block  History of hyperlipidemia  History of hypertension  History of asthma  History of anxiety  Former smoker   Orders: Orders Placed This Encounter  Procedures  . CBC with Differential/Platelet  . COMPLETE METABOLIC PANEL WITH GFR   Meds ordered this encounter  Medications  .  hydroxychloroquine (PLAQUENIL) 200 MG tablet    Sig: Take 1 tablet by mouth twice daily Monday through Friday only.    Dispense:  120 tablet    Refill:  0  . methotrexate (RHEUMATREX) 2.5 MG tablet    Sig: Take 5 tablets (12.5 mg total) by mouth once a week.    Dispense:  60 tablet    Refill:  0      Follow-Up Instructions: Return in about 5 months (around 04/26/2019) for Rheumatoid arthritis.   Ofilia Neas, PA-C   I examined and evaluated the patient with Hazel Sams PA.  Patient is doing well on combination of methotrexate and hydroxychloroquine.  On my examination she has some synovial thickening but no synovitis.  We will continue current regimen.  The plan of care was discussed as noted above.  Bo Merino, MD  Note - This record has been created using Editor, commissioning.  Chart creation errors have been sought, but may not always  have been located. Such creation errors do not reflect on  the standard of medical care.

## 2018-11-25 ENCOUNTER — Encounter: Payer: Self-pay | Admitting: Rheumatology

## 2018-11-25 ENCOUNTER — Other Ambulatory Visit: Payer: Self-pay | Admitting: Family Medicine

## 2018-11-25 ENCOUNTER — Ambulatory Visit: Payer: Medicare Other | Admitting: Rheumatology

## 2018-11-25 ENCOUNTER — Other Ambulatory Visit: Payer: Self-pay | Admitting: Physician Assistant

## 2018-11-25 VITALS — BP 140/78 | HR 77 | Resp 12 | Ht 62.0 in | Wt 140.4 lb

## 2018-11-25 DIAGNOSIS — M19071 Primary osteoarthritis, right ankle and foot: Secondary | ICD-10-CM

## 2018-11-25 DIAGNOSIS — Z8659 Personal history of other mental and behavioral disorders: Secondary | ICD-10-CM

## 2018-11-25 DIAGNOSIS — Z79899 Other long term (current) drug therapy: Secondary | ICD-10-CM

## 2018-11-25 DIAGNOSIS — Z853 Personal history of malignant neoplasm of breast: Secondary | ICD-10-CM

## 2018-11-25 DIAGNOSIS — M5136 Other intervertebral disc degeneration, lumbar region: Secondary | ICD-10-CM

## 2018-11-25 DIAGNOSIS — Z8679 Personal history of other diseases of the circulatory system: Secondary | ICD-10-CM

## 2018-11-25 DIAGNOSIS — M0579 Rheumatoid arthritis with rheumatoid factor of multiple sites without organ or systems involvement: Secondary | ICD-10-CM | POA: Diagnosis not present

## 2018-11-25 DIAGNOSIS — Z8719 Personal history of other diseases of the digestive system: Secondary | ICD-10-CM

## 2018-11-25 DIAGNOSIS — M19041 Primary osteoarthritis, right hand: Secondary | ICD-10-CM | POA: Diagnosis not present

## 2018-11-25 DIAGNOSIS — M5030 Other cervical disc degeneration, unspecified cervical region: Secondary | ICD-10-CM

## 2018-11-25 DIAGNOSIS — M51369 Other intervertebral disc degeneration, lumbar region without mention of lumbar back pain or lower extremity pain: Secondary | ICD-10-CM

## 2018-11-25 DIAGNOSIS — Z8709 Personal history of other diseases of the respiratory system: Secondary | ICD-10-CM

## 2018-11-25 DIAGNOSIS — Z8639 Personal history of other endocrine, nutritional and metabolic disease: Secondary | ICD-10-CM

## 2018-11-25 DIAGNOSIS — Z87891 Personal history of nicotine dependence: Secondary | ICD-10-CM

## 2018-11-25 DIAGNOSIS — Z1231 Encounter for screening mammogram for malignant neoplasm of breast: Secondary | ICD-10-CM

## 2018-11-25 DIAGNOSIS — Z9889 Other specified postprocedural states: Secondary | ICD-10-CM

## 2018-11-25 DIAGNOSIS — M19072 Primary osteoarthritis, left ankle and foot: Secondary | ICD-10-CM

## 2018-11-25 DIAGNOSIS — M19042 Primary osteoarthritis, left hand: Secondary | ICD-10-CM

## 2018-11-25 LAB — CBC WITH DIFFERENTIAL/PLATELET
ABSOLUTE MONOCYTES: 774 {cells}/uL (ref 200–950)
BASOS PCT: 1.6 %
Basophils Absolute: 102 cells/uL (ref 0–200)
EOS ABS: 557 {cells}/uL — AB (ref 15–500)
Eosinophils Relative: 8.7 %
HCT: 42.6 % (ref 35.0–45.0)
Hemoglobin: 14.4 g/dL (ref 11.7–15.5)
LYMPHS ABS: 1504 {cells}/uL (ref 850–3900)
MCH: 31.8 pg (ref 27.0–33.0)
MCHC: 33.8 g/dL (ref 32.0–36.0)
MCV: 94 fL (ref 80.0–100.0)
MONOS PCT: 12.1 %
MPV: 9.4 fL (ref 7.5–12.5)
NEUTROS PCT: 54.1 %
Neutro Abs: 3462 cells/uL (ref 1500–7800)
PLATELETS: 289 10*3/uL (ref 140–400)
RBC: 4.53 10*6/uL (ref 3.80–5.10)
RDW: 13 % (ref 11.0–15.0)
TOTAL LYMPHOCYTE: 23.5 %
WBC: 6.4 10*3/uL (ref 3.8–10.8)

## 2018-11-25 LAB — COMPLETE METABOLIC PANEL WITH GFR
AG RATIO: 1.5 (calc) (ref 1.0–2.5)
ALT: 26 U/L (ref 6–29)
AST: 34 U/L (ref 10–35)
Albumin: 3.8 g/dL (ref 3.6–5.1)
Alkaline phosphatase (APISO): 67 U/L (ref 33–130)
BUN: 19 mg/dL (ref 7–25)
CALCIUM: 9.4 mg/dL (ref 8.6–10.4)
CHLORIDE: 100 mmol/L (ref 98–110)
CO2: 30 mmol/L (ref 20–32)
Creat: 0.64 mg/dL (ref 0.60–0.93)
GFR, EST NON AFRICAN AMERICAN: 88 mL/min/{1.73_m2} (ref >=60)
GFR, Est African American: 102 mL/min/{1.73_m2} (ref >=60)
Globulin: 2.6 g/dL (calc) (ref 1.9–3.7)
Glucose, Bld: 92 mg/dL (ref 65–99)
POTASSIUM: 4.4 mmol/L (ref 3.5–5.3)
Sodium: 138 mmol/L (ref 135–146)
TOTAL PROTEIN: 6.4 g/dL (ref 6.1–8.1)
Total Bilirubin: 0.6 mg/dL (ref 0.2–1.2)

## 2018-11-25 MED ORDER — HYDROXYCHLOROQUINE SULFATE 200 MG PO TABS: ORAL_TABLET | ORAL | 0 refills | Status: DC

## 2018-11-25 MED ORDER — METHOTREXATE 2.5 MG PO TABS: 12.5000 mg | ORAL_TABLET | ORAL | 0 refills | Status: DC

## 2018-11-25 NOTE — Telephone Encounter (Signed)
I already sent refill

## 2018-11-26 NOTE — Progress Notes (Signed)
CBC and CMP WNL

## 2018-12-02 ENCOUNTER — Ambulatory Visit
Admission: RE | Admit: 2018-12-02 | Discharge: 2018-12-02 | Disposition: A | Discharge status: Home / Self Care | Payer: Medicare Other | Source: Ambulatory Visit | Attending: Family Medicine | Admitting: Family Medicine

## 2018-12-02 DIAGNOSIS — Z1231 Encounter for screening mammogram for malignant neoplasm of breast: Secondary | ICD-10-CM

## 2018-12-10 LAB — CUP PACEART REMOTE DEVICE CHECK
Battery Remaining Longevity: 73 mo
Battery Voltage: 3.02 V
Brady Statistic AP VP Percent: 0.05 %
Brady Statistic AP VS Percent: 0.01 %
Brady Statistic AS VP Percent: 99.91 %
Brady Statistic AS VS Percent: 0.02 %
Brady Statistic RA Percent Paced: 0.07 %
Brady Statistic RV Percent Paced: 99.95 %
Date Time Interrogation Session: 20191210050710
Implantable Lead Implant Date: 20170818
Implantable Lead Implant Date: 20170818
Implantable Lead Location: 753858
Implantable Lead Location: 753859
Implantable Lead Location: 753860
Implantable Lead Model: 4396
Implantable Lead Model: 5076
Implantable Lead Model: 5076
Implantable Pulse Generator Implant Date: 20170818
Lead Channel Impedance Value: 342 Ohm
Lead Channel Impedance Value: 437 Ohm
Lead Channel Impedance Value: 475 Ohm
Lead Channel Impedance Value: 494 Ohm
Lead Channel Impedance Value: 513 Ohm
Lead Channel Impedance Value: 532 Ohm
Lead Channel Impedance Value: 570 Ohm
Lead Channel Impedance Value: 836 Ohm
Lead Channel Pacing Threshold Amplitude: 0.5 V
Lead Channel Pacing Threshold Amplitude: 0.5 V
Lead Channel Pacing Threshold Pulse Width: 0.4 ms
Lead Channel Pacing Threshold Pulse Width: 0.4 ms
Lead Channel Sensing Intrinsic Amplitude: 1.125 mV
Lead Channel Sensing Intrinsic Amplitude: 1.125 mV
Lead Channel Sensing Intrinsic Amplitude: 11.875 mV
Lead Channel Sensing Intrinsic Amplitude: 11.875 mV
Lead Channel Setting Pacing Amplitude: 1.5 V
Lead Channel Setting Pacing Amplitude: 2 V
Lead Channel Setting Pacing Pulse Width: 0.4 ms
Lead Channel Setting Pacing Pulse Width: 0.4 ms
Lead Channel Setting Sensing Sensitivity: 0.9 mV
MDC IDC LEAD IMPLANT DT: 20170818
MDC IDC MSMT LEADCHNL LV IMPEDANCE VALUE: 627 Ohm
MDC IDC MSMT LEADCHNL RV PACING THRESHOLD AMPLITUDE: 0.375 V
MDC IDC MSMT LEADCHNL RV PACING THRESHOLD PULSEWIDTH: 0.4 ms
MDC IDC SET LEADCHNL RA PACING AMPLITUDE: 1.5 V

## 2019-01-25 ENCOUNTER — Ambulatory Visit (INDEPENDENT_AMBULATORY_CARE_PROVIDER_SITE_OTHER): Payer: Medicare Other | Admitting: *Deleted

## 2019-01-25 DIAGNOSIS — I255 Ischemic cardiomyopathy: Secondary | ICD-10-CM

## 2019-01-25 DIAGNOSIS — I442 Atrioventricular block, complete: Secondary | ICD-10-CM

## 2019-01-26 LAB — CUP PACEART REMOTE DEVICE CHECK
Battery Remaining Longevity: 69 mo
Battery Voltage: 3.01 V
Brady Statistic AP VP Percent: 0.06 %
Brady Statistic AP VS Percent: 0.02 %
Brady Statistic AS VP Percent: 99.92 %
Brady Statistic AS VS Percent: 0.01 %
Brady Statistic RA Percent Paced: 0.08 %
Brady Statistic RV Percent Paced: 99.96 %
Date Time Interrogation Session: 20200310073812
Implantable Lead Implant Date: 20170818
Implantable Lead Implant Date: 20170818
Implantable Lead Location: 753859
Implantable Lead Location: 753860
Implantable Lead Model: 4396
Implantable Lead Model: 5076
Implantable Lead Model: 5076
Implantable Pulse Generator Implant Date: 20170818
Lead Channel Impedance Value: 323 Ohm
Lead Channel Impedance Value: 380 Ohm
Lead Channel Impedance Value: 456 Ohm
Lead Channel Impedance Value: 456 Ohm
Lead Channel Impedance Value: 456 Ohm
Lead Channel Impedance Value: 532 Ohm
Lead Channel Impedance Value: 570 Ohm
Lead Channel Impedance Value: 779 Ohm
Lead Channel Pacing Threshold Amplitude: 0.5 V
Lead Channel Pacing Threshold Amplitude: 0.625 V
Lead Channel Pacing Threshold Pulse Width: 0.4 ms
Lead Channel Pacing Threshold Pulse Width: 0.4 ms
Lead Channel Pacing Threshold Pulse Width: 0.4 ms
Lead Channel Sensing Intrinsic Amplitude: 0.875 mV
Lead Channel Sensing Intrinsic Amplitude: 0.875 mV
Lead Channel Sensing Intrinsic Amplitude: 11.875 mV
Lead Channel Sensing Intrinsic Amplitude: 11.875 mV
Lead Channel Setting Pacing Amplitude: 1.5 V
Lead Channel Setting Pacing Amplitude: 1.75 V
Lead Channel Setting Pacing Amplitude: 2 V
Lead Channel Setting Pacing Pulse Width: 0.4 ms
Lead Channel Setting Pacing Pulse Width: 0.4 ms
Lead Channel Setting Sensing Sensitivity: 0.9 mV
MDC IDC LEAD IMPLANT DT: 20170818
MDC IDC LEAD LOCATION: 753858
MDC IDC MSMT LEADCHNL LV IMPEDANCE VALUE: 513 Ohm
MDC IDC MSMT LEADCHNL RV PACING THRESHOLD AMPLITUDE: 0.5 V

## 2019-02-02 NOTE — Progress Notes (Signed)
Remote pacemaker transmission.   

## 2019-02-11 ENCOUNTER — Other Ambulatory Visit: Payer: Self-pay | Admitting: Physician Assistant

## 2019-02-11 NOTE — Telephone Encounter (Signed)
Last Visit: 11/25/2018 Next Visit: 04/28/2019 Labs: 11/25/2018 WNL  Eye exam: 02/18/2018  Okay to refill per Dr. Estanislado Pandy.

## 2019-02-23 ENCOUNTER — Other Ambulatory Visit: Payer: Self-pay | Admitting: Physician Assistant

## 2019-02-24 NOTE — Telephone Encounter (Signed)
Last Visit: 11/25/2018 Next Visit: 04/28/2019 Labs: 11/25/2018 WNL    Okay to refill per Dr. Estanislado Pandy.

## 2019-03-29 ENCOUNTER — Telehealth: Payer: Self-pay | Admitting: Interventional Cardiology

## 2019-03-29 NOTE — Telephone Encounter (Signed)
Attempted to contact pt in regards to appt with Dr. Tamala Julian on 5/27.  Left message to call back.  Need to change to a virtual visit using Doximity if possible.  Was going to offer 5/27 at [place] or 10:30A, whichever was available. Please send to me or Normal Miller to schedule.

## 2019-03-29 NOTE — Telephone Encounter (Signed)
Left message to call back  

## 2019-03-29 NOTE — Telephone Encounter (Signed)
New Message   Patient has computer and smart phone to do virtual visit please call her to get consent and to setup.  Patient prefers 10:30am.

## 2019-04-05 NOTE — Telephone Encounter (Signed)
Spoke with pt and she was agreeable to try a virtual visit with Dr. Tamala Julian.  She recently got a new phone so not sure she will be able to do it.  Pt agreeable to change to phone visit if virtual fails.  Advised my CMA will call next week to go over everything in detail. Pt verbalized understanding and was appreciative for call. Pt does not have a way of checking vitals at home.

## 2019-04-13 ENCOUNTER — Telehealth: Payer: Self-pay

## 2019-04-13 NOTE — Progress Notes (Signed)
Virtual Visit via Video Note   This visit type was conducted due to national recommendations for restrictions regarding the COVID-19 Pandemic (e.g. social distancing) in an effort to limit this patient's exposure and mitigate transmission in our community.  Due to her co-morbid illnesses, this patient is at least at moderate risk for complications without adequate follow up.  This format is felt to be most appropriate for this patient at this time.  All issues noted in this document were discussed and addressed.  A limited physical exam was performed with this format.  Please refer to the patient's chart for her consent to telehealth for [facility].   Date:  04/14/2019   ID:  [name], DOB 1946-04-25, MRN [id number]  Patient Location: Home Provider Location: Office  PCP:  Darcus Austin, MD (Inactive)  Cardiologist:  Vic Blackbird, III Electrophysiologist:  None   Evaluation Performed:  Follow-Up Visit  Chief Complaint:  CAD  History of Present Illness:    [name] is a 73 y.o. female with history of breast Ca, HTN, RA, depression and recently diagnosed CAD s/p thrombotic occlusion of the LAD s/p thrombectomy (07/04/16), VT and CHB s/p BiV PPM . LVEF 55% by echo 2018.  The patient is doing well.  She does have dyspnea with moderate to heavy activity when she bends and tries to lift and carry.  Also if she is doing any walking up an incline.  She denies chest discomfort.  She is not having orthopnea.  Overall she is quite pleased with how she is doing.  She lives independently.  Recently her daughter who is a CNA recorded blood pressures of 138-142/68-75 mmHg.  She is not restricting salt in her diet.  The patient does not have symptoms concerning for COVID-19 infection (fever, chills, cough, or new shortness of breath).    Past Medical History:  Diagnosis Date  . Anxiety   . Asthma   . Breast cancer ([place])    left breast  . Carpal tunnel syndrome   . Depression    . Habitual alcohol use   . Heart attack ([place])   . Hypertension   . Personal history of chemotherapy   . Personal history of radiation therapy   . RA (rheumatoid arthritis) ([place])   . Spinal stenosis   . Vision abnormalities    Past Surgical History:  Procedure Laterality Date  . BREAST BIOPSY    . BREAST LUMPECTOMY  2005   left  . CARDIAC CATHETERIZATION N/A 07/04/2016   Procedure: Left Heart Cath and Coronary Angiography;  Surgeon: Belva Crome, MD;  Location: [facility];  Service: Cardiovascular;  Laterality: N/A;  . CARDIAC CATHETERIZATION N/A 07/04/2016   Procedure: Temporary Pacemaker;  Surgeon: Belva Crome, MD;  Location: [facility];  Service: Cardiovascular;  Laterality: N/A;  . CARPAL TUNNEL RELEASE    . CATARACT EXTRACTION Bilateral   . EP IMPLANTABLE DEVICE N/A 07/05/2016   Procedure: Pacemaker Implant;  Surgeon: Evans Lance, MD;  Location: [facility];  Service: Cardiovascular;  Laterality: N/A;  . [place]  . LYMPHECTOMY  2005     No outpatient medications have been marked as taking for the 04/14/19 encounter (Appointment) with Belva Crome, MD.     Allergies:   Betadine [povidone iodine] and Hydrocodone   Social History   Tobacco Use  . Smoking status: Former Smoker    Packs/day: 1.50    Years: 30.00  Pack years: 45.00    Types: Cigarettes    Last attempt to quit: 1995    Years since quitting: 25.4  . Smokeless tobacco: Never Used  Substance Use Topics  . Alcohol use: Yes    Alcohol/week: 14.0 standard drinks    Types: 14 Cans of beer per week    Comment: 2 DAILY  . Drug use: Never     Family Hx: The patient's family history includes Cancer in her father, paternal aunt, and paternal uncle; Pyelonephritis in her sister; Stroke in her mother.  ROS:   Please see the history of present illness.    She has irritable bowel syndrome.  Recently started on dicyclomine. All other systems reviewed and are negative.    Prior CV studies:   The following studies were reviewed today:  CARDIAC CATH REPORT 2017:  High-grade AV block with syncope and polymorphic ventricular tachycardia (nonsustained - possibly bradycardia dependent). Heavy mitral annular calcification suggests that AV block is chronic and degenerative in nature. The patient will likely require a permanent pacemaker.  Temporary pacemaker implantation via right femoral approach.  Total occlusion of the mid LAD likely related to a thromboembolic event complicating catheterization. Onset of chest discomfort was temporarily related to the first left coronary angiogram.  Successful catheter-based thrombectomy of the mid LAD reducing 100% stenosis to less than 10% with TIMI grade 3 flow. Stenting was not required.  Otherwise widely patent/normal coronary arteries.  Mid anterior wall hypokinesis with EF 40-50%. (Interestingly, the ventriculogram was performed prior to left coronary angiography).  ECHOCARDIOGRAM 2018: Study Conclusions  - Left ventricle: The cavity size was normal. Wall thickness was   normal. Systolic function was normal. The estimated ejection   fraction was in the range of 55% to 60%. Wall motion was normal;   there were no regional wall motion abnormalities. Doppler   parameters are consistent with abnormal left ventricular   relaxation (grade 1 diastolic dysfunction). - Mitral valve: Moderately calcified annulus. - Left atrium: The atrium was mildly dilated.  Labs/Other Tests and Data Reviewed:    EKG:  No ECG reviewed.  Recent Labs: 11/25/2018: ALT 26; BUN 19; Creat 0.64; Hemoglobin 14.4; Platelets 289; Potassium 4.4; Sodium 138   Recent Lipid Panel Lab Results  Component Value Date/Time   CHOL 166 07/05/2016 03:13 AM   TRIG 88 07/05/2016 03:13 AM   HDL 54 07/05/2016 03:13 AM   CHOLHDL 3.1 07/05/2016 03:13 AM   LDLCALC 94 07/05/2016 03:13 AM    Wt Readings from Last 3 Encounters:  11/25/18 140 lb 6.4 oz (63.7  kg)  07/10/18 139 lb 3.2 oz (63.1 kg)  06/17/18 140 lb (63.5 kg)     Objective:    Vital Signs:  There were no vitals taken for this visit.   VITAL SIGNS:  reviewed  ASSESSMENT & PLAN:    1. Stenosis of left subclavian artery (HCC)   2. Pacemaker-MDT BiV placed 07/05/16   3. CAD- S/P LAD thrombectomy 07/04/16   4. Systolic congestive heart failure, unspecified HF chronicity ([place])   5. LBBB (left bundle branch block)   6. Essential hypertension   7. Educated About Covid-19 Virus Infection    Plan:  1. No symptoms to suggest subclavian stenosis/subclavian steal. 2. Been recently evaluated by the pacemaker was functioning normally. 3. No coronary symptoms.  She had an intraprocedural embolic occlusion of the LAD that was treated with "Dotter".  No recurrent angina. 4. Systolic heart failure improved to normal systolic function on medical  therapy (HFr EF) will maintain the patient on guideline directed therapy to prevent recurrent dysfunction. 5. Not addressed 6. Discussed blood pressure target of 130/80 mmHg.  Discussed salt restriction.  COVID-19 Education: The signs and symptoms of COVID-19 were discussed with the patient and how to seek care for testing (follow up with PCP or arrange E-visit).  The importance of social distancing was discussed today.  Time:   Today, I have spent 15 minutes with the patient with telehealth technology discussing the above problems.     Medication Adjustments/Labs and Tests Ordered: Current medicines are reviewed at length with the patient today.  Concerns regarding medicines are outlined above.   Tests Ordered: No orders of the defined types were placed in this encounter.   Medication Changes: No orders of the defined types were placed in this encounter.   Disposition:  Follow up in 6 month(s)  Signed, Sinclair Grooms, MD  04/14/2019 10:19 AM    [place]

## 2019-04-13 NOTE — Telephone Encounter (Signed)
LM for pt to return my call to go over instructions for Virtual visit with Dr Tamala Julian tomorrow. Also, need to document consent.

## 2019-04-14 ENCOUNTER — Other Ambulatory Visit: Payer: Self-pay

## 2019-04-14 ENCOUNTER — Telehealth (INDEPENDENT_AMBULATORY_CARE_PROVIDER_SITE_OTHER): Payer: Medicare Other | Admitting: Interventional Cardiology

## 2019-04-14 ENCOUNTER — Encounter: Payer: Self-pay | Admitting: Interventional Cardiology

## 2019-04-14 VITALS — BP 135/75

## 2019-04-14 DIAGNOSIS — I10 Essential (primary) hypertension: Secondary | ICD-10-CM

## 2019-04-14 DIAGNOSIS — I771 Stricture of artery: Secondary | ICD-10-CM | POA: Diagnosis not present

## 2019-04-14 DIAGNOSIS — Z950 Presence of cardiac pacemaker: Secondary | ICD-10-CM

## 2019-04-14 DIAGNOSIS — Z9861 Coronary angioplasty status: Secondary | ICD-10-CM

## 2019-04-14 DIAGNOSIS — I5020 Unspecified systolic (congestive) heart failure: Secondary | ICD-10-CM

## 2019-04-14 DIAGNOSIS — I2510 Atherosclerotic heart disease of native coronary artery without angina pectoris: Secondary | ICD-10-CM

## 2019-04-14 DIAGNOSIS — I447 Left bundle-branch block, unspecified: Secondary | ICD-10-CM

## 2019-04-14 DIAGNOSIS — Z7189 Other specified counseling: Secondary | ICD-10-CM

## 2019-04-14 NOTE — Progress Notes (Signed)
Office Visit Note  Patient: [name]             Date of Birth: 06-23-1946           MRN: [id number]             PCP: Darcus Austin, MD (Inactive) Referring: Darcus Austin, MD Visit Date: 04/28/2019 Occupation: @GUAROCC @  Subjective:  Neck pain    History of Present Illness: [name] is a 73 y.o. female with history of seropositive rheumatoid arthritis, osteoarthritis, and DDD.  Patient is taking methotrexate 5 tablets by mouth every Sunday, folic acid 1 mg by mouth daily, Plaquenil 200 mg twice daily Monday through Friday.  She has not missed any doses of methotrexate or Plaquenil recently.  She denies any recent rheumatoid arthritis flares.  She states that she experiences stiffness and has noticed decreased grip strength in bilateral hands.  She denies any pain or swelling in her hands at this time.  She has chronic neck pain and experiences bilateral radiculopathy.  She reports that she has been evaluated by a neurologist as well as a Publishing rights manager.  She states that she is not a candidate for surgery.  She is unable to get an MRI of her neck due to having a pacemaker.  She has declined physical therapy at this time.  She reports that she remains active and has been walking for exercise daily and participating in silver sneaker videos.   She reports that she will be scheduling her Plaquenil eye exam soon.   Activities of Daily Living:  Patient reports morning stiffness for 0 none.   Patient Denies nocturnal pain.  Difficulty dressing/grooming: Denies Difficulty climbing stairs: Denies Difficulty getting out of chair: Denies Difficulty using hands for taps, buttons, cutlery, and/or writing: Reports  Review of Systems  Constitutional: Negative for fatigue.  HENT: Positive for mouth dryness.   Eyes: Negative for dryness.  Respiratory: Negative for shortness of breath.   Cardiovascular: Positive for swelling in legs/feet.  Gastrointestinal: Negative for constipation.   Endocrine: Negative for increased urination.  Genitourinary: Negative for difficulty urinating.  Musculoskeletal: Positive for arthralgias, joint pain and muscle weakness.  Skin: Negative for rash.  Allergic/Immunologic: Negative for susceptible to infections.  Neurological: Positive for numbness and weakness.  Hematological: Positive for bruising/bleeding tendency.  Psychiatric/Behavioral: Negative for sleep disturbance.    PMFS History:  Patient Active Problem List   Diagnosis Date Noted  . Bilateral carpal tunnel syndrome 04/14/2018  . Right arm pain 01/07/2018  . Cervical spinal stenosis 01/07/2018  . Cervical radiculopathy 01/07/2018  . Stenosis of left subclavian artery ([place]) 12/15/2017  . History of asthma 06/25/2017  . History of gastroesophageal reflux (GERD) 06/25/2017  . History of anxiety 06/25/2017  . History of bilateral carpal tunnel release 06/25/2017  . Former smoker 01/25/2017  . Rheumatoid arthritis involving multiple sites with positive rheumatoid factor ([place]) 01/17/2017  . High risk medication use 01/17/2017  . Primary osteoarthritis of both hands 01/17/2017  . Primary osteoarthritis of both feet 01/17/2017  . DJD (degenerative joint disease), cervical 01/17/2017  . Spondylosis of lumbar region without myelopathy or radiculopathy 01/17/2017  . Syncope and collapse 07/07/2016  . Cardiomyopathy, ischemic 07/07/2016  . CAD- S/P LAD thrombectomy 07/04/16 07/07/2016  . Pacemaker-MDT BiV placed 07/05/16 07/07/2016  . History of rheumatoid arthritis 07/07/2016  . Complete heart block ([place]) 07/04/2016  . Essential hypertension 07/04/2016  . LBBB (left bundle branch block) 07/04/2016  . Ventricular tachycardia ([place]) 07/04/2016  .  Faintness 07/04/2016  . NSTEMI (non-ST elevated myocardial infarction) ([place]) 07/04/2016  . Exostosis 06/18/2011  . History of breast cancer 02/16/2010  . Vitamin D deficiency 02/15/2010  . HYPERCHOLESTEROLEMIA 02/15/2010  . ANXIETY  02/15/2010  . Asthma 02/15/2010  . MIGRAINES, HX OF 02/15/2010  . MASTECTOMY, LEFT, HX OF 02/15/2010    Past Medical History:  Diagnosis Date  . Anxiety   . Asthma   . Breast cancer ([place])    left breast  . Carpal tunnel syndrome   . Depression   . Habitual alcohol use   . Heart attack ([place])   . Hypertension   . Personal history of chemotherapy   . Personal history of radiation therapy   . RA (rheumatoid arthritis) ([place])   . Spinal stenosis   . Vision abnormalities     Family History  Problem Relation Age of Onset  . Stroke Mother   . Cancer Father        colon?  . Pyelonephritis Sister   . Cancer Paternal Aunt   . Cancer Paternal Uncle    Past Surgical History:  Procedure Laterality Date  . BREAST BIOPSY    . BREAST LUMPECTOMY  2005   left  . CARDIAC CATHETERIZATION N/A 07/04/2016   Procedure: Left Heart Cath and Coronary Angiography;  Surgeon: Belva Crome, MD;  Location: [facility];  Service: Cardiovascular;  Laterality: N/A;  . CARDIAC CATHETERIZATION N/A 07/04/2016   Procedure: Temporary Pacemaker;  Surgeon: Belva Crome, MD;  Location: [facility];  Service: Cardiovascular;  Laterality: N/A;  . CARPAL TUNNEL RELEASE    . CATARACT EXTRACTION Bilateral   . EP IMPLANTABLE DEVICE N/A 07/05/2016   Procedure: Pacemaker Implant;  Surgeon: Evans Lance, MD;  Location: [facility];  Service: Cardiovascular;  Laterality: N/A;  . [place]  . LYMPHECTOMY  2005   Social History   Social History Narrative  . Not on file   Immunization History  Administered Date(s) Administered  . Influenza Whole 08/21/2009, 08/29/2010  . Td 01/26/2018     Objective: Vital Signs: BP (!) 116/58 (BP Location: Right Arm, Patient Position: Sitting, Cuff Size: Normal)   Pulse 91   Resp 18   Ht 5\' 2"  (1.575 m)   Wt 141 lb 12.8 oz (64.3 kg)   BMI 25.94 kg/m    Physical Exam Vitals signs and nursing note reviewed.  Constitutional:      Appearance:  She is well-developed.  HENT:     Head: Normocephalic and atraumatic.  Eyes:     Conjunctiva/sclera: Conjunctivae normal.  Neck:     Musculoskeletal: Normal range of motion.  Cardiovascular:     Rate and Rhythm: Normal rate and regular rhythm.     Heart sounds: Normal heart sounds.  Pulmonary:     Effort: Pulmonary effort is normal.     Breath sounds: Normal breath sounds.  Abdominal:     General: Bowel sounds are normal.     Palpations: Abdomen is soft.  Lymphadenopathy:     Cervical: No cervical adenopathy.  Skin:    General: Skin is warm and dry.     Capillary Refill: Capillary refill takes less than 2 seconds.  Neurological:     Mental Status: She is alert and oriented to person, place, and time.  Psychiatric:        Behavior: Behavior normal.      Musculoskeletal Exam: C-spine limited ROM.  Thoracic and lumbar spine good ROM.  No midline spinal tenderness.  Shoulder joints, elbow joints, wrist joints, MCPs, PIPs, and DIPs good ROM with no synovitis. Synovial thickening of bilateral 1st MCP joints, right 2nd and 3rd MCP joints.  PIP and DIP synovial thickening consistent with osteoarthritis of both hands.  Hip joints, knee joints, ankle joints, MTPs, PIPs, and DIPs good ROM with no synovitis.  No warmth or effusion of knee joints.  No tenderness or swelling of ankle joints.    CDAI Exam: CDAI Score: 1  Patient Global Assessment: 5 (mm); Provider Global Assessment: 5 (mm) Swollen: 0 ; Tender: 0  Joint Exam   Not documented   There is currently no information documented on the homunculus. Go to the Rheumatology activity and complete the homunculus joint exam.  Investigation: No additional findings.  Imaging: No results found.  Recent Labs: Lab Results  Component Value Date   WBC 6.4 11/25/2018   HGB 14.4 11/25/2018   PLT 289 11/25/2018   NA 138 11/25/2018   K 4.4 11/25/2018   CL 100 11/25/2018   CO2 30 11/25/2018   GLUCOSE 92 11/25/2018   BUN 19 11/25/2018    CREATININE 0.64 11/25/2018   BILITOT 0.6 11/25/2018   ALKPHOS 66 12/13/2017   AST 34 11/25/2018   ALT 26 11/25/2018   PROT 6.4 11/25/2018   ALBUMIN 3.5 12/13/2017   CALCIUM 9.4 11/25/2018   GFRAA 102 11/25/2018    Speciality Comments: PLQ Eye Exam: 02/18/18 WNL @ [facility] Follow up in 1 year  Procedures:  No procedures performed Allergies: Betadine [povidone iodine] and Hydrocodone     Assessment / Plan:     Visit Diagnoses: Rheumatoid arthritis involving multiple sites with positive rheumatoid factor ([place]) - +RF, +anti-CCP, +ANA: She has no synovitis on exam.  She has not had any recent rheumatoid arthritis flares.  She is clinically doing well on Methotrexate 5 tablets po once weekly, folic acid 1 mg po daily, and PLQ 200 mg BID M-F.  She denies any joint pain or joint swelling at this time.  She has noticed decreased grip strength and stiffness in both hands but has no discomfort at this time.  She will continue on this current treatment regimen.  She does not need any refills at this time.  She was advised to notify us if she develops increased joint pain or joint swelling.  She will follow up in 5 months.    High risk medication use -  She is on methotrexate 5 tablets every 7 days, folic acid 1 mg daily, and Plaquenil 200 mg twice daily Monday through Friday only.  Last Plaquenil eye exam normal on 02/18/2018.  Most recent CBC/CMP within normal limits on 11/25/2018.  Due for CBC/CMP today and will monitor every 3 months.  Standing orders are in place.  We discussed that anytime she has an infection she should hold MTX until the infection has cleared and to restart once the infection has cleared.  The importance of social distancing and following the standard precautions recommended by the CDC were discussed.  - Plan: CBC with Differential/Platelet, COMPLETE METABOLIC PANEL WITH GFR  Primary osteoarthritis of both hands: She has PIP and DIP synovial thickening. She has synovial thickening of  bilateral CMC joints. No synovitis noted. She has complete fist formation bilaterally.  Joint protection and muscle strengthening were discussed.   Primary osteoarthritis of both feet: She has osteoarthritic changes in both feet.  She wears proper fitting shoes and has no discomfort at this time.  History of bilateral carpal tunnel release: She is asymptomatic at this time.   DDD (degenerative disc disease), cervical: Chronic pain.  She has limited ROM with discomfort.  She experiences bilateral radiculopathy.  She has been evaluated by a neurologist and a neurosurgeon.  She is unable to have a MRI due to having a pacemaker.  According to the patient she is not a surgical candidate.  She declined physical therapy at this time.   DDD (degenerative disc disease), lumbar: She has intermittent lower back pain worse with activity.  She has no symptoms of sciatica or radiculopathy.   History of vitamin D deficiency: She is taking vitamin D 2,000 units daily.   Other medical conditions are listed as follows:   History of breast cancer  History of gastroesophageal reflux (GERD)  History of ventricular tachycardia  History of left bundle branch block  History of hyperlipidemia  History of hypertension  History of asthma  History of anxiety  Former smoker   Orders: Orders Placed This Encounter  Procedures  . CBC with Differential/Platelet  . COMPLETE METABOLIC PANEL WITH GFR   No orders of the defined types were placed in this encounter.   Follow-Up Instructions: Return in about 5 months (around 09/28/2019) for Rheumatoid arthritis, Osteoarthritis.   Ofilia Neas, PA-C   I examined and evaluated the patient with Brandy Sams PA.  Patient had no synovitis on examination.  She is continuing to do well on methotrexate and Plaquenil combination.  The plan of care was discussed as noted above.  Bo Merino, MD  Note - This record has been created using Editor, commissioning.   Chart creation errors have been sought, but may not always  have been located. Such creation errors do not reflect on  the standard of medical care.

## 2019-04-14 NOTE — Patient Instructions (Signed)
Medication Instructions:  Your physician recommends that you continue on your current medications as directed. Please refer to the Current Medication list given to you today.  If you need a refill on your cardiac medications before your next appointment, please call your pharmacy.   Lab work: None If you have labs (blood work) drawn today and your tests are completely normal, you will receive your results only by: . MyChart Message (if you have MyChart) OR . A paper copy in the mail If you have any lab test that is abnormal or we need to change your treatment, we will call you to review the results.  Testing/Procedures: None  Follow-Up: At [facility], you and your health needs are our priority.  As part of our continuing mission to provide you with exceptional heart care, we have created designated Provider Care Teams.  These Care Teams include your primary Cardiologist (physician) and Advanced Practice Providers (APPs -  Physician Assistants and Nurse Practitioners) who all work together to provide you with the care you need, when you need it. You will need a follow up appointment in 6 months.  Please call our office 2 months in advance to schedule this appointment.  You may see Dr. Smith or one of the following Advanced Practice Providers on your designated Care Team:   Lori Gerhardt, NP Laura Ingold, NP . Jill McDaniel, NP  Any Other Special Instructions Will Be Listed Below (If Applicable).    

## 2019-04-15 NOTE — Telephone Encounter (Signed)
Pt called back for her visit.

## 2019-04-26 ENCOUNTER — Ambulatory Visit (INDEPENDENT_AMBULATORY_CARE_PROVIDER_SITE_OTHER): Payer: Medicare Other | Admitting: *Deleted

## 2019-04-26 DIAGNOSIS — I255 Ischemic cardiomyopathy: Secondary | ICD-10-CM

## 2019-04-26 DIAGNOSIS — I442 Atrioventricular block, complete: Secondary | ICD-10-CM | POA: Diagnosis not present

## 2019-04-27 LAB — CUP PACEART REMOTE DEVICE CHECK
Battery Remaining Longevity: 69 mo
Battery Voltage: 3.01 V
Brady Statistic AP VP Percent: 0.06 %
Brady Statistic AP VS Percent: 0.02 %
Brady Statistic AS VP Percent: 99.91 %
Brady Statistic AS VS Percent: 0.01 %
Brady Statistic RA Percent Paced: 0.09 %
Brady Statistic RV Percent Paced: 99.97 %
Date Time Interrogation Session: 20200609074113
Implantable Lead Implant Date: 20170818
Implantable Lead Implant Date: 20170818
Implantable Lead Implant Date: 20170818
Implantable Lead Location: 753858
Implantable Lead Location: 753859
Implantable Lead Location: 753860
Implantable Lead Model: 4396
Implantable Lead Model: 5076
Implantable Lead Model: 5076
Implantable Pulse Generator Implant Date: 20170818
Lead Channel Impedance Value: 361 Ohm
Lead Channel Impedance Value: 475 Ohm
Lead Channel Impedance Value: 494 Ohm
Lead Channel Impedance Value: 513 Ohm
Lead Channel Impedance Value: 532 Ohm
Lead Channel Impedance Value: 551 Ohm
Lead Channel Impedance Value: 570 Ohm
Lead Channel Impedance Value: 627 Ohm
Lead Channel Impedance Value: 836 Ohm
Lead Channel Pacing Threshold Amplitude: 0.5 V
Lead Channel Pacing Threshold Amplitude: 0.5 V
Lead Channel Pacing Threshold Amplitude: 0.5 V
Lead Channel Pacing Threshold Pulse Width: 0.4 ms
Lead Channel Pacing Threshold Pulse Width: 0.4 ms
Lead Channel Pacing Threshold Pulse Width: 0.4 ms
Lead Channel Sensing Intrinsic Amplitude: 1.5 mV
Lead Channel Sensing Intrinsic Amplitude: 1.5 mV
Lead Channel Sensing Intrinsic Amplitude: 11.875 mV
Lead Channel Sensing Intrinsic Amplitude: 11.875 mV
Lead Channel Setting Pacing Amplitude: 1.5 V
Lead Channel Setting Pacing Amplitude: 1.5 V
Lead Channel Setting Pacing Amplitude: 2 V
Lead Channel Setting Pacing Pulse Width: 0.4 ms
Lead Channel Setting Pacing Pulse Width: 0.4 ms
Lead Channel Setting Sensing Sensitivity: 0.9 mV

## 2019-04-28 ENCOUNTER — Other Ambulatory Visit: Payer: Self-pay

## 2019-04-28 ENCOUNTER — Encounter: Payer: Self-pay | Admitting: Rheumatology

## 2019-04-28 ENCOUNTER — Ambulatory Visit: Payer: Medicare Other | Admitting: Rheumatology

## 2019-04-28 VITALS — BP 116/58 | HR 91 | Resp 18 | Ht 62.0 in | Wt 141.8 lb

## 2019-04-28 DIAGNOSIS — M5136 Other intervertebral disc degeneration, lumbar region: Secondary | ICD-10-CM

## 2019-04-28 DIAGNOSIS — M0579 Rheumatoid arthritis with rheumatoid factor of multiple sites without organ or systems involvement: Secondary | ICD-10-CM | POA: Diagnosis not present

## 2019-04-28 DIAGNOSIS — Z79899 Other long term (current) drug therapy: Secondary | ICD-10-CM | POA: Diagnosis not present

## 2019-04-28 DIAGNOSIS — M19041 Primary osteoarthritis, right hand: Secondary | ICD-10-CM | POA: Diagnosis not present

## 2019-04-28 DIAGNOSIS — Z8679 Personal history of other diseases of the circulatory system: Secondary | ICD-10-CM

## 2019-04-28 DIAGNOSIS — M51369 Other intervertebral disc degeneration, lumbar region without mention of lumbar back pain or lower extremity pain: Secondary | ICD-10-CM

## 2019-04-28 DIAGNOSIS — Z9889 Other specified postprocedural states: Secondary | ICD-10-CM

## 2019-04-28 DIAGNOSIS — Z8719 Personal history of other diseases of the digestive system: Secondary | ICD-10-CM

## 2019-04-28 DIAGNOSIS — M19071 Primary osteoarthritis, right ankle and foot: Secondary | ICD-10-CM | POA: Diagnosis not present

## 2019-04-28 DIAGNOSIS — M19072 Primary osteoarthritis, left ankle and foot: Secondary | ICD-10-CM

## 2019-04-28 DIAGNOSIS — M5030 Other cervical disc degeneration, unspecified cervical region: Secondary | ICD-10-CM

## 2019-04-28 DIAGNOSIS — Z853 Personal history of malignant neoplasm of breast: Secondary | ICD-10-CM

## 2019-04-28 DIAGNOSIS — Z8659 Personal history of other mental and behavioral disorders: Secondary | ICD-10-CM

## 2019-04-28 DIAGNOSIS — M19042 Primary osteoarthritis, left hand: Secondary | ICD-10-CM

## 2019-04-28 DIAGNOSIS — Z8709 Personal history of other diseases of the respiratory system: Secondary | ICD-10-CM

## 2019-04-28 DIAGNOSIS — Z8639 Personal history of other endocrine, nutritional and metabolic disease: Secondary | ICD-10-CM

## 2019-04-28 DIAGNOSIS — Z87891 Personal history of nicotine dependence: Secondary | ICD-10-CM

## 2019-04-29 LAB — CBC WITH DIFFERENTIAL/PLATELET
Absolute Monocytes: 845 {cells}/uL (ref 200–950)
Basophils Absolute: 57 {cells}/uL (ref 0–200)
Basophils Relative: 0.8 %
Eosinophils Absolute: 618 {cells}/uL — ABNORMAL HIGH (ref 15–500)
Eosinophils Relative: 8.7 %
HCT: 41.6 % (ref 35.0–45.0)
Hemoglobin: 13.7 g/dL (ref 11.7–15.5)
Lymphs Abs: 1498 {cells}/uL (ref 850–3900)
MCH: 31.6 pg (ref 27.0–33.0)
MCHC: 32.9 g/dL (ref 32.0–36.0)
MCV: 96.1 fL (ref 80.0–100.0)
MPV: 9.3 fL (ref 7.5–12.5)
Monocytes Relative: 11.9 %
Neutro Abs: 4083 {cells}/uL (ref 1500–7800)
Neutrophils Relative %: 57.5 %
Platelets: 256 Thousand/uL (ref 140–400)
RBC: 4.33 Million/uL (ref 3.80–5.10)
RDW: 13.2 % (ref 11.0–15.0)
Total Lymphocyte: 21.1 %
WBC: 7.1 Thousand/uL (ref 3.8–10.8)

## 2019-04-29 LAB — COMPLETE METABOLIC PANEL WITHOUT GFR
AG Ratio: 1.5 (calc) (ref 1.0–2.5)
ALT: 26 U/L (ref 6–29)
AST: 32 U/L (ref 10–35)
Albumin: 3.6 g/dL (ref 3.6–5.1)
Alkaline phosphatase (APISO): 69 U/L (ref 37–153)
BUN: 15 mg/dL (ref 7–25)
CO2: 29 mmol/L (ref 20–32)
Calcium: 8.9 mg/dL (ref 8.6–10.4)
Chloride: 103 mmol/L (ref 98–110)
Creat: 0.62 mg/dL (ref 0.60–0.93)
GFR, Est African American: 102 mL/min/1.73m2
GFR, Est Non African American: 88 mL/min/1.73m2
Globulin: 2.4 g/dL (ref 1.9–3.7)
Glucose, Bld: 93 mg/dL (ref 65–99)
Potassium: 4.1 mmol/L (ref 3.5–5.3)
Sodium: 141 mmol/L (ref 135–146)
Total Bilirubin: 0.4 mg/dL (ref 0.2–1.2)
Total Protein: 6 g/dL — ABNORMAL LOW (ref 6.1–8.1)

## 2019-04-29 NOTE — Progress Notes (Signed)
Absolute eosinophils are elevated but stable. Rest of CBC WNL.   CMP WNL

## 2019-05-05 NOTE — Progress Notes (Signed)
Remote pacemaker transmission.   

## 2019-05-07 ENCOUNTER — Other Ambulatory Visit: Payer: Self-pay | Admitting: Interventional Cardiology

## 2019-05-17 ENCOUNTER — Other Ambulatory Visit: Payer: Self-pay | Admitting: Rheumatology

## 2019-05-17 DIAGNOSIS — M0579 Rheumatoid arthritis with rheumatoid factor of multiple sites without organ or systems involvement: Secondary | ICD-10-CM

## 2019-05-17 NOTE — Telephone Encounter (Addendum)
Last Visit: 04/28/19 Next Visit: 10/25/19 Labs: 04/28/19 Absolute eosinophils are elevated but stable. Rest of CBC WNL. CMP WNL PLQ Eye Exam: 02/18/18 WNL  Left message to advise patient she is due to update PLQ eye exam  Okay to refill 30 day PLQ per Dr. Estanislado Pandy

## 2019-05-20 ENCOUNTER — Telehealth: Payer: Self-pay | Admitting: Rheumatology

## 2019-05-20 NOTE — Telephone Encounter (Signed)
Patient left a voicemail stating she has scheduled an appointment in September to have her Plaquenil eye exam which is the earliest date she could get.  Patient states she has the correct form and will have them fill it out and fax to the office.

## 2019-05-20 NOTE — Telephone Encounter (Signed)
Noted  

## 2019-05-24 ENCOUNTER — Other Ambulatory Visit: Payer: Self-pay | Admitting: Rheumatology

## 2019-05-24 NOTE — Telephone Encounter (Signed)
Last Visit: 04/28/19 Next Visit: 10/25/19 Labs: 04/28/19 Absolute eosinophils are elevated but stable. Rest of CBC WNL. CMP WNL  Okay to refill per Dr. Estanislado Pandy

## 2019-06-24 ENCOUNTER — Other Ambulatory Visit: Payer: Self-pay | Admitting: Rheumatology

## 2019-06-24 DIAGNOSIS — M0579 Rheumatoid arthritis with rheumatoid factor of multiple sites without organ or systems involvement: Secondary | ICD-10-CM

## 2019-06-24 NOTE — Telephone Encounter (Signed)
Last Visit: 04/28/19 Next Visit: 10/25/19 Labs: 04/28/19 Absolute eosinophils are elevated but stable. Rest of CBC WNL. CMP WNL PLQ Eye Exam: 02/18/18 WNL  Eye exam is scheduled for September 2020  Okay to refill 30 day PLQ per Dr. Estanislado Pandy

## 2019-07-27 ENCOUNTER — Ambulatory Visit (INDEPENDENT_AMBULATORY_CARE_PROVIDER_SITE_OTHER): Payer: Medicare Other | Admitting: *Deleted

## 2019-07-27 ENCOUNTER — Other Ambulatory Visit: Payer: Self-pay | Admitting: Rheumatology

## 2019-07-27 DIAGNOSIS — I5020 Unspecified systolic (congestive) heart failure: Secondary | ICD-10-CM

## 2019-07-27 DIAGNOSIS — I255 Ischemic cardiomyopathy: Secondary | ICD-10-CM

## 2019-07-27 DIAGNOSIS — I442 Atrioventricular block, complete: Secondary | ICD-10-CM | POA: Diagnosis not present

## 2019-07-27 DIAGNOSIS — M0579 Rheumatoid arthritis with rheumatoid factor of multiple sites without organ or systems involvement: Secondary | ICD-10-CM

## 2019-07-27 LAB — CUP PACEART REMOTE DEVICE CHECK
Battery Remaining Longevity: 70 mo
Battery Voltage: 3.01 V
Brady Statistic AP VP Percent: 0.08 %
Brady Statistic AP VS Percent: 0.02 %
Brady Statistic AS VP Percent: 99.89 %
Brady Statistic AS VS Percent: 0 %
Brady Statistic RA Percent Paced: 0.1 %
Brady Statistic RV Percent Paced: 99.97 %
Date Time Interrogation Session: 20200908192204
Implantable Lead Implant Date: 20170818
Implantable Lead Implant Date: 20170818
Implantable Lead Implant Date: 20170818
Implantable Lead Location: 753858
Implantable Lead Location: 753859
Implantable Lead Location: 753860
Implantable Lead Model: 4396
Implantable Lead Model: 5076
Implantable Lead Model: 5076
Implantable Pulse Generator Implant Date: 20170818
Lead Channel Impedance Value: 342 Ohm
Lead Channel Impedance Value: 456 Ohm
Lead Channel Impedance Value: 494 Ohm
Lead Channel Impedance Value: 513 Ohm
Lead Channel Impedance Value: 570 Ohm
Lead Channel Impedance Value: 589 Ohm
Lead Channel Impedance Value: 608 Ohm
Lead Channel Impedance Value: 684 Ohm
Lead Channel Impedance Value: 950 Ohm
Lead Channel Pacing Threshold Amplitude: 0.5 V
Lead Channel Pacing Threshold Amplitude: 0.5 V
Lead Channel Pacing Threshold Amplitude: 0.5 V
Lead Channel Pacing Threshold Pulse Width: 0.4 ms
Lead Channel Pacing Threshold Pulse Width: 0.4 ms
Lead Channel Pacing Threshold Pulse Width: 0.4 ms
Lead Channel Sensing Intrinsic Amplitude: 11.875 mV
Lead Channel Sensing Intrinsic Amplitude: 11.875 mV
Lead Channel Sensing Intrinsic Amplitude: 2 mV
Lead Channel Sensing Intrinsic Amplitude: 2 mV
Lead Channel Setting Pacing Amplitude: 1.5 V
Lead Channel Setting Pacing Amplitude: 1.5 V
Lead Channel Setting Pacing Amplitude: 2 V
Lead Channel Setting Pacing Pulse Width: 0.4 ms
Lead Channel Setting Pacing Pulse Width: 0.4 ms
Lead Channel Setting Sensing Sensitivity: 0.9 mV

## 2019-07-27 NOTE — Telephone Encounter (Signed)
Last Visit: 04/28/19 Next Visit: 10/25/19 Labs: 6/10/20Absolute eosinophils are elevated but stable. Rest of CBC WNL. CMP WNL PLQ Eye Exam: 02/18/18 WNL   Okay to refill 30 day supply per Dr. Estanislado Pandy

## 2019-08-12 ENCOUNTER — Other Ambulatory Visit: Payer: Self-pay | Admitting: Internal Medicine

## 2019-08-12 ENCOUNTER — Encounter: Payer: Self-pay | Admitting: Cardiology

## 2019-08-12 NOTE — Progress Notes (Signed)
Remote pacemaker transmission.   

## 2019-08-16 ENCOUNTER — Other Ambulatory Visit: Payer: Self-pay | Admitting: Rheumatology

## 2019-08-16 NOTE — Telephone Encounter (Addendum)
Last Visit: 04/28/19 Next Visit: 10/25/19 Labs: 6/10/20Absolute eosinophils are elevated but stable. Rest of CBC WNL. CMP WNL  Left message to advise patient she is due to update labs.  Okay to refill 30 day supply per Dr. Estanislado Pandy

## 2019-08-17 ENCOUNTER — Ambulatory Visit (INDEPENDENT_AMBULATORY_CARE_PROVIDER_SITE_OTHER): Payer: Medicare Other | Admitting: Internal Medicine

## 2019-08-17 ENCOUNTER — Encounter: Payer: Self-pay | Admitting: Internal Medicine

## 2019-08-17 ENCOUNTER — Other Ambulatory Visit: Payer: Self-pay

## 2019-08-17 VITALS — BP 110/60 | HR 79 | Ht 62.0 in | Wt 142.0 lb

## 2019-08-17 DIAGNOSIS — Z950 Presence of cardiac pacemaker: Secondary | ICD-10-CM | POA: Diagnosis not present

## 2019-08-17 DIAGNOSIS — I255 Ischemic cardiomyopathy: Secondary | ICD-10-CM | POA: Diagnosis not present

## 2019-08-17 DIAGNOSIS — I442 Atrioventricular block, complete: Secondary | ICD-10-CM | POA: Diagnosis not present

## 2019-08-17 DIAGNOSIS — I10 Essential (primary) hypertension: Secondary | ICD-10-CM | POA: Diagnosis not present

## 2019-08-17 MED ORDER — NITROGLYCERIN 0.4 MG SL SUBL: 0.4000 mg | SUBLINGUAL_TABLET | SUBLINGUAL | 2 refills | Status: DC | PRN

## 2019-08-17 NOTE — Progress Notes (Signed)
HPI [name] returns today for followup. She is a pleasant 73 yo woman with a h/o HTN, and CHB. She is s/p PPM insertion. She denies anginal symptoms. No syncope.  Allergies  Allergen Reactions  . Betadine [Povidone Iodine] Shortness Of Breath  . Hydrocodone Itching     Current Outpatient Medications  Medication Sig Dispense Refill  . albuterol (PROVENTIL HFA;VENTOLIN HFA) 108 (90 Base) MCG/ACT inhaler Inhale into the lungs every 6 (six) hours as needed for wheezing or shortness of breath.    Marland Kitchen aspirin 81 MG tablet Take 81 mg by mouth daily.      Marland Kitchen atorvastatin (LIPITOR) 40 MG tablet TAKE 1 TABLET BY MOUTH ONCE DAILY AT 6PM 90 tablet 0  . budesonide-formoterol (SYMBICORT) 160-4.5 MCG/ACT inhaler Inhale 2 puffs into the lungs 2 (two) times daily.    . calcium carbonate (OS-CAL) 600 MG TABS Take 600 mg by mouth 2 (two) times daily with a meal.      . citalopram (CELEXA) 20 MG tablet Take 20 mg by mouth daily.  3  . clonazePAM (KLONOPIN) 0.5 MG tablet Take 0.5 mg by mouth every 12 (twelve) hours as needed for anxiety.  0  . dicyclomine (BENTYL) 20 MG tablet TK 1 T PO TID B MEALS PRN    . Ergocalciferol (VITAMIN D2) 2000 units TABS Take 2,000 Units by mouth daily.    . folic acid (FOLVITE) 1 MG tablet TAKE 1 TABLET BY MOUTH ONCE DAILY 90 tablet 4  . hydroxychloroquine (PLAQUENIL) 200 MG tablet TAKE 1 TABLET BY MOUTH TWICE DAILY MONDAY THROUGH FRIDAY ONLY 40 tablet 0  . losartan (COZAAR) 25 MG tablet TAKE 1 TABLET BY MOUTH ONCE DAILY 90 tablet 2  . methotrexate (RHEUMATREX) 2.5 MG tablet TAKE 5 TABLETS BY MOUTH ONCE WEEKLY 20 tablet 0  . nitroGLYCERIN (NITROSTAT) 0.4 MG SL tablet Place 1 tablet (0.4 mg total) under the tongue every 5 (five) minutes as needed for chest pain. 25 tablet 2  . pantoprazole (PROTONIX) 40 MG tablet TAKE 1 TABLET(40 MG) BY MOUTH DAILY 90 tablet 3   No current facility-administered medications for this visit.      Past Medical History:  Diagnosis Date   . Anxiety   . Asthma   . Breast cancer ([place])    left breast  . Carpal tunnel syndrome   . Depression   . Habitual alcohol use   . Heart attack ([place])   . Hypertension   . Personal history of chemotherapy   . Personal history of radiation therapy   . RA (rheumatoid arthritis) ([place])   . Spinal stenosis   . Vision abnormalities     ROS:   All systems reviewed and negative except as noted in the HPI.   Past Surgical History:  Procedure Laterality Date  . BREAST BIOPSY    . BREAST LUMPECTOMY  2005   left  . CARDIAC CATHETERIZATION N/A 07/04/2016   Procedure: Left Heart Cath and Coronary Angiography;  Surgeon: Belva Crome, MD;  Location: [facility];  Service: Cardiovascular;  Laterality: N/A;  . CARDIAC CATHETERIZATION N/A 07/04/2016   Procedure: Temporary Pacemaker;  Surgeon: Belva Crome, MD;  Location: [facility];  Service: Cardiovascular;  Laterality: N/A;  . CARPAL TUNNEL RELEASE    . CATARACT EXTRACTION Bilateral   . EP IMPLANTABLE DEVICE N/A 07/05/2016   Procedure: Pacemaker Implant;  Surgeon: Evans Lance, MD;  Location: [place];  Service: Cardiovascular;  Laterality: N/A;  . [place]  . LYMPHECTOMY  2005     Family History  Problem Relation Age of Onset  . Stroke Mother   . Cancer Father        colon?  . Pyelonephritis Sister   . Cancer Paternal Aunt   . Cancer Paternal Uncle      Social History   Socioeconomic History  . Marital status: Widowed    Spouse name: Not on file  . Number of children: Not on file  . Years of education: Not on file  . Highest education level: Not on file  Occupational History  . Not on file  Social Needs  . Financial resource strain: Not on file  . Food insecurity    Worry: Not on file    Inability: Not on file  . Transportation needs    Medical: Not on file    Non-medical: Not on file  Tobacco Use  . Smoking status: Former Smoker    Packs/day: 1.50    Years: 30.00    Pack  years: 45.00    Types: Cigarettes    Quit date: 1995    Years since quitting: 25.7  . Smokeless tobacco: Never Used  Substance and Sexual Activity  . Alcohol use: Yes    Alcohol/week: 14.0 standard drinks    Types: 14 Cans of beer per week    Comment: 2 DAILY  . Drug use: Never  . Sexual activity: Not on file  Lifestyle  . Physical activity    Days per week: Not on file    Minutes per session: Not on file  . Stress: Not on file  Relationships  . Social Herbalist on phone: Not on file    Gets together: Not on file    Attends religious service: Not on file    Active member of club or organization: Not on file    Attends meetings of clubs or organizations: Not on file    Relationship status: Not on file  . Intimate partner violence    Fear of current or ex partner: Not on file    Emotionally abused: Not on file    Physically abused: Not on file    Forced sexual activity: Not on file  Other Topics Concern  . Not on file  Social History Narrative  . Not on file     BP 110/60   Pulse 79   Ht 5\' 2"  (1.575 m)   Wt 142 lb (64.4 kg)   SpO2 95%   BMI 25.97 kg/m   Physical Exam:  Well appearing NAD HEENT: Unremarkable Neck:  No JVD, no thyromegally Lymphatics:  No adenopathy Back:  No CVA tenderness Lungs:  Clear with no wheezes HEART:  Regular rate rhythm, no murmurs, no rubs, no clicks Abd:  soft, positive bowel sounds, no organomegally, no rebound, no guarding Ext:  2 plus pulses, no edema, no cyanosis, no clubbing Skin:  No rashes no nodules Neuro:  CN II through XII intact, motor grossly intact  EKG - NSR with P synchronous pacing  DEVICE  Normal device function.  See PaceArt for details.   Assess/Plan: 1. CHB - she is s/p PPM insertion. 2. HTN - Her bp is well controlled.  3. Dyslipidemia - she will continue her statin therapy.   [name].D.

## 2019-08-17 NOTE — Patient Instructions (Signed)

## 2019-08-25 ENCOUNTER — Other Ambulatory Visit: Payer: Self-pay | Admitting: *Deleted

## 2019-08-25 DIAGNOSIS — M0579 Rheumatoid arthritis with rheumatoid factor of multiple sites without organ or systems involvement: Secondary | ICD-10-CM

## 2019-08-25 MED ORDER — HYDROXYCHLOROQUINE SULFATE 200 MG PO TABS: ORAL_TABLET | ORAL | 0 refills | Status: DC

## 2019-08-25 NOTE — Telephone Encounter (Signed)
Refill request received via fax  Last Visit: 04/28/19 Next Visit: 10/25/19 Labs: 6/10/20Absolute eosinophils are elevated but stable. Rest of CBC WNL. CMP WNL PLQ Eye Exam: 08/09/19  Okay to refill per Dr. Estanislado Pandy

## 2019-09-15 ENCOUNTER — Other Ambulatory Visit: Payer: Self-pay | Admitting: *Deleted

## 2019-09-15 MED ORDER — METHOTREXATE 2.5 MG PO TABS: ORAL_TABLET | ORAL | 0 refills | Status: DC

## 2019-09-15 NOTE — Telephone Encounter (Signed)
Refill request received via fax  Last Visit: 04/28/19 Next Visit: 10/25/19 Labs: 6/10/20Absolute eosinophils are elevated but stable. Rest of CBC WNL. CMP WNL  Okay to refill 30 day supply per Dr. Estanislado Pandy

## 2019-09-15 NOTE — Progress Notes (Signed)
Office Visit Note  Patient: [name]             Date of Birth: [birth year]           MRN: [id number]             PCP: Maurice Small, MD Referring: No ref. provider found Visit Date: 09/29/2019 Occupation: @GUAROCC @  Subjective:  Right knee joint pain   History of Present Illness: [name] is a 73 y.o. female with history of seropositive rheumatoid arthritis, osteoarthritis, and DDD.  Patient is taking methotrexate 5 tablets by mouth once weekly, folic acid 1 mg by mouth daily, and Plaquenil 200 1 tablet by mouth twice daily Monday through Friday.  She denies any recent rheumatoid arthritis flares.  She states she fell about 2 weeks ago and had a bruise on right knee which has resolved.   She states for the past 2 days she has been having increased pain in the right knee joint.  She has noticed some swelling in the right knee for the past 1 day.  She states she has intermittent discomfort in both shoulder joints, right great toe, and pain in both hands but denies any joint swelling. She has chronic neck stiffness and performs stretching exercises.   Activities of Daily Living:  Patient reports morning stiffness for 0 minutes.   Patient Denies nocturnal pain.  Difficulty dressing/grooming: Denies Difficulty climbing stairs: Denies Difficulty getting out of chair: Denies Difficulty using hands for taps, buttons, cutlery, and/or writing: Reports  Review of Systems  Constitutional: Negative for fatigue.  HENT: Negative for mouth sores, mouth dryness and nose dryness.   Eyes: Negative for pain, itching, visual disturbance and dryness.  Respiratory: Negative for cough, hemoptysis, shortness of breath, wheezing and difficulty breathing.   Cardiovascular: Negative for chest pain, palpitations, hypertension and swelling in legs/feet.  Gastrointestinal: Negative for blood in stool, constipation and diarrhea.  Endocrine: Negative for increased urination.  Genitourinary: Negative  for difficulty urinating and painful urination.  Musculoskeletal: Positive for arthralgias, joint pain and joint swelling. Negative for myalgias, muscle weakness, morning stiffness, muscle tenderness and myalgias.  Skin: Negative for color change, pallor, rash, hair loss, nodules/bumps, skin tightness, ulcers and sensitivity to sunlight.  Allergic/Immunologic: Negative for susceptible to infections.  Neurological: Negative for dizziness, light-headedness, numbness, headaches and memory loss.  Hematological: Negative for swollen glands.  Psychiatric/Behavioral: Negative for depressed mood, confusion and sleep disturbance. The patient is not nervous/anxious.     PMFS History:  Patient Active Problem List   Diagnosis Date Noted   Bilateral carpal tunnel syndrome 04/14/2018   Right arm pain 01/07/2018   Cervical spinal stenosis 01/07/2018   Cervical radiculopathy 01/07/2018   Stenosis of left subclavian artery ([place]) 12/15/2017   History of asthma 06/25/2017   History of gastroesophageal reflux (GERD) 06/25/2017   History of anxiety 06/25/2017   History of bilateral carpal tunnel release 06/25/2017   Former smoker 01/25/2017   Rheumatoid arthritis involving multiple sites with positive rheumatoid factor ([place]) 01/17/2017   High risk medication use 01/17/2017   Primary osteoarthritis of both hands 01/17/2017   Primary osteoarthritis of both feet 01/17/2017   DJD (degenerative joint disease), cervical 01/17/2017   Spondylosis of lumbar region without myelopathy or radiculopathy 01/17/2017   Syncope and collapse 07/07/2016   Cardiomyopathy, ischemic 07/07/2016   CAD- S/P LAD thrombectomy 07/04/16 07/07/2016   Pacemaker-MDT BiV placed 07/05/16 07/07/2016   History of rheumatoid arthritis 07/07/2016   Complete heart block ([place])  07/04/2016   Essential hypertension 07/04/2016   LBBB (left bundle branch block) 07/04/2016   Ventricular tachycardia ([place]) 07/04/2016    Faintness 07/04/2016   NSTEMI (non-ST elevated myocardial infarction) ([place]) 07/04/2016   Exostosis 06/18/2011   History of breast cancer 02/16/2010   Vitamin D deficiency 02/15/2010   HYPERCHOLESTEROLEMIA 02/15/2010   ANXIETY 02/15/2010   Asthma 02/15/2010   MIGRAINES, HX OF 02/15/2010   MASTECTOMY, LEFT, HX OF 02/15/2010    Past Medical History:  Diagnosis Date   Anxiety    Asthma    Breast cancer ([place])    left breast   Carpal tunnel syndrome    Depression    Habitual alcohol use    Heart attack ([place])    Hypertension    Personal history of chemotherapy    Personal history of radiation therapy    RA (rheumatoid arthritis) ([place])    Spinal stenosis    Vision abnormalities     Family History  Problem Relation Age of Onset   Stroke Mother    Cancer Father        colon?   Pyelonephritis Sister    Cancer Paternal Aunt    Cancer Paternal Uncle    Past Surgical History:  Procedure Laterality Date   BREAST BIOPSY     BREAST LUMPECTOMY  2005   left   CARDIAC CATHETERIZATION N/A 07/04/2016   Procedure: Left Heart Cath and Coronary Angiography;  Surgeon: Belva Crome, MD;  Location: [facility];  Service: Cardiovascular;  Laterality: N/A;   CARDIAC CATHETERIZATION N/A 07/04/2016   Procedure: Temporary Pacemaker;  Surgeon: Belva Crome, MD;  Location: [facility];  Service: Cardiovascular;  Laterality: N/A;   CARPAL TUNNEL RELEASE     CATARACT EXTRACTION Bilateral    EP IMPLANTABLE DEVICE N/A 07/05/2016   Procedure: Pacemaker Implant;  Surgeon: Evans Lance, MD;  Location: [facility];  Service: Cardiovascular;  Laterality: N/A;   LAMINECTOMY  1966   LYMPHECTOMY  2005   Social History   Social History Narrative   Not on file   Immunization History  Administered Date(s) Administered   Influenza Whole 08/21/2009, 08/29/2010   Td 01/26/2018     Objective: Vital Signs: BP 138/77 (BP Location: Right Arm,  Patient Position: Sitting, Cuff Size: Normal)    Pulse 76    Resp 13    Ht 5\' 2"  (1.575 m)    Wt 141 lb 3.2 oz (64 kg)    BMI 25.83 kg/m    Physical Exam Vitals signs and nursing note reviewed.  Constitutional:      Appearance: She is well-developed.  HENT:     Head: Normocephalic and atraumatic.  Eyes:     Conjunctiva/sclera: Conjunctivae normal.  Neck:     Musculoskeletal: Normal range of motion.  Cardiovascular:     Rate and Rhythm: Normal rate and regular rhythm.     Heart sounds: Normal heart sounds.  Pulmonary:     Effort: Pulmonary effort is normal.     Breath sounds: Normal breath sounds.  Abdominal:     General: Bowel sounds are normal.     Palpations: Abdomen is soft.  Lymphadenopathy:     Cervical: No cervical adenopathy.  Skin:    General: Skin is warm and dry.     Capillary Refill: Capillary refill takes less than 2 seconds.  Neurological:     Mental Status: She is alert and oriented to person, place, and time.  Psychiatric:  Behavior: Behavior normal.      Musculoskeletal Exam: C-spine limited ROM.  Thoracic and lumbar spine god ROM.  No midline spinal tenderness.  No SI joint tenderness.  Shoulder joints good ROM.  Right elbow joint contracture.  Left elbow good ROM with no discomfort.  Good ROM with no discomfort in both wrist joints. Right 1st, 2nd, and 3rd MCP joint synovial thickening.  Left 2nd and 3rd MCP joint synovial thickening. PIP and DIP synovial thickening consistent with osteoarthritis of both hands. Hip joints good ROM.  Knee joints good ROM with no discomfort.  No warmth or effusion of knee joints. Ankle joints good ROM with no tenderness or inflammation. Right 1st MTP joint synovial thickening.   CDAI Exam: CDAI Score: 0.8  Patient Global: 4 mm; Provider Global: 4 mm Swollen: 0 ; Tender: 0  Joint Exam   No joint exam has been documented for this visit   There is currently no information documented on the homunculus. Go to the  Rheumatology activity and complete the homunculus joint exam.  Investigation: No additional findings.  Imaging: Xr Knee 3 View Right  Result Date: 09/29/2019 Moderate medial compartment narrowing, medial and intercondylar osteophytes were noted.  Possible chondrocalcinosis was noted.  Severe patellofemoral narrowing was noted. Impression: These findings are consistent with moderate osteoarthritis and severe chondromalacia patella.   Recent Labs: Lab Results  Component Value Date   WBC 7.1 04/28/2019   HGB 13.7 04/28/2019   PLT 256 04/28/2019   NA 141 04/28/2019   K 4.1 04/28/2019   CL 103 04/28/2019   CO2 29 04/28/2019   GLUCOSE 93 04/28/2019   BUN 15 04/28/2019   CREATININE 0.62 04/28/2019   BILITOT 0.4 04/28/2019   ALKPHOS 66 12/13/2017   AST 32 04/28/2019   ALT 26 04/28/2019   PROT 6.0 (L) 04/28/2019   ALBUMIN 3.5 12/13/2017   CALCIUM 8.9 04/28/2019   GFRAA 102 04/28/2019    Speciality Comments: PLQ Eye Exam: 08/09/19 WNL @ [facility] Follow up in 1 year  Procedures:  No procedures performed Allergies: Betadine [povidone iodine] and Hydrocodone   Assessment / Plan:     Visit Diagnoses: Rheumatoid arthritis involving multiple sites with positive rheumatoid factor ([place]) - +RF, +anti-CCP, +ANA: She has no synovitis on exam.  She has not had any recent rheumatoid arthritis flares.  She is clinically doing well on methotrexate 5 tablets by mouth once weekly, folic acid 1 mg by mouth daily, Plaquenil 200 mg 1 tablet twice daily Monday through Friday only.  She has occasional arthralgias but no joint swelling.  She presents today with right knee joint pain.  She fell 2 weeks ago and landed on her knees.  No ecchymosis or erythema was noted.  No joint effusion was noted.  She will continue on the current treatment regimen.  She does not need any refills at this time.  She was advised to notify us if she develops increased joint pain or joint swelling.  She will follow-up in the office in  5 months.  High risk medication use - Methotrexate 5 tablets every 7 days, folic acid 1 mg daily, and Plaquenil 200 mg twice daily Monday through Friday only. Eye Exam: 08/09/19.  CBC and CMP will be drawn today to monitor for drug toxicity.  She will return for lab work in February and every 3 months.- Plan: CBC, CMP  Primary osteoarthritis of both hands: She has PIP and DIP synovial thickening consistent with osteoarthritis of both hands.  She  has no synovitis or tenderness on exam.  Joint protection and muscle strengthening were discussed.   Primary osteoarthritis of both feet: She has osteoarthritic changes in both feet. She has tenderness and synovial thickening of the right 1st MTP joint.  She has no inflammation at this time.  She wears proper fitting shoes.   History of bilateral carpal tunnel release:  She is asymptomatic at this time.   Chronic pain of right knee - She presents today with right knee joint pain that started 2 days ago.  She reports that about 2 weeks ago she fell and landed on both knee joints and noticed bruising on the right knee which has since resolved.  She is having increased discomfort for the past 2 days.  She has noticed some swelling and is concerned.  X-rays of the right knee were obtained today.  She has moderate osteoarthritis and chondrocalcinosis evident on x-ray.  No warmth or effusion was noted on exam.  Plan: XR KNEE 3 VIEW RIGHT  DDD (degenerative disc disease), cervical: She has limited ROM with discomfort.  No symptoms of radiculopathy at this time.   DDD (degenerative disc disease), lumbar:  She has good ROM with no discomfort.  No symptoms of radiculopathy at this time.   Other medical conditions are listed as follows:   History of vitamin D deficiency  History of ventricular tachycardia  History of breast cancer  History of gastroesophageal reflux (GERD)  History of hypertension  History of anxiety  History of hyperlipidemia  History of  left bundle branch block  History of asthma  Former smoker   Orders: Orders Placed This Encounter  Procedures   XR KNEE 3 VIEW RIGHT   CBC   CMP   No orders of the defined types were placed in this encounter.   Face-to-face time spent with patient was 30 minutes. Greater than 50% of time was spent in counseling and coordination of care.  Follow-Up Instructions: Return in 5 months (on 02/27/2020) for Rheumatoid arthritis, Osteoarthritis, DDD.   Ofilia Neas, PA-C   I examined and evaluated the patient with Hazel Sams PA.  Patient had no synovitis on examination today.  She has been complaining of right knee joint discomfort from the recent fall.  The x-ray was unremarkable.  She had some puffiness over the medial aspect of her tibia which probably is related to the trauma.  The plan of care was discussed as noted above.  Bo Merino, MD  Note - This record has been created using Editor, commissioning.  Chart creation errors have been sought, but may not always  have been located. Such creation errors do not reflect on  the standard of medical care.

## 2019-09-20 ENCOUNTER — Other Ambulatory Visit: Payer: Self-pay | Admitting: Physician Assistant

## 2019-09-29 ENCOUNTER — Encounter: Payer: Self-pay | Admitting: Rheumatology

## 2019-09-29 ENCOUNTER — Ambulatory Visit (INDEPENDENT_AMBULATORY_CARE_PROVIDER_SITE_OTHER): Payer: Medicare Other | Admitting: Rheumatology

## 2019-09-29 ENCOUNTER — Other Ambulatory Visit: Payer: Self-pay

## 2019-09-29 ENCOUNTER — Ambulatory Visit: Payer: Self-pay

## 2019-09-29 VITALS — BP 138/77 | HR 76 | Resp 13 | Ht 62.0 in | Wt 141.2 lb

## 2019-09-29 DIAGNOSIS — Z853 Personal history of malignant neoplasm of breast: Secondary | ICD-10-CM

## 2019-09-29 DIAGNOSIS — M5136 Other intervertebral disc degeneration, lumbar region: Secondary | ICD-10-CM

## 2019-09-29 DIAGNOSIS — M19071 Primary osteoarthritis, right ankle and foot: Secondary | ICD-10-CM

## 2019-09-29 DIAGNOSIS — Z8659 Personal history of other mental and behavioral disorders: Secondary | ICD-10-CM

## 2019-09-29 DIAGNOSIS — Z79899 Other long term (current) drug therapy: Secondary | ICD-10-CM | POA: Diagnosis not present

## 2019-09-29 DIAGNOSIS — Z8679 Personal history of other diseases of the circulatory system: Secondary | ICD-10-CM

## 2019-09-29 DIAGNOSIS — M5030 Other cervical disc degeneration, unspecified cervical region: Secondary | ICD-10-CM

## 2019-09-29 DIAGNOSIS — M0579 Rheumatoid arthritis with rheumatoid factor of multiple sites without organ or systems involvement: Secondary | ICD-10-CM | POA: Diagnosis not present

## 2019-09-29 DIAGNOSIS — M19042 Primary osteoarthritis, left hand: Secondary | ICD-10-CM

## 2019-09-29 DIAGNOSIS — M19072 Primary osteoarthritis, left ankle and foot: Secondary | ICD-10-CM

## 2019-09-29 DIAGNOSIS — M19041 Primary osteoarthritis, right hand: Secondary | ICD-10-CM

## 2019-09-29 DIAGNOSIS — Z8639 Personal history of other endocrine, nutritional and metabolic disease: Secondary | ICD-10-CM

## 2019-09-29 DIAGNOSIS — Z8719 Personal history of other diseases of the digestive system: Secondary | ICD-10-CM

## 2019-09-29 DIAGNOSIS — M25561 Pain in right knee: Secondary | ICD-10-CM

## 2019-09-29 DIAGNOSIS — Z87891 Personal history of nicotine dependence: Secondary | ICD-10-CM

## 2019-09-29 DIAGNOSIS — Z8709 Personal history of other diseases of the respiratory system: Secondary | ICD-10-CM

## 2019-09-29 DIAGNOSIS — Z9889 Other specified postprocedural states: Secondary | ICD-10-CM

## 2019-09-29 DIAGNOSIS — G8929 Other chronic pain: Secondary | ICD-10-CM

## 2019-09-29 NOTE — Patient Instructions (Signed)
Standing Labs We placed an order today for your standing lab work.    Please come back and get your standing labs in February and every 3 months  We have open lab daily Monday through Thursday from 8:30-12:30 PM and 1:30-4:30 PM and Friday from 8:30-12:30 PM and 1:30-4:00 PM at the office of Dr. Shaili Deveshwar.   You may experience shorter wait times on Monday and Friday afternoons. The office is located at [address], [address], [place], [place] [address] No appointment is necessary.   Labs are drawn by [facility].  You may receive a bill from [facility] for your lab work.  If you wish to have your labs drawn at another location, please call the office 24 hours in advance to send orders.  If you have any questions regarding directions or hours of operation,  please call [phone].   Just as a reminder please drink plenty of water prior to coming for your lab work. Thanks!  

## 2019-09-30 LAB — COMPLETE METABOLIC PANEL WITH GFR
AG Ratio: 1.8 (calc) (ref 1.0–2.5)
ALT: 22 U/L (ref 6–29)
AST: 34 U/L (ref 10–35)
Albumin: 3.9 g/dL (ref 3.6–5.1)
Alkaline phosphatase (APISO): 71 U/L (ref 37–153)
BUN: 13 mg/dL (ref 7–25)
CO2: 32 mmol/L (ref 20–32)
Calcium: 9.4 mg/dL (ref 8.6–10.4)
Chloride: 102 mmol/L (ref 98–110)
Creat: 0.66 mg/dL (ref 0.60–0.93)
GFR, Est African American: 100 mL/min/{1.73_m2} (ref >=60)
GFR, Est Non African American: 86 mL/min/{1.73_m2} (ref >=60)
Globulin: 2.2 g/dL (calc) (ref 1.9–3.7)
Glucose, Bld: 97 mg/dL (ref 65–99)
Potassium: 4.1 mmol/L (ref 3.5–5.3)
Sodium: 139 mmol/L (ref 135–146)
Total Bilirubin: 0.5 mg/dL (ref 0.2–1.2)
Total Protein: 6.1 g/dL (ref 6.1–8.1)

## 2019-09-30 LAB — CBC WITH DIFFERENTIAL/PLATELET
Absolute Monocytes: 707 cells/uL (ref 200–950)
Basophils Absolute: 81 cells/uL (ref 0–200)
Basophils Relative: 1.3 %
Eosinophils Absolute: 663 cells/uL — ABNORMAL HIGH (ref 15–500)
Eosinophils Relative: 10.7 %
HCT: 42.5 % (ref 35.0–45.0)
Hemoglobin: 14.3 g/dL (ref 11.7–15.5)
Lymphs Abs: 1383 cells/uL (ref 850–3900)
MCH: 32.4 pg (ref 27.0–33.0)
MCHC: 33.6 g/dL (ref 32.0–36.0)
MCV: 96.4 fL (ref 80.0–100.0)
MPV: 9.7 fL (ref 7.5–12.5)
Monocytes Relative: 11.4 %
Neutro Abs: 3367 cells/uL (ref 1500–7800)
Neutrophils Relative %: 54.3 %
Platelets: 258 10*3/uL (ref 140–400)
RBC: 4.41 10*6/uL (ref 3.80–5.10)
RDW: 13.1 % (ref 11.0–15.0)
Total Lymphocyte: 22.3 %
WBC: 6.2 10*3/uL (ref 3.8–10.8)

## 2019-09-30 NOTE — Progress Notes (Signed)
CBC stable. CMP WNL.

## 2019-10-04 ENCOUNTER — Other Ambulatory Visit: Payer: Self-pay | Admitting: Rheumatology

## 2019-10-05 NOTE — Telephone Encounter (Signed)
Last Visit: 09/29/19 Next Visit: 03/01/20  Okay to refill per Dr. Estanislado Pandy

## 2019-10-11 ENCOUNTER — Other Ambulatory Visit: Payer: Self-pay | Admitting: Rheumatology

## 2019-10-11 NOTE — Telephone Encounter (Signed)
Last Visit: 09/29/2019 Next Visit: 03/01/2020 Labs: 09/29/2019 CBC stable. CMP WNL  Okay to refill per Dr. Estanislado Pandy.

## 2019-10-26 ENCOUNTER — Ambulatory Visit (INDEPENDENT_AMBULATORY_CARE_PROVIDER_SITE_OTHER): Payer: Medicare Other | Admitting: *Deleted

## 2019-10-26 DIAGNOSIS — I442 Atrioventricular block, complete: Secondary | ICD-10-CM

## 2019-10-28 LAB — CUP PACEART REMOTE DEVICE CHECK
Battery Remaining Longevity: 71 mo
Battery Voltage: 3.01 V
Brady Statistic AP VP Percent: 0.12 %
Brady Statistic AP VS Percent: 0.03 %
Brady Statistic AS VP Percent: 99.84 %
Brady Statistic AS VS Percent: 0.01 %
Brady Statistic RA Percent Paced: 0.14 %
Brady Statistic RV Percent Paced: 99.95 %
Date Time Interrogation Session: 20201210040104
Implantable Lead Implant Date: 20170818
Implantable Lead Implant Date: 20170818
Implantable Lead Implant Date: 20170818
Implantable Lead Location: 753858
Implantable Lead Location: 753859
Implantable Lead Location: 753860
Implantable Lead Model: 4396
Implantable Lead Model: 5076
Implantable Lead Model: 5076
Implantable Pulse Generator Implant Date: 20170818
Lead Channel Impedance Value: 342 Ohm
Lead Channel Impedance Value: 456 Ohm
Lead Channel Impedance Value: 494 Ohm
Lead Channel Impedance Value: 494 Ohm
Lead Channel Impedance Value: 532 Ohm
Lead Channel Impedance Value: 589 Ohm
Lead Channel Impedance Value: 627 Ohm
Lead Channel Impedance Value: 665 Ohm
Lead Channel Impedance Value: 931 Ohm
Lead Channel Pacing Threshold Amplitude: 0.5 V
Lead Channel Pacing Threshold Amplitude: 0.5 V
Lead Channel Pacing Threshold Amplitude: 0.625 V
Lead Channel Pacing Threshold Pulse Width: 0.4 ms
Lead Channel Pacing Threshold Pulse Width: 0.4 ms
Lead Channel Pacing Threshold Pulse Width: 0.4 ms
Lead Channel Sensing Intrinsic Amplitude: 3.5 mV
Lead Channel Sensing Intrinsic Amplitude: 3.5 mV
Lead Channel Sensing Intrinsic Amplitude: 8.125 mV
Lead Channel Sensing Intrinsic Amplitude: 8.125 mV
Lead Channel Setting Pacing Amplitude: 1.5 V
Lead Channel Setting Pacing Amplitude: 1.75 V
Lead Channel Setting Pacing Amplitude: 2 V
Lead Channel Setting Pacing Pulse Width: 0.4 ms
Lead Channel Setting Pacing Pulse Width: 0.4 ms
Lead Channel Setting Sensing Sensitivity: 0.9 mV

## 2019-11-16 ENCOUNTER — Other Ambulatory Visit: Payer: Self-pay | Admitting: Internal Medicine

## 2019-11-18 ENCOUNTER — Telehealth: Payer: Self-pay | Admitting: Rheumatology

## 2019-11-18 DIAGNOSIS — M0579 Rheumatoid arthritis with rheumatoid factor of multiple sites without organ or systems involvement: Secondary | ICD-10-CM

## 2019-11-18 MED ORDER — HYDROXYCHLOROQUINE SULFATE 200 MG PO TABS: ORAL_TABLET | ORAL | 0 refills | Status: DC

## 2019-11-18 NOTE — Telephone Encounter (Signed)
Last Visit: 09/29/2019 Next Visit: 03/01/2020 Labs: 09/29/2019 CBC stable. CMP WNL Eye exam: 08/09/2019   A 90 day supply of methotexate was sent to the pharmacy on 10/11/2019. I called pharmacy to confirm entire prescription was dispensed to patient and it was. The pharmacy states the patient needs a refill of plaquenil.   Attempted to contact patient and left message on machine to advise patient of what the pharmacy said and to advise PLQ prescription has been sent.   Okay to refill per Dr. Estanislado Pandy.

## 2019-11-18 NOTE — Telephone Encounter (Signed)
Patient left a voicemail requesting prescription refill of Methotrexate to be sent to [facility] at [address].  Patient states the pharmacy has faxed a refill request, but hasn't received a response.  Patient states she is out of medication.

## 2019-11-23 NOTE — Progress Notes (Signed)
Cardiology Office Note:    Date:  11/25/2019   ID:  [name], DOB 01-12-1946, MRN [id number]  PCP:  Maurice Small, MD  Cardiologist:  Sinclair Grooms, MD   Referring MD: Maurice Small, MD   Chief Complaint  Patient presents with  . Coronary Artery Disease  . Congestive Heart Failure  . Pacemaker Problem    History of Present Illness:    [name] is a 74 y.o. female with a hx of history of breast Ca, HTN, RA, depression and recently diagnosed CAD s/p thrombotic occlusion of the LAD s/p thrombectomy (07/04/16), VT and CHB s/p BiV PPM. LVEF 55% by echo 2018.  She has been concerned with elevated blood pressures on her home device.  She denies chest pain.  She has had some congestion.  She feels there is wheezing associated.  It can occur at rest and does not typically exertion related.  She denies orthopnea.  She walks 30 minutes every day and has not had difficulty or change in endurance.  She denies angina.  Past Medical History:  Diagnosis Date  . Anxiety   . Asthma   . Breast cancer ([place])    left breast  . Carpal tunnel syndrome   . Depression   . Habitual alcohol use   . Heart attack ([place])   . Hypertension   . Personal history of chemotherapy   . Personal history of radiation therapy   . RA (rheumatoid arthritis) [place])   . Spinal stenosis   . Vision abnormalities     Past Surgical History:  Procedure Laterality Date  . BREAST BIOPSY    . BREAST LUMPECTOMY  2005   left  . CARDIAC CATHETERIZATION N/A 07/04/2016   Procedure: Left Heart Cath and Coronary Angiography;  Surgeon: Belva Crome, MD;  Location: [facility];  Service: Cardiovascular;  Laterality: N/A;  . CARDIAC CATHETERIZATION N/A 07/04/2016   Procedure: Temporary Pacemaker;  Surgeon: Belva Crome, MD;  Location: [place] [facility];  Service: Cardiovascular;  Laterality: N/A;  . CARPAL TUNNEL RELEASE    . CATARACT EXTRACTION Bilateral   . EP IMPLANTABLE DEVICE N/A 07/05/2016    Procedure: Pacemaker Implant;  Surgeon: Evans Lance, MD;  Location: [facility];  Service: Cardiovascular;  Laterality: N/A;  . [place]  . LYMPHECTOMY  2005    Current Medications: Current Meds  Medication Sig  . albuterol (PROVENTIL HFA;VENTOLIN HFA) 108 (90 Base) MCG/ACT inhaler Inhale into the lungs every 6 (six) hours as needed for wheezing or shortness of breath.  Marland Kitchen aspirin 81 MG tablet Take 81 mg by mouth daily.    Marland Kitchen atorvastatin (LIPITOR) 40 MG tablet TAKE 1 TABLET BY MOUTH ONCE DAILY AT 6PM  . budesonide-formoterol (SYMBICORT) 160-4.5 MCG/ACT inhaler Inhale 2 puffs into the lungs 2 (two) times daily.  . calcium carbonate (OS-CAL) 600 MG TABS Take 600 mg by mouth 2 (two) times daily with a meal.    . citalopram (CELEXA) 20 MG tablet Take 20 mg by mouth daily.  . clonazePAM (KLONOPIN) 0.5 MG tablet Take 0.5 mg by mouth every 12 (twelve) hours as needed for anxiety.  . dicyclomine (BENTYL) 20 MG tablet TK 1 T PO TID B MEALS PRN  . Ergocalciferol (VITAMIN D2) 2000 units TABS Take 2,000 Units by mouth daily.  . folic acid (FOLVITE) 1 MG tablet Take 1 tablet by mouth once daily  . hydroxychloroquine (PLAQUENIL) 200 MG tablet Take 1 tablet  by mouth Twice daily Monday through Friday only  . methotrexate 2.5 MG tablet TAKE 5 TABLETS BY MOUTH 1 TIME WEEKLY  . nitroGLYCERIN (NITROSTAT) 0.4 MG SL tablet Place 1 tablet (0.4 mg total) under the tongue every 5 (five) minutes as needed for chest pain.  . pantoprazole (PROTONIX) 40 MG tablet TAKE 1 TABLET(40 MG) BY MOUTH DAILY  . [DISCONTINUED] losartan (COZAAR) 25 MG tablet Take 1 tablet by mouth once daily     Allergies:   Betadine [povidone iodine] and Hydrocodone   Social History   Socioeconomic History  . Marital status: Widowed    Spouse name: Not on file  . Number of children: Not on file  . Years of education: Not on file  . Highest education level: Not on file  Occupational History  . Not on file  Tobacco Use   . Smoking status: Former Smoker    Packs/day: 1.50    Years: 30.00    Pack years: 45.00    Types: Cigarettes    Quit date: 1995    Years since quitting: 26.0  . Smokeless tobacco: Never Used  Substance and Sexual Activity  . Alcohol use: Yes    Alcohol/week: 14.0 standard drinks    Types: 14 Cans of beer per week    Comment: 2 DAILY  . Drug use: Never  . Sexual activity: Not on file  Other Topics Concern  . Not on file  Social History Narrative  . Not on file   Social Determinants of Health   Financial Resource Strain:   . Difficulty of Paying Living Expenses: Not on file  Food Insecurity:   . Worried About Charity fundraiser in the Last Year: Not on file  . Ran Out of Food in the Last Year: Not on file  Transportation Needs:   . Lack of Transportation (Medical): Not on file  . Lack of Transportation (Non-Medical): Not on file  Physical Activity:   . Days of Exercise per Week: Not on file  . Minutes of Exercise per Session: Not on file  Stress:   . Feeling of Stress : Not on file  Social Connections:   . Frequency of Communication with Friends and Family: Not on file  . Frequency of Social Gatherings with Friends and Family: Not on file  . Attends Religious Services: Not on file  . Active Member of Clubs or Organizations: Not on file  . Attends Archivist Meetings: Not on file  . Marital Status: Not on file     Family History: The patient's family history includes Cancer in her father, paternal aunt, and paternal uncle; Pyelonephritis in her sister; Stroke in her mother.  ROS:   Please see the history of present illness.    Denies lower extremity edema.  No chills, fever, or other complaints.  Had COVID-19 screening today.  Results are not known.  All other systems reviewed and are negative.  EKGs/Labs/Other Studies Reviewed:    The following studies were reviewed today: No new data  EKG:  EKG performed September 07, 2019,Demonstrates ventricular  pacing with atrial tracking.  Recent Labs: 09/29/2019: ALT 22; BUN 13; Creat 0.66; Hemoglobin 14.3; Platelets 258; Potassium 4.1; Sodium 139  Recent Lipid Panel    Component Value Date/Time   CHOL 166 07/05/2016 0313   TRIG 88 07/05/2016 0313   HDL 54 07/05/2016 0313   CHOLHDL 3.1 07/05/2016 0313   VLDL 18 07/05/2016 0313   LDLCALC 94 07/05/2016 0313  Physical Exam:    VS:  BP 132/62   Pulse 85   Ht 5\' 2"  (1.575 m)   Wt 139 lb 12.8 oz (63.4 kg)   SpO2 97%   BMI 25.57 kg/m     Wt Readings from Last 3 Encounters:  11/25/19 139 lb 12.8 oz (63.4 kg)  09/29/19 141 lb 3.2 oz (64 kg)  08/17/19 142 lb (64.4 kg)     GEN: Healthy-appearing. No acute distress HEENT: Normal NECK: No JVD. LYMPHATICS: No lymphadenopathy CARDIAC:  RRR without murmur, gallop, or edema. VASCULAR:  Normal Pulses. No bruits. RESPIRATORY:  Clear to auscultation without rales, wheezing or rhonchi  ABDOMEN: Soft, non-tender, non-distended, No pulsatile mass, MUSCULOSKELETAL: No deformity  SKIN: Warm and dry NEUROLOGIC:  Alert and oriented x 3 PSYCHIATRIC:  Normal affect   ASSESSMENT:    1. CAD- S/P LAD thrombectomy 07/04/16   2. Systolic congestive heart failure, unspecified HF chronicity (HCC)   3. Cardiomyopathy, ischemic   4. Pacemaker-MDT BiV placed 07/05/16   5. Stenosis of left subclavian artery (HCC)   6. LBBB (left bundle branch block)   7. Essential hypertension   8. Educated about COVID-19 virus infection    PLAN:    In order of problems listed above:  1. Stable without angina 2. No evidence of volume overload.  If dyspnea continues to be an issue, consider repeat echocardiogram 3. Consider repeat echocardiogram 4. Followed in device clinic 5. Not addressed 6. Paced rhythm 7. Increase losartan to 50 mg daily.  Monitor blood pressure 2 to 6 hours after morning dose.  New prescription for 50 mg tablets if she is able to take losartan with improved blood pressure.  Basic metabolic  panel is not required as potassium and creatinine were normal 3 days ago.  Unless blood pressure instability, she will return in 1 year.  Continues to have shortness of breath randomly, consider repeat echocardiogram.   Medication Adjustments/Labs and Tests Ordered: Current medicines are reviewed at length with the patient today.  Concerns regarding medicines are outlined above.  No orders of the defined types were placed in this encounter.  Meds ordered this encounter  Medications  . losartan (COZAAR) 50 MG tablet    Sig: Take 1 tablet (50 mg total) by mouth daily.    Dispense:  90 tablet    Refill:  3    Dose change    Patient Instructions  Medication Instructions:  1) INCREASE Losartan to 50mg  once daily  *If you need a refill on your cardiac medications before your next appointment, please call your pharmacy*  Lab Work: None If you have labs (blood work) drawn today and your tests are completely normal, you will receive your results only by: Marland Kitchen MyChart Message (if you have MyChart) OR . A paper copy in the mail If you have any lab test that is abnormal or we need to change your treatment, we will call you to review the results.  Testing/Procedures: None  Follow-Up: At [facility], you and your health needs are our priority.  As part of our continuing mission to provide you with exceptional heart care, we have created designated Provider Care Teams.  These Care Teams include your primary Cardiologist (physician) and Advanced Practice Providers (APPs -  Physician Assistants and Nurse Practitioners) who all work together to provide you with the care you need, when you need it.  Your next appointment:   12 month(s)  The format for your next appointment:   In Person  Provider:   You may see Sinclair Grooms, MD or one of the following Advanced Practice Providers on your designated Care Team:    Truitt Merle, NP  Cecilie Kicks, NP  Kathyrn Drown, NP   Other  Instructions      Signed, Sinclair Grooms, MD  11/25/2019 4:36 PM    [place]

## 2019-11-25 ENCOUNTER — Encounter: Payer: Self-pay | Admitting: Interventional Cardiology

## 2019-11-25 ENCOUNTER — Other Ambulatory Visit: Payer: Self-pay

## 2019-11-25 ENCOUNTER — Ambulatory Visit: Payer: Medicare Other | Admitting: Interventional Cardiology

## 2019-11-25 VITALS — BP 132/62 | HR 85 | Ht 62.0 in | Wt 139.8 lb

## 2019-11-25 DIAGNOSIS — I5020 Unspecified systolic (congestive) heart failure: Secondary | ICD-10-CM

## 2019-11-25 DIAGNOSIS — Z7189 Other specified counseling: Secondary | ICD-10-CM

## 2019-11-25 DIAGNOSIS — I2510 Atherosclerotic heart disease of native coronary artery without angina pectoris: Secondary | ICD-10-CM

## 2019-11-25 DIAGNOSIS — I10 Essential (primary) hypertension: Secondary | ICD-10-CM

## 2019-11-25 DIAGNOSIS — I255 Ischemic cardiomyopathy: Secondary | ICD-10-CM

## 2019-11-25 DIAGNOSIS — Z9861 Coronary angioplasty status: Secondary | ICD-10-CM

## 2019-11-25 DIAGNOSIS — I447 Left bundle-branch block, unspecified: Secondary | ICD-10-CM

## 2019-11-25 DIAGNOSIS — Z950 Presence of cardiac pacemaker: Secondary | ICD-10-CM

## 2019-11-25 DIAGNOSIS — I771 Stricture of artery: Secondary | ICD-10-CM | POA: Diagnosis not present

## 2019-11-25 MED ORDER — LOSARTAN POTASSIUM 50 MG PO TABS: 50.0000 mg | ORAL_TABLET | Freq: Every day | ORAL | 3 refills | Status: DC

## 2019-11-25 NOTE — Patient Instructions (Signed)
Medication Instructions:  1) INCREASE Losartan to 50mg  once daily  *If you need a refill on your cardiac medications before your next appointment, please call your pharmacy*  Lab Work: None If you have labs (blood work) drawn today and your tests are completely normal, you will receive your results only by: Marland Kitchen MyChart Message (if you have MyChart) OR . A paper copy in the mail If you have any lab test that is abnormal or we need to change your treatment, we will call you to review the results.  Testing/Procedures: None  Follow-Up: At [facility], you and your health needs are our priority.  As part of our continuing mission to provide you with exceptional heart care, we have created designated Provider Care Teams.  These Care Teams include your primary Cardiologist (physician) and Advanced Practice Providers (APPs -  Physician Assistants and Nurse Practitioners) who all work together to provide you with the care you need, when you need it.  Your next appointment:   12 month(s)  The format for your next appointment:   In Person  Provider:   You may see Sinclair Grooms, MD or one of the following Advanced Practice Providers on your designated Care Team:    Truitt Merle, NP  Cecilie Kicks, NP  Kathyrn Drown, NP   Other Instructions

## 2020-01-09 ENCOUNTER — Other Ambulatory Visit: Payer: Self-pay | Admitting: Rheumatology

## 2020-01-10 NOTE — Telephone Encounter (Signed)
Last Visit: 09/29/2019 Next Visit: 03/03/2020 Labs: 09/29/2019 CBC stable. CMP WNL  Left message to advise patient she is due to update labs.  Okay to refill 30 day supply per Dr. Estanislado Pandy

## 2020-01-11 ENCOUNTER — Other Ambulatory Visit: Payer: Self-pay | Admitting: *Deleted

## 2020-01-11 DIAGNOSIS — Z79899 Other long term (current) drug therapy: Secondary | ICD-10-CM

## 2020-01-11 LAB — CBC WITH DIFFERENTIAL/PLATELET
Absolute Monocytes: 805 cells/uL (ref 200–950)
Basophils Absolute: 81 cells/uL (ref 0–200)
Basophils Relative: 1.5 %
Eosinophils Absolute: 400 cells/uL (ref 15–500)
Eosinophils Relative: 7.4 %
HCT: 39.9 % (ref 35.0–45.0)
Hemoglobin: 13.7 g/dL (ref 11.7–15.5)
Lymphs Abs: 1242 cells/uL (ref 850–3900)
MCH: 33.2 pg — ABNORMAL HIGH (ref 27.0–33.0)
MCHC: 34.3 g/dL (ref 32.0–36.0)
MCV: 96.6 fL (ref 80.0–100.0)
MPV: 9.5 fL (ref 7.5–12.5)
Monocytes Relative: 14.9 %
Neutro Abs: 2873 cells/uL (ref 1500–7800)
Neutrophils Relative %: 53.2 %
Platelets: 266 10*3/uL (ref 140–400)
RBC: 4.13 10*6/uL (ref 3.80–5.10)
RDW: 13.1 % (ref 11.0–15.0)
Total Lymphocyte: 23 %
WBC: 5.4 10*3/uL (ref 3.8–10.8)

## 2020-01-11 LAB — COMPLETE METABOLIC PANEL WITH GFR
AG Ratio: 1.4 (calc) (ref 1.0–2.5)
ALT: 21 U/L (ref 6–29)
AST: 34 U/L (ref 10–35)
Albumin: 3.6 g/dL (ref 3.6–5.1)
Alkaline phosphatase (APISO): 67 U/L (ref 37–153)
BUN: 14 mg/dL (ref 7–25)
CO2: 32 mmol/L (ref 20–32)
Calcium: 9.2 mg/dL (ref 8.6–10.4)
Chloride: 100 mmol/L (ref 98–110)
Creat: 0.73 mg/dL (ref 0.60–0.93)
GFR, Est African American: 93 mL/min/{1.73_m2} (ref >=60)
GFR, Est Non African American: 81 mL/min/{1.73_m2} (ref >=60)
Globulin: 2.5 g/dL (calc) (ref 1.9–3.7)
Glucose, Bld: 75 mg/dL (ref 65–99)
Potassium: 4 mmol/L (ref 3.5–5.3)
Sodium: 138 mmol/L (ref 135–146)
Total Bilirubin: 0.5 mg/dL (ref 0.2–1.2)
Total Protein: 6.1 g/dL (ref 6.1–8.1)

## 2020-01-13 ENCOUNTER — Other Ambulatory Visit: Payer: Self-pay

## 2020-01-13 ENCOUNTER — Other Ambulatory Visit: Payer: Self-pay | Admitting: Family Medicine

## 2020-01-13 ENCOUNTER — Ambulatory Visit
Admission: RE | Admit: 2020-01-13 | Discharge: 2020-01-13 | Disposition: A | Discharge status: Home / Self Care | Payer: Medicare Other | Source: Ambulatory Visit | Attending: Family Medicine | Admitting: Family Medicine

## 2020-01-13 DIAGNOSIS — Z1231 Encounter for screening mammogram for malignant neoplasm of breast: Secondary | ICD-10-CM

## 2020-01-14 ENCOUNTER — Ambulatory Visit: Payer: Medicare Other | Attending: Internal Medicine

## 2020-01-14 DIAGNOSIS — Z23 Encounter for immunization: Secondary | ICD-10-CM | POA: Insufficient documentation

## 2020-01-14 NOTE — Progress Notes (Signed)
   Covid-19 Vaccination Clinic  Name:  [name]    MRN: [id number] DOB: May 13, 1946  01/14/2020  [name] was observed post Covid-19 immunization for 15 minutes without incidence. She was provided with Vaccine Information Sheet and instruction to access the V-Safe system.   [name] was instructed to call 911 with any severe reactions post vaccine: Marland Kitchen Difficulty breathing  . Swelling of your face and throat  . A fast heartbeat  . A bad rash all over your body  . Dizziness and weakness    Immunizations Administered    Name Date Dose VIS Date Route   Pfizer COVID-19 Vaccine 01/14/2020 10:53 AM 0.3 mL 10/29/2019 Intramuscular   Manufacturer: [place]   Lot: [id number]   [place]: [id number]

## 2020-01-21 ENCOUNTER — Telehealth: Payer: Self-pay | Admitting: Rheumatology

## 2020-01-21 NOTE — Telephone Encounter (Signed)
Patient left a voicemail requesting a return call regarding the bone density scan that Dr. Estanislado Pandy requested.

## 2020-01-21 NOTE — Telephone Encounter (Signed)
Spoke with patient and she states she had her last bone density scan ordered by her PCP. Patient states she will contact them to see where she last had it performed so we may order it at the same place. She will call back to let us know.

## 2020-01-24 ENCOUNTER — Other Ambulatory Visit: Payer: Self-pay | Admitting: Family Medicine

## 2020-01-24 DIAGNOSIS — E2839 Other primary ovarian failure: Secondary | ICD-10-CM

## 2020-01-25 ENCOUNTER — Ambulatory Visit (INDEPENDENT_AMBULATORY_CARE_PROVIDER_SITE_OTHER): Payer: Medicare Other | Admitting: *Deleted

## 2020-01-25 DIAGNOSIS — I442 Atrioventricular block, complete: Secondary | ICD-10-CM

## 2020-01-25 LAB — CUP PACEART REMOTE DEVICE CHECK
Battery Remaining Longevity: 62 mo
Battery Voltage: 3.01 V
Brady Statistic AP VP Percent: 0.2 %
Brady Statistic AP VS Percent: 0.02 %
Brady Statistic AS VP Percent: 99.74 %
Brady Statistic AS VS Percent: 0.03 %
Brady Statistic RA Percent Paced: 0.23 %
Brady Statistic RV Percent Paced: 99.91 %
Date Time Interrogation Session: 20210309213949
Implantable Lead Implant Date: 20170818
Implantable Lead Implant Date: 20170818
Implantable Lead Implant Date: 20170818
Implantable Lead Location: 753858
Implantable Lead Location: 753859
Implantable Lead Location: 753860
Implantable Lead Model: 4396
Implantable Lead Model: 5076
Implantable Lead Model: 5076
Implantable Pulse Generator Implant Date: 20170818
Lead Channel Impedance Value: 342 Ohm
Lead Channel Impedance Value: 437 Ohm
Lead Channel Impedance Value: 475 Ohm
Lead Channel Impedance Value: 494 Ohm
Lead Channel Impedance Value: 532 Ohm
Lead Channel Impedance Value: 570 Ohm
Lead Channel Impedance Value: 589 Ohm
Lead Channel Impedance Value: 646 Ohm
Lead Channel Impedance Value: 893 Ohm
Lead Channel Pacing Threshold Amplitude: 0.5 V
Lead Channel Pacing Threshold Amplitude: 0.5 V
Lead Channel Pacing Threshold Amplitude: 0.5 V
Lead Channel Pacing Threshold Pulse Width: 0.4 ms
Lead Channel Pacing Threshold Pulse Width: 0.4 ms
Lead Channel Pacing Threshold Pulse Width: 0.4 ms
Lead Channel Sensing Intrinsic Amplitude: 2 mV
Lead Channel Sensing Intrinsic Amplitude: 2 mV
Lead Channel Sensing Intrinsic Amplitude: 7.5 mV
Lead Channel Sensing Intrinsic Amplitude: 7.5 mV
Lead Channel Setting Pacing Amplitude: 1.5 V
Lead Channel Setting Pacing Amplitude: 1.5 V
Lead Channel Setting Pacing Amplitude: 2 V
Lead Channel Setting Pacing Pulse Width: 0.4 ms
Lead Channel Setting Pacing Pulse Width: 0.4 ms
Lead Channel Setting Sensing Sensitivity: 0.9 mV

## 2020-01-26 NOTE — Progress Notes (Signed)
PPM Remote  

## 2020-02-08 ENCOUNTER — Telehealth: Payer: Self-pay | Admitting: *Deleted

## 2020-02-08 ENCOUNTER — Ambulatory Visit: Payer: Medicare Other | Attending: Internal Medicine

## 2020-02-08 DIAGNOSIS — M0579 Rheumatoid arthritis with rheumatoid factor of multiple sites without organ or systems involvement: Secondary | ICD-10-CM

## 2020-02-08 DIAGNOSIS — Z23 Encounter for immunization: Secondary | ICD-10-CM

## 2020-02-08 MED ORDER — HYDROXYCHLOROQUINE SULFATE 200 MG PO TABS: ORAL_TABLET | ORAL | 0 refills | Status: DC

## 2020-02-08 MED ORDER — METHOTREXATE 2.5 MG PO TABS: ORAL_TABLET | ORAL | 0 refills | Status: DC

## 2020-02-08 NOTE — Telephone Encounter (Signed)
Refill requests received via fax  Last Visit:09/29/2019 Next Visit:03/03/2020 Labs: 01/11/20 WNL PLQ Eye Exam: 08/09/19 WNL  Current dose per office note on 09/29/19: Methotrexate 5 tablets every 7 days and Plaquenil 200 mg twice daily Monday through Friday only.   Okay to refill per Dr. Estanislado Pandy

## 2020-02-08 NOTE — Progress Notes (Signed)
   Covid-19 Vaccination Clinic  Name:  [name]    MRN: [id number] DOB: [birth year]  02/08/2020  [name] was observed post Covid-19 immunization for 15 minutes without incident. She was provided with Vaccine Information Sheet and instruction to access the V-Safe system.   [name] was instructed to call 911 with any severe reactions post vaccine: Marland Kitchen Difficulty breathing  . Swelling of face and throat  . A fast heartbeat  . A bad rash all over body  . Dizziness and weakness   Immunizations Administered    Name Date Dose VIS Date Route   Pfizer COVID-19 Vaccine 02/08/2020 12:54 PM 0.3 mL 10/29/2019 Intramuscular   Manufacturer: [place]   Lot: [id number]   [place]: [id number]

## 2020-02-17 ENCOUNTER — Other Ambulatory Visit: Payer: Self-pay | Admitting: Internal Medicine

## 2020-02-21 ENCOUNTER — Ambulatory Visit
Admission: RE | Admit: 2020-02-21 | Discharge: 2020-02-21 | Disposition: A | Discharge status: Home / Self Care | Payer: Medicare Other | Source: Ambulatory Visit | Attending: Family Medicine | Admitting: Family Medicine

## 2020-02-21 ENCOUNTER — Other Ambulatory Visit: Payer: Self-pay

## 2020-02-21 DIAGNOSIS — E2839 Other primary ovarian failure: Secondary | ICD-10-CM

## 2020-02-22 ENCOUNTER — Other Ambulatory Visit: Payer: Self-pay | Admitting: Internal Medicine

## 2020-02-22 NOTE — Progress Notes (Signed)
DXA is normal.  She will need repeat DXA in 5 years.

## 2020-02-23 NOTE — Progress Notes (Signed)
Office Visit Note  Patient: [name]             Date of Birth: 24-Sep-1946           MRN: [id number]             PCP: Maurice Small, MD Referring: Maurice Small, MD Visit Date: 03/02/2020 Occupation: @GUAROCC @  Subjective:  Right ring finger pain   History of Present Illness: [name] Speak is a 74 y.o. female with history of seropositive rheumatoid arthritis and osteoarthritis.  She is taking MTX 5 tablets po once weekly and PLQ 200 mg 1 tablet BID M-F.  She denies any recent rheumatoid arthritis flares.    She is having pain and swelling in the right ring finger.  She has noticed locking of the digit intermittently.  She is also having left shoulder joint pain.  She is having increased discomfort in the left foot due to overcrowding of the toes.   Activities of Daily Living:  Patient reports joint stiffness all day  Patient Denies nocturnal pain.  Difficulty dressing/grooming: Denies Difficulty climbing stairs: Denies Difficulty getting out of chair: Denies Difficulty using hands for taps, buttons, cutlery, and/or writing: Reports  Review of Systems  Constitutional: Negative for fatigue.  HENT: Positive for mouth dryness. Negative for mouth sores and nose dryness.   Eyes: Positive for dryness. Negative for pain and visual disturbance.  Respiratory: Negative for cough, hemoptysis, shortness of breath and difficulty breathing.   Cardiovascular: Negative for chest pain, palpitations, hypertension and swelling in legs/feet.  Gastrointestinal: Negative for blood in stool, constipation and diarrhea.  Endocrine: Negative for excessive thirst and increased urination.  Genitourinary: Negative for difficulty urinating and painful urination.  Musculoskeletal: Positive for arthralgias, joint pain, joint swelling, morning stiffness and muscle tenderness. Negative for myalgias, muscle weakness and myalgias.  Skin: Negative for color change, pallor, rash, hair loss, nodules/bumps, skin  tightness, ulcers and sensitivity to sunlight.  Allergic/Immunologic: Negative for susceptible to infections.  Neurological: Negative for dizziness and headaches.  Hematological: Positive for bruising/bleeding tendency. Negative for swollen glands.  Psychiatric/Behavioral: Negative for depressed mood and sleep disturbance. The patient is not nervous/anxious.     PMFS History:  Patient Active Problem List   Diagnosis Date Noted  . Bilateral carpal tunnel syndrome 04/14/2018  . Right arm pain 01/07/2018  . Cervical spinal stenosis 01/07/2018  . Cervical radiculopathy 01/07/2018  . Stenosis of left subclavian artery ([place]) 12/15/2017  . History of asthma 06/25/2017  . History of gastroesophageal reflux (GERD) 06/25/2017  . History of anxiety 06/25/2017  . History of bilateral carpal tunnel release 06/25/2017  . Former smoker 01/25/2017  . Rheumatoid arthritis involving multiple sites with positive rheumatoid factor ([place]) 01/17/2017  . High risk medication use 01/17/2017  . Primary osteoarthritis of both hands 01/17/2017  . Primary osteoarthritis of both feet 01/17/2017  . DJD (degenerative joint disease), cervical 01/17/2017  . Spondylosis of lumbar region without myelopathy or radiculopathy 01/17/2017  . Syncope and collapse 07/07/2016  . Cardiomyopathy, ischemic 07/07/2016  . CAD- S/P LAD thrombectomy 07/04/16 07/07/2016  . Pacemaker-MDT BiV placed 07/05/16 07/07/2016  . History of rheumatoid arthritis 07/07/2016  . Complete heart block ([place]) 07/04/2016  . Essential hypertension 07/04/2016  . LBBB (left bundle branch block) 07/04/2016  . Ventricular tachycardia ([place]) 07/04/2016  . Faintness 07/04/2016  . NSTEMI (non-ST elevated myocardial infarction) ([place]) 07/04/2016  . Exostosis 06/18/2011  . History of breast cancer 02/16/2010  . Vitamin D deficiency 02/15/2010  .  HYPERCHOLESTEROLEMIA 02/15/2010  . ANXIETY 02/15/2010  . Asthma 02/15/2010  . MIGRAINES, HX OF 02/15/2010  .  MASTECTOMY, LEFT, HX OF 02/15/2010    Past Medical History:  Diagnosis Date  . Anxiety   . Asthma   . Breast cancer ([place])    left breast  . Carpal tunnel syndrome   . Depression   . Habitual alcohol use   . Heart attack ([place])   . Hypertension   . Personal history of chemotherapy   . Personal history of radiation therapy   . RA (rheumatoid arthritis) ([place])   . Spinal stenosis   . Vision abnormalities     Family History  Problem Relation Age of Onset  . Stroke Mother   . Cancer Father        colon?  . Pyelonephritis Sister   . Cancer Paternal Aunt   . Cancer Paternal Uncle    Past Surgical History:  Procedure Laterality Date  . BREAST BIOPSY    . BREAST LUMPECTOMY  2005   left  . CARDIAC CATHETERIZATION N/A 07/04/2016   Procedure: Left Heart Cath and Coronary Angiography;  Surgeon: Belva Crome, MD;  Location: [facility];  Service: Cardiovascular;  Laterality: N/A;  . CARDIAC CATHETERIZATION N/A 07/04/2016   Procedure: Temporary Pacemaker;  Surgeon: Belva Crome, MD;  Location: [facility];  Service: Cardiovascular;  Laterality: N/A;  . CARPAL TUNNEL RELEASE    . CATARACT EXTRACTION Bilateral   . EP IMPLANTABLE DEVICE N/A 07/05/2016   Procedure: Pacemaker Implant;  Surgeon: Evans Lance, MD;  Location: [facility];  Service: Cardiovascular;  Laterality: N/A;  . [place]  . LYMPHECTOMY  2005   Social History   Social History Narrative  . Not on file   Immunization History  Administered Date(s) Administered  . Influenza Whole 08/21/2009, 08/29/2010  . PFIZER SARS-COV-2 Vaccination 01/14/2020, 02/08/2020  . Td 01/26/2018     Objective: Vital Signs: BP (!) 108/59 (BP Location: Right Arm, Patient Position: Sitting, Cuff Size: Normal)   Pulse 83   Resp 16   Ht 5\' 2"  (1.575 m)   Wt 140 lb 12.8 oz (63.9 kg)   BMI 25.75 kg/m    Physical Exam Vitals and nursing note reviewed.  Constitutional:      Appearance: She is  well-developed.  HENT:     Head: Normocephalic and atraumatic.  Eyes:     Conjunctiva/sclera: Conjunctivae normal.  Pulmonary:     Effort: Pulmonary effort is normal.  Abdominal:     General: Bowel sounds are normal.     Palpations: Abdomen is soft.  Musculoskeletal:     Cervical back: Normal range of motion.  Lymphadenopathy:     Cervical: No cervical adenopathy.  Skin:    General: Skin is warm and dry.     Capillary Refill: Capillary refill takes less than 2 seconds.  Neurological:     Mental Status: She is alert and oriented to person, place, and time.  Psychiatric:        Behavior: Behavior normal.      Musculoskeletal Exam: C-spine limited ROM with lateral rotation.  Left shoulder abduction and forward flexion to about 90 degrees.  Pain with internal rotation of the left shoulder.  Tenderness at the deltoid insertion site of the left shoulder. Right shoulder has full ROM with no discomfort. Right elbow joint contracture.  Wrist joints, MCPs, PIPs, and DIPs good ROM with no synovitis. Right 1st and 2nd  MCP synovial thickening.  Tenderness and synovitis of the right 4th PIP joint.  Left 1st, 2nd, and 3rd MCP joint synovial thickening. Ulnar deviation bilaterally.  PIP and DIP thickening consistent with osteoarthritis. Hip joints, knee joints, ankle joints good ROM with no inflammation.  No warmth or effusion of knee joints.  No tenderness or inflammation of ankle joints. Overcrowding of toes.   CDAI Exam: CDAI Score: 2.8  Patient Global: 4 mm; Provider Global: 4 mm Swollen: 1 ; Tender: 1  Joint Exam 03/02/2020      Right  Left  PIP 4  Swollen Tender        Investigation: No additional findings.  Imaging: DG BONE DENSITY (DXA)  Result Date: 02/21/2020 EXAM: DUAL X-RAY ABSORPTIOMETRY (DXA) FOR BONE MINERAL DENSITY IMPRESSION: Referring Physician:  Glenis Smoker Your patient completed a BMD test using Lunar IDXA DXA system ( analysis version: 16 ) manufactured by [employer]. Technologist: WLS PATIENT: Name: Bret, Otterstrom Patient ID: [id number] Birth Date: 1946/11/11 Height: 62.0 in. Sex: Female Measured: 02/21/2020 Weight: 138.6 lbs. Indications: Advanced Age, Breast Cancer History, Caucasian, Estrogen Deficient, Family Hist. (Parent hip fracture), Glucocorticoids (Chronic) (255.41), Height Loss (781.91), Postmenopausal, Rheumatoid Arthritis (714.0) Fractures: None Treatments: Calcium (E943.0), Vitamin D (E933.5) ASSESSMENT: The BMD measured at Forearm Radius 33% is 0.820 g/cm2 with a T-score of -0.8. This patient is considered NORMAL according to [facility]) criteria. The scan quality is good. Lumbar spine was not utilized due to advanced degenerative changes. Site Region Measured Date Measured Age YA T-score BMD Significant CHANGE DualFemur Neck Left 02/21/2020 76.0 -0.8 0.929 g/cm2 Left Forearm Radius 33% 02/21/2020 76.0 -0.8 0.820 g/cm2 DualFemur Total Mean 02/21/2020 76.0 0.1 1.018 g/cm2 World Health Organization [facility]) criteria for post-menopausal, Caucasian Women: Normal       T-score at or above -1 SD Osteopenia   T-score between -1 and -2.5 SD Osteoporosis T-score at or below -2.5 SD RECOMMENDATION: 1. All patients should optimize calcium and vitamin D intake. 2. Consider FDA approved medical therapies in postmenopausal women and men aged 81 years and older, based on the following: a. A hip or vertebral (clinical or morphometric) fracture b. T-score < or = -2.5 at the femoral neck or spine after appropriate evaluation to exclude secondary causes c. Low bone mass (T-score between -1.0 and -2.5 at the femoral neck or spine) and a 10 year probability of a hip fracture > or = 3% or a 10 year probability of a major osteoporosis-related fracture > or = 20% based on the [place]-adapted WHO algorithm d. Clinician judgment and/or patient preferences may indicate treatment for people with 10-year fracture probabilities above or below these levels FOLLOW-UP:  Patients with diagnosis of osteoporosis or at high risk for fracture should have regular bone mineral density tests. For patients eligible for Medicare, routine testing is allowed once every 2 years. The testing frequency can be increased to one year for patients who have rapidly progressing disease, those who are receiving or discontinuing medical therapy to restore bone mass, or have additional risk factors. I have reviewed this report and agree with the above findings. Mark A. Thornton Papas, M.D. [facility] Radiology Electronically Signed   By: Lavonia Dana M.D.   On: 02/21/2020 19:21    Recent Labs: Lab Results  Component Value Date   WBC 5.4 01/11/2020   HGB 13.7 01/11/2020   PLT 266 01/11/2020   NA 138 01/11/2020   K 4.0 01/11/2020   CL 100 01/11/2020  CO2 32 01/11/2020   GLUCOSE 75 01/11/2020   BUN 14 01/11/2020   CREATININE 0.73 01/11/2020   BILITOT 0.5 01/11/2020   ALKPHOS 66 12/13/2017   AST 34 01/11/2020   ALT 21 01/11/2020   PROT 6.1 01/11/2020   ALBUMIN 3.5 12/13/2017   CALCIUM 9.2 01/11/2020   GFRAA 93 01/11/2020    Speciality Comments: PLQ Eye Exam: 08/09/19 WNL @ [facility] Follow up in 1 year  Procedures:  No procedures performed Allergies: Betadine [povidone iodine] and Hydrocodone   Assessment / Plan:     Visit Diagnoses: Rheumatoid arthritis involving multiple sites with positive rheumatoid factor (HCC) - +RF, +anti-CCP, +ANA: She has tenderness and synovitis of the right fourth PIP joint.  She experiences intermittent discomfort in both hands and both feet.  She has synovial thickening of several MCP joints as described above.  Ulnar deviation noted bilaterally.  She has overcrowding of toes was noted on exam.  She was encouraged to follow-up with a podiatrist.  Overall she is clinically been doing well on methotrexate 5 tablets by mouth once weekly, folic acid 1 mg by mouth daily, Plaquenil 200 mg 1 tablet twice daily Monday through Friday.  She has not missed any doses of  methotrexate or Plaquenil recently.  She does not want to make any medication changes at this time.  She was advised to notify us if the right fourth PIP joint pain and inflammation does not resolve with Voltaren gel.  She can return for an ultrasound-guided cortisone injection in the future.  She will follow-up in the office in 5 months.  High risk medication use - Methotrexate 5 tablets every 7 days, folic acid 1 mg daily, and Plaquenil 200 mg twice daily Monday through Friday only. Eye Exam: 08/09/19.  CBC and CMP were drawn on 01/11/2020.  She will return for lab work in May and every 3 months to monitor for drug toxicity.  Primary osteoarthritis of both hands: She has PIP and DIP thickening consistent with osteoarthritis of both hands.  She has tenderness and synovitis of the right fourth PIP joint.  She declined an ultrasound-guided cortisone injection at this time.  She plans on using Voltaren gel topically as needed for pain relief.  She was advised to notify us if her joint pain persists or worsens.  Primary osteoarthritis of right knee - She has moderate osteoarthritis and chondrocalcinosis evident on x-ray.  She has good range of motion of the right knee joint on exam.  No warmth or effusion was noted.  Primary osteoarthritis of both feet: She has overcrowding of her toes which has been causing increased discomfort.  She was encouraged to follow-up with a podiatrist to discuss being fitted for orthotics.  History of bilateral carpal tunnel release: She is asymptomatic at this time.  DDD (degenerative disc disease), cervical: She has limited range of motion with lateral rotation.  No symptoms of radiculopathy.  DDD (degenerative disc disease), lumbar: She experiences intermittent lower back pain.  She has no symptoms radiculopathy at this time.  History of vitamin D deficiency: She takes vitamin D 2000 units daily.  Other medical conditions are listed as follows:  History of breast  cancer  History of ventricular tachycardia  History of gastroesophageal reflux (GERD)  History of hyperlipidemia  History of anxiety  History of left bundle branch block  History of hypertension  History of asthma  Former smoker  Orders: No orders of the defined types were placed in this encounter.  No orders  of the defined types were placed in this encounter.     Follow-Up Instructions: Return in about 5 months (around 08/02/2020) for Rheumatoid arthritis, Osteoarthritis.   Leonia Reader  I examined and evaluated the patient with Hazel Sams PA.  Patient had mild synovitis on examination.  She also had synovial thickening.  She does not want to change her medication regimen at this point.  We will continue current medicines.  We will also continue to monitor labs.  The plan of care was discussed as noted above.  Bo Merino, MD  Note - This record has been created using Editor, commissioning.  Chart creation errors have been sought, but may not always  have been located. Such creation errors do not reflect on  the standard of medical care.

## 2020-03-01 ENCOUNTER — Ambulatory Visit: Payer: Medicare Other | Admitting: Rheumatology

## 2020-03-02 ENCOUNTER — Other Ambulatory Visit: Payer: Self-pay

## 2020-03-02 ENCOUNTER — Encounter: Payer: Self-pay | Admitting: Rheumatology

## 2020-03-02 ENCOUNTER — Ambulatory Visit: Payer: Medicare Other | Admitting: Rheumatology

## 2020-03-02 VITALS — BP 108/59 | HR 83 | Resp 16 | Ht 62.0 in | Wt 140.8 lb

## 2020-03-02 DIAGNOSIS — M19071 Primary osteoarthritis, right ankle and foot: Secondary | ICD-10-CM

## 2020-03-02 DIAGNOSIS — M19042 Primary osteoarthritis, left hand: Secondary | ICD-10-CM

## 2020-03-02 DIAGNOSIS — Z8719 Personal history of other diseases of the digestive system: Secondary | ICD-10-CM

## 2020-03-02 DIAGNOSIS — Z853 Personal history of malignant neoplasm of breast: Secondary | ICD-10-CM

## 2020-03-02 DIAGNOSIS — M19072 Primary osteoarthritis, left ankle and foot: Secondary | ICD-10-CM

## 2020-03-02 DIAGNOSIS — M1711 Unilateral primary osteoarthritis, right knee: Secondary | ICD-10-CM

## 2020-03-02 DIAGNOSIS — M19041 Primary osteoarthritis, right hand: Secondary | ICD-10-CM

## 2020-03-02 DIAGNOSIS — Z8639 Personal history of other endocrine, nutritional and metabolic disease: Secondary | ICD-10-CM

## 2020-03-02 DIAGNOSIS — M5136 Other intervertebral disc degeneration, lumbar region: Secondary | ICD-10-CM

## 2020-03-02 DIAGNOSIS — M0579 Rheumatoid arthritis with rheumatoid factor of multiple sites without organ or systems involvement: Secondary | ICD-10-CM

## 2020-03-02 DIAGNOSIS — Z79899 Other long term (current) drug therapy: Secondary | ICD-10-CM | POA: Diagnosis not present

## 2020-03-02 DIAGNOSIS — Z8659 Personal history of other mental and behavioral disorders: Secondary | ICD-10-CM

## 2020-03-02 DIAGNOSIS — Z8679 Personal history of other diseases of the circulatory system: Secondary | ICD-10-CM

## 2020-03-02 DIAGNOSIS — M5030 Other cervical disc degeneration, unspecified cervical region: Secondary | ICD-10-CM

## 2020-03-02 DIAGNOSIS — Z8709 Personal history of other diseases of the respiratory system: Secondary | ICD-10-CM

## 2020-03-02 DIAGNOSIS — Z87891 Personal history of nicotine dependence: Secondary | ICD-10-CM

## 2020-03-02 DIAGNOSIS — Z9889 Other specified postprocedural states: Secondary | ICD-10-CM

## 2020-03-02 NOTE — Patient Instructions (Signed)
Standing Labs We placed an order today for your standing lab work.    Please come back and get your standing labs in May and every 3 months   We have open lab daily Monday through Thursday from 8:30-12:30 PM and 1:30-4:30 PM and Friday from 8:30-12:30 PM and 1:30-4:00 PM at the office of Dr. Shaili Deveshwar.   You may experience shorter wait times on Monday and Friday afternoons. The office is located at [address], [address], [place], [place] [address] No appointment is necessary.   Labs are drawn by [facility].  You may receive a bill from [facility] for your lab work.  If you wish to have your labs drawn at another location, please call the office 24 hours in advance to send orders.  If you have any questions regarding directions or hours of operation,  please call [phone].   Just as a reminder please drink plenty of water prior to coming for your lab work. Thanks!   

## 2020-03-03 ENCOUNTER — Ambulatory Visit: Payer: Medicare Other | Admitting: Rheumatology

## 2020-04-25 ENCOUNTER — Ambulatory Visit (INDEPENDENT_AMBULATORY_CARE_PROVIDER_SITE_OTHER): Payer: Medicare Other | Admitting: *Deleted

## 2020-04-25 DIAGNOSIS — I472 Ventricular tachycardia, unspecified: Secondary | ICD-10-CM

## 2020-04-25 DIAGNOSIS — I442 Atrioventricular block, complete: Secondary | ICD-10-CM

## 2020-04-26 ENCOUNTER — Telehealth: Payer: Self-pay

## 2020-04-26 LAB — CUP PACEART REMOTE DEVICE CHECK
Battery Remaining Longevity: 64 mo
Battery Voltage: 3.01 V
Brady Statistic AP VP Percent: 0.14 %
Brady Statistic AP VS Percent: 0.03 %
Brady Statistic AS VP Percent: 99.83 %
Brady Statistic AS VS Percent: 0.01 %
Brady Statistic RA Percent Paced: 0.17 %
Brady Statistic RV Percent Paced: 99.92 %
Date Time Interrogation Session: 20210609201430
Implantable Lead Implant Date: 20170818
Implantable Lead Implant Date: 20170818
Implantable Lead Implant Date: 20170818
Implantable Lead Location: 753858
Implantable Lead Location: 753859
Implantable Lead Location: 753860
Implantable Lead Model: 4396
Implantable Lead Model: 5076
Implantable Lead Model: 5076
Implantable Pulse Generator Implant Date: 20170818
Lead Channel Impedance Value: 342 Ohm
Lead Channel Impedance Value: 456 Ohm
Lead Channel Impedance Value: 456 Ohm
Lead Channel Impedance Value: 456 Ohm
Lead Channel Impedance Value: 513 Ohm
Lead Channel Impedance Value: 532 Ohm
Lead Channel Impedance Value: 570 Ohm
Lead Channel Impedance Value: 627 Ohm
Lead Channel Impedance Value: 855 Ohm
Lead Channel Pacing Threshold Amplitude: 0.375 V
Lead Channel Pacing Threshold Amplitude: 0.5 V
Lead Channel Pacing Threshold Amplitude: 0.625 V
Lead Channel Pacing Threshold Pulse Width: 0.4 ms
Lead Channel Pacing Threshold Pulse Width: 0.4 ms
Lead Channel Pacing Threshold Pulse Width: 0.4 ms
Lead Channel Sensing Intrinsic Amplitude: 1.375 mV
Lead Channel Sensing Intrinsic Amplitude: 1.375 mV
Lead Channel Sensing Intrinsic Amplitude: 3.75 mV
Lead Channel Sensing Intrinsic Amplitude: 3.75 mV
Lead Channel Setting Pacing Amplitude: 1.5 V
Lead Channel Setting Pacing Amplitude: 1.75 V
Lead Channel Setting Pacing Amplitude: 2 V
Lead Channel Setting Pacing Pulse Width: 0.4 ms
Lead Channel Setting Pacing Pulse Width: 0.4 ms
Lead Channel Setting Sensing Sensitivity: 0.9 mV

## 2020-04-26 NOTE — Telephone Encounter (Signed)
Spoke with patient to remind of missed remote transmission 

## 2020-04-27 NOTE — Progress Notes (Signed)
Remote pacemaker transmission.   

## 2020-05-03 ENCOUNTER — Other Ambulatory Visit: Payer: Self-pay | Admitting: *Deleted

## 2020-05-03 DIAGNOSIS — M0579 Rheumatoid arthritis with rheumatoid factor of multiple sites without organ or systems involvement: Secondary | ICD-10-CM

## 2020-05-03 MED ORDER — HYDROXYCHLOROQUINE SULFATE 200 MG PO TABS: ORAL_TABLET | ORAL | 0 refills | Status: DC

## 2020-05-03 NOTE — Telephone Encounter (Signed)
Refill request received via fax  Last Visit: 03/02/2020 Next Visit: 08/02/2020 Labs: 01/11/2020 WNL PLQ Eye Exam: 08/09/19 WNL  Current Dose per office note 03/02/2020: Plaquenil 200 mg twice daily Monday through Friday only  Okay to refill per Dr. Estanislado Pandy

## 2020-05-08 ENCOUNTER — Other Ambulatory Visit: Payer: Self-pay | Admitting: *Deleted

## 2020-05-08 MED ORDER — METHOTREXATE 2.5 MG PO TABS: ORAL_TABLET | ORAL | 0 refills | Status: DC

## 2020-05-08 NOTE — Telephone Encounter (Signed)
Refill request received via fax  Last Visit: 03/02/2020 Next Visit: 08/02/2020 Labs: 01/11/2020 WNL  Current Dose dose per office note 03/02/2020: Methotrexate 5 tablets every 7 days DX:  Rheumatoid arthritis   Patient advised she is due to update labs. Patient states she will come for labs 05/09/2020 or 05/10/2020.  Okay to refill 30 day supply MTX?

## 2020-05-09 ENCOUNTER — Other Ambulatory Visit: Payer: Self-pay | Admitting: Interventional Cardiology

## 2020-05-25 ENCOUNTER — Other Ambulatory Visit: Payer: Self-pay

## 2020-05-25 DIAGNOSIS — Z79899 Other long term (current) drug therapy: Secondary | ICD-10-CM

## 2020-05-25 LAB — COMPLETE METABOLIC PANEL WITH GFR
AG Ratio: 1.4 (calc) (ref 1.0–2.5)
ALT: 24 U/L (ref 6–29)
AST: 38 U/L — ABNORMAL HIGH (ref 10–35)
Albumin: 3.7 g/dL (ref 3.6–5.1)
Alkaline phosphatase (APISO): 63 U/L (ref 37–153)
BUN: 13 mg/dL (ref 7–25)
CO2: 33 mmol/L — ABNORMAL HIGH (ref 20–32)
Calcium: 9.5 mg/dL (ref 8.6–10.4)
Chloride: 101 mmol/L (ref 98–110)
Creat: 0.64 mg/dL (ref 0.60–0.93)
GFR, Est African American: 100 mL/min/{1.73_m2} (ref >=60)
GFR, Est Non African American: 87 mL/min/{1.73_m2} (ref >=60)
Globulin: 2.6 g/dL (calc) (ref 1.9–3.7)
Glucose, Bld: 86 mg/dL (ref 65–99)
Potassium: 4.3 mmol/L (ref 3.5–5.3)
Sodium: 139 mmol/L (ref 135–146)
Total Bilirubin: 0.6 mg/dL (ref 0.2–1.2)
Total Protein: 6.3 g/dL (ref 6.1–8.1)

## 2020-05-25 LAB — CBC WITH DIFFERENTIAL/PLATELET
Absolute Monocytes: 646 cells/uL (ref 200–950)
Basophils Absolute: 77 cells/uL (ref 0–200)
Basophils Relative: 1.2 %
Eosinophils Absolute: 461 cells/uL (ref 15–500)
Eosinophils Relative: 7.2 %
HCT: 40.7 % (ref 35.0–45.0)
Hemoglobin: 13.9 g/dL (ref 11.7–15.5)
Lymphs Abs: 1178 cells/uL (ref 850–3900)
MCH: 32.8 pg (ref 27.0–33.0)
MCHC: 34.2 g/dL (ref 32.0–36.0)
MCV: 96 fL (ref 80.0–100.0)
MPV: 9.5 fL (ref 7.5–12.5)
Monocytes Relative: 10.1 %
Neutro Abs: 4038 cells/uL (ref 1500–7800)
Neutrophils Relative %: 63.1 %
Platelets: 289 10*3/uL (ref 140–400)
RBC: 4.24 10*6/uL (ref 3.80–5.10)
RDW: 13.2 % (ref 11.0–15.0)
Total Lymphocyte: 18.4 %
WBC: 6.4 10*3/uL (ref 3.8–10.8)

## 2020-06-01 NOTE — Progress Notes (Signed)
Please advise the patient to only take tylenol sparingly for pain relief.  It is also recommended that she avoid alcohol use while taking MTX.

## 2020-06-05 ENCOUNTER — Other Ambulatory Visit: Payer: Self-pay | Admitting: Rheumatology

## 2020-06-05 ENCOUNTER — Telehealth: Payer: Self-pay | Admitting: Rheumatology

## 2020-06-05 NOTE — Telephone Encounter (Signed)
Last Visit:03/02/2020 Next Visit:08/02/2020 Labs: 05/25/2020 AST is borderline elevated-38. ALT is WNL. CBC WNL.   Current Dose dose per office note 03/02/2020: Methotrexate 5 tablets every 7 days DX: Rheumatoid arthritis   Okay to refill MTX?

## 2020-06-05 NOTE — Telephone Encounter (Signed)
Patient advised prescription sent to the pharmacy.  

## 2020-06-05 NOTE — Telephone Encounter (Signed)
Patient request refill on MTX 2.5 mg sent to [facility] on [address]/ [address].

## 2020-06-08 ENCOUNTER — Other Ambulatory Visit: Payer: Self-pay | Admitting: Oncology

## 2020-06-15 ENCOUNTER — Telehealth: Payer: Self-pay | Admitting: *Deleted

## 2020-06-15 ENCOUNTER — Other Ambulatory Visit: Payer: Self-pay | Admitting: Oncology

## 2020-06-15 ENCOUNTER — Encounter: Payer: Self-pay | Admitting: Oncology

## 2020-06-15 NOTE — Telephone Encounter (Signed)
Dr Andy Gauss office called to say they want to start her on estrogen. Want to know if it is OK to start with her history of breast cancer?

## 2020-07-21 NOTE — Progress Notes (Signed)
Office Visit Note  Patient: [name]             Date of Birth: 02-16-1946           MRN: [id number]             PCP: Maurice Small, MD Referring: Maurice Small, MD Visit Date: 08/02/2020 Occupation: @GUAROCC @  Subjective:  Pain of the Right Hand   History of Present Illness: [name] Wass is a 74 y.o. female with history of rheumatoid arthritis and osteoarthritis.  She states she has been experiencing pain and discomfort in her right middle finger.  She states it locks sometimes on her.  She had similar symptoms with the other finger in the past which resolved.  She has been tolerating methotrexate and Plaquenil well and denies any side effects.  She states she has some neck discomfort which is manageable.  She is to have left-sided radiculopathy which is improved.  She wants to discuss her DEXA results.  Activities of Daily Living:  Patient reports morning stiffness for 30 minutes.   Patient Denies nocturnal pain.  Difficulty dressing/grooming: Reports Difficulty climbing stairs: Denies Difficulty getting out of chair: Denies Difficulty using hands for taps, buttons, cutlery, and/or writing: Reports  Review of Systems  Constitutional: Negative for fatigue.  HENT: Positive for mouth dryness and nose dryness. Negative for mouth sores.   Eyes: Positive for pain, itching and dryness. Negative for visual disturbance.  Respiratory: Negative for shortness of breath and difficulty breathing.   Cardiovascular: Negative for chest pain, palpitations and swelling in legs/feet.  Gastrointestinal: Negative for constipation and diarrhea.  Endocrine: Negative for increased urination.  Genitourinary: Negative for difficulty urinating and painful urination.  Musculoskeletal: Positive for arthralgias, joint pain, joint swelling and morning stiffness. Negative for myalgias, muscle weakness, muscle tenderness and myalgias.  Skin: Negative for color change, rash and redness.    Allergic/Immunologic: Negative for susceptible to infections.  Neurological: Positive for numbness. Negative for dizziness and headaches.  Hematological: Positive for bruising/bleeding tendency.  Psychiatric/Behavioral: Negative for confusion and sleep disturbance.    PMFS History:  Patient Active Problem List   Diagnosis Date Noted  . Bilateral carpal tunnel syndrome 04/14/2018  . Right arm pain 01/07/2018  . Cervical spinal stenosis 01/07/2018  . Cervical radiculopathy 01/07/2018  . Stenosis of left subclavian artery ([place]) 12/15/2017  . History of asthma 06/25/2017  . History of gastroesophageal reflux (GERD) 06/25/2017  . History of anxiety 06/25/2017  . History of bilateral carpal tunnel release 06/25/2017  . Former smoker 01/25/2017  . Rheumatoid arthritis involving multiple sites with positive rheumatoid factor ([place]) 01/17/2017  . High risk medication use 01/17/2017  . Primary osteoarthritis of both hands 01/17/2017  . Primary osteoarthritis of both feet 01/17/2017  . DJD (degenerative joint disease), cervical 01/17/2017  . Spondylosis of lumbar region without myelopathy or radiculopathy 01/17/2017  . Syncope and collapse 07/07/2016  . Cardiomyopathy, ischemic 07/07/2016  . CAD- S/P LAD thrombectomy 07/04/16 07/07/2016  . Pacemaker-MDT BiV placed 07/05/16 07/07/2016  . History of rheumatoid arthritis 07/07/2016  . Complete heart block ([place]) 07/04/2016  . Essential hypertension 07/04/2016  . LBBB (left bundle branch block) 07/04/2016  . Ventricular tachycardia ([place]) 07/04/2016  . Faintness 07/04/2016  . NSTEMI (non-ST elevated myocardial infarction) ([place]) 07/04/2016  . Exostosis 06/18/2011  . History of breast cancer 02/16/2010  . Vitamin D deficiency 02/15/2010  . HYPERCHOLESTEROLEMIA 02/15/2010  . ANXIETY 02/15/2010  . Asthma 02/15/2010  . MIGRAINES, HX OF 02/15/2010  .  MASTECTOMY, LEFT, HX OF 02/15/2010    Past Medical History:  Diagnosis Date  . Anxiety   .  Asthma   . Breast cancer ([place])    left breast  . Carpal tunnel syndrome   . Depression   . Habitual alcohol use   . Heart attack ([place])   . Hypertension   . Personal history of chemotherapy   . Personal history of radiation therapy   . RA (rheumatoid arthritis) ([place])   . Spinal stenosis   . Vision abnormalities     Family History  Problem Relation Age of Onset  . Stroke Mother   . Cancer Father        colon?  . Pyelonephritis Sister   . Cancer Paternal Aunt   . Cancer Paternal Uncle    Past Surgical History:  Procedure Laterality Date  . BREAST BIOPSY    . BREAST LUMPECTOMY  2005   left  . CARDIAC CATHETERIZATION N/A 07/04/2016   Procedure: Left Heart Cath and Coronary Angiography;  Surgeon: Belva Crome, MD;  Location: [facility];  Service: Cardiovascular;  Laterality: N/A;  . CARDIAC CATHETERIZATION N/A 07/04/2016   Procedure: Temporary Pacemaker;  Surgeon: Belva Crome, MD;  Location: [facility];  Service: Cardiovascular;  Laterality: N/A;  . CARPAL TUNNEL RELEASE    . CATARACT EXTRACTION Bilateral   . EP IMPLANTABLE DEVICE N/A 07/05/2016   Procedure: Pacemaker Implant;  Surgeon: Evans Lance, MD;  Location: [facility];  Service: Cardiovascular;  Laterality: N/A;  . [place]  . LYMPHECTOMY  2005   Social History   Social History Narrative  . Not on file   Immunization History  Administered Date(s) Administered  . Influenza Whole 08/21/2009, 08/29/2010  . PFIZER SARS-COV-2 Vaccination 01/14/2020, 02/08/2020  . Td 01/26/2018     Objective: Vital Signs: BP 125/63 (BP Location: Right Arm, Patient Position: Sitting, Cuff Size: Small)   Pulse 88   Resp 14   Ht 5\' 2"  (1.575 m)   Wt 137 lb 3.2 oz (62.2 kg)   BMI 25.09 kg/m    Physical Exam Vitals and nursing note reviewed.  Constitutional:      Appearance: She is well-developed.  HENT:     Head: Normocephalic and atraumatic.  Eyes:     Conjunctiva/sclera: Conjunctivae  normal.  Cardiovascular:     Rate and Rhythm: Normal rate and regular rhythm.     Heart sounds: Normal heart sounds.     Comments: pacemaker Pulmonary:     Effort: Pulmonary effort is normal.     Breath sounds: Normal breath sounds.  Abdominal:     General: Bowel sounds are normal.     Palpations: Abdomen is soft.  Musculoskeletal:     Cervical back: Normal range of motion.  Lymphadenopathy:     Cervical: No cervical adenopathy.  Skin:    General: Skin is warm and dry.     Capillary Refill: Capillary refill takes less than 2 seconds.  Neurological:     Mental Status: She is alert and oriented to person, place, and time.  Psychiatric:        Behavior: Behavior normal.      Musculoskeletal Exam: She has limited range of motion of her cervical spine.  She has some postural kyphosis.  She had discomfort range of motion of her left shoulder joint.  She is contracture in her right elbow.  She has bilateral MCP thickening, PIP and DIP thickening.  She had right third flexor tendon tenosynovitis.  Hip joints and knee joints were in good range of motion.  There was no tenderness across MTPs.  CDAI Exam: CDAI Score: 0.4  Patient Global: 2 mm; Provider Global: 2 mm Swollen: 0 ; Tender: 0  Joint Exam 08/02/2020   No joint exam has been documented for this visit   There is currently no information documented on the homunculus. Go to the Rheumatology activity and complete the homunculus joint exam.  Investigation: No additional findings.  Imaging: CUP PACEART REMOTE DEVICE CHECK  Result Date: 07/26/2020 Scheduled remote reviewed. Normal device function.  Optivol is elevated, thoracic impedance returning to baseline Next remote 91 days. JM   Recent Labs: Lab Results  Component Value Date   WBC 6.4 05/25/2020   HGB 13.9 05/25/2020   PLT 289 05/25/2020   NA 139 05/25/2020   K 4.3 05/25/2020   CL 101 05/25/2020   CO2 33 (H) 05/25/2020   GLUCOSE 86 05/25/2020   BUN 13 05/25/2020     CREATININE 0.64 05/25/2020   BILITOT 0.6 05/25/2020   ALKPHOS 66 12/13/2017   AST 38 (H) 05/25/2020   ALT 24 05/25/2020   PROT 6.3 05/25/2020   ALBUMIN 3.5 12/13/2017   CALCIUM 9.5 05/25/2020   GFRAA 100 05/25/2020    Speciality Comments: PLQ Eye Exam: 08/09/19 WNL @ [facility] Follow up in 1 year  Procedures:  No procedures performed Allergies: Betadine [povidone iodine] and Hydrocodone   Assessment / Plan:     Visit Diagnoses: Rheumatoid arthritis involving multiple sites with positive rheumatoid factor (HCC) -  +RF, +anti-CCP, +ANA: She has synovial thickening over bilateral second and third MCP joint.  She has been tolerating methotrexate and Plaquenil well.  Pain in both hands -I will obtain x-rays today as she has intermittent discomfort in her hands.  Plan: XR Hand 2 View Right, XR Hand 2 View Left.  X-ray findings are consistent with rheumatoid arthritis and osteoarthritis overlap.  No radiographic progression was noted when compared to the x-rays of 2019.  Trigger finger, right middle finger-I offered cortisone injection which she declined.  Conservative therapy was discussed.  High risk medication use - Methotrexate 5 tablets every 7 days, folic acid 1 mg daily, and Plaquenil 200 mg twice daily Monday through Friday only. PLQ Eye Exam: 08/09/19.  She has eye exam scheduled next week.  Primary osteoarthritis of both hands-she has bilateral PIP and DIP thickening.  Joint protection was discussed.  Primary osteoarthritis of right knee - She has moderate osteoarthritis and chondrocalcinosis evident on x-ray.  She has chronic pain.  Primary osteoarthritis of both feet-proper fitting shoes were discussed.  DDD (degenerative disc disease), cervical-she has limited range of motion some stiffness.  DDD (degenerative disc disease), lumbar  History of ventricular tachycardia  History of vitamin D deficiency  History of breast cancer  History of hypertension  History of left  bundle branch block  History of gastroesophageal reflux (GERD)  History of asthma  History of anxiety  History of hyperlipidemia  Former smoker  Osteoporosis screening - April 5, 2021DualFemur Neck Left 02/21/2020 76.0 -0.8 0.929 g/cm2.  I reviewed DEXA results with the patient.  Educated about COVID-19 virus infection-she is fully vaccinated against Covid.  She can get a booster.  Instructions were given to delay methotrexate by 1 week after discharge.  Use of mask, social distancing and hand hygiene was discussed.  All the instructions were placed in the AVS.  Orders: Orders Placed This  Encounter  Procedures  . XR Hand 2 View Right  . XR Hand 2 View Left   No orders of the defined types were placed in this encounter.     Follow-Up Instructions: Return in about 5 months (around 01/02/2021) for Rheumatoid arthritis, Osteoarthritis.   Bo Merino, MD  Note - This record has been created using Editor, commissioning.  Chart creation errors have been sought, but may not always  have been located. Such creation errors do not reflect on  the standard of medical care.

## 2020-07-25 ENCOUNTER — Other Ambulatory Visit: Payer: Self-pay | Admitting: Rheumatology

## 2020-07-25 ENCOUNTER — Ambulatory Visit (INDEPENDENT_AMBULATORY_CARE_PROVIDER_SITE_OTHER): Payer: Medicare Other | Admitting: *Deleted

## 2020-07-25 DIAGNOSIS — I442 Atrioventricular block, complete: Secondary | ICD-10-CM | POA: Diagnosis not present

## 2020-07-25 DIAGNOSIS — M0579 Rheumatoid arthritis with rheumatoid factor of multiple sites without organ or systems involvement: Secondary | ICD-10-CM

## 2020-07-25 NOTE — Telephone Encounter (Signed)
Patient advised she is due to update her PLQ eye exam. Patient states she hasher appointment scheduled.

## 2020-07-25 NOTE — Telephone Encounter (Signed)
Please advise the patient to update her PLQ eye exam this month.

## 2020-07-25 NOTE — Telephone Encounter (Signed)
Last Visit:03/02/2020 Next Visit:08/02/2020 Labs: 05/25/2020 AST is borderline elevated-38. ALT is WNL. CBC WNL.  PLQ Eye Exam: 08/09/19 WNL  Current Dose dose per office note 03/02/2020: Plaquenil 200 mg twice daily Monday through Friday only DX: Rheumatoid arthritis   Okay to refill PLQ?

## 2020-07-26 LAB — CUP PACEART REMOTE DEVICE CHECK
Battery Remaining Longevity: 66 mo
Battery Voltage: 3 V
Brady Statistic AP VP Percent: 0.14 %
Brady Statistic AP VS Percent: 0.03 %
Brady Statistic AS VP Percent: 99.83 %
Brady Statistic AS VS Percent: 0.01 %
Brady Statistic RA Percent Paced: 0.16 %
Brady Statistic RV Percent Paced: 99.94 %
Date Time Interrogation Session: 20210907232052
Implantable Lead Implant Date: 20170818
Implantable Lead Implant Date: 20170818
Implantable Lead Implant Date: 20170818
Implantable Lead Location: 753858
Implantable Lead Location: 753859
Implantable Lead Location: 753860
Implantable Lead Model: 4396
Implantable Lead Model: 5076
Implantable Lead Model: 5076
Implantable Pulse Generator Implant Date: 20170818
Lead Channel Impedance Value: 361 Ohm
Lead Channel Impedance Value: 456 Ohm
Lead Channel Impedance Value: 513 Ohm
Lead Channel Impedance Value: 513 Ohm
Lead Channel Impedance Value: 551 Ohm
Lead Channel Impedance Value: 589 Ohm
Lead Channel Impedance Value: 627 Ohm
Lead Channel Impedance Value: 684 Ohm
Lead Channel Impedance Value: 950 Ohm
Lead Channel Pacing Threshold Amplitude: 0.5 V
Lead Channel Pacing Threshold Amplitude: 0.5 V
Lead Channel Pacing Threshold Amplitude: 0.625 V
Lead Channel Pacing Threshold Pulse Width: 0.4 ms
Lead Channel Pacing Threshold Pulse Width: 0.4 ms
Lead Channel Pacing Threshold Pulse Width: 0.4 ms
Lead Channel Sensing Intrinsic Amplitude: 1.375 mV
Lead Channel Sensing Intrinsic Amplitude: 1.375 mV
Lead Channel Sensing Intrinsic Amplitude: 3.75 mV
Lead Channel Sensing Intrinsic Amplitude: 3.75 mV
Lead Channel Setting Pacing Amplitude: 1.5 V
Lead Channel Setting Pacing Amplitude: 1.75 V
Lead Channel Setting Pacing Amplitude: 2 V
Lead Channel Setting Pacing Pulse Width: 0.4 ms
Lead Channel Setting Pacing Pulse Width: 0.4 ms
Lead Channel Setting Sensing Sensitivity: 0.9 mV

## 2020-07-27 NOTE — Progress Notes (Signed)
Remote pacemaker transmission.   

## 2020-08-02 ENCOUNTER — Ambulatory Visit: Payer: Self-pay

## 2020-08-02 ENCOUNTER — Other Ambulatory Visit: Payer: Self-pay

## 2020-08-02 ENCOUNTER — Ambulatory Visit: Payer: Medicare Other | Admitting: Rheumatology

## 2020-08-02 ENCOUNTER — Encounter: Payer: Self-pay | Admitting: Rheumatology

## 2020-08-02 VITALS — BP 125/63 | HR 88 | Resp 14 | Ht 62.0 in | Wt 137.2 lb

## 2020-08-02 DIAGNOSIS — Z8709 Personal history of other diseases of the respiratory system: Secondary | ICD-10-CM

## 2020-08-02 DIAGNOSIS — Z79899 Other long term (current) drug therapy: Secondary | ICD-10-CM

## 2020-08-02 DIAGNOSIS — M5136 Other intervertebral disc degeneration, lumbar region: Secondary | ICD-10-CM

## 2020-08-02 DIAGNOSIS — M79641 Pain in right hand: Secondary | ICD-10-CM | POA: Diagnosis not present

## 2020-08-02 DIAGNOSIS — M19041 Primary osteoarthritis, right hand: Secondary | ICD-10-CM

## 2020-08-02 DIAGNOSIS — M0579 Rheumatoid arthritis with rheumatoid factor of multiple sites without organ or systems involvement: Secondary | ICD-10-CM | POA: Diagnosis not present

## 2020-08-02 DIAGNOSIS — M19072 Primary osteoarthritis, left ankle and foot: Secondary | ICD-10-CM

## 2020-08-02 DIAGNOSIS — M1711 Unilateral primary osteoarthritis, right knee: Secondary | ICD-10-CM

## 2020-08-02 DIAGNOSIS — M79642 Pain in left hand: Secondary | ICD-10-CM | POA: Diagnosis not present

## 2020-08-02 DIAGNOSIS — Z87891 Personal history of nicotine dependence: Secondary | ICD-10-CM

## 2020-08-02 DIAGNOSIS — M65331 Trigger finger, right middle finger: Secondary | ICD-10-CM | POA: Diagnosis not present

## 2020-08-02 DIAGNOSIS — M19042 Primary osteoarthritis, left hand: Secondary | ICD-10-CM

## 2020-08-02 DIAGNOSIS — Z8659 Personal history of other mental and behavioral disorders: Secondary | ICD-10-CM

## 2020-08-02 DIAGNOSIS — M51369 Other intervertebral disc degeneration, lumbar region without mention of lumbar back pain or lower extremity pain: Secondary | ICD-10-CM

## 2020-08-02 DIAGNOSIS — Z8719 Personal history of other diseases of the digestive system: Secondary | ICD-10-CM

## 2020-08-02 DIAGNOSIS — M5030 Other cervical disc degeneration, unspecified cervical region: Secondary | ICD-10-CM

## 2020-08-02 DIAGNOSIS — Z7189 Other specified counseling: Secondary | ICD-10-CM

## 2020-08-02 DIAGNOSIS — Z8639 Personal history of other endocrine, nutritional and metabolic disease: Secondary | ICD-10-CM

## 2020-08-02 DIAGNOSIS — M19071 Primary osteoarthritis, right ankle and foot: Secondary | ICD-10-CM

## 2020-08-02 DIAGNOSIS — Z13820 Encounter for screening for osteoporosis: Secondary | ICD-10-CM

## 2020-08-02 DIAGNOSIS — Z8679 Personal history of other diseases of the circulatory system: Secondary | ICD-10-CM

## 2020-08-02 DIAGNOSIS — Z853 Personal history of malignant neoplasm of breast: Secondary | ICD-10-CM

## 2020-08-02 NOTE — Patient Instructions (Addendum)
COVID-19 vaccine recommendations:   COVID-19 vaccine is recommended for everyone (unless you are allergic to a vaccine component), even if you are on a medication that suppresses your immune system.   If you are on Methotrexate, Cellcept (mycophenolate), Rinvoq, Morrie Sheldon, and Olumiant- hold the medication for 1 week after each vaccine. Hold Methotrexate for 2 weeks after the single dose COVID-19 vaccine.   If you are on Orencia subcutaneous injection - hold medication one week prior to and one week after the first COVID-19 vaccine dose (only).   If you are on Orencia IV infusions- time vaccination administration so that the first COVID-19 vaccination will occur four weeks after the infusion and postpone the subsequent infusion by one week.   If you are on Cyclophosphamide or Rituxan infusions please contact your doctor prior to receiving the COVID-19 vaccine.   Do not take Tylenol or any anti-inflammatory medications (NSAIDs) 24 hours prior to the COVID-19 vaccination.   There is no direct evidence about the efficacy of the COVID-19 vaccine in individuals who are on medications that suppress the immune system.   Even if you are fully vaccinated, and you are on any medications that suppress your immune system, please continue to wear a mask, maintain at least six feet social distance and practice hand hygiene.   If you develop a COVID-19 infection, please contact your PCP or our office to determine if you need antibody infusion.  The booster vaccine is now available for immunocompromised patients. It is advised that if you had Pfizer vaccine you should get [employer] booster.  If you had a Moderna vaccine then you should get a Moderna booster. Johnson and Wynetta Emery does not have a booster vaccine at this time.  Please see the following web sites for updated information.    https://[email or web address]/[email or web address]/Files/[email or web address]-19-Vaccination-Patient-Resources.[email or web address]  [email or web address]://[email or web address]/About-Us/[email or web address]-Releases/ID/1159  Standing Labs We placed an order today for your standing lab work.   Please have your standing labs drawn in October and every 3 months  If possible, please have your labs drawn 2 weeks prior to your appointment so that the provider can discuss your results at your appointment.  We have open lab daily Monday through Thursday from 8:30-12:30 PM and 1:30-4:30 PM and Friday from 8:30-12:30 PM and 1:30-4:00 PM at the office of Dr. Bo Merino, [facility].   Please be advised, patients with office appointments requiring lab work will take precedents over walk-in lab work.  If possible, please come for your lab work on Monday and Friday afternoons, as you may experience shorter wait times. The office is located at [address] [place], [place], [place] [address] No appointment is necessary.   Labs are drawn by [facility]. Please bring your co-pay at the time of your lab draw.  You may receive a bill from [facility] for your lab work.  If you wish to have your labs drawn at another location, please call the office 24 hours in advance to send orders.  If you have any questions regarding directions or hours of operation,  please call [phone].   As a reminder, please drink plenty of water prior to coming for your lab work. Thanks!

## 2020-09-11 ENCOUNTER — Other Ambulatory Visit: Payer: Self-pay | Admitting: Rheumatology

## 2020-09-11 NOTE — Telephone Encounter (Addendum)
Last Visit: 08/02/2020 Next Visit: 01/03/2021 Labs: 05/25/2020 AST is borderline elevated-38. ALT is WNL. CBC WNL.   Current Dose per office note 08/02/2020: Methotrexate 5 tablets every 7 days DX: Rheumatoid arthritis involving multiple sites with positive rheumatoid factor   Left message to advise patient she is due to update labs.   Okay to refill MTX?

## 2020-09-12 ENCOUNTER — Encounter: Payer: Self-pay | Admitting: Internal Medicine

## 2020-09-12 ENCOUNTER — Other Ambulatory Visit: Payer: Self-pay

## 2020-09-12 ENCOUNTER — Ambulatory Visit: Payer: Medicare Other | Admitting: Internal Medicine

## 2020-09-12 VITALS — BP 116/58 | HR 85 | Ht 62.0 in | Wt 130.0 lb

## 2020-09-12 DIAGNOSIS — I10 Essential (primary) hypertension: Secondary | ICD-10-CM | POA: Diagnosis not present

## 2020-09-12 DIAGNOSIS — I255 Ischemic cardiomyopathy: Secondary | ICD-10-CM

## 2020-09-12 DIAGNOSIS — I442 Atrioventricular block, complete: Secondary | ICD-10-CM | POA: Diagnosis not present

## 2020-09-12 DIAGNOSIS — Z950 Presence of cardiac pacemaker: Secondary | ICD-10-CM | POA: Diagnosis not present

## 2020-09-12 DIAGNOSIS — Z79899 Other long term (current) drug therapy: Secondary | ICD-10-CM

## 2020-09-12 NOTE — Patient Instructions (Signed)

## 2020-09-12 NOTE — Progress Notes (Signed)
HPI Mrs. Wigley returns today for followup of her biv PPM. She is a pleasant 74 yo woman with a  H/o HTN, CAD, s/p MI, chronic systolic heart failure, s/p biv PPM insertion in 2017. In the interim, she notes shehas done well with no chest pain or sob, or edema.  Allergies  Allergen Reactions  . Betadine [Povidone Iodine] Shortness Of Breath  . Hydrocodone Itching     Current Outpatient Medications  Medication Sig Dispense Refill  . albuterol (PROVENTIL HFA;VENTOLIN HFA) 108 (90 Base) MCG/ACT inhaler Inhale into the lungs every 6 (six) hours as needed for wheezing or shortness of breath.    Marland Kitchen aspirin 81 MG tablet Take 81 mg by mouth daily.      Marland Kitchen atorvastatin (LIPITOR) 40 MG tablet TAKE 1 TABLET(40 MG) BY MOUTH DAILY AT 6 PM 90 tablet 2  . budesonide-formoterol (SYMBICORT) 160-4.5 MCG/ACT inhaler Inhale 2 puffs into the lungs 2 (two) times daily.    . calcium carbonate (OS-CAL) 600 MG TABS Take 600 mg by mouth 2 (two) times daily with a meal.      . citalopram (CELEXA) 20 MG tablet Take 20 mg by mouth daily.  3  . clonazePAM (KLONOPIN) 0.5 MG tablet Take 0.5 mg by mouth every 12 (twelve) hours as needed for anxiety.  0  . dicyclomine (BENTYL) 20 MG tablet TK 1 T PO TID B MEALS PRN    . Ergocalciferol (VITAMIN D2) 2000 units TABS Take 2,000 Units by mouth daily.    . folic acid (FOLVITE) 1 MG tablet Take 1 tablet by mouth once daily 90 tablet 3  . hydroxychloroquine (PLAQUENIL) 200 MG tablet TAKE 1 TABLET BY MOUTH TWICE DAILY MONDAY THROUGH FRIDAY ONLY 120 tablet 0  . methotrexate (RHEUMATREX) 2.5 MG tablet TAKE 5 TABLETS BY MOUTH ONCE WEEKLY 20 tablet 0  . nitroGLYCERIN (NITROSTAT) 0.4 MG SL tablet Place 1 tablet (0.4 mg total) under the tongue every 5 (five) minutes as needed for chest pain. 25 tablet 2  . pantoprazole (PROTONIX) 40 MG tablet TAKE 1 TABLET(40 MG) BY MOUTH DAILY 90 tablet 2  . traZODone (DESYREL) 50 MG tablet Take 50 mg by mouth daily as needed.    Marland Kitchen losartan  (COZAAR) 50 MG tablet Take 1 tablet (50 mg total) by mouth daily. 90 tablet 3   No current facility-administered medications for this visit.     Past Medical History:  Diagnosis Date  . Anxiety   . Asthma   . Breast cancer ([place])    left breast  . Carpal tunnel syndrome   . Depression   . Habitual alcohol use   . Heart attack ([place])   . Hypertension   . Personal history of chemotherapy   . Personal history of radiation therapy   . RA (rheumatoid arthritis) ([place])   . Spinal stenosis   . Vision abnormalities     ROS:   All systems reviewed and negative except as noted in the HPI.   Past Surgical History:  Procedure Laterality Date  . BREAST BIOPSY    . BREAST LUMPECTOMY  2005   left  . CARDIAC CATHETERIZATION N/A 07/04/2016   Procedure: Left Heart Cath and Coronary Angiography;  Surgeon: Belva Crome, MD;  Location: [facility];  Service: Cardiovascular;  Laterality: N/A;  . CARDIAC CATHETERIZATION N/A 07/04/2016   Procedure: Temporary Pacemaker;  Surgeon: Belva Crome, MD;  Location: [facility];  Service: Cardiovascular;  Laterality: N/A;  .  CARPAL TUNNEL RELEASE    . CATARACT EXTRACTION Bilateral   . EP IMPLANTABLE DEVICE N/A 07/05/2016   Procedure: Pacemaker Implant;  Surgeon: Evans Lance, MD;  Location: [facility];  Service: Cardiovascular;  Laterality: N/A;  . [place]  . LYMPHECTOMY  2005     Family History  Problem Relation Age of Onset  . Stroke Mother   . Cancer Father        colon?  . Pyelonephritis Sister   . Cancer Paternal Aunt   . Cancer Paternal Uncle      Social History   Socioeconomic History  . Marital status: Widowed    Spouse name: Not on file  . Number of children: Not on file  . Years of education: Not on file  . Highest education level: Not on file  Occupational History  . Not on file  Tobacco Use  . Smoking status: Former Smoker    Packs/day: 1.50    Years: 30.00    Pack years: 45.00     Types: Cigarettes    Quit date: 1995    Years since quitting: 26.8  . Smokeless tobacco: Never Used  Vaping Use  . Vaping Use: Never used  Substance and Sexual Activity  . Alcohol use: Yes    Alcohol/week: 14.0 standard drinks    Types: 14 Cans of beer per week    Comment: 2 DAILY  . Drug use: Never  . Sexual activity: Not on file  Other Topics Concern  . Not on file  Social History Narrative  . Not on file   Social Determinants of Health   Financial Resource Strain:   . Difficulty of Paying Living Expenses: Not on file  Food Insecurity:   . Worried About Charity fundraiser in the Last Year: Not on file  . Ran Out of Food in the Last Year: Not on file  Transportation Needs:   . Lack of Transportation (Medical): Not on file  . Lack of Transportation (Non-Medical): Not on file  Physical Activity:   . Days of Exercise per Week: Not on file  . Minutes of Exercise per Session: Not on file  Stress:   . Feeling of Stress : Not on file  Social Connections:   . Frequency of Communication with Friends and Family: Not on file  . Frequency of Social Gatherings with Friends and Family: Not on file  . Attends Religious Services: Not on file  . Active Member of Clubs or Organizations: Not on file  . Attends Archivist Meetings: Not on file  . Marital Status: Not on file  Intimate Partner Violence:   . Fear of Current or Ex-Partner: Not on file  . Emotionally Abused: Not on file  . Physically Abused: Not on file  . Sexually Abused: Not on file     BP (!) 116/58   Pulse 85   Ht 5\' 2"  (1.575 m)   Wt 130 lb (59 kg)   SpO2 91%   BMI 23.78 kg/m   Physical Exam:  Well appearing 74 yo woman, NAD HEENT: Unremarkable Neck:  No JVD, no thyromegally Lymphatics:  No adenopathy Back:  No CVA tenderness Lungs:  Clear with no wheezes HEART:  Regular rate rhythm, no murmurs, no rubs, no clicks Abd:  soft, positive bowel sounds, no organomegally, no rebound, no  guarding Ext:  2 plus pulses, no edema, no cyanosis, no clubbing Skin:  No rashes no nodules Neuro:  CN II  through XII intact, motor grossly intact  EKG - nsr with biv pacing  DEVICE  Normal device function.  See PaceArt for details.   Assess/Plan: 1. Chronic systolic heart failure -  Her symptoms remain class 2. No change in her meds. 2. Biv PPM - her medtronic biv device is working normally. 3. CAD - she denies anginal symptoms.  4. Dyslipidemia - she will continue her statin therapy with lipitor.  [name] Damarius Karnes,MD

## 2020-09-13 ENCOUNTER — Telehealth: Payer: Self-pay | Admitting: *Deleted

## 2020-09-13 LAB — COMPLETE METABOLIC PANEL WITH GFR
AG Ratio: 1.5 (calc) (ref 1.0–2.5)
ALT: 25 U/L (ref 6–29)
AST: 38 U/L — ABNORMAL HIGH (ref 10–35)
Albumin: 3.8 g/dL (ref 3.6–5.1)
Alkaline phosphatase (APISO): 61 U/L (ref 37–153)
BUN: 16 mg/dL (ref 7–25)
CO2: 30 mmol/L (ref 20–32)
Calcium: 9.4 mg/dL (ref 8.6–10.4)
Chloride: 101 mmol/L (ref 98–110)
Creat: 0.74 mg/dL (ref 0.60–0.93)
GFR, Est African American: 91 mL/min/{1.73_m2} (ref >=60)
GFR, Est Non African American: 79 mL/min/{1.73_m2} (ref >=60)
Globulin: 2.6 g/dL (calc) (ref 1.9–3.7)
Glucose, Bld: 96 mg/dL (ref 65–99)
Potassium: 4.7 mmol/L (ref 3.5–5.3)
Sodium: 138 mmol/L (ref 135–146)
Total Bilirubin: 0.4 mg/dL (ref 0.2–1.2)
Total Protein: 6.4 g/dL (ref 6.1–8.1)

## 2020-09-13 LAB — CBC WITH DIFFERENTIAL/PLATELET
Absolute Monocytes: 766 cells/uL (ref 200–950)
Basophils Absolute: 112 cells/uL (ref 0–200)
Basophils Relative: 1.7 %
Eosinophils Absolute: 370 cells/uL (ref 15–500)
Eosinophils Relative: 5.6 %
HCT: 40.2 % (ref 35.0–45.0)
Hemoglobin: 13.7 g/dL (ref 11.7–15.5)
Lymphs Abs: 1445 cells/uL (ref 850–3900)
MCH: 32.6 pg (ref 27.0–33.0)
MCHC: 34.1 g/dL (ref 32.0–36.0)
MCV: 95.7 fL (ref 80.0–100.0)
MPV: 9.5 fL (ref 7.5–12.5)
Monocytes Relative: 11.6 %
Neutro Abs: 3907 cells/uL (ref 1500–7800)
Neutrophils Relative %: 59.2 %
Platelets: 288 10*3/uL (ref 140–400)
RBC: 4.2 10*6/uL (ref 3.80–5.10)
RDW: 12.6 % (ref 11.0–15.0)
Total Lymphocyte: 21.9 %
WBC: 6.6 10*3/uL (ref 3.8–10.8)

## 2020-09-13 MED ORDER — METHOTREXATE 2.5 MG PO TABS: 10.0000 mg | ORAL_TABLET | ORAL | 0 refills | Status: DC

## 2020-09-13 NOTE — Telephone Encounter (Signed)
-----   Message from Bo Merino, MD sent at 09/13/2020  8:27 AM EDT ----- CBC is normal.  AST is mildly elevated but is stable.  Please advise patient to reduce methotrexate to 4 tablets p.o. weekly as she was clinically doing well.

## 2020-09-13 NOTE — Progress Notes (Signed)
CBC is normal.  AST is mildly elevated but is stable.  Please advise patient to reduce methotrexate to 4 tablets p.o. weekly as she was clinically doing well.

## 2020-10-16 ENCOUNTER — Other Ambulatory Visit: Payer: Self-pay | Admitting: Physician Assistant

## 2020-10-16 NOTE — Telephone Encounter (Signed)
Last Visit: 08/02/2020 Next Visit: 01/03/2021 Labs: 09/12/2020 CBC is normal. AST is mildly elevated but is stable.  Current Dose per lab note on 09/12/2020: patient to reduce methotrexate to 4 tablets p.o. weekly  DX: Rheumatoid arthritis involving multiple sites with positive rheumatoid factor   Okay to refill MTX?

## 2020-10-24 ENCOUNTER — Ambulatory Visit (INDEPENDENT_AMBULATORY_CARE_PROVIDER_SITE_OTHER): Payer: Medicare Other

## 2020-10-24 DIAGNOSIS — I255 Ischemic cardiomyopathy: Secondary | ICD-10-CM | POA: Diagnosis not present

## 2020-10-24 LAB — CUP PACEART REMOTE DEVICE CHECK
Battery Remaining Longevity: 58 mo
Battery Voltage: 3 V
Brady Statistic AP VP Percent: 0.1 %
Brady Statistic AP VS Percent: 0.02 %
Brady Statistic AS VP Percent: 99.83 %
Brady Statistic AS VS Percent: 0.04 %
Brady Statistic RA Percent Paced: 0.13 %
Brady Statistic RV Percent Paced: 99.9 %
Date Time Interrogation Session: 20211207154308
Implantable Lead Implant Date: 20170818
Implantable Lead Implant Date: 20170818
Implantable Lead Implant Date: 20170818
Implantable Lead Location: 753858
Implantable Lead Location: 753859
Implantable Lead Location: 753860
Implantable Lead Model: 4396
Implantable Lead Model: 5076
Implantable Lead Model: 5076
Implantable Pulse Generator Implant Date: 20170818
Lead Channel Impedance Value: 342 Ohm
Lead Channel Impedance Value: 437 Ohm
Lead Channel Impedance Value: 475 Ohm
Lead Channel Impedance Value: 475 Ohm
Lead Channel Impedance Value: 513 Ohm
Lead Channel Impedance Value: 551 Ohm
Lead Channel Impedance Value: 589 Ohm
Lead Channel Impedance Value: 627 Ohm
Lead Channel Impedance Value: 893 Ohm
Lead Channel Pacing Threshold Amplitude: 0.5 V
Lead Channel Pacing Threshold Amplitude: 0.5 V
Lead Channel Pacing Threshold Amplitude: 0.5 V
Lead Channel Pacing Threshold Pulse Width: 0.4 ms
Lead Channel Pacing Threshold Pulse Width: 0.4 ms
Lead Channel Pacing Threshold Pulse Width: 0.4 ms
Lead Channel Sensing Intrinsic Amplitude: 1.75 mV
Lead Channel Sensing Intrinsic Amplitude: 1.75 mV
Lead Channel Sensing Intrinsic Amplitude: 3.75 mV
Lead Channel Sensing Intrinsic Amplitude: 7.5 mV
Lead Channel Setting Pacing Amplitude: 1.5 V
Lead Channel Setting Pacing Amplitude: 1.5 V
Lead Channel Setting Pacing Amplitude: 2 V
Lead Channel Setting Pacing Pulse Width: 0.4 ms
Lead Channel Setting Pacing Pulse Width: 0.4 ms
Lead Channel Setting Sensing Sensitivity: 0.9 mV

## 2020-11-01 ENCOUNTER — Other Ambulatory Visit: Payer: Self-pay | Admitting: *Deleted

## 2020-11-01 ENCOUNTER — Other Ambulatory Visit: Payer: Self-pay | Admitting: Rheumatology

## 2020-11-01 DIAGNOSIS — M0579 Rheumatoid arthritis with rheumatoid factor of multiple sites without organ or systems involvement: Secondary | ICD-10-CM

## 2020-11-01 MED ORDER — HYDROXYCHLOROQUINE SULFATE 200 MG PO TABS: ORAL_TABLET | ORAL | 0 refills | Status: DC

## 2020-11-01 NOTE — Telephone Encounter (Signed)
Last Visit:08/02/2020 Next Visit:01/03/2021 Labs: 09/12/2020 CBC is normal. AST is mildly elevated but is stable. PLQ Eye Exam:  08/16/2020 WNL   Current Dose per office note on 08/02/2020: Plaquenil 200 mg twice daily Monday through Friday only.  Dx: Rheumatoid arthritis involving multiple sites with positive rheumatoid factor   Okay to refill PLQ?

## 2020-11-01 NOTE — Telephone Encounter (Signed)
Last Visit: 08/02/2020 Next Visit: 01/03/2021  Okay to refill per Dr. Estanislado Pandy

## 2020-11-03 NOTE — Progress Notes (Signed)
Remote pacemaker transmission.   

## 2020-11-15 ENCOUNTER — Telehealth: Payer: Self-pay

## 2020-11-15 NOTE — Telephone Encounter (Signed)
Patient left a voicemail stating on 11/20/20 she is scheduled for surgery for a prolapsed rectum.  Patient states the surgeon wants her to stop her Methotrexate for 2 weeks following the surgery and wanted to let Dr. Corliss Skains know.

## 2020-11-18 HISTORY — PX: OTHER SURGICAL HISTORY: SHX169

## 2020-11-20 DIAGNOSIS — K219 Gastro-esophageal reflux disease without esophagitis: Secondary | ICD-10-CM | POA: Diagnosis not present

## 2020-11-20 DIAGNOSIS — I442 Atrioventricular block, complete: Secondary | ICD-10-CM | POA: Diagnosis not present

## 2020-11-20 DIAGNOSIS — M069 Rheumatoid arthritis, unspecified: Secondary | ICD-10-CM | POA: Diagnosis not present

## 2020-11-20 DIAGNOSIS — M47816 Spondylosis without myelopathy or radiculopathy, lumbar region: Secondary | ICD-10-CM | POA: Diagnosis not present

## 2020-11-20 DIAGNOSIS — I10 Essential (primary) hypertension: Secondary | ICD-10-CM | POA: Diagnosis not present

## 2020-11-20 DIAGNOSIS — J45909 Unspecified asthma, uncomplicated: Secondary | ICD-10-CM | POA: Diagnosis not present

## 2020-11-20 DIAGNOSIS — I2510 Atherosclerotic heart disease of native coronary artery without angina pectoris: Secondary | ICD-10-CM | POA: Diagnosis not present

## 2020-11-20 DIAGNOSIS — E785 Hyperlipidemia, unspecified: Secondary | ICD-10-CM | POA: Diagnosis not present

## 2020-11-20 DIAGNOSIS — M4802 Spinal stenosis, cervical region: Secondary | ICD-10-CM | POA: Diagnosis not present

## 2020-11-20 DIAGNOSIS — Z87891 Personal history of nicotine dependence: Secondary | ICD-10-CM | POA: Diagnosis not present

## 2020-11-20 DIAGNOSIS — N816 Rectocele: Secondary | ICD-10-CM | POA: Diagnosis not present

## 2020-11-27 ENCOUNTER — Other Ambulatory Visit: Payer: Self-pay

## 2020-11-27 MED ORDER — LOSARTAN POTASSIUM 50 MG PO TABS: 50.0000 mg | ORAL_TABLET | Freq: Every day | ORAL | 0 refills | Status: DC

## 2020-11-28 ENCOUNTER — Other Ambulatory Visit: Payer: Self-pay | Admitting: Interventional Cardiology

## 2020-12-22 NOTE — Progress Notes (Signed)
Office Visit Note  Patient: [name]             Date of Birth: 01/31/1946           MRN: [id number]             PCP: Maurice Small, MD Referring: Maurice Small, MD Visit Date: 01/05/2021 Occupation: @GUAROCC @  Subjective:  Other (Right 3rd digit pain, possible trigger finger. Limited ROM. )   History of Present Illness: [name] is a 75 y.o. female with a history of Rheumatoid Arthritis and OA. She takes Plaquenil and MTX which she has been tolerating well. She denies any side effects. She denies any episodes of joint pain or swelling with the medications. She rates her RA symptoms as being 1/10 today. She notes she has had some pain in her lower back with both movement and inactivity. She has not taken tylenol or advil for the pain. She denies fatigue, mouth sores, nasal sores, or LAD. She recently had surgery which she was unable to do regular activity for about 6 weeks. She is now starting to walk again.   She notes she issues with her third finger of her right hand which has been persistent since her last visit. She describes her finger getting stuck in a flexed position. She has significant pain with extension rated as a 10/10 at times. This is worsened pain since her last visit. These symptoms have been debilitating, noting she has marked difficulty with starting your car. She notes this has happened in the past with her left 4th finger, which spontaneously resolved.   Activities of Daily Living:  Patient reports morning stiffness for 0 minutes.   Patient Reports nocturnal pain.  Difficulty dressing/grooming: Denies Difficulty climbing stairs: Denies Difficulty getting out of chair: Denies Difficulty using hands for taps, buttons, cutlery, and/or writing: Reports  Review of Systems  Constitutional: Negative for fatigue.  HENT: Positive for mouth dryness and nose dryness. Negative for mouth sores.   Eyes: Positive for dryness. Negative for pain and itching.   Respiratory: Positive for shortness of breath. Negative for difficulty breathing.        On exertion   Cardiovascular: Negative for chest pain and palpitations.  Gastrointestinal: Negative for blood in stool, constipation and diarrhea.  Endocrine: Negative for increased urination.  Genitourinary: Negative for difficulty urinating.  Musculoskeletal: Negative for arthralgias, joint pain, joint swelling, myalgias, morning stiffness, muscle tenderness and myalgias.  Skin: Negative for color change, rash and redness.  Allergic/Immunologic: Negative for susceptible to infections.  Neurological: Positive for numbness. Negative for dizziness, headaches, memory loss and weakness.  Hematological: Positive for bruising/bleeding tendency.  Psychiatric/Behavioral: Negative for confusion.    PMFS History:  Patient Active Problem List   Diagnosis Date Noted  . Bilateral carpal tunnel syndrome 04/14/2018  . Right arm pain 01/07/2018  . Cervical spinal stenosis 01/07/2018  . Cervical radiculopathy 01/07/2018  . Stenosis of left subclavian artery ([place]) 12/15/2017  . History of asthma 06/25/2017  . History of gastroesophageal reflux (GERD) 06/25/2017  . History of anxiety 06/25/2017  . History of bilateral carpal tunnel release 06/25/2017  . Former smoker 01/25/2017  . Rheumatoid arthritis involving multiple sites with positive rheumatoid factor ([place]) 01/17/2017  . High risk medication use 01/17/2017  . Primary osteoarthritis of both hands 01/17/2017  . Primary osteoarthritis of both feet 01/17/2017  . DJD (degenerative joint disease), cervical 01/17/2017  . Spondylosis of lumbar region without myelopathy or radiculopathy 01/17/2017  . Syncope  and collapse 07/07/2016  . Cardiomyopathy, ischemic 07/07/2016  . CAD- S/P LAD thrombectomy 07/04/16 07/07/2016  . Pacemaker-MDT BiV placed 07/05/16 07/07/2016  . History of rheumatoid arthritis 07/07/2016  . Complete heart block ([place]) 07/04/2016  .  Essential hypertension 07/04/2016  . LBBB (left bundle branch block) 07/04/2016  . Ventricular tachycardia ([place]) 07/04/2016  . Faintness 07/04/2016  . NSTEMI (non-ST elevated myocardial infarction) ([place]) 07/04/2016  . Exostosis 06/18/2011  . History of breast cancer 02/16/2010  . Vitamin D deficiency 02/15/2010  . HYPERCHOLESTEROLEMIA 02/15/2010  . ANXIETY 02/15/2010  . Asthma 02/15/2010  . MIGRAINES, HX OF 02/15/2010  . MASTECTOMY, LEFT, HX OF 02/15/2010    Past Medical History:  Diagnosis Date  . Anxiety   . Asthma   . Breast cancer ([place])    left breast  . Carpal tunnel syndrome   . Depression   . Habitual alcohol use   . Heart attack ([place])   . Hypertension   . Personal history of chemotherapy   . Personal history of radiation therapy   . RA (rheumatoid arthritis) ([place])   . Spinal stenosis   . Vision abnormalities     Family History  Problem Relation Age of Onset  . Stroke Mother   . Cancer Father        colon?  . Pyelonephritis Sister   . Cancer Paternal Aunt   . Cancer Paternal Uncle    Past Surgical History:  Procedure Laterality Date  . BREAST BIOPSY    . BREAST LUMPECTOMY  2005   left  . CARDIAC CATHETERIZATION N/A 07/04/2016   Procedure: Left Heart Cath and Coronary Angiography;  Surgeon: Belva Crome, MD;  Location: [facility];  Service: Cardiovascular;  Laterality: N/A;  . CARDIAC CATHETERIZATION N/A 07/04/2016   Procedure: Temporary Pacemaker;  Surgeon: Belva Crome, MD;  Location: [facility];  Service: Cardiovascular;  Laterality: N/A;  . CARPAL TUNNEL RELEASE    . CATARACT EXTRACTION Bilateral   . EP IMPLANTABLE DEVICE N/A 07/05/2016   Procedure: Pacemaker Implant;  Surgeon: Evans Lance, MD;  Location: [facility];  Service: Cardiovascular;  Laterality: N/A;  . herniated rectum repair   11/2020  . LAMINECTOMY  1966  . LYMPHECTOMY  2005   Social History   Social History Narrative  . Not on file   Immunization History   Administered Date(s) Administered  . Influenza Whole 08/21/2009, 08/29/2010  . PFIZER(Purple Top)SARS-COV-2 Vaccination 01/14/2020, 02/08/2020, 08/08/2020  . Td 01/26/2018     Objective: Vital Signs: BP 126/69 (BP Location: Right Arm, Patient Position: Sitting, Cuff Size: Normal)   Pulse 85   Resp 13   Ht 5\' 2"  (1.575 m)   Wt 131 lb 6.4 oz (59.6 kg)   BMI 24.03 kg/m    Physical Exam Vitals and nursing note reviewed.  Constitutional:      Appearance: She is well-developed and well-nourished.  HENT:     Head: Normocephalic and atraumatic.  Eyes:     Extraocular Movements: EOM normal.     Conjunctiva/sclera: Conjunctivae normal.  Cardiovascular:     Rate and Rhythm: Normal rate and regular rhythm.     Pulses: Intact distal pulses.     Heart sounds: Normal heart sounds.  Pulmonary:     Effort: Pulmonary effort is normal.     Breath sounds: Normal breath sounds.  Abdominal:     General: Bowel sounds are normal.     Palpations: Abdomen is soft.  Musculoskeletal:  Cervical back: Normal range of motion.  Lymphadenopathy:     Cervical: No cervical adenopathy.  Skin:    General: Skin is warm and dry.     Capillary Refill: Capillary refill takes less than 2 seconds.  Neurological:     Mental Status: She is alert and oriented to person, place, and time.  Psychiatric:        Mood and Affect: Mood and affect normal.        Behavior: Behavior normal.      Musculoskeletal Exam:C-spine was in good range of motion.  Shoulder joints were in good range of motion.  She had right elbow joint contracture which is unchanged.  Left elbow joint was in good range of motion.  She has limited range of motion of bilateral wrist joints.  She has synovial thickening over MCP joints ulnar deviation with complete synovitis.  She has DIP and PIP thickening.  Hip joints and knee joints with good range of motion.  She had no tenderness over ankles or MTPs.  CDAI Exam: CDAI Score: 0.2  Patient  Global: 1 mm; Provider Global: 1 mm Swollen: 0 ; Tender: 0  Joint Exam 01/05/2021   No joint exam has been documented for this visit   There is currently no information documented on the homunculus. Go to the Rheumatology activity and complete the homunculus joint exam.  Investigation: No additional findings.  Imaging: US Guided Needle Placement  Result Date: 01/05/2021 Ultrasound guided injection is preferred based studies that show increased duration, increased effect, greater accuracy, decreased procedural pain, increased response rate, and decreased cost with ultrasound guided versus blind injection.   Verbal informed consent obtained.  Time-out conducted.  Noted no overlying erythema, induration, or other signs of local infection. Ultrasound-guided right middle trigger finger injection: after sterile prep with Betadine, injected 0.5 mL of 1% lidocaine and 10 mg Kenalog using a 27-gauge needle, passing the needle through the flexor tendon sheath.    Recent Labs: Lab Results  Component Value Date   WBC 7.1 12/26/2020   HGB 13.9 12/26/2020   PLT 298 12/26/2020   NA 140 12/26/2020   K 4.6 12/26/2020   CL 103 12/26/2020   CO2 30 12/26/2020   GLUCOSE 68 12/26/2020   BUN 11 12/26/2020   CREATININE 0.51 (L) 12/26/2020   BILITOT 0.4 12/26/2020   ALKPHOS 66 12/13/2017   AST 27 12/26/2020   ALT 18 12/26/2020   PROT 6.0 (L) 12/26/2020   ALBUMIN 3.5 12/13/2017   CALCIUM 9.0 12/26/2020   GFRAA 108 12/26/2020    Speciality Comments: PLQ Eye Exam: 08/16/2020 WNL @ [facility] Follow up in 1 year   Ultrasound guided injection is preferred based studies that show increased duration, increased effect, greater accuracy, decreased procedural pain, increased response rate, and decreased cost with ultrasound guided versus blind injection.   Verbal informed consent obtained.  Time-out conducted.  Noted no overlying erythema, induration, or other signs of local infection. Ultrasound-guided right middle  trigger finger injection: after sterile prep with Betadine, injected 0.5 mL of 1% lidocaine and 10 mg Kenalog using a 27-gauge needle, passing the needle through the flexor tendon sheath.   Procedures:  Hand/UE Inj: R long A1 for trigger finger on 01/05/2021 12:18 PM Indications: pain, tendon swelling and therapeutic Details: 27 G needle, ultrasound-guided volar approach Medications: 0.5 mL lidocaine 1 %; 10 mg triamcinolone acetonide 40 MG/ML Aspirate: 0 mL Procedure, treatment alternatives, risks and benefits explained, specific risks discussed. Immediately prior to procedure a  time out was called to verify the correct patient, procedure, equipment, support staff and site/side marked as required. Patient was prepped and draped in the usual sterile fashion.     Allergies: Betadine [povidone iodine] and Hydrocodone   Assessment / Plan:     Visit Diagnoses: Rheumatoid arthritis involving multiple sites with positive rheumatoid factor (HCC) - +RF, +anti-CCP, +ANA: Her rheumatoid arthritis appears to be well controlled on methotrexate and Plaquenil combination.  She had no synovitis on examination today.  High risk medication use - Methotrexate 4 tablets every 7 days, folic acid 1 mg daily, and Plaquenil 200 mg twice daily Monday through Friday only.  Labs from February 2022 showed CBC and CMP were within normal limits.  She will continue to get labs every 3 months.  Trigger finger, right middle finger -patient describes having severe pain and discomfort in her right middle finger.  Different treatment options and their side effects were discussed.  She wants to proceed with cortisone injection.  The procedure as described above. - Plan: US Guided Needle Placement.  Postprocedure instructions were given and a splint was applied.  She will use a splint for about 3 days.  Primary osteoarthritis of both hands-joint protection muscle strengthening was discussed.  Primary osteoarthritis of right knee -  She has moderate osteoarthritis and chondrocalcinosis evident on x-ray.  She continues to have some discomfort in her knee joints.  Primary osteoarthritis of both feet-she has overcrowding of the left toes.  Proper fitting shoes were discussed.  DDD (degenerative disc disease), cervical-she has fairly good range of motion of her cervical spine.  DDD (degenerative disc disease), lumbar-she has chronic discomfort in her lower back.  Osteoporosis screening - April 5, 2021DualFemur Neck Left 02/21/2020 76.0 -0.8 0.929 g/cm2.   Other medical problems are listed as follows:  History of ventricular tachycardia  History of vitamin D deficiency  History of hypertension  History of gastroesophageal reflux (GERD)  History of breast cancer  History of left bundle branch block  History of anxiety  History of asthma  History of hyperlipidemia  Former smoker  She has been fully vaccinated against COVID-19.  I advised her to get a fourth dose (booster) 6 months after the third dose.  Instructions were placed in the AVS.  Use of mask, social distancing and hand hygiene was discussed. Orders: Orders Placed This Encounter  Procedures  . Hand/UE Inj: L long A1  . US Guided Needle Placement   No orders of the defined types were placed in this encounter.    Follow-Up Instructions: Return in about 5 months (around 06/04/2021) for Rheumatoid arthritis.   Bo Merino, MD  Note - This record has been created using Editor, commissioning.  Chart creation errors have been sought, but may not always  have been located. Such creation errors do not reflect on  the standard of medical care.

## 2020-12-24 ENCOUNTER — Other Ambulatory Visit: Payer: Self-pay | Admitting: Interventional Cardiology

## 2020-12-25 ENCOUNTER — Other Ambulatory Visit: Payer: Self-pay

## 2020-12-25 MED ORDER — LOSARTAN POTASSIUM 50 MG PO TABS: 50.0000 mg | ORAL_TABLET | Freq: Every day | ORAL | 0 refills | Status: DC

## 2020-12-26 ENCOUNTER — Other Ambulatory Visit: Payer: Self-pay

## 2020-12-26 DIAGNOSIS — Z79899 Other long term (current) drug therapy: Secondary | ICD-10-CM | POA: Diagnosis not present

## 2020-12-27 LAB — CBC WITH DIFFERENTIAL/PLATELET
Absolute Monocytes: 916 cells/uL (ref 200–950)
Basophils Absolute: 71 cells/uL (ref 0–200)
Basophils Relative: 1 %
Eosinophils Absolute: 320 cells/uL (ref 15–500)
Eosinophils Relative: 4.5 %
HCT: 41 % (ref 35.0–45.0)
Hemoglobin: 13.9 g/dL (ref 11.7–15.5)
Lymphs Abs: 1519 cells/uL (ref 850–3900)
MCH: 32.2 pg (ref 27.0–33.0)
MCHC: 33.9 g/dL (ref 32.0–36.0)
MCV: 94.9 fL (ref 80.0–100.0)
MPV: 9.4 fL (ref 7.5–12.5)
Monocytes Relative: 12.9 %
Neutro Abs: 4274 cells/uL (ref 1500–7800)
Neutrophils Relative %: 60.2 %
Platelets: 298 10*3/uL (ref 140–400)
RBC: 4.32 10*6/uL (ref 3.80–5.10)
RDW: 12.4 % (ref 11.0–15.0)
Total Lymphocyte: 21.4 %
WBC: 7.1 10*3/uL (ref 3.8–10.8)

## 2020-12-27 LAB — COMPLETE METABOLIC PANEL WITH GFR
AG Ratio: 1.4 (calc) (ref 1.0–2.5)
ALT: 18 U/L (ref 6–29)
AST: 27 U/L (ref 10–35)
Albumin: 3.5 g/dL — ABNORMAL LOW (ref 3.6–5.1)
Alkaline phosphatase (APISO): 71 U/L (ref 37–153)
BUN/Creatinine Ratio: 22 (calc) (ref 6–22)
BUN: 11 mg/dL (ref 7–25)
CO2: 30 mmol/L (ref 20–32)
Calcium: 9 mg/dL (ref 8.6–10.4)
Chloride: 103 mmol/L (ref 98–110)
Creat: 0.51 mg/dL — ABNORMAL LOW (ref 0.60–0.93)
GFR, Est African American: 108 mL/min/{1.73_m2} (ref >=60)
GFR, Est Non African American: 93 mL/min/{1.73_m2} (ref >=60)
Globulin: 2.5 g/dL (calc) (ref 1.9–3.7)
Glucose, Bld: 68 mg/dL (ref 65–99)
Potassium: 4.6 mmol/L (ref 3.5–5.3)
Sodium: 140 mmol/L (ref 135–146)
Total Bilirubin: 0.4 mg/dL (ref 0.2–1.2)
Total Protein: 6 g/dL — ABNORMAL LOW (ref 6.1–8.1)

## 2020-12-29 ENCOUNTER — Other Ambulatory Visit: Payer: Self-pay | Admitting: Family Medicine

## 2020-12-29 DIAGNOSIS — Z1231 Encounter for screening mammogram for malignant neoplasm of breast: Secondary | ICD-10-CM

## 2021-01-03 ENCOUNTER — Ambulatory Visit: Payer: Medicare Other | Admitting: Rheumatology

## 2021-01-03 DIAGNOSIS — R8290 Unspecified abnormal findings in urine: Secondary | ICD-10-CM | POA: Diagnosis not present

## 2021-01-03 DIAGNOSIS — L929 Granulomatous disorder of the skin and subcutaneous tissue, unspecified: Secondary | ICD-10-CM | POA: Diagnosis not present

## 2021-01-05 ENCOUNTER — Other Ambulatory Visit: Payer: Self-pay

## 2021-01-05 ENCOUNTER — Ambulatory Visit: Payer: Self-pay

## 2021-01-05 ENCOUNTER — Ambulatory Visit: Payer: Medicare Other | Admitting: Rheumatology

## 2021-01-05 ENCOUNTER — Encounter: Payer: Self-pay | Admitting: Rheumatology

## 2021-01-05 VITALS — BP 126/69 | HR 85 | Resp 13 | Ht 62.0 in | Wt 131.4 lb

## 2021-01-05 DIAGNOSIS — Z853 Personal history of malignant neoplasm of breast: Secondary | ICD-10-CM

## 2021-01-05 DIAGNOSIS — Z8679 Personal history of other diseases of the circulatory system: Secondary | ICD-10-CM

## 2021-01-05 DIAGNOSIS — M5136 Other intervertebral disc degeneration, lumbar region: Secondary | ICD-10-CM

## 2021-01-05 DIAGNOSIS — M5030 Other cervical disc degeneration, unspecified cervical region: Secondary | ICD-10-CM | POA: Diagnosis not present

## 2021-01-05 DIAGNOSIS — M1711 Unilateral primary osteoarthritis, right knee: Secondary | ICD-10-CM

## 2021-01-05 DIAGNOSIS — M65331 Trigger finger, right middle finger: Secondary | ICD-10-CM

## 2021-01-05 DIAGNOSIS — M0579 Rheumatoid arthritis with rheumatoid factor of multiple sites without organ or systems involvement: Secondary | ICD-10-CM | POA: Diagnosis not present

## 2021-01-05 DIAGNOSIS — Z13820 Encounter for screening for osteoporosis: Secondary | ICD-10-CM

## 2021-01-05 DIAGNOSIS — Z8659 Personal history of other mental and behavioral disorders: Secondary | ICD-10-CM

## 2021-01-05 DIAGNOSIS — Z8709 Personal history of other diseases of the respiratory system: Secondary | ICD-10-CM

## 2021-01-05 DIAGNOSIS — Z8719 Personal history of other diseases of the digestive system: Secondary | ICD-10-CM | POA: Diagnosis not present

## 2021-01-05 DIAGNOSIS — Z79899 Other long term (current) drug therapy: Secondary | ICD-10-CM

## 2021-01-05 DIAGNOSIS — Z87891 Personal history of nicotine dependence: Secondary | ICD-10-CM

## 2021-01-05 DIAGNOSIS — Z8639 Personal history of other endocrine, nutritional and metabolic disease: Secondary | ICD-10-CM

## 2021-01-05 DIAGNOSIS — M19041 Primary osteoarthritis, right hand: Secondary | ICD-10-CM

## 2021-01-05 DIAGNOSIS — M19071 Primary osteoarthritis, right ankle and foot: Secondary | ICD-10-CM | POA: Diagnosis not present

## 2021-01-05 DIAGNOSIS — M19042 Primary osteoarthritis, left hand: Secondary | ICD-10-CM

## 2021-01-05 DIAGNOSIS — M19072 Primary osteoarthritis, left ankle and foot: Secondary | ICD-10-CM

## 2021-01-05 MED ORDER — LIDOCAINE HCL 1 % IJ SOLN
0.5000 mL | INTRAMUSCULAR | Status: AC | PRN
  Administered 2021-01-05: .5 mL

## 2021-01-05 MED ORDER — TRIAMCINOLONE ACETONIDE 40 MG/ML IJ SUSP
10.0000 mg | INTRAMUSCULAR | Status: AC | PRN
  Administered 2021-01-05: 10 mg

## 2021-01-05 NOTE — Patient Instructions (Signed)
Standing Labs We placed an order today for your standing lab work.   Please have your standing labs drawn in May and every 3 months  If possible, please have your labs drawn 2 weeks prior to your appointment so that the provider can discuss your results at your appointment.  We have open lab daily Monday through Thursday from 1:30-4:30 PM and Friday from 1:30-4:00 PM at the office of Dr. Bo Merino, [facility].   Please be advised, all patients with office appointments requiring lab work will take precedents over walk-in lab work.  If possible, please come for your lab work on Monday and Friday afternoons, as you may experience shorter wait times. The office is located at [address], [place], [place], [place] [address] No appointment is necessary.   Labs are drawn by [facility]. Please bring your co-pay at the time of your lab draw.  You may receive a bill from [facility] for your lab work.  If you wish to have your labs drawn at another location, please call the office 24 hours in advance to send orders.  If you have any questions regarding directions or hours of operation,  please call [phone].   As a reminder, please drink plenty of water prior to coming for your lab work. Thanks!  COVID-19 vaccine recommendations:   COVID-19 vaccine is recommended for everyone (unless you are allergic to a vaccine component), even if you are on a medication that suppresses your immune system.   If you are on Methotrexate, Cellcept (mycophenolate), Rinvoq, Morrie Sheldon, and Olumiant- hold the medication for 1 week after each vaccine. Hold Methotrexate for 2 weeks after the single dose COVID-19 vaccine.    It is recommended that individuals on immunosuppressive medications should get COVID-19 vaccination first 3 doses 1 month apart and then fourth dose 6 months from the third dose.  Do not take Tylenol or any anti-inflammatory medications (NSAIDs) 24 hours prior to the COVID-19  vaccination.   There is no direct evidence about the efficacy of the COVID-19 vaccine in individuals who are on medications that suppress the immune system.   Even if you are fully vaccinated, and you are on any medications that suppress your immune system, please continue to wear a mask, maintain at least six feet social distance and practice hand hygiene.   If you develop a COVID-19 infection, please contact your PCP or our office to determine if you need monoclonal antibody infusion.  The booster vaccine is now available for immunocompromised patients.   Please see the following web sites for updated information.   [email or web address]

## 2021-01-23 ENCOUNTER — Ambulatory Visit (INDEPENDENT_AMBULATORY_CARE_PROVIDER_SITE_OTHER): Payer: Medicare Other

## 2021-01-23 DIAGNOSIS — I442 Atrioventricular block, complete: Secondary | ICD-10-CM

## 2021-01-24 ENCOUNTER — Other Ambulatory Visit: Payer: Self-pay | Admitting: Physician Assistant

## 2021-01-24 DIAGNOSIS — M0579 Rheumatoid arthritis with rheumatoid factor of multiple sites without organ or systems involvement: Secondary | ICD-10-CM

## 2021-01-24 LAB — CUP PACEART REMOTE DEVICE CHECK
Battery Remaining Longevity: 60 mo
Battery Voltage: 3 V
Brady Statistic AP VP Percent: 0.13 %
Brady Statistic AP VS Percent: 0.02 %
Brady Statistic AS VP Percent: 99.79 %
Brady Statistic AS VS Percent: 0.06 %
Brady Statistic RA Percent Paced: 0.16 %
Brady Statistic RV Percent Paced: 99.87 %
Date Time Interrogation Session: 20220308215810
Implantable Lead Implant Date: 20170818
Implantable Lead Implant Date: 20170818
Implantable Lead Implant Date: 20170818
Implantable Lead Location: 753858
Implantable Lead Location: 753859
Implantable Lead Location: 753860
Implantable Lead Model: 4396
Implantable Lead Model: 5076
Implantable Lead Model: 5076
Implantable Pulse Generator Implant Date: 20170818
Lead Channel Impedance Value: 342 Ohm
Lead Channel Impedance Value: 437 Ohm
Lead Channel Impedance Value: 456 Ohm
Lead Channel Impedance Value: 513 Ohm
Lead Channel Impedance Value: 532 Ohm
Lead Channel Impedance Value: 532 Ohm
Lead Channel Impedance Value: 627 Ohm
Lead Channel Impedance Value: 646 Ohm
Lead Channel Impedance Value: 931 Ohm
Lead Channel Pacing Threshold Amplitude: 0.375 V
Lead Channel Pacing Threshold Amplitude: 0.5 V
Lead Channel Pacing Threshold Amplitude: 0.5 V
Lead Channel Pacing Threshold Pulse Width: 0.4 ms
Lead Channel Pacing Threshold Pulse Width: 0.4 ms
Lead Channel Pacing Threshold Pulse Width: 0.4 ms
Lead Channel Sensing Intrinsic Amplitude: 2.25 mV
Lead Channel Sensing Intrinsic Amplitude: 2.25 mV
Lead Channel Sensing Intrinsic Amplitude: 8.5 mV
Lead Channel Sensing Intrinsic Amplitude: 8.5 mV
Lead Channel Setting Pacing Amplitude: 1.5 V
Lead Channel Setting Pacing Amplitude: 1.5 V
Lead Channel Setting Pacing Amplitude: 2 V
Lead Channel Setting Pacing Pulse Width: 0.4 ms
Lead Channel Setting Pacing Pulse Width: 0.4 ms
Lead Channel Setting Sensing Sensitivity: 0.9 mV

## 2021-01-24 NOTE — Telephone Encounter (Signed)
Last Visit:01/05/2021 Next Visit: 06/08/2021 Labs: 12/26/2020, CBC WNL. Creatinine is borderline low-0.51. Total protein is borderline low. We will continue to monitor. No change in therapy at this time.  Eye exam: 08/16/2020  Current Dose per office note 01/05/2021, Plaquenil 200 mg twice daily Monday through Friday only. DX:  Rheumatoid arthritis involving multiple sites with positive rheumatoid factor   Last Fill: 11/01/2020  Okay to refill Plaquenil?

## 2021-01-25 ENCOUNTER — Other Ambulatory Visit: Payer: Self-pay | Admitting: Physician Assistant

## 2021-01-25 DIAGNOSIS — M0579 Rheumatoid arthritis with rheumatoid factor of multiple sites without organ or systems involvement: Secondary | ICD-10-CM

## 2021-01-29 DIAGNOSIS — H02002 Unspecified entropion of right lower eyelid: Secondary | ICD-10-CM | POA: Diagnosis not present

## 2021-01-30 ENCOUNTER — Other Ambulatory Visit: Payer: Self-pay | Admitting: Physician Assistant

## 2021-01-30 NOTE — Telephone Encounter (Signed)
Next Visit: 06/08/2021  Last Visit: 01/05/2021  Last Fill: 10/16/2020  DX: Rheumatoid arthritis involving multiple sites with positive rheumatoid factor   Current Dose per office note 01/05/2021, Methotrexate 4 tablets every 7 days,   Labs: 12/26/2020, CBC WNL. Creatinine is borderline low-0.51. Total protein is borderline low. We will continue to monitor. No change in therapy at this time.   Okay to refill MTX?

## 2021-02-01 NOTE — Progress Notes (Signed)
Remote pacemaker transmission.   

## 2021-02-06 ENCOUNTER — Other Ambulatory Visit: Payer: Self-pay | Admitting: Interventional Cardiology

## 2021-02-06 NOTE — Telephone Encounter (Signed)
Ok to give a 30 day supply with no refills with note to make an appt with Dr. Tamala Julian for future refills.  Last seen Tamala Julian in January 2021.

## 2021-02-19 ENCOUNTER — Ambulatory Visit
Admission: RE | Admit: 2021-02-19 | Discharge: 2021-02-19 | Disposition: A | Discharge status: Home / Self Care | Payer: Medicare Other | Source: Ambulatory Visit | Attending: Family Medicine | Admitting: Family Medicine

## 2021-02-19 ENCOUNTER — Other Ambulatory Visit: Payer: Self-pay

## 2021-02-19 DIAGNOSIS — Z1231 Encounter for screening mammogram for malignant neoplasm of breast: Secondary | ICD-10-CM

## 2021-02-22 DIAGNOSIS — E785 Hyperlipidemia, unspecified: Secondary | ICD-10-CM | POA: Diagnosis not present

## 2021-02-22 DIAGNOSIS — I10 Essential (primary) hypertension: Secondary | ICD-10-CM | POA: Diagnosis not present

## 2021-02-22 DIAGNOSIS — Z0000 Encounter for general adult medical examination without abnormal findings: Secondary | ICD-10-CM | POA: Diagnosis not present

## 2021-02-22 DIAGNOSIS — R5383 Other fatigue: Secondary | ICD-10-CM | POA: Diagnosis not present

## 2021-02-22 DIAGNOSIS — R7303 Prediabetes: Secondary | ICD-10-CM | POA: Diagnosis not present

## 2021-02-22 DIAGNOSIS — E611 Iron deficiency: Secondary | ICD-10-CM | POA: Diagnosis not present

## 2021-03-09 ENCOUNTER — Other Ambulatory Visit: Payer: Self-pay | Admitting: Interventional Cardiology

## 2021-03-09 MED ORDER — PANTOPRAZOLE SODIUM 40 MG PO TBEC: DELAYED_RELEASE_TABLET | ORAL | 0 refills | Status: DC

## 2021-03-09 NOTE — Addendum Note (Signed)
Addended by: Carter Kitten D on: 03/09/2021 01:21 PM   Modules accepted: Orders

## 2021-04-02 ENCOUNTER — Other Ambulatory Visit: Payer: Self-pay | Admitting: *Deleted

## 2021-04-02 MED ORDER — LOSARTAN POTASSIUM 50 MG PO TABS: 50.0000 mg | ORAL_TABLET | Freq: Every day | ORAL | 1 refills | Status: DC

## 2021-04-19 ENCOUNTER — Telehealth: Payer: Self-pay | Admitting: Rheumatology

## 2021-04-19 DIAGNOSIS — Z79899 Other long term (current) drug therapy: Secondary | ICD-10-CM

## 2021-04-19 NOTE — Telephone Encounter (Signed)
Patient going for lab draw tomorrow at [facility]. Please release orders.

## 2021-04-19 NOTE — Telephone Encounter (Signed)
Lab Orders released.  

## 2021-04-20 DIAGNOSIS — Z79899 Other long term (current) drug therapy: Secondary | ICD-10-CM | POA: Diagnosis not present

## 2021-04-21 LAB — CMP14+EGFR
ALT: 23 IU/L (ref 0–32)
AST: 37 IU/L (ref 0–40)
Albumin/Globulin Ratio: 1.7 (ref 1.2–2.2)
Albumin: 3.9 g/dL (ref 3.7–4.7)
Alkaline Phosphatase: 81 IU/L (ref 44–121)
BUN/Creatinine Ratio: 21 (ref 12–28)
BUN: 14 mg/dL (ref 8–27)
Bilirubin Total: 0.5 mg/dL (ref 0.0–1.2)
CO2: 26 mmol/L (ref 20–29)
Calcium: 9.1 mg/dL (ref 8.7–10.3)
Chloride: 100 mmol/L (ref 96–106)
Creatinine, Ser: 0.66 mg/dL (ref 0.57–1.00)
Globulin, Total: 2.3 g/dL (ref 1.5–4.5)
Glucose: 107 mg/dL — ABNORMAL HIGH (ref 65–99)
Potassium: 4.7 mmol/L (ref 3.5–5.2)
Sodium: 141 mmol/L (ref 134–144)
Total Protein: 6.2 g/dL (ref 6.0–8.5)
eGFR: 90 mL/min/{1.73_m2} (ref >59)

## 2021-04-21 LAB — CBC WITH DIFFERENTIAL/PLATELET
Basophils Absolute: 0.1 10*3/uL (ref 0.0–0.2)
Basos: 1 %
EOS (ABSOLUTE): 0.4 10*3/uL (ref 0.0–0.4)
Eos: 6 %
Hematocrit: 42.6 % (ref 34.0–46.6)
Hemoglobin: 13.9 g/dL (ref 11.1–15.9)
Immature Grans (Abs): 0 10*3/uL (ref 0.0–0.1)
Immature Granulocytes: 0 %
Lymphocytes Absolute: 1.6 10*3/uL (ref 0.7–3.1)
Lymphs: 22 %
MCH: 31 pg (ref 26.6–33.0)
MCHC: 32.6 g/dL (ref 31.5–35.7)
MCV: 95 fL (ref 79–97)
Monocytes Absolute: 0.7 10*3/uL (ref 0.1–0.9)
Monocytes: 10 %
Neutrophils Absolute: 4.3 10*3/uL (ref 1.4–7.0)
Neutrophils: 61 %
Platelets: 278 10*3/uL (ref 150–450)
RBC: 4.48 x10E6/uL (ref 3.77–5.28)
RDW: 13.2 % (ref 11.7–15.4)
WBC: 7.1 10*3/uL (ref 3.4–10.8)

## 2021-04-24 ENCOUNTER — Ambulatory Visit (INDEPENDENT_AMBULATORY_CARE_PROVIDER_SITE_OTHER): Payer: Medicare Other

## 2021-04-24 DIAGNOSIS — I442 Atrioventricular block, complete: Secondary | ICD-10-CM

## 2021-04-25 LAB — CUP PACEART REMOTE DEVICE CHECK
Battery Remaining Longevity: 53 mo
Battery Voltage: 2.99 V
Brady Statistic AP VP Percent: 0.12 %
Brady Statistic AP VS Percent: 0.02 %
Brady Statistic AS VP Percent: 99.82 %
Brady Statistic AS VS Percent: 0.04 %
Brady Statistic RA Percent Paced: 0.14 %
Brady Statistic RV Percent Paced: 99.92 %
Date Time Interrogation Session: 20220608060940
Implantable Lead Implant Date: 20170818
Implantable Lead Implant Date: 20170818
Implantable Lead Implant Date: 20170818
Implantable Lead Location: 753858
Implantable Lead Location: 753859
Implantable Lead Location: 753860
Implantable Lead Model: 4396
Implantable Lead Model: 5076
Implantable Lead Model: 5076
Implantable Pulse Generator Implant Date: 20170818
Lead Channel Impedance Value: 361 Ohm
Lead Channel Impedance Value: 456 Ohm
Lead Channel Impedance Value: 475 Ohm
Lead Channel Impedance Value: 532 Ohm
Lead Channel Impedance Value: 532 Ohm
Lead Channel Impedance Value: 551 Ohm
Lead Channel Impedance Value: 646 Ohm
Lead Channel Impedance Value: 665 Ohm
Lead Channel Impedance Value: 931 Ohm
Lead Channel Pacing Threshold Amplitude: 0.375 V
Lead Channel Pacing Threshold Amplitude: 0.5 V
Lead Channel Pacing Threshold Amplitude: 0.5 V
Lead Channel Pacing Threshold Pulse Width: 0.4 ms
Lead Channel Pacing Threshold Pulse Width: 0.4 ms
Lead Channel Pacing Threshold Pulse Width: 0.4 ms
Lead Channel Sensing Intrinsic Amplitude: 1 mV
Lead Channel Sensing Intrinsic Amplitude: 1 mV
Lead Channel Sensing Intrinsic Amplitude: 8.25 mV
Lead Channel Sensing Intrinsic Amplitude: 8.25 mV
Lead Channel Setting Pacing Amplitude: 1.5 V
Lead Channel Setting Pacing Amplitude: 1.5 V
Lead Channel Setting Pacing Amplitude: 2 V
Lead Channel Setting Pacing Pulse Width: 0.4 ms
Lead Channel Setting Pacing Pulse Width: 0.4 ms
Lead Channel Setting Sensing Sensitivity: 0.9 mV

## 2021-04-30 ENCOUNTER — Other Ambulatory Visit: Payer: Self-pay | Admitting: Rheumatology

## 2021-04-30 DIAGNOSIS — M0579 Rheumatoid arthritis with rheumatoid factor of multiple sites without organ or systems involvement: Secondary | ICD-10-CM

## 2021-04-30 NOTE — Telephone Encounter (Signed)
Next Visit: 06/08/2021   Last Visit: 01/05/2021   Last Fill: 01/24/2021  DX: Rheumatoid arthritis involving multiple sites with positive rheumatoid factor    Current Dose per office note 01/05/2021, Plaquenil 200 mg twice daily Monday through Friday only  Labs: 04/20/2021 Glucose is 107. Rest of CMP WNL.  CBC WNL.   PLQ Eye Exam: 08/16/2020 WNL  Okay to refill per Dr. Estanislado Pandy

## 2021-05-01 ENCOUNTER — Ambulatory Visit: Payer: Medicare Other | Admitting: Physician Assistant

## 2021-05-02 ENCOUNTER — Ambulatory Visit: Payer: Medicare Other | Admitting: Physician Assistant

## 2021-05-16 NOTE — Progress Notes (Signed)
Remote pacemaker transmission.   

## 2021-05-23 NOTE — Progress Notes (Signed)
Cardiology Office Note   Date:  05/24/2021   ID:  [name], DOB 1946/07/07, MRN [id number]  PCP:  Glenis Smoker, MD  Cardiologist:  Dr. Tamala Julian EP  Dr. Lovena Le     Chief Complaint  Patient presents with   Coronary Artery Disease      History of Present Illness: [name] is a 75 y.o. female who presents for CAD eval.  Last seen 11/2019 by DR. Smith and Dr. Lovena Le 10/21  She has a hx of history of breast Ca, HTN, RA, depression and diagnosed CAD s/p thrombotic occlusion of the LAD s/p thrombectomy (07/04/16), VT and CHB s/p BiV PPM . LVEF 55% by echo 2018.   Last visit no angina. Losartan was increased.  Stable device exam in Oct.  echo last one 2018 ef 45-50%  mild MR RV systolic function mod reduced.   Today she feels well, no chest pain and no SOB, she is active with aerobics twice a week and walks twice a week.  She eats healthy.  Her BP is up , she is concerned and brought in list and BP 782N to 562Z systolic.  Agree with her PCP to add hctz to meds.  Will recheck BMP in 2 weeks.  Her last one was in June and K+ 4.7, labs reviewed.  She does report occ lt leg swelling, none today.   Last pacer check 04/25/21 and was normal.  No changes.    She did have prolapsed rectal repair in Jan without issues.done at [facility].    Past Medical History:  Diagnosis Date   Anxiety    Asthma    Breast cancer ([place])    left breast   Carpal tunnel syndrome    Depression    Habitual alcohol use    Heart attack ([place])    Hypertension    Personal history of chemotherapy    Personal history of radiation therapy    RA (rheumatoid arthritis) ([place])    Spinal stenosis    Vision abnormalities     Past Surgical History:  Procedure Laterality Date   BREAST BIOPSY     BREAST LUMPECTOMY  2005   left   CARDIAC CATHETERIZATION N/A 07/04/2016   Procedure: Left Heart Cath and Coronary Angiography;  Surgeon: Belva Crome, MD;  Location: [facility];  Service:  Cardiovascular;  Laterality: N/A;   CARDIAC CATHETERIZATION N/A 07/04/2016   Procedure: Temporary Pacemaker;  Surgeon: Belva Crome, MD;  Location: [facility];  Service: Cardiovascular;  Laterality: N/A;   CARPAL TUNNEL RELEASE     CATARACT EXTRACTION Bilateral    EP IMPLANTABLE DEVICE N/A 07/05/2016   Procedure: Pacemaker Implant;  Surgeon: Evans Lance, MD;  Location: [facility];  Service: Cardiovascular;  Laterality: N/A;   herniated rectum repair   11/2020   LAMINECTOMY  1966   LYMPHECTOMY  2005     Current Outpatient Medications  Medication Sig Dispense Refill   albuterol (PROVENTIL HFA;VENTOLIN HFA) 108 (90 Base) MCG/ACT inhaler Inhale into the lungs every 6 (six) hours as needed for wheezing or shortness of breath.     aspirin 81 MG tablet Take 81 mg by mouth daily.     atorvastatin (LIPITOR) 40 MG tablet TAKE 1 TABLET(40 MG) BY MOUTH DAILY AT 6 PM 90 tablet 2   budesonide-formoterol (SYMBICORT) 160-4.5 MCG/ACT inhaler Inhale 2 puffs into the lungs 2 (two) times daily.     calcium carbonate (OS-CAL) 600  MG TABS Take 600 mg by mouth 2 (two) times daily with a meal.     citalopram (CELEXA) 20 MG tablet Take 20 mg by mouth daily.  3   clonazePAM (KLONOPIN) 0.5 MG tablet Take 0.5 mg by mouth every 12 (twelve) hours as needed for anxiety.  0   dicyclomine (BENTYL) 20 MG tablet TK 1 T PO TID B MEALS PRN     Ergocalciferol (VITAMIN D2) 2000 units TABS Take 2,000 Units by mouth daily.     folic acid (FOLVITE) 1 MG tablet TAKE 1 TABLET BY MOUTH DAILY 90 tablet 3   hydroxychloroquine (PLAQUENIL) 200 MG tablet TAKE 1 TABLET BY MOUTH TWICE DAILY MONDAY THROUGH FRIDAY ONLY 120 tablet 0   losartan-hydrochlorothiazide (HYZAAR) 50-12.5 MG tablet Take 1 tablet by mouth daily. 90 tablet 2   methotrexate (RHEUMATREX) 2.5 MG tablet TAKE 4 TABLETS(10 MG) BY MOUTH 1 TIME A WEEK 48 tablet 0   pantoprazole (PROTONIX) 40 MG tablet TAKE 1 TABLET(40 MG) BY MOUTH DAILY. Please keep upcoming  appt in June 2022 with Cardiologist before anymore refills. Thank you 90 tablet 0   traZODone (DESYREL) 50 MG tablet Take 50 mg by mouth daily as needed.     nitroGLYCERIN (NITROSTAT) 0.4 MG SL tablet Place 1 tablet (0.4 mg total) under the tongue every 5 (five) minutes as needed for chest pain. 25 tablet 2   No current facility-administered medications for this visit.    Allergies:   Betadine [povidone iodine] and Hydrocodone    Social History:  The patient  reports that she quit smoking about 27 years ago. Her smoking use included cigarettes. She has a 45.00 pack-year smoking history. She has never used smokeless tobacco. She reports current alcohol use of about 14.0 standard drinks of alcohol per week. She reports that she does not use drugs.   Family History:  The patient's family history includes Cancer in her father, paternal aunt, and paternal uncle; Pyelonephritis in her sister; Stroke in her mother.    ROS:  General:no colds or fevers, no weight changes Skin:no rashes or ulcers HEENT:no blurred vision, no congestion CV:see HPI PUL:see HPI GI:no diarrhea constipation or melena, no indigestion GU:no hematuria, no dysuria MS:no joint pain, no claudication Neuro:no syncope, no lightheadedness Endo:no diabetes, no thyroid disease  Wt Readings from Last 3 Encounters:  05/24/21 131 lb 6.4 oz (59.6 kg)  01/05/21 131 lb 6.4 oz (59.6 kg)  09/12/20 130 lb (59 kg)     PHYSICAL EXAM: VS:  BP (!) 142/70   Pulse 88   Ht 5\' 2"  (1.575 m)   Wt 131 lb 6.4 oz (59.6 kg)   SpO2 94%   BMI 24.03 kg/m  , BMI Body mass index is 24.03 kg/m. General:Pleasant affect, NAD Skin:Warm and dry, brisk capillary refill HEENT:normocephalic, sclera clear, mucus membranes moist Neck:supple, no JVD, no bruits  Heart:S1S2 RRR without murmur, gallup, rub or click Lungs:clear without rales, rhonchi, or wheezes NTZ:GYFV, non tender, + BS, do not palpate liver spleen or masses Ext:no lower ext edema, 2+  pedal pulses, 2+ radial pulses Neuro:alert and oriented, MAE, follows commands, + facial symmetry    EKG:  EKG is NOT ordered today.   Recent Labs: 04/20/2021: ALT 23; BUN 14; Creatinine, Ser 0.66; Hemoglobin 13.9; Platelets 278; Potassium 4.7; Sodium 141    Lipid Panel    Component Value Date/Time   CHOL 166 07/05/2016 0313   TRIG 88 07/05/2016 0313   HDL 54 07/05/2016 0313   CHOLHDL  3.1 07/05/2016 0313   VLDL 18 07/05/2016 0313   LDLCALC 94 07/05/2016 0313       Other studies Reviewed: Additional studies/ records that were reviewed today include:  Echo 04/25/17  . Study Conclusions   - Left ventricle: The cavity size was normal. Wall thickness was    normal. Systolic function was normal. The estimated ejection    fraction was in the range of 55% to 60%. Wall motion was normal;    there were no regional wall motion abnormalities. Doppler    parameters are consistent with abnormal left ventricular    relaxation (grade 1 diastolic dysfunction).  - Mitral valve: Moderately calcified annulus.  - Left atrium: The atrium was mildly dilated.  Aortic valve:   Structurally normal valve.   Cusp separation was  normal.  Doppler:  Transvalvular velocity was within the normal  range. There was no stenosis. There was no regurgitation.   -------------------------------------------------------------------  Aorta:  Aortic root: The aortic root was normal in size.  Ascending aorta: The ascending aorta was normal in size.   -------------------------------------------------------------------  Mitral valve:   Moderately calcified annulus.  Doppler:  There was  trivial regurgitation.    Peak gradient (D): 3 mm Hg.   -------------------------------------------------------------------  Left atrium:  The atrium was mildly dilated.   -------------------------------------------------------------------  Right ventricle:  The cavity size was normal. Pacer wire or  catheter noted in right  ventricle. Systolic function was normal.     -------------------------------------------------------------------  Pulmonic valve:    The valve appears to be grossly normal.  Doppler:  There was trivial regurgitation.   -------------------------------------------------------------------  Tricuspid valve:   The valve appears to be grossly normal.  Doppler:  There was no significant regurgitation.   -------------------------------------------------------------------  Right atrium:  The atrium was normal in size. Pacer wire or  catheter noted in right atrium.   -------------------------------------------------------------------  Pericardium:  There was no pericardial effusion.  ASSESSMENT AND PLAN:  1.  CAD with hx thrombotic occlusion of LAD with thrombectomy 2017, VT and CHB resulting in BiV PPM followed by Dr. Lovena Le and is stable.  No angina and she is active and does aerobics.  She will follow in 1 year with Dr. Tamala Julian.  2.  Systolic chronic HF/ICM  though improved EF and euvolemic today, on occ has lower ext edema.  Adding low dose HCTZ to meds.  We discussed an echo but pt is asymptomatic so will hold off for now.  3.  HTN elevated with add 12.5 mg HCTZ to losartan.  Recheck BMP in 2 weeks and she will call us in BP in 1 week.    4. LBBB chronic  5. HLD followed by PCP and excellent on review 02/22/21 with LDL 54 and HDL 68, labs reviewed.   6.  Lt subclavian stenosis no complaints of Lt arm fatigue.  May consider dopplers on next visit.    Current medicines are reviewed with the patient today.  The patient Has no concerns regarding medicines.  The following changes have been made:  See above Labs/ tests ordered today include:see above  Disposition:   FU:  see above  Signed, Cecilie Kicks, NP  05/24/2021 11:10 AM    [facility] [place], [place], [place], [place] Phone: [phone]; Fax: [phone]

## 2021-05-24 ENCOUNTER — Ambulatory Visit: Payer: Medicare Other | Admitting: Cardiology

## 2021-05-24 ENCOUNTER — Encounter: Payer: Self-pay | Admitting: Cardiology

## 2021-05-24 ENCOUNTER — Other Ambulatory Visit: Payer: Self-pay

## 2021-05-24 VITALS — BP 142/70 | HR 88 | Ht 62.0 in | Wt 131.4 lb

## 2021-05-24 DIAGNOSIS — I2510 Atherosclerotic heart disease of native coronary artery without angina pectoris: Secondary | ICD-10-CM

## 2021-05-24 DIAGNOSIS — E7800 Pure hypercholesterolemia, unspecified: Secondary | ICD-10-CM

## 2021-05-24 DIAGNOSIS — I447 Left bundle-branch block, unspecified: Secondary | ICD-10-CM

## 2021-05-24 DIAGNOSIS — I10 Essential (primary) hypertension: Secondary | ICD-10-CM

## 2021-05-24 DIAGNOSIS — Z9861 Coronary angioplasty status: Secondary | ICD-10-CM | POA: Diagnosis not present

## 2021-05-24 DIAGNOSIS — I255 Ischemic cardiomyopathy: Secondary | ICD-10-CM | POA: Diagnosis not present

## 2021-05-24 DIAGNOSIS — I5020 Unspecified systolic (congestive) heart failure: Secondary | ICD-10-CM | POA: Diagnosis not present

## 2021-05-24 MED ORDER — LOSARTAN POTASSIUM-HCTZ 50-12.5 MG PO TABS: 1.0000 | ORAL_TABLET | Freq: Every day | ORAL | 2 refills | Status: DC

## 2021-05-24 MED ORDER — NITROGLYCERIN 0.4 MG SL SUBL: 0.4000 mg | SUBLINGUAL_TABLET | SUBLINGUAL | 2 refills | Status: DC | PRN

## 2021-05-24 NOTE — Patient Instructions (Addendum)
Medication Instructions:  Your physician has recommended you make the following change in your medication:   STOP Losartan  START Losartan-HCTZ 50-12.5 mg taking 1 tablet a day.  Please call us or send in a mychart message in 1 week with your blood pressure reading.     *If you need a refill on your cardiac medications before your next appointment, please call your pharmacy*   Lab Work: 2 WEEKS:  06/07/2021 COME ANYTIME AFTER 7:30 AND BEFORE 4:30 FOR:  BMET  If you have labs (blood work) drawn today and your tests are completely normal, you will receive your results only by: [place] (if you have MyChart) OR A paper copy in the mail If you have any lab test that is abnormal or we need to change your treatment, we will call you to review the results.   Testing/Procedures: None ordered   Follow-Up: At [facility], you and your health needs are our priority.  As part of our continuing mission to provide you with exceptional heart care, we have created designated Provider Care Teams.  These Care Teams include your primary Cardiologist (physician) and Advanced Practice Providers (APPs -  Physician Assistants and Nurse Practitioners) who all work together to provide you with the care you need, when you need it.  We recommend signing up for the patient portal called "MyChart".  Sign up information is provided on this After Visit Summary.  MyChart is used to connect with patients for Virtual Visits (Telemedicine).  Patients are able to view lab/test results, encounter notes, upcoming appointments, etc.  Non-urgent messages can be sent to your provider as well.   To learn more about what you can do with MyChart, go to [email or web address].    Your next appointment:   9 month(s)  The format for your next appointment:   In Person  Provider:   You may see Sinclair Grooms, MD or one of the following Advanced Practice Providers on your designated Care Team:   Kathyrn Drown,  NP   Other Instructions

## 2021-05-25 NOTE — Progress Notes (Signed)
Office Visit Note  Patient: [name]             Date of Birth: [birth year]           MRN: [id number]             PCP: Glenis Smoker, MD Referring: Maurice Small, MD Visit Date: 06/08/2021 Occupation: @GUAROCC @  Subjective:  Medication management  History of Present Illness: [name] is a 75 y.o. female with a history of rheumatoid arthritis and osteoarthritis.  She states she had severe right shoulder joint pain about 2 weeks ago.  She went to the emergency room on July 12 where she was evaluated.  She had x-ray of her shoulder which was consistent with osteoarthritis.  She states after few days the shoulder joint pain improved.  She has not had a flare of rheumatoid arthritis.  She has been taking methotrexate and Plaquenil on a regular basis.  She denies any discomfort in her neck or lower back currently.  Activities of Daily Living:  Patient reports morning stiffness for 0 minutes.   Patient Denies nocturnal pain.  Difficulty dressing/grooming: Denies Difficulty climbing stairs: Denies Difficulty getting out of chair: Denies Difficulty using hands for taps, buttons, cutlery, and/or writing: Reports  Review of Systems  Constitutional:  Negative for fatigue, night sweats, weight gain and weight loss.  HENT:  Negative for mouth sores, trouble swallowing, trouble swallowing, mouth dryness and nose dryness.   Eyes:  Positive for itching and dryness. Negative for pain, redness and visual disturbance.  Respiratory:  Positive for shortness of breath. Negative for cough, hemoptysis and difficulty breathing.   Cardiovascular:  Positive for swelling in legs/feet. Negative for chest pain, palpitations, hypertension and irregular heartbeat.  Gastrointestinal:  Negative for abdominal pain, blood in stool, constipation and diarrhea.  Endocrine: Negative for increased urination.  Genitourinary:  Negative for painful urination and vaginal dryness.  Musculoskeletal:   Positive for joint pain, joint pain, myalgias, muscle weakness, muscle tenderness and myalgias. Negative for joint swelling and morning stiffness.  Skin:  Negative for color change, rash, hair loss, redness, skin tightness, ulcers and sensitivity to sunlight.  Allergic/Immunologic: Negative for susceptible to infections.  Neurological:  Positive for numbness. Negative for dizziness, headaches, memory loss, night sweats and weakness.  Hematological:  Negative for swollen glands.  Psychiatric/Behavioral:  Negative for depressed mood, confusion and sleep disturbance. The patient is not nervous/anxious.    PMFS History:  Patient Active Problem List   Diagnosis Date Noted   Bilateral carpal tunnel syndrome 04/14/2018   Right arm pain 01/07/2018   Cervical spinal stenosis 01/07/2018   Cervical radiculopathy 01/07/2018   Stenosis of left subclavian artery ([place]) 12/15/2017   History of asthma 06/25/2017   History of gastroesophageal reflux (GERD) 06/25/2017   History of anxiety 06/25/2017   History of bilateral carpal tunnel release 06/25/2017   Former smoker 01/25/2017   Rheumatoid arthritis involving multiple sites with positive rheumatoid factor ([place]) 01/17/2017   High risk medication use 01/17/2017   Primary osteoarthritis of both hands 01/17/2017   Primary osteoarthritis of both feet 01/17/2017   DJD (degenerative joint disease), cervical 01/17/2017   Spondylosis of lumbar region without myelopathy or radiculopathy 01/17/2017   Syncope and collapse 07/07/2016   Cardiomyopathy, ischemic 07/07/2016   CAD- S/P LAD thrombectomy 07/04/16 07/07/2016   Pacemaker-MDT BiV placed 07/05/16 07/07/2016   History of rheumatoid arthritis 07/07/2016   Complete heart block ([place]) 07/04/2016   Essential hypertension 07/04/2016  LBBB (left bundle branch block) 07/04/2016   Ventricular tachycardia ([place]) 07/04/2016   Faintness 07/04/2016   NSTEMI (non-ST elevated myocardial infarction) ([place]) 07/04/2016    Exostosis 06/18/2011   History of breast cancer 02/16/2010   Vitamin D deficiency 02/15/2010   HYPERCHOLESTEROLEMIA 02/15/2010   ANXIETY 02/15/2010   Asthma 02/15/2010   MIGRAINES, HX OF 02/15/2010   MASTECTOMY, LEFT, HX OF 02/15/2010    Past Medical History:  Diagnosis Date   Anxiety    Asthma    Breast cancer ([place])    left breast   Carpal tunnel syndrome    Depression    Habitual alcohol use    Heart attack ([place])    Hypertension    Personal history of chemotherapy    Personal history of radiation therapy    RA (rheumatoid arthritis) ([place])    Spinal stenosis    Vision abnormalities     Family History  Problem Relation Age of Onset   Stroke Mother    Cancer Father        colon?   Pyelonephritis Sister    Cancer Paternal Aunt    Cancer Paternal Uncle    Past Surgical History:  Procedure Laterality Date   BREAST BIOPSY     BREAST LUMPECTOMY  2005   left   CARDIAC CATHETERIZATION N/A 07/04/2016   Procedure: Left Heart Cath and Coronary Angiography;  Surgeon: Belva Crome, MD;  Location: [facility];  Service: Cardiovascular;  Laterality: N/A;   CARDIAC CATHETERIZATION N/A 07/04/2016   Procedure: Temporary Pacemaker;  Surgeon: Belva Crome, MD;  Location: [facility];  Service: Cardiovascular;  Laterality: N/A;   CARPAL TUNNEL RELEASE     CATARACT EXTRACTION Bilateral    EP IMPLANTABLE DEVICE N/A 07/05/2016   Procedure: Pacemaker Implant;  Surgeon: Evans Lance, MD;  Location: [facility];  Service: Cardiovascular;  Laterality: N/A;   herniated rectum repair   11/2020   LAMINECTOMY  1966   LYMPHECTOMY  2005   Social History   Social History Narrative   Not on file   Immunization History  Administered Date(s) Administered   Influenza Whole 08/21/2009, 08/29/2010   PFIZER(Purple Top)SARS-COV-2 Vaccination 01/14/2020, 02/08/2020, 08/08/2020   Td 01/26/2018     Objective: Vital Signs: BP 126/70 (BP Location: Right Arm, Patient Position:  Sitting, Cuff Size: Normal)   Pulse 76   Ht 5\' 2"  (1.575 m)   Wt 133 lb 3.2 oz (60.4 kg)   BMI 24.36 kg/m    Physical Exam Vitals and nursing note reviewed.  Constitutional:      Appearance: She is well-developed.  HENT:     Head: Normocephalic and atraumatic.  Eyes:     Conjunctiva/sclera: Conjunctivae normal.  Cardiovascular:     Rate and Rhythm: Normal rate and regular rhythm.     Heart sounds: Normal heart sounds.  Pulmonary:     Effort: Pulmonary effort is normal.     Breath sounds: Normal breath sounds.  Abdominal:     General: Bowel sounds are normal.     Palpations: Abdomen is soft.  Musculoskeletal:     Cervical back: Normal range of motion.  Lymphadenopathy:     Cervical: No cervical adenopathy.  Skin:    General: Skin is warm and dry.     Capillary Refill: Capillary refill takes less than 2 seconds.  Neurological:     Mental Status: She is alert and oriented to person, place, and time.  Psychiatric:  Behavior: Behavior normal.     Musculoskeletal Exam: She had limited range of motion of her cervical spine.  She had painful limited range of motion of lumbar spine  Shoulder joints were in good range of motion without discomfort.  She has right elbow joint contracture.  Wrist joints with good range of motion.  She has synovial thickening and ulnar deviation of her MCPs joint.  PIP and DIP thickening was noted.  Hip joints with good range of motion.  Knee joints and ankle joints with good range of motion without any synovitis.  There was no tenderness over MTPs.  CDAI Exam: CDAI Score: 0.2  Patient Global: 1 mm; Provider Global: 1 mm Swollen: 0 ; Tender: 0  Joint Exam 06/08/2021   No joint exam has been documented for this visit   There is currently no information documented on the homunculus. Go to the Rheumatology activity and complete the homunculus joint exam.  Investigation: No additional findings.  Imaging: DG Shoulder Right  Result Date:  05/29/2021 CLINICAL DATA:  Pain, decreased range of motion. EXAM: RIGHT SHOULDER - 2+ VIEW COMPARISON:  None. FINDINGS: Degenerative changes in the [facility] joint with joint space narrowing and spurring. Glenohumeral joint is maintained. No acute bony abnormality. Specifically, no fracture, subluxation, or dislocation. Soft tissues are intact. IMPRESSION: Moderate degenerative changes in the right AC joint. No acute bony abnormality. Electronically Signed   By: Rolm Baptise M.D.   On: 05/29/2021 10:51    Recent Labs: Lab Results  Component Value Date   WBC 7.1 04/20/2021   HGB 13.9 04/20/2021   PLT 278 04/20/2021   NA 140 06/07/2021   K 4.5 06/07/2021   CL 98 06/07/2021   CO2 28 06/07/2021   GLUCOSE 67 06/07/2021   BUN 13 06/07/2021   CREATININE 0.60 06/07/2021   BILITOT 0.5 04/20/2021   ALKPHOS 81 04/20/2021   AST 37 04/20/2021   ALT 23 04/20/2021   PROT 6.2 04/20/2021   ALBUMIN 3.9 04/20/2021   CALCIUM 9.5 06/07/2021   GFRAA 108 12/26/2020    Speciality Comments: PLQ Eye Exam: 08/16/2020 WNL @ [facility] Follow up in 1 year  Procedures:  No procedures performed Allergies: Betadine [povidone iodine] and Hydrocodone   Assessment / Plan:     Visit Diagnoses: Rheumatoid arthritis involving multiple sites with positive rheumatoid factor ([place]) - +RF, +anti-CCP, +ANA: She had no synovitis on my examination today.  High risk medication use - Methotrexate 4 tablets every 7 days, folic acid 1 mg daily, and Plaquenil 200 mg twice daily Monday through Friday only.  Labs are stable.  She has been advised to get labs every 3 months to monitor for drug toxicity.  She is advised to stop methotrexate in case she develops an infection and resume after the infection resolves.  Instructions regarding immunization was also placed in the AVS.  Arthritis of right acromioclavicular joint-she recently developed increased pain and discomfort in her right shoulder joint.  She was seen in the emergency room where she  had x-ray of her right shoulder joint.  I reviewed the x-ray which was consistent with acromioclavicular arthritis.  She has off-and-on discomfort.  I have given her a handout on exercises.  If her symptoms get worse she may come in for cortisone injection.  Contracture of right elbow-old with no synovitis.  Primary osteoarthritis of both hands-she has bilateral MCP thickening and ulnar deviation.  She also has bilateral PIP and DIP thickening consistent with rheumatoid arthritis and osteoarthritis overlap.  Primary osteoarthritis of right knee-she denies any discomfort.  Primary osteoarthritis of both feet-she denies discomfort.  DDD (degenerative disc disease), cervical-she has limited range of motion of the cervical spine.  DDD (degenerative disc disease), lumbar-she has chronic pain.  Osteoporosis screening - April 5, 2021DualFemur Neck Left 02/21/2020 76.0 -0.8 0.929 g/cm2.  History of vitamin D deficiency-she is on vitamin D supplement.  Other medical problems are listed as follows:  History of ventricular tachycardia  History of gastroesophageal reflux (GERD)  History of anxiety  History of asthma  History of left bundle branch block  History of breast cancer  History of hypertension  History of hyperlipidemia-increased risk of heart disease with autoimmune disease was discussed.  Information regarding dietary modifications and exercises were placed in the AVS.  Former smoker  Orders: No orders of the defined types were placed in this encounter.  No orders of the defined types were placed in this encounter.    Follow-Up Instructions: Return in about 5 months (around 11/08/2021) for Rheumatoid arthritis.   Bo Merino, MD  Note - This record has been created using Editor, commissioning.  Chart creation errors have been sought, but may not always  have been located. Such creation errors do not reflect on  the standard of medical care.

## 2021-05-29 ENCOUNTER — Emergency Department (HOSPITAL_BASED_OUTPATIENT_CLINIC_OR_DEPARTMENT_OTHER)
Admission: EM | Admit: 2021-05-29 | Discharge: 2021-05-29 | Disposition: A | Discharge status: Home / Self Care | Payer: Medicare Other | Attending: Emergency Medicine | Admitting: Emergency Medicine

## 2021-05-29 ENCOUNTER — Telehealth: Payer: Self-pay | Admitting: Rheumatology

## 2021-05-29 ENCOUNTER — Emergency Department (HOSPITAL_BASED_OUTPATIENT_CLINIC_OR_DEPARTMENT_OTHER): Payer: Medicare Other | Admitting: Radiology

## 2021-05-29 ENCOUNTER — Other Ambulatory Visit: Payer: Self-pay

## 2021-05-29 ENCOUNTER — Encounter (HOSPITAL_BASED_OUTPATIENT_CLINIC_OR_DEPARTMENT_OTHER): Payer: Self-pay | Admitting: Emergency Medicine

## 2021-05-29 DIAGNOSIS — M19011 Primary osteoarthritis, right shoulder: Secondary | ICD-10-CM | POA: Diagnosis not present

## 2021-05-29 DIAGNOSIS — Z5321 Procedure and treatment not carried out due to patient leaving prior to being seen by health care provider: Secondary | ICD-10-CM | POA: Diagnosis not present

## 2021-05-29 DIAGNOSIS — M25511 Pain in right shoulder: Secondary | ICD-10-CM | POA: Insufficient documentation

## 2021-05-29 MED ORDER — OXYCODONE-ACETAMINOPHEN 5-325 MG PO TABS: 1.0000 | ORAL_TABLET | Freq: Four times a day (QID) | ORAL | 0 refills | Status: DC | PRN

## 2021-05-29 MED ORDER — SENNOSIDES-DOCUSATE SODIUM 8.6-50 MG PO TABS: 1.0000 | ORAL_TABLET | Freq: Every evening | ORAL | 0 refills | Status: DC | PRN

## 2021-05-29 NOTE — Telephone Encounter (Signed)
Patient states she began having pain pain in her right shoulder and upper part of right arm. Patient states she has not had a fall or an injury. Patient has been seen in ER and had x-rays. No break. Patient states she has taken Tylenol for the pain and states she was also given a prescription for pain medication by the emergency room. Patient states she was also advised to use icy hot. Patient states she would like to know if this may be her RA or OA. Patient states she is unable to move her arm because it hurts so much. Patient is taking PLQ and MTX as prescribed. Patient would like to know if you have any other suggestions. Please advise.

## 2021-05-29 NOTE — ED Provider Notes (Signed)
Emergency Department Provider Note   I have reviewed the triage vital signs and the nursing notes.   HISTORY  Chief Complaint Shoulder Pain   HPI [name] is a 75 y.o. female presents to the ED with right shoulder pain starting last night. Pain began while brushing teeth. No falls or obvious injury. No fever, swelling, or prior similar pain. ROM is limited. Pain is moderate to severe and worse with movement. No CP or SOB.    Past Medical History:  Diagnosis Date   Anxiety    Asthma    Breast cancer ([place])    left breast   Carpal tunnel syndrome    Depression    Habitual alcohol use    Heart attack ([place])    Hypertension    Personal history of chemotherapy    Personal history of radiation therapy    RA (rheumatoid arthritis) ([place])    Spinal stenosis    Vision abnormalities     Patient Active Problem List   Diagnosis Date Noted   Bilateral carpal tunnel syndrome 04/14/2018   Right arm pain 01/07/2018   Cervical spinal stenosis 01/07/2018   Cervical radiculopathy 01/07/2018   Stenosis of left subclavian artery ([place]) 12/15/2017   History of asthma 06/25/2017   History of gastroesophageal reflux (GERD) 06/25/2017   History of anxiety 06/25/2017   History of bilateral carpal tunnel release 06/25/2017   Former smoker 01/25/2017   Rheumatoid arthritis involving multiple sites with positive rheumatoid factor ([place]) 01/17/2017   High risk medication use 01/17/2017   Primary osteoarthritis of both hands 01/17/2017   Primary osteoarthritis of both feet 01/17/2017   DJD (degenerative joint disease), cervical 01/17/2017   Spondylosis of lumbar region without myelopathy or radiculopathy 01/17/2017   Syncope and collapse 07/07/2016   Cardiomyopathy, ischemic 07/07/2016   CAD- S/P LAD thrombectomy 07/04/16 07/07/2016   Pacemaker-MDT BiV placed 07/05/16 07/07/2016   History of rheumatoid arthritis 07/07/2016   Complete heart block ([place]) 07/04/2016   Essential  hypertension 07/04/2016   LBBB (left bundle branch block) 07/04/2016   Ventricular tachycardia ([place]) 07/04/2016   Faintness 07/04/2016   NSTEMI (non-ST elevated myocardial infarction) ([place]) 07/04/2016   Exostosis 06/18/2011   History of breast cancer 02/16/2010   Vitamin D deficiency 02/15/2010   HYPERCHOLESTEROLEMIA 02/15/2010   ANXIETY 02/15/2010   Asthma 02/15/2010   MIGRAINES, HX OF 02/15/2010   MASTECTOMY, LEFT, HX OF 02/15/2010    Past Surgical History:  Procedure Laterality Date   BREAST BIOPSY     BREAST LUMPECTOMY  2005   left   CARDIAC CATHETERIZATION N/A 07/04/2016   Procedure: Left Heart Cath and Coronary Angiography;  Surgeon: Belva Crome, MD;  Location: [facility];  Service: Cardiovascular;  Laterality: N/A;   CARDIAC CATHETERIZATION N/A 07/04/2016   Procedure: Temporary Pacemaker;  Surgeon: Belva Crome, MD;  Location: [facility];  Service: Cardiovascular;  Laterality: N/A;   CARPAL TUNNEL RELEASE     CATARACT EXTRACTION Bilateral    EP IMPLANTABLE DEVICE N/A 07/05/2016   Procedure: Pacemaker Implant;  Surgeon: Evans Lance, MD;  Location: [facility];  Service: Cardiovascular;  Laterality: N/A;   herniated rectum repair   11/2020   LAMINECTOMY  1966   LYMPHECTOMY  2005    Allergies Betadine [povidone iodine] and Hydrocodone  Family History  Problem Relation Age of Onset   Stroke Mother    Cancer Father        colon?  Pyelonephritis Sister    Cancer Paternal Aunt    Cancer Paternal Uncle     Social History Social History   Tobacco Use   Smoking status: Former    Packs/day: 1.50    Years: 30.00    Pack years: 45.00    Types: Cigarettes    Quit date: 1995    Years since quitting: 27.5   Smokeless tobacco: Never  Vaping Use   Vaping Use: Never used  Substance Use Topics   Alcohol use: Yes    Alcohol/week: 14.0 standard drinks    Types: 14 Cans of beer per week    Comment: 2 DAILY   Drug use: Never    Review of  Systems  Constitutional: No fever/chills Eyes: No visual changes. ENT: No sore throat. Cardiovascular: Denies chest pain. Respiratory: Denies shortness of breath. Gastrointestinal: No abdominal pain.  No nausea, no vomiting.  No diarrhea.  No constipation. Genitourinary: Negative for dysuria. Musculoskeletal: Negative for back pain. Positive right shoulder pain.  Skin: Negative for rash. Neurological: Negative for headaches, focal weakness or numbness.  10-point ROS otherwise negative.  ____________________________________________   PHYSICAL EXAM:  VITAL SIGNS: ED Triage Vitals  Enc Vitals Group     BP 05/29/21 1031 123/79     Pulse Rate 05/29/21 1031 82     Resp 05/29/21 1031 16     Temp 05/29/21 1031 98.5 F (36.9 C)     Temp Source 05/29/21 1031 Oral     SpO2 05/29/21 1031 97 %   Constitutional: Alert and oriented. Well appearing and in no acute distress. Eyes: Conjunctivae are normal.  Head: Atraumatic. Nose: No congestion/rhinnorhea. Mouth/Throat: Mucous membranes are moist.  Neck: No stridor.  Cardiovascular: Normal rate, regular rhythm. Good peripheral circulation. Grossly normal heart sounds.   Respiratory: Normal respiratory effort.  No retractions. Lungs CTAB. Gastrointestinal: Soft and nontender. No distention.  Musculoskeletal: No lower extremity tenderness nor edema. No gross deformities of extremities. Lateral right shoulder pain with tenderness to palpation and limited ROM. No joint redness/warmth.  Neurologic:  Normal speech and language. No gross focal neurologic deficits are appreciated.  Skin:  Skin is warm, dry and intact. No rash noted.  ____________________________________________  RADIOLOGY  DG right shoulder reviewed.   ____________________________________________   PROCEDURES  Procedure(s) performed:   Procedures  None ____________________________________________   INITIAL IMPRESSION / ASSESSMENT AND PLAN / ED COURSE  Pertinent  labs & imaging results that were available during my care of the patient were reviewed by me and considered in my medical decision making (see chart for details).   Plain films reviewed. No fracture or dislocation. Doubt atypical ACS. Plan for supportive care meds and ortho referral for follow up. ? Rotator cuff injury/pain.    ____________________________________________  FINAL CLINICAL IMPRESSION(S) / ED DIAGNOSES  Final diagnoses:  Acute pain of right shoulder    NEW OUTPATIENT MEDICATIONS STARTED DURING THIS VISIT:  Discharge Medication List as of 05/29/2021 11:21 AM     START taking these medications   Details  oxyCODONE-acetaminophen (PERCOCET/ROXICET) 5-325 MG tablet Take 1 tablet by mouth every 6 (six) hours as needed for severe pain., Starting Tue 05/29/2021, Normal    senna-docusate (SENOKOT-S) 8.6-50 MG tablet Take 1 tablet by mouth at bedtime as needed for mild constipation or moderate constipation., Starting Tue 05/29/2021, Normal        Note:  This document was prepared using Dragon voice recognition software and may include unintentional dictation errors.  Nanda Quinton, MD, [facility] Emergency Medicine  Margette Fast, MD 06/04/21 2201

## 2021-05-29 NOTE — ED Notes (Signed)
Patient verbalizes understanding of discharge instructions. Opportunity for questioning and answers were provided. Patient discharged from ED.  °

## 2021-05-29 NOTE — Telephone Encounter (Signed)
Patient advised per Dr. Estanislado Pandy, she would benefit from a cortisone injection.  Patient scheduled for an appointment on 05/31/2021.

## 2021-05-29 NOTE — Discharge Instructions (Addendum)
You were seen in the emergency department today with shoulder pain.  Your x-ray shows bad arthritis which may be causing your symptoms.  Please discuss with your primary care doctor as well as your rheumatologist.  I have listed the name for an orthopedic surgeon as well who can further evaluate.  I am sending you home with Percocet which is a strong pain medication.  It can cause constipation as well as lightheadedness.  Do not take it at the same time as your clonazepam.  Needs to be spaced by at least 3 to 4 hours.

## 2021-05-29 NOTE — ED Triage Notes (Signed)
Pt arrives to ED with c/o of right shoulder pain since last night. Pt reports the pain started suddenly while brushing her teeth last night. No falls or injury. The pain is constant and sharp. Pt with decreased ROM.

## 2021-05-29 NOTE — Telephone Encounter (Signed)
Patient calling in reference to right shoulder pain that started last night. Patient did not have an injury to shoulder. Pain in shoulder, shoulder blade area, and down bicep. Patient went to ER, and nothing was broken. Patient was told it was due to arthritis. Please call to advise.

## 2021-05-29 NOTE — Telephone Encounter (Signed)
She would benefit from a cortisone injection.  Please a schedule next available appointment.

## 2021-05-30 NOTE — Progress Notes (Deleted)
Office Visit Note  Patient: [name]             Date of Birth: 1946/02/28           MRN: [id number]             PCP: Glenis Smoker, MD Referring: Glenis Smoker, * Visit Date: 05/31/2021 Occupation: @GUAROCC @  Subjective:  No chief complaint on file.   History of Present Illness: [name] is a 75 y.o. female ***   Activities of Daily Living:  Patient reports morning stiffness for *** {minute/hour:19697}.   Patient {ACTIONS;DENIES/REPORTS:21021675::"Denies"} nocturnal pain.  Difficulty dressing/grooming: {ACTIONS;DENIES/REPORTS:21021675::"Denies"} Difficulty climbing stairs: {ACTIONS;DENIES/REPORTS:21021675::"Denies"} Difficulty getting out of chair: {ACTIONS;DENIES/REPORTS:21021675::"Denies"} Difficulty using hands for taps, buttons, cutlery, and/or writing: {ACTIONS;DENIES/REPORTS:21021675::"Denies"}  No Rheumatology ROS completed.   PMFS History:  Patient Active Problem List   Diagnosis Date Noted   Bilateral carpal tunnel syndrome 04/14/2018   Right arm pain 01/07/2018   Cervical spinal stenosis 01/07/2018   Cervical radiculopathy 01/07/2018   Stenosis of left subclavian artery ([place]) 12/15/2017   History of asthma 06/25/2017   History of gastroesophageal reflux (GERD) 06/25/2017   History of anxiety 06/25/2017   History of bilateral carpal tunnel release 06/25/2017   Former smoker 01/25/2017   Rheumatoid arthritis involving multiple sites with positive rheumatoid factor ([place]) 01/17/2017   High risk medication use 01/17/2017   Primary osteoarthritis of both hands 01/17/2017   Primary osteoarthritis of both feet 01/17/2017   DJD (degenerative joint disease), cervical 01/17/2017   Spondylosis of lumbar region without myelopathy or radiculopathy 01/17/2017   Syncope and collapse 07/07/2016   Cardiomyopathy, ischemic 07/07/2016   CAD- S/P LAD thrombectomy 07/04/16 07/07/2016   Pacemaker-MDT BiV placed 07/05/16 07/07/2016   History of  rheumatoid arthritis 07/07/2016   Complete heart block ([place]) 07/04/2016   Essential hypertension 07/04/2016   LBBB (left bundle branch block) 07/04/2016   Ventricular tachycardia ([place]) 07/04/2016   Faintness 07/04/2016   NSTEMI (non-ST elevated myocardial infarction) ([place]) 07/04/2016   Exostosis 06/18/2011   History of breast cancer 02/16/2010   Vitamin D deficiency 02/15/2010   HYPERCHOLESTEROLEMIA 02/15/2010   ANXIETY 02/15/2010   Asthma 02/15/2010   MIGRAINES, HX OF 02/15/2010   MASTECTOMY, LEFT, HX OF 02/15/2010    Past Medical History:  Diagnosis Date   Anxiety    Asthma    Breast cancer ([place])    left breast   Carpal tunnel syndrome    Depression    Habitual alcohol use    Heart attack ([place])    Hypertension    Personal history of chemotherapy    Personal history of radiation therapy    RA (rheumatoid arthritis) ([place])    Spinal stenosis    Vision abnormalities     Family History  Problem Relation Age of Onset   Stroke Mother    Cancer Father        colon?   Pyelonephritis Sister    Cancer Paternal Aunt    Cancer Paternal Uncle    Past Surgical History:  Procedure Laterality Date   BREAST BIOPSY     BREAST LUMPECTOMY  2005   left   CARDIAC CATHETERIZATION N/A 07/04/2016   Procedure: Left Heart Cath and Coronary Angiography;  Surgeon: Belva Crome, MD;  Location: [facility];  Service: Cardiovascular;  Laterality: N/A;   CARDIAC CATHETERIZATION N/A 07/04/2016   Procedure: Temporary Pacemaker;  Surgeon: Belva Crome, MD;  Location: [facility];  Service: Cardiovascular;  Laterality: N/A;   CARPAL TUNNEL RELEASE     CATARACT EXTRACTION Bilateral    EP IMPLANTABLE DEVICE N/A 07/05/2016   Procedure: Pacemaker Implant;  Surgeon: Evans Lance, MD;  Location: [facility];  Service: Cardiovascular;  Laterality: N/A;   herniated rectum repair   11/2020   LAMINECTOMY  1966   LYMPHECTOMY  2005   Social History   Social History Narrative   Not  on file   Immunization History  Administered Date(s) Administered   Influenza Whole 08/21/2009, 08/29/2010   PFIZER(Purple Top)SARS-COV-2 Vaccination 01/14/2020, 02/08/2020, 08/08/2020   Td 01/26/2018     Objective: Vital Signs: There were no vitals taken for this visit.   Physical Exam   Musculoskeletal Exam: ***  CDAI Exam: CDAI Score: -- Patient Global: --; Provider Global: -- Swollen: --; Tender: -- Joint Exam 05/31/2021   No joint exam has been documented for this visit   There is currently no information documented on the homunculus. Go to the Rheumatology activity and complete the homunculus joint exam.  Investigation: No additional findings.  Imaging: DG Shoulder Right  Result Date: 05/29/2021 CLINICAL DATA:  Pain, decreased range of motion. EXAM: RIGHT SHOULDER - 2+ VIEW COMPARISON:  None. FINDINGS: Degenerative changes in the [facility] joint with joint space narrowing and spurring. Glenohumeral joint is maintained. No acute bony abnormality. Specifically, no fracture, subluxation, or dislocation. Soft tissues are intact. IMPRESSION: Moderate degenerative changes in the right AC joint. No acute bony abnormality. Electronically Signed   By: Rolm Baptise M.D.   On: 05/29/2021 10:51    Recent Labs: Lab Results  Component Value Date   WBC 7.1 04/20/2021   HGB 13.9 04/20/2021   PLT 278 04/20/2021   NA 141 04/20/2021   K 4.7 04/20/2021   CL 100 04/20/2021   CO2 26 04/20/2021   GLUCOSE 107 (H) 04/20/2021   BUN 14 04/20/2021   CREATININE 0.66 04/20/2021   BILITOT 0.5 04/20/2021   ALKPHOS 81 04/20/2021   AST 37 04/20/2021   ALT 23 04/20/2021   PROT 6.2 04/20/2021   ALBUMIN 3.9 04/20/2021   CALCIUM 9.1 04/20/2021   GFRAA 108 12/26/2020    Speciality Comments: PLQ Eye Exam: 08/16/2020 WNL @ [facility] Follow up in 1 year  Procedures:  No procedures performed Allergies: Betadine [povidone iodine] and Hydrocodone   Assessment / Plan:     Visit Diagnoses: No diagnosis  found.  Orders: No orders of the defined types were placed in this encounter.  No orders of the defined types were placed in this encounter.   Face-to-face time spent with patient was *** minutes. Greater than 50% of time was spent in counseling and coordination of care.  Follow-Up Instructions: No follow-ups on file.   Earnestine Mealing, CMA  Note - This record has been created using Editor, commissioning.  Chart creation errors have been sought, but may not always  have been located. Such creation errors do not reflect on  the standard of medical care.

## 2021-05-31 ENCOUNTER — Ambulatory Visit: Payer: Medicare Other | Admitting: Physician Assistant

## 2021-06-07 ENCOUNTER — Other Ambulatory Visit: Payer: Medicare Other

## 2021-06-07 ENCOUNTER — Other Ambulatory Visit: Payer: Self-pay

## 2021-06-07 DIAGNOSIS — I10 Essential (primary) hypertension: Secondary | ICD-10-CM | POA: Diagnosis not present

## 2021-06-07 LAB — BASIC METABOLIC PANEL
BUN/Creatinine Ratio: 22 (ref 12–28)
BUN: 13 mg/dL (ref 8–27)
CO2: 28 mmol/L (ref 20–29)
Calcium: 9.5 mg/dL (ref 8.7–10.3)
Chloride: 98 mmol/L (ref 96–106)
Creatinine, Ser: 0.6 mg/dL (ref 0.57–1.00)
Glucose: 67 mg/dL (ref 65–99)
Potassium: 4.5 mmol/L (ref 3.5–5.2)
Sodium: 140 mmol/L (ref 134–144)
eGFR: 92 mL/min/{1.73_m2} (ref >59)

## 2021-06-08 ENCOUNTER — Encounter: Payer: Self-pay | Admitting: Rheumatology

## 2021-06-08 ENCOUNTER — Ambulatory Visit: Payer: Medicare Other | Admitting: Rheumatology

## 2021-06-08 VITALS — BP 126/70 | HR 76 | Ht 62.0 in | Wt 133.2 lb

## 2021-06-08 DIAGNOSIS — M5030 Other cervical disc degeneration, unspecified cervical region: Secondary | ICD-10-CM | POA: Diagnosis not present

## 2021-06-08 DIAGNOSIS — Z87891 Personal history of nicotine dependence: Secondary | ICD-10-CM

## 2021-06-08 DIAGNOSIS — M5136 Other intervertebral disc degeneration, lumbar region: Secondary | ICD-10-CM | POA: Diagnosis not present

## 2021-06-08 DIAGNOSIS — M19042 Primary osteoarthritis, left hand: Secondary | ICD-10-CM

## 2021-06-08 DIAGNOSIS — Z8659 Personal history of other mental and behavioral disorders: Secondary | ICD-10-CM

## 2021-06-08 DIAGNOSIS — M19011 Primary osteoarthritis, right shoulder: Secondary | ICD-10-CM

## 2021-06-08 DIAGNOSIS — M19041 Primary osteoarthritis, right hand: Secondary | ICD-10-CM

## 2021-06-08 DIAGNOSIS — Z8639 Personal history of other endocrine, nutritional and metabolic disease: Secondary | ICD-10-CM | POA: Diagnosis not present

## 2021-06-08 DIAGNOSIS — Z8679 Personal history of other diseases of the circulatory system: Secondary | ICD-10-CM

## 2021-06-08 DIAGNOSIS — M1711 Unilateral primary osteoarthritis, right knee: Secondary | ICD-10-CM | POA: Diagnosis not present

## 2021-06-08 DIAGNOSIS — M19071 Primary osteoarthritis, right ankle and foot: Secondary | ICD-10-CM

## 2021-06-08 DIAGNOSIS — Z79899 Other long term (current) drug therapy: Secondary | ICD-10-CM

## 2021-06-08 DIAGNOSIS — M19072 Primary osteoarthritis, left ankle and foot: Secondary | ICD-10-CM

## 2021-06-08 DIAGNOSIS — Z13820 Encounter for screening for osteoporosis: Secondary | ICD-10-CM

## 2021-06-08 DIAGNOSIS — M24521 Contracture, right elbow: Secondary | ICD-10-CM | POA: Diagnosis not present

## 2021-06-08 DIAGNOSIS — M0579 Rheumatoid arthritis with rheumatoid factor of multiple sites without organ or systems involvement: Secondary | ICD-10-CM

## 2021-06-08 DIAGNOSIS — M51369 Other intervertebral disc degeneration, lumbar region without mention of lumbar back pain or lower extremity pain: Secondary | ICD-10-CM

## 2021-06-08 DIAGNOSIS — M65331 Trigger finger, right middle finger: Secondary | ICD-10-CM

## 2021-06-08 DIAGNOSIS — Z8719 Personal history of other diseases of the digestive system: Secondary | ICD-10-CM

## 2021-06-08 DIAGNOSIS — Z8709 Personal history of other diseases of the respiratory system: Secondary | ICD-10-CM

## 2021-06-08 DIAGNOSIS — Z853 Personal history of malignant neoplasm of breast: Secondary | ICD-10-CM

## 2021-06-08 NOTE — Patient Instructions (Addendum)
Standing Labs We placed an order today for your standing lab work.   Please have your standing labs drawn in September and every 3 months  If possible, please have your labs drawn 2 weeks prior to your appointment so that the provider can discuss your results at your appointment.  Please note that you may see your imaging and lab results in [facility] before we have reviewed them. We may be awaiting multiple results to interpret others before contacting you. Please allow our office up to 72 hours to thoroughly review all of the results before contacting the office for clarification of your results.  We have open lab daily: Monday through Thursday from 1:30-4:30 PM and Friday from 1:30-4:00 PM at the office of Dr. Bo Merino, [facility].   Please be advised, all patients with office appointments requiring lab work will take precedent over walk-in lab work.  If possible, please come for your lab work on Monday and Friday afternoons, as you may experience shorter wait times. The office is located at [address] [place], [place], [place] [address] No appointment is necessary.   Labs are drawn by [facility]. Please bring your co-pay at the time of your lab draw.  You may receive a bill from [facility] for your lab work.  If you wish to have your labs drawn at another location, please call the office 24 hours in advance to send orders.  If you have any questions regarding directions or hours of operation,  please call [phone].   As a reminder, please drink plenty of water prior to coming for your lab work. Thanks!   Vaccines You are taking a medication(s) that can suppress your immune system.  The following immunizations are recommended: Flu annually Covid-19  Td/Tdap (tetanus, diphtheria, pertussis) every 10 years Pneumonia (Prevnar 15 then Pneumovax 23 at least 1 year apart.  Alternatively, can take Prevnar 20 without needing additional dose) Shingrix (after age 16): 2  doses from 4 weeks to 6 months apart  Please check with your PCP to make sure you are up to date.    If you test POSITIVE for COVID19 and have MILD to MODERATE symptoms: First, call your PCP if you would like to receive COVID19 treatment AND Hold your medications during the infection and for at least 1 week after your symptoms have resolved: Injectable medication (Benlysta, Cimzia, Cosentyx, Enbrel, Humira, Orencia, Remicade, Simponi, Stelara, Taltz, Tremfya) Methotrexate Leflunomide (Arava) Azathioprine Mycophenolate (Cellcept) Morrie Sheldon, Olumiant, or Rinvoq If you take Actemra or Kevzara, you DO NOT need to hold these for COVID19 infection.  If you test POSITIVE for COVID19 and have NO symptoms: First, call your PCP if you would like to receive COVID19 treatment AND Hold your medications for at least 10 days after the day that you tested positive Injectable medication (Benlysta, Cimzia, Cosentyx, Enbrel, Humira, Orencia, Remicade, Simponi, Stelara, Taltz, Tremfya) Methotrexate Leflunomide (Arava) Azathioprine Mycophenolate (Cellcept) Morrie Sheldon, Olumiant, or Rinvoq If you take Actemra or Kevzara, you DO NOT need to hold these for COVID19 infection.  If you have signs or symptoms of an infection or start antibiotics: First, call your PCP for workup of your infection. Hold your medication through the infection, until you complete your antibiotics, and until symptoms resolve if you take the following: Injectable medication (Actemra, Benlysta, Cimzia, Cosentyx, Enbrel, Humira, Kevzara, Orencia, Remicade, Simponi, Stelara, Taltz, Tremfya) Methotrexate Leflunomide (Arava) Mycophenolate (Cellcept) Roma Kayser, or Rinvoq  Heart Disease Prevention   Your inflammatory disease increases your risk of heart  disease which includes heart attack, stroke, atrial fibrillation (irregular heartbeats), high blood pressure, heart failure and atherosclerosis (plaque in the arteries).  It is important  to reduce your risk by:   Keep blood pressure, cholesterol, and blood sugar at healthy levels   Smoking Cessation   Maintain a healthy weight  BMI 20-25   Eat a healthy diet  Plenty of fresh fruit, vegetables, and whole grains  Limit saturated fats, foods high in sodium, and added sugars  DASH and Mediterranean diet   Increase physical activity  Recommend moderate physically activity for 150 minutes per week/ 30 minutes a day for five days a week These can be broken up into three separate ten-minute sessions during the day.   Reduce Stress  Meditation, slow breathing exercises, yoga, coloring books  Dental visits twice a year    Shoulder Exercises Ask your health care provider which exercises are safe for you. Do exercises exactly as told by your health care provider and adjust them as directed. It is normal to feel mild stretching, pulling, tightness, or discomfort as you do these exercises. Stop right away if you feel sudden pain or your pain gets worse. Do not begin these exercises until told by your health care provider. Stretching exercises External rotation and abduction This exercise is sometimes called corner stretch. This exercise rotates your arm outward (external rotation) and moves your arm out from your body (abduction). Stand in a doorway with one of your feet slightly in front of the other. This is called a staggered stance. If you cannot reach your forearms to the door frame, stand facing a corner of a room. Choose one of the following positions as told by your health care provider: Place your hands and forearms on the door frame above your head. Place your hands and forearms on the door frame at the height of your head. Place your hands on the door frame at the height of your elbows. Slowly move your weight onto your front foot until you feel a stretch across your chest and in the front of your shoulders. Keep your head and chest upright and keep your abdominal muscles  tight. Hold for __________ seconds. To release the stretch, shift your weight to your back foot. Repeat __________ times. Complete this exercise __________ times a day. Extension, standing Stand and hold a broomstick, a cane, or a similar object behind your back. Your hands should be a little wider than shoulder width apart. Your palms should face away from your back. Keeping your elbows straight and your shoulder muscles relaxed, move the stick away from your body until you feel a stretch in your shoulders (extension). Avoid shrugging your shoulders while you move the stick. Keep your shoulder blades tucked down toward the middle of your back. Hold for __________ seconds. Slowly return to the starting position. Repeat __________ times. Complete this exercise __________ times a day. Range-of-motion exercises Pendulum  Stand near a wall or a surface that you can hold onto for balance. Bend at the waist and let your left / right arm hang straight down. Use your other arm to support you. Keep your back straight and do not lock your knees. Relax your left / right arm and shoulder muscles, and move your hips and your trunk so your left / right arm swings freely. Your arm should swing because of the motion of your body, not because you are using your arm or shoulder muscles. Keep moving your hips and trunk so your arm  swings in the following directions, as told by your health care provider: Side to side. Forward and backward. In clockwise and counterclockwise circles. Continue each motion for __________ seconds, or for as long as told by your health care provider. Slowly return to the starting position. Repeat __________ times. Complete this exercise __________ times a day. Shoulder flexion, standing  Stand and hold a broomstick, a cane, or a similar object. Place your hands a little more than shoulder width apart on the object. Your left / right hand should be palm up, and your other hand  should be palm down. Keep your elbow straight and your shoulder muscles relaxed. Push the stick up with your healthy arm to raise your left / right arm in front of your body, and then over your head until you feel a stretch in your shoulder (flexion). Avoid shrugging your shoulder while you raise your arm. Keep your shoulder blade tucked down toward the middle of your back. Hold for __________ seconds. Slowly return to the starting position. Repeat __________ times. Complete this exercise __________ times a day. Shoulder abduction, standing Stand and hold a broomstick, a cane, or a similar object. Place your hands a little more than shoulder width apart on the object. Your left / right hand should be palm up, and your other hand should be palm down. Keep your elbow straight and your shoulder muscles relaxed. Push the object across your body toward your left / right side. Raise your left / right arm to the side of your body (abduction) until you feel a stretch in your shoulder. Do not raise your arm above shoulder height unless your health care provider tells you to do that. If directed, raise your arm over your head. Avoid shrugging your shoulder while you raise your arm. Keep your shoulder blade tucked down toward the middle of your back. Hold for __________ seconds. Slowly return to the starting position. Repeat __________ times. Complete this exercise __________ times a day. Internal rotation  Place your left / right hand behind your back, palm up. Use your other hand to dangle an exercise band, a towel, or a similar object over your shoulder. Grasp the band with your left / right hand so you are holding on to both ends. Gently pull up on the band until you feel a stretch in the front of your left / right shoulder. The movement of your arm toward the center of your body is called internal rotation. Avoid shrugging your shoulder while you raise your arm. Keep your shoulder blade tucked down  toward the middle of your back. Hold for __________ seconds. Release the stretch by letting go of the band and lowering your hands. Repeat __________ times. Complete this exercise __________ times a day. Strengthening exercises External rotation  Sit in a stable chair without armrests. Secure an exercise band to a stable object at elbow height on your left / right side. Place a soft object, such as a folded towel or a small pillow, between your left / right upper arm and your body to move your elbow about 4 inches (10 cm) away from your side. Hold the end of the exercise band so it is tight and there is no slack. Keeping your elbow pressed against the soft object, slowly move your forearm out, away from your abdomen (external rotation). Keep your body steady so only your forearm moves. Hold for __________ seconds. Slowly return to the starting position. Repeat __________ times. Complete this exercise __________ times a  day. Shoulder abduction  Sit in a stable chair without armrests, or stand up. Hold a __________ weight in your left / right hand, or hold an exercise band with both hands. Start with your arms straight down and your left / right palm facing in, toward your body. Slowly lift your left / right hand out to your side (abduction). Do not lift your hand above shoulder height unless your health care provider tells you that this is safe. Keep your arms straight. Avoid shrugging your shoulder while you do this movement. Keep your shoulder blade tucked down toward the middle of your back. Hold for __________ seconds. Slowly lower your arm, and return to the starting position. Repeat __________ times. Complete this exercise __________ times a day. Shoulder extension Sit in a stable chair without armrests, or stand up. Secure an exercise band to a stable object in front of you so it is at shoulder height. Hold one end of the exercise band in each hand. Your palms should face each  other. Straighten your elbows and lift your hands up to shoulder height. Step back, away from the secured end of the exercise band, until the band is tight and there is no slack. Squeeze your shoulder blades together as you pull your hands down to the sides of your thighs (extension). Stop when your hands are straight down by your sides. Do not let your hands go behind your body. Hold for __________ seconds. Slowly return to the starting position. Repeat __________ times. Complete this exercise __________ times a day. Shoulder row Sit in a stable chair without armrests, or stand up. Secure an exercise band to a stable object in front of you so it is at waist height. Hold one end of the exercise band in each hand. Position your palms so that your thumbs are facing the ceiling (neutral position). Bend each of your elbows to a 90-degree angle (right angle) and keep your upper arms at your sides. Step back until the band is tight and there is no slack. Slowly pull your elbows back behind you. Hold for __________ seconds. Slowly return to the starting position. Repeat __________ times. Complete this exercise __________ times a day. Shoulder press-ups  Sit in a stable chair that has armrests. Sit upright, with your feet flat on the floor. Put your hands on the armrests so your elbows are bent and your fingers are pointing forward. Your hands should be about even with the sides of your body. Push down on the armrests and use your arms to lift yourself off the chair. Straighten your elbows and lift yourself up as much as you comfortably can. Move your shoulder blades down, and avoid letting your shoulders move up toward your ears. Keep your feet on the ground. As you get stronger, your feet should support less of your body weight as you lift yourself up. Hold for __________ seconds. Slowly lower yourself back into the chair. Repeat __________ times. Complete this exercise __________ times a  day. Wall push-ups  Stand so you are facing a stable wall. Your feet should be about one arm-length away from the wall. Lean forward and place your palms on the wall at shoulder height. Keep your feet flat on the floor as you bend your elbows and lean forward toward the wall. Hold for __________ seconds. Straighten your elbows to push yourself back to the starting position. Repeat __________ times. Complete this exercise __________ times a day. This information is not intended to replace advice given  to you by your health care provider. Make sure you discuss any questions you have with your healthcare provider. Document Revised: 02/26/2019 Document Reviewed: 12/04/2018 Elsevier Patient Education  [place].

## 2021-07-09 DIAGNOSIS — L905 Scar conditions and fibrosis of skin: Secondary | ICD-10-CM | POA: Diagnosis not present

## 2021-07-09 DIAGNOSIS — L570 Actinic keratosis: Secondary | ICD-10-CM | POA: Diagnosis not present

## 2021-07-09 DIAGNOSIS — D225 Melanocytic nevi of trunk: Secondary | ICD-10-CM | POA: Diagnosis not present

## 2021-07-09 DIAGNOSIS — Z85828 Personal history of other malignant neoplasm of skin: Secondary | ICD-10-CM | POA: Diagnosis not present

## 2021-07-09 DIAGNOSIS — L821 Other seborrheic keratosis: Secondary | ICD-10-CM | POA: Diagnosis not present

## 2021-07-09 DIAGNOSIS — D485 Neoplasm of uncertain behavior of skin: Secondary | ICD-10-CM | POA: Diagnosis not present

## 2021-07-09 DIAGNOSIS — L011 Impetiginization of other dermatoses: Secondary | ICD-10-CM | POA: Diagnosis not present

## 2021-07-09 DIAGNOSIS — L309 Dermatitis, unspecified: Secondary | ICD-10-CM | POA: Diagnosis not present

## 2021-07-09 DIAGNOSIS — C44719 Basal cell carcinoma of skin of left lower limb, including hip: Secondary | ICD-10-CM | POA: Diagnosis not present

## 2021-07-09 DIAGNOSIS — L308 Other specified dermatitis: Secondary | ICD-10-CM | POA: Diagnosis not present

## 2021-07-09 DIAGNOSIS — L814 Other melanin hyperpigmentation: Secondary | ICD-10-CM | POA: Diagnosis not present

## 2021-07-24 ENCOUNTER — Ambulatory Visit (INDEPENDENT_AMBULATORY_CARE_PROVIDER_SITE_OTHER): Payer: Medicare Other

## 2021-07-24 ENCOUNTER — Other Ambulatory Visit: Payer: Self-pay | Admitting: Rheumatology

## 2021-07-24 DIAGNOSIS — I255 Ischemic cardiomyopathy: Secondary | ICD-10-CM | POA: Diagnosis not present

## 2021-07-24 DIAGNOSIS — M0579 Rheumatoid arthritis with rheumatoid factor of multiple sites without organ or systems involvement: Secondary | ICD-10-CM

## 2021-07-24 NOTE — Telephone Encounter (Signed)
Next Visit: 11/22/2021  Last Visit: 06/08/2021  Labs: 04/20/2021 Glucose is 107. Rest of CMP WNL.  CBC WNL.   Eye exam: 08/16/2020 WNL    Current Dose per office note 06/08/2021: Plaquenil 200 mg twice daily Monday through Friday only.   [id number] arthritis involving multiple sites with positive rheumatoid factor   Last Fill: 04/30/2021   Okay to refill Plaquenil?

## 2021-07-30 ENCOUNTER — Other Ambulatory Visit: Payer: Self-pay | Admitting: *Deleted

## 2021-07-30 LAB — CUP PACEART REMOTE DEVICE CHECK
Battery Remaining Longevity: 52 mo
Battery Voltage: 2.99 V
Brady Statistic AP VP Percent: 0.28 %
Brady Statistic AP VS Percent: 0.03 %
Brady Statistic AS VP Percent: 99.63 %
Brady Statistic AS VS Percent: 0.07 %
Brady Statistic RA Percent Paced: 0.31 %
Brady Statistic RV Percent Paced: 99.84 %
Date Time Interrogation Session: 20220910195714
Implantable Lead Implant Date: 20170818
Implantable Lead Implant Date: 20170818
Implantable Lead Implant Date: 20170818
Implantable Lead Location: 753858
Implantable Lead Location: 753859
Implantable Lead Location: 753860
Implantable Lead Model: 4396
Implantable Lead Model: 5076
Implantable Lead Model: 5076
Implantable Pulse Generator Implant Date: 20170818
Lead Channel Impedance Value: 361 Ohm
Lead Channel Impedance Value: 437 Ohm
Lead Channel Impedance Value: 494 Ohm
Lead Channel Impedance Value: 494 Ohm
Lead Channel Impedance Value: 551 Ohm
Lead Channel Impedance Value: 570 Ohm
Lead Channel Impedance Value: 627 Ohm
Lead Channel Impedance Value: 665 Ohm
Lead Channel Impedance Value: 931 Ohm
Lead Channel Pacing Threshold Amplitude: 0.375 V
Lead Channel Pacing Threshold Amplitude: 0.5 V
Lead Channel Pacing Threshold Amplitude: 0.5 V
Lead Channel Pacing Threshold Pulse Width: 0.4 ms
Lead Channel Pacing Threshold Pulse Width: 0.4 ms
Lead Channel Pacing Threshold Pulse Width: 0.4 ms
Lead Channel Sensing Intrinsic Amplitude: 1.25 mV
Lead Channel Sensing Intrinsic Amplitude: 1.25 mV
Lead Channel Sensing Intrinsic Amplitude: 4.375 mV
Lead Channel Sensing Intrinsic Amplitude: 4.375 mV
Lead Channel Setting Pacing Amplitude: 1.5 V
Lead Channel Setting Pacing Amplitude: 1.5 V
Lead Channel Setting Pacing Amplitude: 2 V
Lead Channel Setting Pacing Pulse Width: 0.4 ms
Lead Channel Setting Pacing Pulse Width: 0.4 ms
Lead Channel Setting Sensing Sensitivity: 0.9 mV

## 2021-07-30 MED ORDER — METHOTREXATE 2.5 MG PO TABS: ORAL_TABLET | ORAL | 0 refills | Status: DC

## 2021-07-30 NOTE — Telephone Encounter (Signed)
Patient advised she is due to update labs. Patient is uncertain of which lab she is going to go to and will call back to advise so we may place orders.

## 2021-07-30 NOTE — Telephone Encounter (Signed)
Please advise the patient to return to update lab work.

## 2021-07-30 NOTE — Telephone Encounter (Signed)
Refill request received via fax  Next Visit: 11/22/2021   Last Visit: 06/08/2021   Labs: 04/20/2021 Glucose is 107. Rest of CMP WNL.  CBC WNL.   DX: Rheumatoid arthritis involving multiple sites with positive rheumatoid factor   Current Dose per office note 06/08/2021: Methotrexate 4 tablets every 7 days  Last Fill: 01/30/2021  Okay to refill MTX?

## 2021-07-31 ENCOUNTER — Telehealth: Payer: Self-pay | Admitting: *Deleted

## 2021-07-31 DIAGNOSIS — Z79899 Other long term (current) drug therapy: Secondary | ICD-10-CM

## 2021-07-31 NOTE — Telephone Encounter (Signed)
Patient contacted the office stating she needs her lab orders released to [facility]. Orders placed and released.

## 2021-08-02 DIAGNOSIS — Z79899 Other long term (current) drug therapy: Secondary | ICD-10-CM | POA: Diagnosis not present

## 2021-08-02 NOTE — Progress Notes (Signed)
Remote pacemaker transmission.   

## 2021-08-03 ENCOUNTER — Other Ambulatory Visit: Payer: Self-pay | Admitting: Neurology

## 2021-08-03 LAB — CBC WITH DIFFERENTIAL/PLATELET
Basophils Absolute: 0.1 10*3/uL (ref 0.0–0.2)
Basos: 1 %
EOS (ABSOLUTE): 0.5 10*3/uL — ABNORMAL HIGH (ref 0.0–0.4)
Eos: 7 %
Hematocrit: 41.6 % (ref 34.0–46.6)
Hemoglobin: 14.2 g/dL (ref 11.1–15.9)
Immature Grans (Abs): 0 10*3/uL (ref 0.0–0.1)
Immature Granulocytes: 0 %
Lymphocytes Absolute: 1.9 10*3/uL (ref 0.7–3.1)
Lymphs: 25 %
MCH: 32.2 pg (ref 26.6–33.0)
MCHC: 34.1 g/dL (ref 31.5–35.7)
MCV: 94 fL (ref 79–97)
Monocytes Absolute: 0.8 10*3/uL (ref 0.1–0.9)
Monocytes: 11 %
Neutrophils Absolute: 4.1 10*3/uL (ref 1.4–7.0)
Neutrophils: 56 %
Platelets: 288 10*3/uL (ref 150–450)
RBC: 4.41 x10E6/uL (ref 3.77–5.28)
RDW: 12.3 % (ref 11.7–15.4)
WBC: 7.4 10*3/uL (ref 3.4–10.8)

## 2021-08-03 LAB — CMP14+EGFR
ALT: 25 IU/L (ref 0–32)
AST: 38 IU/L (ref 0–40)
Albumin/Globulin Ratio: 1.7 (ref 1.2–2.2)
Albumin: 3.8 g/dL (ref 3.7–4.7)
Alkaline Phosphatase: 78 IU/L (ref 44–121)
BUN/Creatinine Ratio: 22 (ref 12–28)
BUN: 13 mg/dL (ref 8–27)
Bilirubin Total: 0.5 mg/dL (ref 0.0–1.2)
CO2: 27 mmol/L (ref 20–29)
Calcium: 9.3 mg/dL (ref 8.7–10.3)
Chloride: 100 mmol/L (ref 96–106)
Creatinine, Ser: 0.6 mg/dL (ref 0.57–1.00)
Globulin, Total: 2.3 g/dL (ref 1.5–4.5)
Glucose: 79 mg/dL (ref 65–99)
Potassium: 4 mmol/L (ref 3.5–5.2)
Sodium: 141 mmol/L (ref 134–144)
Total Protein: 6.1 g/dL (ref 6.0–8.5)
eGFR: 92 mL/min/{1.73_m2} (ref >59)

## 2021-08-03 MED ORDER — PANTOPRAZOLE SODIUM 40 MG PO TBEC: DELAYED_RELEASE_TABLET | ORAL | 0 refills | Status: DC

## 2021-08-14 ENCOUNTER — Telehealth: Payer: Self-pay

## 2021-08-14 NOTE — Telephone Encounter (Signed)
Patient called stating she has appointment with Dr. Ellie Lunch, eye doctor on Thursday, 08/16/21 and requesting the Plaquenil eye form be emailed to her so she can take it to the appointment.  Email:  [email or web address]

## 2021-08-14 NOTE — Telephone Encounter (Signed)
Spoke with patient and advised unfortunately I am unable to e-mail the PLQ eye exam form. Patient advised I would fax a copy of form to the eye doctor for them to complete and return.

## 2021-08-17 DIAGNOSIS — H5340 Unspecified visual field defects: Secondary | ICD-10-CM | POA: Diagnosis not present

## 2021-08-17 DIAGNOSIS — Z961 Presence of intraocular lens: Secondary | ICD-10-CM | POA: Diagnosis not present

## 2021-08-17 DIAGNOSIS — Z79899 Other long term (current) drug therapy: Secondary | ICD-10-CM | POA: Diagnosis not present

## 2021-08-17 DIAGNOSIS — H52203 Unspecified astigmatism, bilateral: Secondary | ICD-10-CM | POA: Diagnosis not present

## 2021-08-20 MED ORDER — LOSARTAN POTASSIUM 50 MG PO TABS: 50.0000 mg | ORAL_TABLET | Freq: Every day | ORAL | 1 refills | Status: DC

## 2021-08-20 MED ORDER — HYDROCHLOROTHIAZIDE 12.5 MG PO CAPS: ORAL_CAPSULE | ORAL | 1 refills | Status: DC

## 2021-08-20 NOTE — Telephone Encounter (Signed)
Pt made aware via mychart message that per Cecilie Kicks NP, we will split her losartan-HCTZ up into 2 scripts with the following instructions as mentioned in this message and below:  1.)  Take losartan 50 mg po daily  2.)  take HCTZ 12.5 mg po 3 times weekly as needed for lower extremity edema.  Pt aware of new instructions and that both scripts were sent to her confirmed pharmacy of choice.  She is aware to message back or call with any additional questions or concerns regarding this.

## 2021-08-20 NOTE — Telephone Encounter (Signed)
Isaiah Serge, NP  You 3 minutes ago (4:51 PM)   Yes please, sorry I missed that !  Thank you.   Riki Sheer, [place]; Isaiah Serge, NP 2 hours ago (2:27 PM)   Mickel Baas, do you want to split his Losartan and HCTZ into 2 different scripts being he is taking the combo pill with both?    [place], [address] Triage (supporting Maceo, Mickel Baas R, NP) 2 hours ago (2:25 PM)   Thank you for your quick response. My pharmacist requires both prescriptions with instructions sent to her please.  I use [facility] at [address] and [address].  I appreciate your help.  Teddi Broers    Isaiah Serge, NP  Rogalski, Anjana S 3 hours ago (1:15 PM)   Pt was having lower ext edema and reason for HCTZ.  If she wants to hold HCTZ and only take if edema occurs and only 2-3 times per week that is fine.   Cecilie Kicks, NP-C

## 2021-08-30 DIAGNOSIS — J45909 Unspecified asthma, uncomplicated: Secondary | ICD-10-CM | POA: Diagnosis not present

## 2021-08-30 DIAGNOSIS — R142 Eructation: Secondary | ICD-10-CM | POA: Diagnosis not present

## 2021-08-30 DIAGNOSIS — I10 Essential (primary) hypertension: Secondary | ICD-10-CM | POA: Diagnosis not present

## 2021-08-30 DIAGNOSIS — I442 Atrioventricular block, complete: Secondary | ICD-10-CM | POA: Diagnosis not present

## 2021-08-30 DIAGNOSIS — R7303 Prediabetes: Secondary | ICD-10-CM | POA: Diagnosis not present

## 2021-08-30 DIAGNOSIS — Z23 Encounter for immunization: Secondary | ICD-10-CM | POA: Diagnosis not present

## 2021-08-31 ENCOUNTER — Other Ambulatory Visit: Payer: Self-pay | Admitting: Interventional Cardiology

## 2021-09-04 DIAGNOSIS — C44719 Basal cell carcinoma of skin of left lower limb, including hip: Secondary | ICD-10-CM | POA: Diagnosis not present

## 2021-09-05 ENCOUNTER — Other Ambulatory Visit: Payer: Self-pay | Admitting: Interventional Cardiology

## 2021-09-26 ENCOUNTER — Ambulatory Visit (INDEPENDENT_AMBULATORY_CARE_PROVIDER_SITE_OTHER): Payer: Medicare Other | Admitting: Internal Medicine

## 2021-09-26 ENCOUNTER — Other Ambulatory Visit: Payer: Self-pay

## 2021-09-26 VITALS — BP 126/64 | HR 81 | Ht 62.0 in | Wt 133.6 lb

## 2021-09-26 DIAGNOSIS — I255 Ischemic cardiomyopathy: Secondary | ICD-10-CM

## 2021-09-26 DIAGNOSIS — I10 Essential (primary) hypertension: Secondary | ICD-10-CM | POA: Diagnosis not present

## 2021-09-26 DIAGNOSIS — I442 Atrioventricular block, complete: Secondary | ICD-10-CM

## 2021-09-26 DIAGNOSIS — Z950 Presence of cardiac pacemaker: Secondary | ICD-10-CM

## 2021-09-26 NOTE — Patient Instructions (Addendum)
Medication Instructions:  Your physician recommends that you continue on your current medications as directed. Please refer to the Current Medication list given to you today.  Labwork: None ordered.  Testing/Procedures: None ordered.  Follow-Up: Your physician wants you to follow-up in: one year with Tommye Standard, PA-C  Remote monitoring is used to monitor your Pacemaker from home. This monitoring reduces the number of office visits required to check your device to one time per year. It allows [place] to keep an eye on the functioning of your device to ensure it is working properly. You are scheduled for a device check from home on 10/23/2021. You may send your transmission at any time that day. If you have a wireless device, the transmission will be sent automatically. After your physician reviews your transmission, you will receive a postcard with your next transmission date.  Any Other Special Instructions Will Be Listed Below (If Applicable).  If you need a refill on your cardiac medications before your next appointment, please call your pharmacy.

## 2021-09-26 NOTE — Progress Notes (Signed)
HPI Mrs. Petta returns today for followup of her biv PPM. She is a pleasant 75 yo woman with a  H/o HTN, CAD, s/p MI, chronic systolic heart failure, s/p biv PPM insertion in 2017. In the interim, she notes shehas done well with no chest pain or sob, or edema.  Allergies  Allergen Reactions   Betadine [Povidone Iodine] Shortness Of Breath   Hydrocodone Itching     Current Outpatient Medications  Medication Sig Dispense Refill   albuterol (PROVENTIL HFA;VENTOLIN HFA) 108 (90 Base) MCG/ACT inhaler Inhale into the lungs every 6 (six) hours as needed for wheezing or shortness of breath.     aspirin 81 MG tablet Take 81 mg by mouth daily.     atorvastatin (LIPITOR) 40 MG tablet TAKE 1 TABLET(40 MG) BY MOUTH DAILY AT 6 PM 90 tablet 2   budesonide-formoterol (SYMBICORT) 160-4.5 MCG/ACT inhaler Inhale 2 puffs into the lungs 2 (two) times daily.     calcium carbonate (OS-CAL) 600 MG TABS Take 600 mg by mouth 2 (two) times daily with a meal.     citalopram (CELEXA) 20 MG tablet Take 20 mg by mouth daily.  3   clonazePAM (KLONOPIN) 0.5 MG tablet Take 0.5 mg by mouth every 12 (twelve) hours as needed for anxiety.  0   dicyclomine (BENTYL) 20 MG tablet TK 1 T PO TID B MEALS PRN     Ergocalciferol (VITAMIN D2) 2000 units TABS Take 2,000 Units by mouth daily.     folic acid (FOLVITE) 1 MG tablet TAKE 1 TABLET BY MOUTH DAILY 90 tablet 3   hydrochlorothiazide (MICROZIDE) 12.5 MG capsule Take 1 tablet by mouth 3 times weekly as needed for lower extremity swelling. 45 capsule 1   hydroxychloroquine (PLAQUENIL) 200 MG tablet TAKE 1 TABLET BY MOUTH TWICE DAILY MONDAY THROUGH FRIDAY ONLY 120 tablet 0   losartan (COZAAR) 50 MG tablet Take 1 tablet (50 mg total) by mouth daily. 90 tablet 1   methotrexate (RHEUMATREX) 2.5 MG tablet TAKE 4 TABLETS(10 MG) BY MOUTH 1 TIME A WEEK 48 tablet 0   nitroGLYCERIN (NITROSTAT) 0.4 MG SL tablet Place 1 tablet (0.4 mg total) under the tongue every 5 (five) minutes as  needed for chest pain. 25 tablet 2   pantoprazole (PROTONIX) 40 MG tablet TAKE 1 TABLET(40 MG) BY MOUTH DAILY. 90 tablet 0   traZODone (DESYREL) 50 MG tablet Take 50 mg by mouth daily as needed.     No current facility-administered medications for this visit.     Past Medical History:  Diagnosis Date   Anxiety    Asthma    Breast cancer ([place])    left breast   Carpal tunnel syndrome    Depression    Habitual alcohol use    Heart attack ([place])    Hypertension    Personal history of chemotherapy    Personal history of radiation therapy    RA (rheumatoid arthritis) ([place])    Spinal stenosis    Vision abnormalities     ROS:   All systems reviewed and negative except as noted in the HPI.   Past Surgical History:  Procedure Laterality Date   BREAST BIOPSY     BREAST LUMPECTOMY  2005   left   CARDIAC CATHETERIZATION N/A 07/04/2016   Procedure: Left Heart Cath and Coronary Angiography;  Surgeon: Belva Crome, MD;  Location: [facility];  Service: Cardiovascular;  Laterality: N/A;   CARDIAC CATHETERIZATION N/A 07/04/2016  Procedure: Temporary Pacemaker;  Surgeon: Belva Crome, MD;  Location: [facility];  Service: Cardiovascular;  Laterality: N/A;   CARPAL TUNNEL RELEASE     CATARACT EXTRACTION Bilateral    EP IMPLANTABLE DEVICE N/A 07/05/2016   Procedure: Pacemaker Implant;  Surgeon: Evans Lance, MD;  Location: [facility];  Service: Cardiovascular;  Laterality: N/A;   herniated rectum repair   11/2020   LAMINECTOMY  1966   LYMPHECTOMY  2005     Family History  Problem Relation Age of Onset   Stroke Mother    Cancer Father        colon?   Pyelonephritis Sister    Cancer Paternal Aunt    Cancer Paternal Uncle      Social History   Socioeconomic History   Marital status: Widowed    Spouse name: Not on file   Number of children: Not on file   Years of education: Not on file   Highest education level: Not on file  Occupational History    Not on file  Tobacco Use   Smoking status: Former    Packs/day: 1.50    Years: 30.00    Pack years: 45.00    Types: Cigarettes    Quit date: 42    Years since quitting: 27.8   Smokeless tobacco: Never  Vaping Use   Vaping Use: Never used  Substance and Sexual Activity   Alcohol use: Yes    Alcohol/week: 14.0 standard drinks    Types: 14 Cans of beer per week    Comment: 2 DAILY   Drug use: Never   Sexual activity: Not on file  Other Topics Concern   Not on file  Social History Narrative   Not on file   Social Determinants of Health   Financial Resource Strain: Not on file  Food Insecurity: Not on file  Transportation Needs: Not on file  Physical Activity: Not on file  Stress: Not on file  Social Connections: Not on file  Intimate Partner Violence: Not on file     BP 126/64   Pulse 81   Ht 5\' 2"  (1.575 m)   Wt 133 lb 9.6 oz (60.6 kg)   SpO2 91%   BMI 24.44 kg/m   Physical Exam:  Well appearing NAD HEENT: Unremarkable Neck:  No JVD, no thyromegally Lymphatics:  No adenopathy Back:  No CVA tenderness Lungs:  Clear HEART:  Regular rate rhythm, no murmurs, no rubs, no clicks Abd:  soft, positive bowel sounds, no organomegally, no rebound, no guarding Ext:  2 plus pulses, no edema, no cyanosis, no clubbing Skin:  No rashes no nodules Neuro:  CN II through XII intact, motor grossly intact  EKG  DEVICE  Normal device function.  See PaceArt for details.   Assess/Plan:  1. Chronic systolic heart failure -  Her symptoms remain class 2. No change in her meds. 2. Biv PPM - her medtronic biv device is working normally. 3. CAD - she denies anginal symptoms.  4. Dyslipidemia - she will continue her statin therapy with lipitor.   [name] Doc [place],MD

## 2021-09-27 LAB — CUP PACEART INCLINIC DEVICE CHECK
Battery Remaining Longevity: 49 mo
Battery Voltage: 2.99 V
Brady Statistic AP VP Percent: 0.2 %
Brady Statistic AP VS Percent: 0.02 %
Brady Statistic AS VP Percent: 99.72 %
Brady Statistic AS VS Percent: 0.06 %
Brady Statistic RA Percent Paced: 0.22 %
Brady Statistic RV Percent Paced: 99.87 %
Date Time Interrogation Session: 20221109222400
Implantable Lead Implant Date: 20170818
Implantable Lead Implant Date: 20170818
Implantable Lead Implant Date: 20170818
Implantable Lead Location: 753858
Implantable Lead Location: 753859
Implantable Lead Location: 753860
Implantable Lead Model: 4396
Implantable Lead Model: 5076
Implantable Lead Model: 5076
Implantable Pulse Generator Implant Date: 20170818
Lead Channel Impedance Value: 342 Ohm
Lead Channel Impedance Value: 437 Ohm
Lead Channel Impedance Value: 437 Ohm
Lead Channel Impedance Value: 475 Ohm
Lead Channel Impedance Value: 532 Ohm
Lead Channel Impedance Value: 532 Ohm
Lead Channel Impedance Value: 589 Ohm
Lead Channel Impedance Value: 646 Ohm
Lead Channel Impedance Value: 912 Ohm
Lead Channel Pacing Threshold Amplitude: 0.5 V
Lead Channel Pacing Threshold Amplitude: 0.5 V
Lead Channel Pacing Threshold Amplitude: 0.75 V
Lead Channel Pacing Threshold Pulse Width: 0.4 ms
Lead Channel Pacing Threshold Pulse Width: 0.4 ms
Lead Channel Pacing Threshold Pulse Width: 0.4 ms
Lead Channel Sensing Intrinsic Amplitude: 1.25 mV
Lead Channel Sensing Intrinsic Amplitude: 1.5 mV
Lead Channel Sensing Intrinsic Amplitude: 4.375 mV
Lead Channel Sensing Intrinsic Amplitude: 7.75 mV
Lead Channel Setting Pacing Amplitude: 1.5 V
Lead Channel Setting Pacing Amplitude: 1.75 V
Lead Channel Setting Pacing Amplitude: 2 V
Lead Channel Setting Pacing Pulse Width: 0.4 ms
Lead Channel Setting Pacing Pulse Width: 0.4 ms
Lead Channel Setting Sensing Sensitivity: 0.9 mV

## 2021-10-13 ENCOUNTER — Other Ambulatory Visit: Payer: Self-pay | Admitting: Physician Assistant

## 2021-10-13 DIAGNOSIS — M0579 Rheumatoid arthritis with rheumatoid factor of multiple sites without organ or systems involvement: Secondary | ICD-10-CM

## 2021-10-15 NOTE — Telephone Encounter (Signed)
Next Visit: 11/22/2021   Last Visit: 06/08/2021   Labs: 08/02/2021 CMP WNL. Absolute eosinophils are borderline elevated. Total WBC count is WNL.  Rest of CBC WNL.     Eye exam:  08/17/2021 WNL    Current Dose per office note 06/08/2021: Plaquenil 200 mg twice daily Monday through Friday only.    DT:OIZTIWPYKD arthritis involving multiple sites with positive rheumatoid factor    Last Fill: 07/24/2021   Okay to refill Plaquenil?

## 2021-10-22 ENCOUNTER — Other Ambulatory Visit: Payer: Self-pay | Admitting: Physician Assistant

## 2021-10-22 NOTE — Telephone Encounter (Signed)
Next Visit: 11/22/2021  Last Visit: 06/08/2021  Last Fill: 07/30/2021  DX: Rheumatoid arthritis involving multiple sites with positive rheumatoid factor   Current Dose per office note 06/08/2021: Methotrexate 4 tablets every 7 days  Labs: 08/02/2021 CMP WNL. Absolute eosinophils are borderline elevated. Total WBC count is WNL.  Rest of CBC WNL.  Patient advised she is due to update labs this month.   Okay to refill MTX?

## 2021-10-23 ENCOUNTER — Ambulatory Visit (INDEPENDENT_AMBULATORY_CARE_PROVIDER_SITE_OTHER): Payer: Medicare Other

## 2021-10-23 DIAGNOSIS — I442 Atrioventricular block, complete: Secondary | ICD-10-CM

## 2021-10-23 LAB — CUP PACEART REMOTE DEVICE CHECK
Battery Remaining Longevity: 49 mo
Battery Voltage: 2.99 V
Brady Statistic AP VP Percent: 0.35 %
Brady Statistic AP VS Percent: 0.02 %
Brady Statistic AS VP Percent: 99.57 %
Brady Statistic AS VS Percent: 0.06 %
Brady Statistic RA Percent Paced: 0.37 %
Brady Statistic RV Percent Paced: 99.86 %
Date Time Interrogation Session: 20221206163340
Implantable Lead Implant Date: 20170818
Implantable Lead Implant Date: 20170818
Implantable Lead Implant Date: 20170818
Implantable Lead Location: 753858
Implantable Lead Location: 753859
Implantable Lead Location: 753860
Implantable Lead Model: 4396
Implantable Lead Model: 5076
Implantable Lead Model: 5076
Implantable Pulse Generator Implant Date: 20170818
Lead Channel Impedance Value: 323 Ohm
Lead Channel Impedance Value: 399 Ohm
Lead Channel Impedance Value: 437 Ohm
Lead Channel Impedance Value: 475 Ohm
Lead Channel Impedance Value: 475 Ohm
Lead Channel Impedance Value: 494 Ohm
Lead Channel Impedance Value: 589 Ohm
Lead Channel Impedance Value: 627 Ohm
Lead Channel Impedance Value: 836 Ohm
Lead Channel Pacing Threshold Amplitude: 0.375 V
Lead Channel Pacing Threshold Amplitude: 0.5 V
Lead Channel Pacing Threshold Amplitude: 0.625 V
Lead Channel Pacing Threshold Pulse Width: 0.4 ms
Lead Channel Pacing Threshold Pulse Width: 0.4 ms
Lead Channel Pacing Threshold Pulse Width: 0.4 ms
Lead Channel Sensing Intrinsic Amplitude: 1.5 mV
Lead Channel Sensing Intrinsic Amplitude: 1.5 mV
Lead Channel Sensing Intrinsic Amplitude: 4.375 mV
Lead Channel Sensing Intrinsic Amplitude: 7.75 mV
Lead Channel Setting Pacing Amplitude: 1.5 V
Lead Channel Setting Pacing Amplitude: 1.75 V
Lead Channel Setting Pacing Amplitude: 2 V
Lead Channel Setting Pacing Pulse Width: 0.4 ms
Lead Channel Setting Pacing Pulse Width: 0.4 ms
Lead Channel Setting Sensing Sensitivity: 0.9 mV

## 2021-10-30 ENCOUNTER — Telehealth: Payer: Self-pay | Admitting: Rheumatology

## 2021-10-30 DIAGNOSIS — Z79899 Other long term (current) drug therapy: Secondary | ICD-10-CM

## 2021-10-30 NOTE — Telephone Encounter (Signed)
Patient called stating she was wanting to update labs on Thursday and would like orders sent to [facility] on [facility].

## 2021-10-30 NOTE — Telephone Encounter (Signed)
Lab Orders released.  

## 2021-10-31 ENCOUNTER — Other Ambulatory Visit: Payer: Self-pay | Admitting: Interventional Cardiology

## 2021-11-01 DIAGNOSIS — Z79899 Other long term (current) drug therapy: Secondary | ICD-10-CM | POA: Diagnosis not present

## 2021-11-01 NOTE — Progress Notes (Signed)
Remote pacemaker transmission.   

## 2021-11-02 LAB — CBC WITH DIFFERENTIAL/PLATELET
Basophils Absolute: 0.1 10*3/uL (ref 0.0–0.2)
Basos: 1 %
EOS (ABSOLUTE): 0.4 10*3/uL (ref 0.0–0.4)
Eos: 6 %
Hematocrit: 41 % (ref 34.0–46.6)
Hemoglobin: 13.8 g/dL (ref 11.1–15.9)
Immature Grans (Abs): 0 10*3/uL (ref 0.0–0.1)
Immature Granulocytes: 0 %
Lymphocytes Absolute: 1.7 10*3/uL (ref 0.7–3.1)
Lymphs: 28 %
MCH: 31.8 pg (ref 26.6–33.0)
MCHC: 33.7 g/dL (ref 31.5–35.7)
MCV: 95 fL (ref 79–97)
Monocytes Absolute: 0.6 10*3/uL (ref 0.1–0.9)
Monocytes: 10 %
Neutrophils Absolute: 3.4 10*3/uL (ref 1.4–7.0)
Neutrophils: 55 %
Platelets: 284 10*3/uL (ref 150–450)
RBC: 4.34 x10E6/uL (ref 3.77–5.28)
RDW: 12.3 % (ref 11.7–15.4)
WBC: 6.2 10*3/uL (ref 3.4–10.8)

## 2021-11-02 LAB — CMP14+EGFR
ALT: 25 IU/L (ref 0–32)
AST: 34 IU/L (ref 0–40)
Albumin/Globulin Ratio: 1.8 (ref 1.2–2.2)
Albumin: 4 g/dL (ref 3.7–4.7)
Alkaline Phosphatase: 73 IU/L (ref 44–121)
BUN/Creatinine Ratio: 21 (ref 12–28)
BUN: 15 mg/dL (ref 8–27)
Bilirubin Total: 0.5 mg/dL (ref 0.0–1.2)
CO2: 27 mmol/L (ref 20–29)
Calcium: 8.9 mg/dL (ref 8.7–10.3)
Chloride: 100 mmol/L (ref 96–106)
Creatinine, Ser: 0.7 mg/dL (ref 0.57–1.00)
Globulin, Total: 2.2 g/dL (ref 1.5–4.5)
Glucose: 121 mg/dL — ABNORMAL HIGH (ref 70–99)
Potassium: 4 mmol/L (ref 3.5–5.2)
Sodium: 139 mmol/L (ref 134–144)
Total Protein: 6.2 g/dL (ref 6.0–8.5)
eGFR: 89 mL/min/{1.73_m2} (ref >59)

## 2021-11-03 ENCOUNTER — Other Ambulatory Visit: Payer: Self-pay | Admitting: Rheumatology

## 2021-11-05 NOTE — Telephone Encounter (Signed)
Next Visit: 11/22/2021  Last Visit: 06/08/2021  Last Fill: 11/01/2020  Dx: Rheumatoid arthritis involving multiple sites with positive rheumatoid factor   Current Dose per office note on 03/14/6700: folic acid 1 mg daily  Okay to refill Folic Acid?

## 2021-11-08 NOTE — Progress Notes (Signed)
Office Visit Note  Patient: [name]             Date of Birth: Feb 25, 1946           MRN: [id number]             PCP: Glenis Smoker, MD Referring: Glenis Smoker, * Visit Date: 11/22/2021 Occupation: @GUAROCC @  Subjective:  Right shoulder joint pain   History of Present Illness: [name] is a 75 y.o. female with history of seropositive rheumatoid arthritis, osteoarthritis, and DDD.  Patient is currently taking Plaquenil 200 mg 1 tablet by mouth twice daily Monday through Friday and methotrexate 4 tablets by mouth once weekly.  She is tolerating both medications and has not missed any doses recently.  She denies any signs or symptoms of a rheumatoid arthritis flare.  She has been experiencing increased pain in the right shoulder which has been severe at times.  She has noticed limited range of motion in her right shoulder which started to concern her.  She denies any injury or fall prior to the onset of symptoms.  She had a similar flare in the right shoulder in July and was seen in the emergency department on 05/29/2021 at which time she had updated x-rays.  She denies any discomfort in her left shoulder at this time.  She denies any joint swelling.  She states that her right middle trigger finger has been more painful and locking more frequently.  She would like to schedule an ultrasound-guided cortisone injection in the future.  She has noticed some increased fatigue recently.  She has a few small red lesions on her left lower extremity.  She has been using over-the-counter cortisone cream which has helped with the itch.  She has an upcoming appointment with her dermatologist in about 2 weeks. She denies any recent infections.     Activities of Daily Living:  Patient reports morning stiffness for 0 minutes.   Patient Reports nocturnal pain.  Difficulty dressing/grooming: Reports Difficulty climbing stairs: Denies Difficulty getting out of chair:  Denies Difficulty using hands for taps, buttons, cutlery, and/or writing: Reports  Review of Systems  Constitutional:  Positive for fatigue.  HENT:  Positive for mouth dryness and nose dryness. Negative for mouth sores.   Eyes:  Positive for dryness. Negative for pain and itching.  Respiratory:  Positive for shortness of breath and difficulty breathing.   Cardiovascular:  Negative for chest pain and palpitations.  Gastrointestinal:  Negative for blood in stool, constipation and diarrhea.  Endocrine: Negative for increased urination.  Genitourinary:  Negative for difficulty urinating.  Musculoskeletal:  Positive for joint pain, joint pain, joint swelling, myalgias, muscle tenderness and myalgias. Negative for morning stiffness.  Skin:  Positive for rash. Negative for color change.  Allergic/Immunologic: Negative for susceptible to infections.  Neurological:  Positive for weakness. Negative for dizziness, numbness, headaches and memory loss.  Hematological:  Positive for bruising/bleeding tendency.  Psychiatric/Behavioral:  Negative for confusion.    PMFS History:  Patient Active Problem List   Diagnosis Date Noted   Bilateral carpal tunnel syndrome 04/14/2018   Right arm pain 01/07/2018   Cervical spinal stenosis 01/07/2018   Cervical radiculopathy 01/07/2018   Stenosis of left subclavian artery ([place]) 12/15/2017   History of asthma 06/25/2017   History of gastroesophageal reflux (GERD) 06/25/2017   History of anxiety 06/25/2017   History of bilateral carpal tunnel release 06/25/2017   Former smoker 01/25/2017   Rheumatoid arthritis involving multiple  sites with positive rheumatoid factor ([place]) 01/17/2017   High risk medication use 01/17/2017   Primary osteoarthritis of both hands 01/17/2017   Primary osteoarthritis of both feet 01/17/2017   DJD (degenerative joint disease), cervical 01/17/2017   Spondylosis of lumbar region without myelopathy or radiculopathy  01/17/2017   Syncope and collapse 07/07/2016   Cardiomyopathy, ischemic 07/07/2016   CAD- S/P LAD thrombectomy 07/04/16 07/07/2016   Pacemaker-MDT BiV placed 07/05/16 07/07/2016   History of rheumatoid arthritis 07/07/2016   Complete heart block ([place]) 07/04/2016   Essential hypertension 07/04/2016   LBBB (left bundle branch block) 07/04/2016   Ventricular tachycardia 07/04/2016   Faintness 07/04/2016   NSTEMI (non-ST elevated myocardial infarction) ([place]) 07/04/2016   Exostosis 06/18/2011   History of breast cancer 02/16/2010   Vitamin D deficiency 02/15/2010   HYPERCHOLESTEROLEMIA 02/15/2010   ANXIETY 02/15/2010   Asthma 02/15/2010   MIGRAINES, HX OF 02/15/2010   MASTECTOMY, LEFT, HX OF 02/15/2010    Past Medical History:  Diagnosis Date   Anxiety    Asthma    Breast cancer ([place])    left breast   Carpal tunnel syndrome    Depression    Habitual alcohol use    Heart attack ([place])    Hypertension    Personal history of chemotherapy    Personal history of radiation therapy    RA (rheumatoid arthritis) ([place])    Spinal stenosis    Vision abnormalities     Family History  Problem Relation Age of Onset   Stroke Mother    Cancer Father        colon?   Pyelonephritis Sister    Cancer Paternal Aunt    Cancer Paternal Uncle    Past Surgical History:  Procedure Laterality Date   BREAST BIOPSY     BREAST LUMPECTOMY  2005   left   CARDIAC CATHETERIZATION N/A 07/04/2016   Procedure: Left Heart Cath and Coronary Angiography;  Surgeon: Belva Crome, MD;  Location: [facility];  Service: Cardiovascular;  Laterality: N/A;   CARDIAC CATHETERIZATION N/A 07/04/2016   Procedure: Temporary Pacemaker;  Surgeon: Belva Crome, MD;  Location: [facility];  Service: Cardiovascular;  Laterality: N/A;   CARPAL TUNNEL RELEASE     CATARACT EXTRACTION Bilateral    EP IMPLANTABLE DEVICE N/A 07/05/2016   Procedure: Pacemaker Implant;   Surgeon: Evans Lance, MD;  Location: [facility];  Service: Cardiovascular;  Laterality: N/A;   herniated rectum repair   11/2020   LAMINECTOMY  1966   LYMPHECTOMY  2005   Social History   Social History Narrative   Not on file   Immunization History  Administered Date(s) Administered   Influenza Whole 08/21/2009, 08/29/2010   PFIZER(Purple Top)SARS-COV-2 Vaccination 01/14/2020, 02/08/2020, 08/08/2020   Td 01/26/2018     Objective: Vital Signs: BP (!) 148/72 (BP Location: Left Arm, Patient Position: Sitting, Cuff Size: Normal)    Pulse 79    Ht 5\' 2"  (1.575 m)    Wt 134 lb 3.2 oz (60.9 kg)    BMI 24.55 kg/m    Physical Exam Vitals and nursing note reviewed.  Constitutional:      Appearance: She is well-developed.  HENT:     Head: Normocephalic and atraumatic.  Eyes:     Conjunctiva/sclera: Conjunctivae normal.  Pulmonary:     Effort: Pulmonary effort is normal.  Abdominal:     Palpations: Abdomen is soft.  Musculoskeletal:     Cervical back: Normal  range of motion.  Skin:    General: Skin is warm and dry.     Capillary Refill: Capillary refill takes less than 2 seconds.     Comments: Some bluish-gray hyperpigmentation noted on both lower extremities.  Neurological:     Mental Status: She is alert and oriented to person, place, and time.  Psychiatric:        Behavior: Behavior normal.     Musculoskeletal Exam: C-spine has slightly limited range of motion without rotation.  Painful range of motion of the right shoulder joint.  Left shoulder has full range of motion with no discomfort.  Elbow joints, wrist joints, MCPs, PIPs, DIPs have good range of motion with no synovitis.  Synovial thickening or deviation of MCP joints.  Right middle trigger finger noted.  PIP and DIP thickening consistent with osteoarthritis of both hands.  Hip joints have good range of motion with no groin pain.  Knee joints have good range of motion with no warmth or effusion.  Ankle  joints have good range of motion with no tenderness or joint swelling.  CDAI Exam: CDAI Score: -- Patient Global: --; Provider Global: -- Swollen: --; Tender: -- Joint Exam 11/22/2021   No joint exam has been documented for this visit   There is currently no information documented on the homunculus. Go to the Rheumatology activity and complete the homunculus joint exam.  Investigation: No additional findings.  Imaging: CUP PACEART REMOTE DEVICE CHECK  Result Date: 10/23/2021 Scheduled remote reviewed. Normal device function.  Next remote 91 days- JJB   Recent Labs: Lab Results  Component Value Date   WBC 6.2 11/01/2021   HGB 13.8 11/01/2021   PLT 284 11/01/2021   NA 139 11/01/2021   K 4.0 11/01/2021   CL 100 11/01/2021   CO2 27 11/01/2021   GLUCOSE 121 (H) 11/01/2021   BUN 15 11/01/2021   CREATININE 0.70 11/01/2021   BILITOT 0.5 11/01/2021   ALKPHOS 73 11/01/2021   AST 34 11/01/2021   ALT 25 11/01/2021   PROT 6.2 11/01/2021   ALBUMIN 4.0 11/01/2021   CALCIUM 8.9 11/01/2021   GFRAA 108 12/26/2020    Speciality Comments: PLQ Eye Exam: 08/17/2021 WNL @ [facility] PEr GOA Visual not perfect OD will recheck in 6 months.   Procedures:  Large Joint Inj: R subacromial bursa on 11/22/2021 11:58 AM Indications: pain Details: 27 G 1.5 in needle, posterior approach  Arthrogram: No  Medications: 1 mL lidocaine 1 %; 40 mg triamcinolone acetonide 40 MG/ML Aspirate: 0 mL Outcome: tolerated well, no immediate complications Procedure, treatment alternatives, risks and benefits explained, specific risks discussed. Consent was given by the patient. Immediately prior to procedure a time out was called to verify the correct patient, procedure, equipment, support staff and site/side marked as required. Patient was prepped and draped in the usual sterile fashion.    Allergies: Betadine [povidone iodine] and Hydrocodone   Assessment / Plan:     Visit Diagnoses: Rheumatoid arthritis  involving multiple sites with positive rheumatoid factor (HCC) - +RF, +anti-CCP, +ANA: She presents today with increased pain in the right shoulder.  She has not had any injury or fall.  Previous x-rays on 05/29/2021 revealed mild degenerative changes in the [facility] joint.  A right subacromial cortisone injection was performed today in the office.  Overall her rheumatoid arthritis is well controlled on the current treatment regimen: Methotrexate 4 tablets weekly, folic acid 1 mg daily, and Plaquenil 200 mg 1 tablet by mouth twice daily  Monday through Friday.  She is tolerating these medications without any side effects and has not missed any doses recently.  She has not had any recent infections.  Se had no synovitis on examination today.  Synovial thickening in all the deviation of MCP joints noted.  Right elbow joint contracture is unchanged.  No medication changes will be made at this time.  She was advised to notify us if she develops signs or symptoms of a flare.  She will follow-up in the office in 5 months.  High risk medication use - Methotrexate 4 tablets every 7 days, folic acid 1 mg daily, and Plaquenil 200 mg twice daily Monday through Friday only. CBC and CMP updated on 11/01/2021.  She will be due to update lab work in March and every 3 months to monitor for toxicity.  Standing orders for CBC and CMP remain in place. PLQ Eye Exam: 08/17/2021 WNL @ [facility] Per GOA Visual not perfect OD will recheck in 6 months.  Patient was told that she has some mild findings consistent with possible macular degeneration.  Hydroxychloroquine blood level will be checked today. Discussed the importance of holding methotrexate if she develops signs or symptoms of an infection and to resume once the infection has completely cleared.  Arthritis of right acromioclavicular joint - XR 05/29/21: Moderate degenerative changes in [facility] joint.  X-ray results were reviewed with the patient today in the office.  Different treatment options  were discussed.  She requested a cortisone injection today.  She tolerated the right subacromial cortisone injection.  Procedure note was completed above.  Aftercare was discussed.  She was given a handout of shoulder exercises to perform once her symptoms have improved.  She was advised to notify us if her symptoms persist or worsen at which time referral to physical therapy will be placed.  If she continues to have increased pain and limited range of motion further imaging will be ordered at that time.  Contracture of right elbow: Unchanged.  No tenderness or inflammation was noted.  Primary osteoarthritis of both hands: She has PIP and DIP thickening consistent with osteoarthritis of both hands.  Synovial thickening and ulnar deviation of MCP joints noted.  No synovitis noted.  Discussed the importance of joint protection and muscle strengthening.  Trigger finger, right middle finger: She has been experiencing increased tenderness and locking of the right middle finger.  She has had difficulty turning her key in the ignition due to the severity of pain.  She previously had a cortisone injection on 01/05/2021 which provided significant relief.  She would like to schedule a repeat ultrasound-guided cortisone injection.  Primary osteoarthritis of right knee: She has good range of motion of the right knee joint with no warmth or effusion.  Primary osteoarthritis of both feet: She has PIP and DIP thickening consistent with osteoarthritis of both feet.  Ankle joints have good range of motion with no tenderness or joint swelling.  She is wearing proper fitting shoes.  DDD (degenerative disc disease), cervical: She has limited range of motion with lateral rotation of the C-spine.  Discussed the importance performing neck exercises daily.  She was given a handout of exercises to perform.  DDD (degenerative disc disease), lumbar: She is not experiencing any increased discomfort in her lower back at this time.   No symptoms of radiculopathy.  Osteoporosis screening - DEXA 02/21/20: DualFemur Neck Left 02/21/2020 76.0 -0.8 0.929 g/cm2.  She is taking calcium 600 mg twice daily and vitamin D  2000 units daily.  History of vitamin D deficiency: She is taking vitamin D 2000 units daily.  Rash and other nonspecific skin eruption: She has 2 erythematous patches on her left lower extremity which have been itching.  She has been using over-the-counter cortisone cream which has alleviated the itching sensation.  She has an upcoming appoint with her dermatologist for further evaluation.  Other medical conditions are listed as follows:  History of anxiety  History of hypertension: Discussed the importance of close blood pressure monitoring.  History of hyperlipidemia  History of left bundle branch block  History of asthma  History of breast cancer  History of gastroesophageal reflux (GERD)  History of ventricular tachycardia  Former smoker  Orders: Orders Placed This Encounter  Procedures   Large Joint Inj: R subacromial bursa   Hydroxychloroquine, Blood   No orders of the defined types were placed in this encounter.     Follow-Up Instructions: Return in about 5 months (around 04/22/2022) for Rheumatoid arthritis, Osteoarthritis, DDD.   Ofilia Neas, PA-C  Note - This record has been created using Dragon software.  Chart creation errors have been sought, but may not always  have been located. Such creation errors do not reflect on  the standard of medical care.

## 2021-11-21 ENCOUNTER — Ambulatory Visit: Payer: Medicare Other | Admitting: Physician Assistant

## 2021-11-21 ENCOUNTER — Other Ambulatory Visit: Payer: Self-pay | Admitting: Cardiology

## 2021-11-22 ENCOUNTER — Telehealth: Payer: Self-pay | Admitting: Rheumatology

## 2021-11-22 ENCOUNTER — Ambulatory Visit: Payer: Medicare Other | Admitting: Physician Assistant

## 2021-11-22 ENCOUNTER — Encounter: Payer: Self-pay | Admitting: Physician Assistant

## 2021-11-22 ENCOUNTER — Other Ambulatory Visit: Payer: Self-pay

## 2021-11-22 VITALS — BP 148/72 | HR 79 | Ht 62.0 in | Wt 134.2 lb

## 2021-11-22 DIAGNOSIS — Z8639 Personal history of other endocrine, nutritional and metabolic disease: Secondary | ICD-10-CM

## 2021-11-22 DIAGNOSIS — M19041 Primary osteoarthritis, right hand: Secondary | ICD-10-CM | POA: Diagnosis not present

## 2021-11-22 DIAGNOSIS — Z8679 Personal history of other diseases of the circulatory system: Secondary | ICD-10-CM

## 2021-11-22 DIAGNOSIS — M5030 Other cervical disc degeneration, unspecified cervical region: Secondary | ICD-10-CM | POA: Diagnosis not present

## 2021-11-22 DIAGNOSIS — M1711 Unilateral primary osteoarthritis, right knee: Secondary | ICD-10-CM | POA: Diagnosis not present

## 2021-11-22 DIAGNOSIS — Z79899 Other long term (current) drug therapy: Secondary | ICD-10-CM | POA: Diagnosis not present

## 2021-11-22 DIAGNOSIS — M19071 Primary osteoarthritis, right ankle and foot: Secondary | ICD-10-CM | POA: Diagnosis not present

## 2021-11-22 DIAGNOSIS — Z87891 Personal history of nicotine dependence: Secondary | ICD-10-CM

## 2021-11-22 DIAGNOSIS — Z8709 Personal history of other diseases of the respiratory system: Secondary | ICD-10-CM

## 2021-11-22 DIAGNOSIS — M24521 Contracture, right elbow: Secondary | ICD-10-CM

## 2021-11-22 DIAGNOSIS — M0579 Rheumatoid arthritis with rheumatoid factor of multiple sites without organ or systems involvement: Secondary | ICD-10-CM | POA: Diagnosis not present

## 2021-11-22 DIAGNOSIS — R21 Rash and other nonspecific skin eruption: Secondary | ICD-10-CM

## 2021-11-22 DIAGNOSIS — Z9889 Other specified postprocedural states: Secondary | ICD-10-CM | POA: Diagnosis not present

## 2021-11-22 DIAGNOSIS — Z8719 Personal history of other diseases of the digestive system: Secondary | ICD-10-CM

## 2021-11-22 DIAGNOSIS — M5136 Other intervertebral disc degeneration, lumbar region: Secondary | ICD-10-CM | POA: Diagnosis not present

## 2021-11-22 DIAGNOSIS — M51369 Other intervertebral disc degeneration, lumbar region without mention of lumbar back pain or lower extremity pain: Secondary | ICD-10-CM

## 2021-11-22 DIAGNOSIS — Z853 Personal history of malignant neoplasm of breast: Secondary | ICD-10-CM

## 2021-11-22 DIAGNOSIS — M19042 Primary osteoarthritis, left hand: Secondary | ICD-10-CM

## 2021-11-22 DIAGNOSIS — M65331 Trigger finger, right middle finger: Secondary | ICD-10-CM | POA: Diagnosis not present

## 2021-11-22 DIAGNOSIS — Z13820 Encounter for screening for osteoporosis: Secondary | ICD-10-CM

## 2021-11-22 DIAGNOSIS — Z8659 Personal history of other mental and behavioral disorders: Secondary | ICD-10-CM

## 2021-11-22 DIAGNOSIS — M19011 Primary osteoarthritis, right shoulder: Secondary | ICD-10-CM | POA: Diagnosis not present

## 2021-11-22 DIAGNOSIS — M19072 Primary osteoarthritis, left ankle and foot: Secondary | ICD-10-CM

## 2021-11-22 MED ORDER — TRIAMCINOLONE ACETONIDE 40 MG/ML IJ SUSP
40.0000 mg | INTRAMUSCULAR | Status: AC | PRN
  Administered 2021-11-22: 40 mg via INTRA_ARTICULAR

## 2021-11-22 MED ORDER — LIDOCAINE HCL 1 % IJ SOLN
1.0000 mL | INTRAMUSCULAR | Status: AC | PRN
  Administered 2021-11-22: 1 mL

## 2021-11-22 NOTE — Telephone Encounter (Signed)
Patient was seen today 11/22/21. At checkout she needed to be scheduled for an U/S guided right middle trigger finger injection. Dr. Arlean Hopping Thursday afternoons are booked until March. Please advise.

## 2021-11-22 NOTE — Patient Instructions (Addendum)
Standing Labs We placed an order today for your standing lab work.   Please have your standing labs drawn in March and every 3 months   If possible, please have your labs drawn 2 weeks prior to your appointment so that the provider can discuss your results at your appointment.  Please note that you may see your imaging and lab results in [place] before we have reviewed them. We may be awaiting multiple results to interpret others before contacting you. Please allow our office up to 72 hours to thoroughly review all of the results before contacting the office for clarification of your results.  We have open lab daily: Monday through Thursday from 1:30-4:30 PM and Friday from 1:30-4:00 PM at the office of Dr. Bo Merino, [facility].   Please be advised, all patients with office appointments requiring lab work will take precedent over walk-in lab work.  If possible, please come for your lab work on Monday and Friday afternoons, as you may experience shorter wait times. The office is located at [address], [place], [place], [place] [address] No appointment is necessary.   Labs are drawn by [facility]. Please bring your co-pay at the time of your lab draw.  You may receive a bill from [facility] for your lab work.  If you wish to have your labs drawn at another location, please call the office 24 hours in advance to send orders.  If you have any questions regarding directions or hours of operation,  please call [phone].   As a reminder, please drink plenty of water prior to coming for your lab work. Thanks!  If you have signs or symptoms of an infection or start antibiotics: First, call your PCP for workup of your infection. Hold your medication through the infection, until you complete your antibiotics, and until symptoms resolve if you take the following: Injectable medication (Actemra, Benlysta, Cimzia, Cosentyx, Enbrel, Humira, Kevzara, Orencia, Remicade, Simponi,  Stelara, Taltz, Tremfya) Methotrexate Leflunomide (Arava) Mycophenolate (Cellcept) Roma Kayser, or Rinvoq   Shoulder Exercises Ask your health care provider which exercises are safe for you. Do exercises exactly as told by your health care provider and adjust them as directed. It is normal to feel mild stretching, pulling, tightness, or discomfort as you do these exercises. Stop right away if you feel sudden pain or your pain gets worse. Do not begin these exercises until told by your health care provider. Stretching exercises External rotation and abduction This exercise is sometimes called corner stretch. This exercise rotates your arm outward (external rotation) and moves your arm out from your body (abduction). Stand in a doorway with one of your feet slightly in front of the other. This is called a staggered stance. If you cannot reach your forearms to the door frame, stand facing a corner of a room. Choose one of the following positions as told by your health care provider: Place your hands and forearms on the door frame above your head. Place your hands and forearms on the door frame at the height of your head. Place your hands on the door frame at the height of your elbows. Slowly move your weight onto your front foot until you feel a stretch across your chest and in the front of your shoulders. Keep your head and chest upright and keep your abdominal muscles tight. Hold for __________ seconds. To release the stretch, shift your weight to your back foot. Repeat __________ times. Complete this exercise __________ times a day. Extension, standing  Stand and hold a broomstick, a cane, or a similar object behind your back. Your hands should be a little wider than shoulder width apart. Your palms should face away from your back. Keeping your elbows straight and your shoulder muscles relaxed, move the stick away from your body until you feel a stretch in your shoulders  (extension). Avoid shrugging your shoulders while you move the stick. Keep your shoulder blades tucked down toward the middle of your back. Hold for __________ seconds. Slowly return to the starting position. Repeat __________ times. Complete this exercise __________ times a day. Range-of-motion exercises Pendulum  Stand near a wall or a surface that you can hold onto for balance. Bend at the waist and let your left / right arm hang straight down. Use your other arm to support you. Keep your back straight and do not lock your knees. Relax your left / right arm and shoulder muscles, and move your hips and your trunk so your left / right arm swings freely. Your arm should swing because of the motion of your body, not because you are using your arm or shoulder muscles. Keep moving your hips and trunk so your arm swings in the following directions, as told by your health care provider: Side to side. Forward and backward. In clockwise and counterclockwise circles. Continue each motion for __________ seconds, or for as long as told by your health care provider. Slowly return to the starting position. Repeat __________ times. Complete this exercise __________ times a day. Shoulder flexion, standing  Stand and hold a broomstick, a cane, or a similar object. Place your hands a little more than shoulder width apart on the object. Your left / right hand should be palm up, and your other hand should be palm down. Keep your elbow straight and your shoulder muscles relaxed. Push the stick up with your healthy arm to raise your left / right arm in front of your body, and then over your head until you feel a stretch in your shoulder (flexion). Avoid shrugging your shoulder while you raise your arm. Keep your shoulder blade tucked down toward the middle of your back. Hold for __________ seconds. Slowly return to the starting position. Repeat __________ times. Complete this exercise __________ times a  day. Shoulder abduction, standing Stand and hold a broomstick, a cane, or a similar object. Place your hands a little more than shoulder width apart on the object. Your left / right hand should be palm up, and your other hand should be palm down. Keep your elbow straight and your shoulder muscles relaxed. Push the object across your body toward your left / right side. Raise your left / right arm to the side of your body (abduction) until you feel a stretch in your shoulder. Do not raise your arm above shoulder height unless your health care provider tells you to do that. If directed, raise your arm over your head. Avoid shrugging your shoulder while you raise your arm. Keep your shoulder blade tucked down toward the middle of your back. Hold for __________ seconds. Slowly return to the starting position. Repeat __________ times. Complete this exercise __________ times a day. Internal rotation  Place your left / right hand behind your back, palm up. Use your other hand to dangle an exercise band, a towel, or a similar object over your shoulder. Grasp the band with your left / right hand so you are holding on to both ends. Gently pull up on the band until you feel  a stretch in the front of your left / right shoulder. The movement of your arm toward the center of your body is called internal rotation. Avoid shrugging your shoulder while you raise your arm. Keep your shoulder blade tucked down toward the middle of your back. Hold for __________ seconds. Release the stretch by letting go of the band and lowering your hands. Repeat __________ times. Complete this exercise __________ times a day. Strengthening exercises External rotation  Sit in a stable chair without armrests. Secure an exercise band to a stable object at elbow height on your left / right side. Place a soft object, such as a folded towel or a small pillow, between your left / right upper arm and your body to move your elbow about 4  inches (10 cm) away from your side. Hold the end of the exercise band so it is tight and there is no slack. Keeping your elbow pressed against the soft object, slowly move your forearm out, away from your abdomen (external rotation). Keep your body steady so only your forearm moves. Hold for __________ seconds. Slowly return to the starting position. Repeat __________ times. Complete this exercise __________ times a day. Shoulder abduction  Sit in a stable chair without armrests, or stand up. Hold a __________ weight in your left / right hand, or hold an exercise band with both hands. Start with your arms straight down and your left / right palm facing in, toward your body. Slowly lift your left / right hand out to your side (abduction). Do not lift your hand above shoulder height unless your health care provider tells you that this is safe. Keep your arms straight. Avoid shrugging your shoulder while you do this movement. Keep your shoulder blade tucked down toward the middle of your back. Hold for __________ seconds. Slowly lower your arm, and return to the starting position. Repeat __________ times. Complete this exercise __________ times a day. Shoulder extension Sit in a stable chair without armrests, or stand up. Secure an exercise band to a stable object in front of you so it is at shoulder height. Hold one end of the exercise band in each hand. Your palms should face each other. Straighten your elbows and lift your hands up to shoulder height. Step back, away from the secured end of the exercise band, until the band is tight and there is no slack. Squeeze your shoulder blades together as you pull your hands down to the sides of your thighs (extension). Stop when your hands are straight down by your sides. Do not let your hands go behind your body. Hold for __________ seconds. Slowly return to the starting position. Repeat __________ times. Complete this exercise __________ times a  day. Shoulder row Sit in a stable chair without armrests, or stand up. Secure an exercise band to a stable object in front of you so it is at waist height. Hold one end of the exercise band in each hand. Position your palms so that your thumbs are facing the ceiling (neutral position). Bend each of your elbows to a 90-degree angle (right angle) and keep your upper arms at your sides. Step back until the band is tight and there is no slack. Slowly pull your elbows back behind you. Hold for __________ seconds. Slowly return to the starting position. Repeat __________ times. Complete this exercise __________ times a day. Shoulder press-ups  Sit in a stable chair that has armrests. Sit upright, with your feet flat on the floor. Put  your hands on the armrests so your elbows are bent and your fingers are pointing forward. Your hands should be about even with the sides of your body. Push down on the armrests and use your arms to lift yourself off the chair. Straighten your elbows and lift yourself up as much as you comfortably can. Move your shoulder blades down, and avoid letting your shoulders move up toward your ears. Keep your feet on the ground. As you get stronger, your feet should support less of your body weight as you lift yourself up. Hold for __________ seconds. Slowly lower yourself back into the chair. Repeat __________ times. Complete this exercise __________ times a day. Wall push-ups  Stand so you are facing a stable wall. Your feet should be about one arm-length away from the wall. Lean forward and place your palms on the wall at shoulder height. Keep your feet flat on the floor as you bend your elbows and lean forward toward the wall. Hold for __________ seconds. Straighten your elbows to push yourself back to the starting position. Repeat __________ times. Complete this exercise __________ times a day. This information is not intended to replace advice given to you by your  health care provider. Make sure you discuss any questions you have with your health care provider. Document Revised: 02/26/2019 Document Reviewed: 12/04/2018 Elsevier Patient Education  2022 [place].   Neck Exercises Ask your health care provider which exercises are safe for you. Do exercises exactly as told by your health care provider and adjust them as directed. It is normal to feel mild stretching, pulling, tightness, or discomfort as you do these exercises. Stop right away if you feel sudden pain or your pain gets worse. Do not begin these exercises until told by your health care provider. Neck exercises can be important for many reasons. They can improve strength and maintain flexibility in your neck, which will help your upper back and prevent neck pain. Stretching exercises Rotation neck stretching  Sit in a chair or stand up. Place your feet flat on the floor, shoulder-width apart. Slowly turn your head (rotate) to the right until a slight stretch is felt. Turn it all the way to the right so you can look over your right shoulder. Do not tilt or tip your head. Hold this position for 10-30 seconds. Slowly turn your head (rotate) to the left until a slight stretch is felt. Turn it all the way to the left so you can look over your left shoulder. Do not tilt or tip your head. Hold this position for 10-30 seconds. Repeat __________ times. Complete this exercise __________ times a day. Neck retraction  Sit in a sturdy chair or stand up. Look straight ahead. Do not bend your neck. Use your fingers to push your chin backward (retraction). Do not bend your neck for this movement. Continue to face straight ahead. If you are doing the exercise properly, you will feel a slight sensation in your throat and a stretch at the back of your neck. Hold the stretch for 1-2 seconds. Repeat __________ times. Complete this exercise __________ times a day. Strengthening exercises Neck press  Lie on  your back on a firm bed or on the floor with a pillow under your head. Use your neck muscles to push your head down on the pillow and straighten your spine. Hold the position as well as you can. Keep your head facing up (in a neutral position) and your chin tucked. Slowly count to 5  while holding this position. Repeat __________ times. Complete this exercise __________ times a day. Isometrics These are exercises in which you strengthen the muscles in your neck while keeping your neck still (isometrics). Sit in a supportive chair and place your hand on your forehead. Keep your head and face facing straight ahead. Do not flex or extend your neck while doing isometrics. Push forward with your head and neck while pushing back with your hand. Hold for 10 seconds. Do the sequence again, this time putting your hand against the back of your head. Use your head and neck to push backward against the hand pressure. Finally, do the same exercise on either side of your head, pushing sideways against the pressure of your hand. Repeat __________ times. Complete this exercise __________ times a day. Prone head lifts  Lie face-down (prone position), resting on your elbows so that your chest and upper back are raised. Start with your head facing downward, near your chest. Position your chin either on or near your chest. Slowly lift your head upward. Lift until you are looking straight ahead. Then continue lifting your head as far back as you can comfortably stretch. Hold your head up for 5 seconds. Then slowly lower it to your starting position. Repeat __________ times. Complete this exercise __________ times a day. Supine head lifts  Lie on your back (supine position), bending your knees to point to the ceiling and keeping your feet flat on the floor. Lift your head slowly off the floor, raising your chin toward your chest. Hold for 5 seconds. Repeat __________ times. Complete this exercise __________ times  a day. Scapular retraction  Stand with your arms at your sides. Look straight ahead. Slowly pull both shoulders (scapulae) backward and downward (retraction) until you feel a stretch between your shoulder blades in your upper back. Hold for 10-30 seconds. Relax and repeat. Repeat __________ times. Complete this exercise __________ times a day. Contact a health care provider if: Your neck pain or discomfort gets worse when you do an exercise. Your neck pain or discomfort does not improve within 2 hours after you exercise. If you have any of these problems, stop exercising right away. Do not do the exercises again unless your health care provider says that you can. Get help right away if: You develop sudden, severe neck pain. If this happens, stop exercising right away. Do not do the exercises again unless your health care provider says that you can. This information is not intended to replace advice given to you by your health care provider. Make sure you discuss any questions you have with your health care provider. Document Revised: 05/01/2021 Document Reviewed: 05/01/2021 Elsevier Patient Education  [place].

## 2021-11-28 LAB — HYDROXYCHLOROQUINE,BLOOD: HYDROXYCHLOROQUINE, (B): 1900 ng/mL — ABNORMAL HIGH

## 2021-11-28 NOTE — Progress Notes (Signed)
Hydroxychloroquine level is high: 1,900 which is within the toxicity range.  Please advise the patient to reduce Plaquenil to once daily.  Recheck hydroxychloroquine level in 3 months. Please have her send [place] the updated Plaquenil eye examination once it is up-to-date.

## 2021-11-29 ENCOUNTER — Ambulatory Visit (INDEPENDENT_AMBULATORY_CARE_PROVIDER_SITE_OTHER): Payer: Medicare Other | Admitting: Rheumatology

## 2021-11-29 ENCOUNTER — Ambulatory Visit: Payer: Self-pay

## 2021-11-29 ENCOUNTER — Other Ambulatory Visit: Payer: Self-pay

## 2021-11-29 DIAGNOSIS — M79644 Pain in right finger(s): Secondary | ICD-10-CM | POA: Diagnosis not present

## 2021-11-29 DIAGNOSIS — M0579 Rheumatoid arthritis with rheumatoid factor of multiple sites without organ or systems involvement: Secondary | ICD-10-CM

## 2021-11-29 MED ORDER — TRIAMCINOLONE ACETONIDE 40 MG/ML IJ SUSP
10.0000 mg | INTRAMUSCULAR | Status: AC | PRN
  Administered 2021-11-29: 10 mg

## 2021-11-29 MED ORDER — LIDOCAINE HCL 1 % IJ SOLN
0.5000 mL | INTRAMUSCULAR | Status: AC | PRN
  Administered 2021-11-29: .5 mL

## 2021-11-29 MED ORDER — HYDROXYCHLOROQUINE SULFATE 200 MG PO TABS: 200.0000 mg | ORAL_TABLET | Freq: Every day | ORAL | 0 refills | Status: DC

## 2021-11-29 NOTE — Progress Notes (Signed)
° °  Procedure Note  Patient: [name]             Date of Birth: [birth year]           MRN: [id number]             Visit Date: 11/29/2021  Procedures: Visit Diagnoses:  1. Pain in right finger(s)   2. Rheumatoid arthritis involving multiple sites with positive rheumatoid factor (HCC)     Hand/UE Inj: R long A1 for trigger finger on 11/29/2021 3:27 PM Indications: pain, tendon swelling and therapeutic Details: 27 G needle, ultrasound-guided volar approach Medications: 0.5 mL lidocaine 1 %; 10 mg triamcinolone acetonide 40 MG/ML Aspirate: 0 mL Procedure, treatment alternatives, risks and benefits explained, specific risks discussed. Immediately prior to procedure a time out was called to verify the correct patient, procedure, equipment, support staff and site/side marked as required. Patient was prepped and draped in the usual sterile fashion.   Patient tolerated the procedure well.  Patient is using a finger splint. Postprocedure instructions were given.  Bo Merino, MD

## 2021-12-05 DIAGNOSIS — D485 Neoplasm of uncertain behavior of skin: Secondary | ICD-10-CM | POA: Diagnosis not present

## 2021-12-05 DIAGNOSIS — B353 Tinea pedis: Secondary | ICD-10-CM | POA: Diagnosis not present

## 2021-12-05 DIAGNOSIS — Z85828 Personal history of other malignant neoplasm of skin: Secondary | ICD-10-CM | POA: Diagnosis not present

## 2021-12-05 DIAGNOSIS — Z08 Encounter for follow-up examination after completed treatment for malignant neoplasm: Secondary | ICD-10-CM | POA: Diagnosis not present

## 2021-12-05 DIAGNOSIS — C44629 Squamous cell carcinoma of skin of left upper limb, including shoulder: Secondary | ICD-10-CM | POA: Diagnosis not present

## 2021-12-18 ENCOUNTER — Encounter: Payer: Self-pay | Admitting: Rheumatology

## 2021-12-19 DIAGNOSIS — C44629 Squamous cell carcinoma of skin of left upper limb, including shoulder: Secondary | ICD-10-CM | POA: Diagnosis not present

## 2022-01-17 ENCOUNTER — Telehealth: Payer: Self-pay | Admitting: Rheumatology

## 2022-01-17 MED ORDER — METHOTREXATE 2.5 MG PO TABS
ORAL_TABLET | ORAL | 0 refills | Status: DC
Start: 2022-01-17 — End: 2022-04-17

## 2022-01-17 NOTE — Telephone Encounter (Signed)
Patient called the office requesting a refill of Methotrexate to be sent to [facility] on [address]. ?

## 2022-01-17 NOTE — Telephone Encounter (Signed)
Next Visit: 04/24/2022 ? ?Last Visit: 11/22/2021 ? ?Last Fill: 10/22/2021 ? ?DX: Rheumatoid arthritis involving multiple sites with positive rheumatoid factor  ? ?Current Dose per office note 11/22/2021: Methotrexate 4 tablets weekly ? ?Labs: 11/01/2021 Glucose is 121.  Rest of CMP WNL.  CBC WNL. ? ?Okay to refill MTX?  ?

## 2022-01-22 ENCOUNTER — Ambulatory Visit (INDEPENDENT_AMBULATORY_CARE_PROVIDER_SITE_OTHER): Payer: Medicare Other

## 2022-01-22 DIAGNOSIS — I442 Atrioventricular block, complete: Secondary | ICD-10-CM | POA: Diagnosis not present

## 2022-01-23 LAB — CUP PACEART REMOTE DEVICE CHECK
Battery Remaining Longevity: 45 mo
Battery Voltage: 2.98 V
Brady Statistic AP VP Percent: 0.31 %
Brady Statistic AP VS Percent: 0.02 %
Brady Statistic AS VP Percent: 99.6 %
Brady Statistic AS VS Percent: 0.08 %
Brady Statistic RA Percent Paced: 0.33 %
Brady Statistic RV Percent Paced: 99.72 %
Date Time Interrogation Session: 20230308031023
Implantable Lead Implant Date: 20170818
Implantable Lead Implant Date: 20170818
Implantable Lead Implant Date: 20170818
Implantable Lead Location: 753858
Implantable Lead Location: 753859
Implantable Lead Location: 753860
Implantable Lead Model: 4396
Implantable Lead Model: 5076
Implantable Lead Model: 5076
Implantable Pulse Generator Implant Date: 20170818
Lead Channel Impedance Value: 342 Ohm
Lead Channel Impedance Value: 456 Ohm
Lead Channel Impedance Value: 456 Ohm
Lead Channel Impedance Value: 494 Ohm
Lead Channel Impedance Value: 513 Ohm
Lead Channel Impedance Value: 532 Ohm
Lead Channel Impedance Value: 608 Ohm
Lead Channel Impedance Value: 665 Ohm
Lead Channel Impedance Value: 912 Ohm
Lead Channel Pacing Threshold Amplitude: 0.5 V
Lead Channel Pacing Threshold Amplitude: 0.5 V
Lead Channel Pacing Threshold Amplitude: 0.625 V
Lead Channel Pacing Threshold Pulse Width: 0.4 ms
Lead Channel Pacing Threshold Pulse Width: 0.4 ms
Lead Channel Pacing Threshold Pulse Width: 0.4 ms
Lead Channel Sensing Intrinsic Amplitude: 2.25 mV
Lead Channel Sensing Intrinsic Amplitude: 2.25 mV
Lead Channel Sensing Intrinsic Amplitude: 9.5 mV
Lead Channel Sensing Intrinsic Amplitude: 9.5 mV
Lead Channel Setting Pacing Amplitude: 1.5 V
Lead Channel Setting Pacing Amplitude: 1.75 V
Lead Channel Setting Pacing Amplitude: 2 V
Lead Channel Setting Pacing Pulse Width: 0.4 ms
Lead Channel Setting Pacing Pulse Width: 0.4 ms
Lead Channel Setting Sensing Sensitivity: 0.9 mV

## 2022-01-31 ENCOUNTER — Other Ambulatory Visit: Payer: Self-pay | Admitting: *Deleted

## 2022-01-31 DIAGNOSIS — M0579 Rheumatoid arthritis with rheumatoid factor of multiple sites without organ or systems involvement: Secondary | ICD-10-CM | POA: Diagnosis not present

## 2022-01-31 DIAGNOSIS — Z79899 Other long term (current) drug therapy: Secondary | ICD-10-CM | POA: Diagnosis not present

## 2022-02-01 NOTE — Progress Notes (Signed)
CBC and CMP are normal.  Glucose is mildly elevated, probably not a fasting sample.

## 2022-02-04 NOTE — Progress Notes (Signed)
Remote pacemaker transmission.   

## 2022-02-07 ENCOUNTER — Encounter: Payer: Self-pay | Admitting: Rheumatology

## 2022-02-11 LAB — COMPLETE METABOLIC PANEL WITH GFR
AG Ratio: 1.4 (calc) (ref 1.0–2.5)
ALT: 19 U/L (ref 6–29)
AST: 31 U/L (ref 10–35)
Albumin: 3.8 g/dL (ref 3.6–5.1)
Alkaline phosphatase (APISO): 64 U/L (ref 37–153)
BUN: 16 mg/dL (ref 7–25)
CO2: 29 mmol/L (ref 20–32)
Calcium: 9.3 mg/dL (ref 8.6–10.4)
Chloride: 99 mmol/L (ref 98–110)
Creat: 0.67 mg/dL (ref 0.60–1.00)
Globulin: 2.7 g/dL (calc) (ref 1.9–3.7)
Glucose, Bld: 127 mg/dL — ABNORMAL HIGH (ref 65–99)
Potassium: 4.4 mmol/L (ref 3.5–5.3)
Sodium: 137 mmol/L (ref 135–146)
Total Bilirubin: 0.5 mg/dL (ref 0.2–1.2)
Total Protein: 6.5 g/dL (ref 6.1–8.1)
eGFR: 89 mL/min/{1.73_m2} (ref >=60)

## 2022-02-11 LAB — HYDROXYCHLOROQUINE,BLOOD: HYDROXYCHLOROQUINE, (B): 930 ng/mL — ABNORMAL HIGH

## 2022-02-11 LAB — CBC WITH DIFFERENTIAL/PLATELET
Absolute Monocytes: 845 cells/uL (ref 200–950)
Basophils Absolute: 75 cells/uL (ref 0–200)
Basophils Relative: 0.7 %
Eosinophils Absolute: 332 cells/uL (ref 15–500)
Eosinophils Relative: 3.1 %
HCT: 41 % (ref 35.0–45.0)
Hemoglobin: 14.1 g/dL (ref 11.7–15.5)
Lymphs Abs: 1648 cells/uL (ref 850–3900)
MCH: 33 pg (ref 27.0–33.0)
MCHC: 34.4 g/dL (ref 32.0–36.0)
MCV: 96 fL (ref 80.0–100.0)
MPV: 9.3 fL (ref 7.5–12.5)
Monocytes Relative: 7.9 %
Neutro Abs: 7800 cells/uL (ref 1500–7800)
Neutrophils Relative %: 72.9 %
Platelets: 323 10*3/uL (ref 140–400)
RBC: 4.27 10*6/uL (ref 3.80–5.10)
RDW: 12.5 % (ref 11.0–15.0)
Total Lymphocyte: 15.4 %
WBC: 10.7 10*3/uL (ref 3.8–10.8)

## 2022-02-12 NOTE — Progress Notes (Signed)
Hydroxychloroquine is in therapeutic range.  Patient should stay on the current dose of hydroxychloroquine.

## 2022-02-16 ENCOUNTER — Other Ambulatory Visit: Payer: Self-pay | Admitting: Physician Assistant

## 2022-02-16 DIAGNOSIS — M0579 Rheumatoid arthritis with rheumatoid factor of multiple sites without organ or systems involvement: Secondary | ICD-10-CM

## 2022-02-18 NOTE — Telephone Encounter (Signed)
Next Visit: 05/08/2022 ? ?Last Visit: 11/22/2021 ? ?Labs: 01/31/2022, CBC and CMP are normal.  Glucose is mildly elevated, probably not a fasting sample.Hydroxychloroquine is in therapeutic range.  Patient should stay on the current dose of hydroxychloroquine. ? ?Eye exam: 08/17/2021 WNL f/u 6 months, I called patient, appt scheduled in 3 weeks. ? ?Current Dose per office note 11/22/2021:  Plaquenil 200 mg 1 tablet by mouth twice daily Monday through Friday ? ?DX: Rheumatoid arthritis involving multiple sites with positive rheumatoid factor  ? ?Last Fill: 1/12/223 ? ?Okay to refill Plaquenil? ? ?

## 2022-02-23 ENCOUNTER — Emergency Department (HOSPITAL_COMMUNITY): Payer: Medicare Other

## 2022-02-23 ENCOUNTER — Encounter (HOSPITAL_COMMUNITY): Payer: Self-pay

## 2022-02-23 ENCOUNTER — Inpatient Hospital Stay (HOSPITAL_COMMUNITY)
Admission: EM | Admit: 2022-02-23 | Discharge: 2022-02-26 | DRG: 202 | Disposition: A | Discharge status: Home / Self Care | Payer: Medicare Other | Attending: Internal Medicine | Admitting: Internal Medicine

## 2022-02-23 DIAGNOSIS — J4541 Moderate persistent asthma with (acute) exacerbation: Secondary | ICD-10-CM | POA: Diagnosis not present

## 2022-02-23 DIAGNOSIS — R0902 Hypoxemia: Secondary | ICD-10-CM

## 2022-02-23 DIAGNOSIS — Z79899 Other long term (current) drug therapy: Secondary | ICD-10-CM

## 2022-02-23 DIAGNOSIS — R0602 Shortness of breath: Secondary | ICD-10-CM | POA: Diagnosis not present

## 2022-02-23 DIAGNOSIS — Z9221 Personal history of antineoplastic chemotherapy: Secondary | ICD-10-CM | POA: Diagnosis not present

## 2022-02-23 DIAGNOSIS — Z8616 Personal history of COVID-19: Secondary | ICD-10-CM | POA: Diagnosis not present

## 2022-02-23 DIAGNOSIS — E876 Hypokalemia: Secondary | ICD-10-CM | POA: Diagnosis present

## 2022-02-23 DIAGNOSIS — Z809 Family history of malignant neoplasm, unspecified: Secondary | ICD-10-CM

## 2022-02-23 DIAGNOSIS — I10 Essential (primary) hypertension: Secondary | ICD-10-CM | POA: Diagnosis present

## 2022-02-23 DIAGNOSIS — Z950 Presence of cardiac pacemaker: Secondary | ICD-10-CM | POA: Diagnosis not present

## 2022-02-23 DIAGNOSIS — J4551 Severe persistent asthma with (acute) exacerbation: Secondary | ICD-10-CM | POA: Diagnosis not present

## 2022-02-23 DIAGNOSIS — J440 Chronic obstructive pulmonary disease with acute lower respiratory infection: Secondary | ICD-10-CM | POA: Diagnosis not present

## 2022-02-23 DIAGNOSIS — F411 Generalized anxiety disorder: Secondary | ICD-10-CM | POA: Diagnosis present

## 2022-02-23 DIAGNOSIS — J9601 Acute respiratory failure with hypoxia: Secondary | ICD-10-CM | POA: Diagnosis present

## 2022-02-23 DIAGNOSIS — R0981 Nasal congestion: Secondary | ICD-10-CM | POA: Diagnosis not present

## 2022-02-23 DIAGNOSIS — Z823 Family history of stroke: Secondary | ICD-10-CM

## 2022-02-23 DIAGNOSIS — Z888 Allergy status to other drugs, medicaments and biological substances status: Secondary | ICD-10-CM | POA: Diagnosis not present

## 2022-02-23 DIAGNOSIS — R778 Other specified abnormalities of plasma proteins: Secondary | ICD-10-CM | POA: Diagnosis not present

## 2022-02-23 DIAGNOSIS — Z87891 Personal history of nicotine dependence: Secondary | ICD-10-CM | POA: Diagnosis not present

## 2022-02-23 DIAGNOSIS — Z743 Need for continuous supervision: Secondary | ICD-10-CM | POA: Diagnosis not present

## 2022-02-23 DIAGNOSIS — R6889 Other general symptoms and signs: Secondary | ICD-10-CM | POA: Diagnosis not present

## 2022-02-23 DIAGNOSIS — K219 Gastro-esophageal reflux disease without esophagitis: Secondary | ICD-10-CM | POA: Diagnosis not present

## 2022-02-23 DIAGNOSIS — I252 Old myocardial infarction: Secondary | ICD-10-CM | POA: Diagnosis not present

## 2022-02-23 DIAGNOSIS — J9600 Acute respiratory failure, unspecified whether with hypoxia or hypercapnia: Secondary | ICD-10-CM | POA: Diagnosis not present

## 2022-02-23 DIAGNOSIS — R7989 Other specified abnormal findings of blood chemistry: Secondary | ICD-10-CM | POA: Diagnosis present

## 2022-02-23 DIAGNOSIS — M0579 Rheumatoid arthritis with rheumatoid factor of multiple sites without organ or systems involvement: Secondary | ICD-10-CM | POA: Diagnosis not present

## 2022-02-23 DIAGNOSIS — Z853 Personal history of malignant neoplasm of breast: Secondary | ICD-10-CM

## 2022-02-23 DIAGNOSIS — J441 Chronic obstructive pulmonary disease with (acute) exacerbation: Principal | ICD-10-CM

## 2022-02-23 DIAGNOSIS — J45909 Unspecified asthma, uncomplicated: Secondary | ICD-10-CM | POA: Diagnosis present

## 2022-02-23 DIAGNOSIS — I2510 Atherosclerotic heart disease of native coronary artery without angina pectoris: Secondary | ICD-10-CM | POA: Diagnosis not present

## 2022-02-23 DIAGNOSIS — U071 COVID-19: Secondary | ICD-10-CM | POA: Diagnosis not present

## 2022-02-23 DIAGNOSIS — Z885 Allergy status to narcotic agent status: Secondary | ICD-10-CM

## 2022-02-23 DIAGNOSIS — J208 Acute bronchitis due to other specified organisms: Secondary | ICD-10-CM | POA: Diagnosis not present

## 2022-02-23 DIAGNOSIS — I700 Atherosclerosis of aorta: Secondary | ICD-10-CM | POA: Diagnosis not present

## 2022-02-23 DIAGNOSIS — E785 Hyperlipidemia, unspecified: Secondary | ICD-10-CM | POA: Diagnosis not present

## 2022-02-23 DIAGNOSIS — J45901 Unspecified asthma with (acute) exacerbation: Secondary | ICD-10-CM | POA: Diagnosis present

## 2022-02-23 DIAGNOSIS — Z7951 Long term (current) use of inhaled steroids: Secondary | ICD-10-CM | POA: Diagnosis not present

## 2022-02-23 DIAGNOSIS — R509 Fever, unspecified: Secondary | ICD-10-CM | POA: Diagnosis not present

## 2022-02-23 DIAGNOSIS — Z7982 Long term (current) use of aspirin: Secondary | ICD-10-CM | POA: Diagnosis not present

## 2022-02-23 DIAGNOSIS — Z923 Personal history of irradiation: Secondary | ICD-10-CM | POA: Diagnosis not present

## 2022-02-23 DIAGNOSIS — J8410 Pulmonary fibrosis, unspecified: Secondary | ICD-10-CM | POA: Diagnosis not present

## 2022-02-23 DIAGNOSIS — I499 Cardiac arrhythmia, unspecified: Secondary | ICD-10-CM | POA: Diagnosis not present

## 2022-02-23 DIAGNOSIS — R5383 Other fatigue: Secondary | ICD-10-CM | POA: Diagnosis not present

## 2022-02-23 LAB — CBC WITH DIFFERENTIAL/PLATELET
Abs Immature Granulocytes: 0.04 10*3/uL (ref 0.00–0.07)
Basophils Absolute: 0.1 10*3/uL (ref 0.0–0.1)
Basophils Relative: 0 %
Eosinophils Absolute: 0 10*3/uL (ref 0.0–0.5)
Eosinophils Relative: 0 %
HCT: 43.2 % (ref 36.0–46.0)
Hemoglobin: 14.8 g/dL (ref 12.0–15.0)
Immature Granulocytes: 0 %
Lymphocytes Relative: 27 %
Lymphs Abs: 3.1 10*3/uL (ref 0.7–4.0)
MCH: 32.9 pg (ref 26.0–34.0)
MCHC: 34.3 g/dL (ref 30.0–36.0)
MCV: 96 fL (ref 80.0–100.0)
Monocytes Absolute: 1.1 10*3/uL — ABNORMAL HIGH (ref 0.1–1.0)
Monocytes Relative: 10 %
Neutro Abs: 7 10*3/uL (ref 1.7–7.7)
Neutrophils Relative %: 63 %
Platelets: 249 10*3/uL (ref 150–400)
RBC: 4.5 MIL/uL (ref 3.87–5.11)
RDW: 13.9 % (ref 11.5–15.5)
WBC: 11.3 10*3/uL — ABNORMAL HIGH (ref 4.0–10.5)
nRBC: 0 % (ref 0.0–0.2)

## 2022-02-23 LAB — BLOOD GAS, VENOUS
Acid-Base Excess: 1.5 mmol/L (ref 0.0–2.0)
Bicarbonate: 27.7 mmol/L (ref 20.0–28.0)
O2 Saturation: 51.5 %
Patient temperature: 37
pCO2, Ven: 49 mmHg (ref 44–60)
pH, Ven: 7.36 (ref 7.25–7.43)
pO2, Ven: <31 mmHg — CL (ref 32–45)

## 2022-02-23 LAB — COMPREHENSIVE METABOLIC PANEL
ALT: 28 U/L (ref 0–44)
AST: 51 U/L — ABNORMAL HIGH (ref 15–41)
Albumin: 3.3 g/dL — ABNORMAL LOW (ref 3.5–5.0)
Alkaline Phosphatase: 53 U/L (ref 38–126)
Anion gap: 10 (ref 5–15)
BUN: 9 mg/dL (ref 8–23)
CO2: 25 mmol/L (ref 22–32)
Calcium: 8.9 mg/dL (ref 8.9–10.3)
Chloride: 100 mmol/L (ref 98–111)
Creatinine, Ser: 0.65 mg/dL (ref 0.44–1.00)
GFR, Estimated: >60 mL/min (ref >60)
Glucose, Bld: 117 mg/dL — ABNORMAL HIGH (ref 70–99)
Potassium: 3.3 mmol/L — ABNORMAL LOW (ref 3.5–5.1)
Sodium: 135 mmol/L (ref 135–145)
Total Bilirubin: 0.8 mg/dL (ref 0.3–1.2)
Total Protein: 6.8 g/dL (ref 6.5–8.1)

## 2022-02-23 LAB — TROPONIN I (HIGH SENSITIVITY)
Troponin I (High Sensitivity): 78 ng/L — ABNORMAL HIGH (ref <18)
Troponin I (High Sensitivity): 64 ng/L — ABNORMAL HIGH (ref <18)

## 2022-02-23 LAB — C-REACTIVE PROTEIN: CRP: 3.2 mg/dL — ABNORMAL HIGH (ref <1.0)

## 2022-02-23 LAB — RESP PANEL BY RT-PCR (FLU A&B, COVID) ARPGX2
Influenza A by PCR: NEGATIVE
Influenza B by PCR: NEGATIVE
SARS Coronavirus 2 by RT PCR: NEGATIVE

## 2022-02-23 LAB — PROCALCITONIN: Procalcitonin: <0.1 ng/mL

## 2022-02-23 LAB — D-DIMER, QUANTITATIVE: D-Dimer, Quant: 0.94 ug/mL-FEU — ABNORMAL HIGH (ref 0.00–0.50)

## 2022-02-23 LAB — TRIGLYCERIDES: Triglycerides: 61 mg/dL (ref <150)

## 2022-02-23 LAB — FIBRINOGEN: Fibrinogen: 488 mg/dL — ABNORMAL HIGH (ref 210–475)

## 2022-02-23 LAB — LACTIC ACID, PLASMA
Lactic Acid, Venous: 2.6 mmol/L (ref 0.5–1.9)
Lactic Acid, Venous: 2 mmol/L (ref 0.5–1.9)
Lactic Acid, Venous: 2.2 mmol/L (ref 0.5–1.9)

## 2022-02-23 LAB — FERRITIN: Ferritin: 63 ng/mL (ref 11–307)

## 2022-02-23 LAB — LACTATE DEHYDROGENASE: LDH: 260 U/L — ABNORMAL HIGH (ref 98–192)

## 2022-02-23 MED ORDER — PANTOPRAZOLE SODIUM 40 MG PO TBEC
40.0000 mg | DELAYED_RELEASE_TABLET | Freq: Every day | ORAL | Status: DC
  Administered 2022-02-24 – 2022-02-26 (×3): 40 mg via ORAL
  Filled 2022-02-23 (×3): qty 1

## 2022-02-23 MED ORDER — TRAZODONE HCL 50 MG PO TABS
50.0000 mg | ORAL_TABLET | Freq: Every day | ORAL | Status: DC | PRN
  Filled 2022-02-23: qty 1

## 2022-02-23 MED ORDER — ALBUTEROL SULFATE HFA 108 (90 BASE) MCG/ACT IN AERS
4.0000 | INHALATION_SPRAY | Freq: Once | RESPIRATORY_TRACT | Status: AC
  Administered 2022-02-23: 4 via RESPIRATORY_TRACT
  Filled 2022-02-23: qty 6.7

## 2022-02-23 MED ORDER — ENOXAPARIN SODIUM 40 MG/0.4ML IJ SOSY
40.0000 mg | PREFILLED_SYRINGE | INTRAMUSCULAR | Status: DC
  Administered 2022-02-23 – 2022-02-25 (×3): 40 mg via SUBCUTANEOUS
  Filled 2022-02-23 (×2): qty 0.4

## 2022-02-23 MED ORDER — IPRATROPIUM BROMIDE 0.02 % IN SOLN
0.5000 mg | Freq: Four times a day (QID) | RESPIRATORY_TRACT | Status: DC
  Administered 2022-02-23 – 2022-02-26 (×10): 0.5 mg via RESPIRATORY_TRACT
  Filled 2022-02-23 (×10): qty 2.5

## 2022-02-23 MED ORDER — METHYLPREDNISOLONE SODIUM SUCC 40 MG IJ SOLR
40.0000 mg | Freq: Four times a day (QID) | INTRAMUSCULAR | Status: AC
  Administered 2022-02-23 – 2022-02-24 (×4): 40 mg via INTRAVENOUS
  Filled 2022-02-23 (×4): qty 1

## 2022-02-23 MED ORDER — LOSARTAN POTASSIUM 50 MG PO TABS
50.0000 mg | ORAL_TABLET | Freq: Every day | ORAL | Status: DC
  Administered 2022-02-24 – 2022-02-26 (×3): 50 mg via ORAL
  Filled 2022-02-23 (×3): qty 1

## 2022-02-23 MED ORDER — CLONAZEPAM 0.5 MG PO TABS
0.5000 mg | ORAL_TABLET | Freq: Two times a day (BID) | ORAL | Status: DC | PRN
  Administered 2022-02-24 – 2022-02-25 (×3): 0.5 mg via ORAL
  Filled 2022-02-23 (×3): qty 1

## 2022-02-23 MED ORDER — MOMETASONE FURO-FORMOTEROL FUM 200-5 MCG/ACT IN AERO
2.0000 | INHALATION_SPRAY | Freq: Two times a day (BID) | RESPIRATORY_TRACT | Status: DC
Start: 2022-02-23 — End: 2022-02-26
  Administered 2022-02-24 – 2022-02-26 (×5): 2 via RESPIRATORY_TRACT
  Filled 2022-02-23: qty 8.8

## 2022-02-23 MED ORDER — HYDROCHLOROTHIAZIDE 12.5 MG PO TABS
12.5000 mg | ORAL_TABLET | Freq: Every day | ORAL | Status: DC
  Administered 2022-02-25 – 2022-02-26 (×2): 12.5 mg via ORAL
  Filled 2022-02-23 (×5): qty 1

## 2022-02-23 MED ORDER — DOXYCYCLINE HYCLATE 100 MG PO TABS
100.0000 mg | ORAL_TABLET | Freq: Two times a day (BID) | ORAL | Status: DC
  Administered 2022-02-23 – 2022-02-26 (×7): 100 mg via ORAL
  Filled 2022-02-23 (×7): qty 1

## 2022-02-23 MED ORDER — ATORVASTATIN CALCIUM 40 MG PO TABS
40.0000 mg | ORAL_TABLET | Freq: Every day | ORAL | Status: DC
  Administered 2022-02-24 – 2022-02-26 (×3): 40 mg via ORAL
  Filled 2022-02-23 (×3): qty 1

## 2022-02-23 MED ORDER — PHENOL 1.4 % MT LIQD
1.0000 | OROMUCOSAL | Status: DC | PRN
  Administered 2022-02-24: 1 via OROMUCOSAL
  Filled 2022-02-23: qty 177

## 2022-02-23 MED ORDER — ASPIRIN 81 MG PO CHEW
81.0000 mg | CHEWABLE_TABLET | Freq: Every day | ORAL | Status: DC
  Administered 2022-02-23 – 2022-02-26 (×4): 81 mg via ORAL
  Filled 2022-02-23 (×4): qty 1

## 2022-02-23 MED ORDER — PREDNISONE 20 MG PO TABS
40.0000 mg | ORAL_TABLET | Freq: Every day | ORAL | Status: DC
  Administered 2022-02-25 – 2022-02-26 (×2): 40 mg via ORAL
  Filled 2022-02-23 (×2): qty 2

## 2022-02-23 MED ORDER — GUAIFENESIN ER 600 MG PO TB12
1200.0000 mg | ORAL_TABLET | Freq: Two times a day (BID) | ORAL | Status: DC
  Administered 2022-02-23 – 2022-02-26 (×6): 1200 mg via ORAL
  Filled 2022-02-23 (×6): qty 2

## 2022-02-23 MED ORDER — LORATADINE 10 MG PO TABS
10.0000 mg | ORAL_TABLET | Freq: Every day | ORAL | Status: DC
  Administered 2022-02-24 – 2022-02-26 (×3): 10 mg via ORAL
  Filled 2022-02-23 (×3): qty 1

## 2022-02-23 MED ORDER — BENZONATATE 100 MG PO CAPS
100.0000 mg | ORAL_CAPSULE | Freq: Two times a day (BID) | ORAL | Status: DC
  Administered 2022-02-23 – 2022-02-26 (×6): 100 mg via ORAL
  Filled 2022-02-23 (×6): qty 1

## 2022-02-23 MED ORDER — LEVALBUTEROL HCL 0.63 MG/3ML IN NEBU
0.6300 mg | INHALATION_SOLUTION | Freq: Four times a day (QID) | RESPIRATORY_TRACT | Status: DC | PRN
  Administered 2022-02-23 – 2022-02-24 (×3): 0.63 mg via RESPIRATORY_TRACT
  Filled 2022-02-23 (×3): qty 3

## 2022-02-23 MED ORDER — POTASSIUM CHLORIDE CRYS ER 20 MEQ PO TBCR
40.0000 meq | EXTENDED_RELEASE_TABLET | Freq: Once | ORAL | Status: AC
  Administered 2022-02-23: 40 meq via ORAL
  Filled 2022-02-23: qty 2

## 2022-02-23 MED ORDER — MAGNESIUM SULFATE 2 GM/50ML IV SOLN
2.0000 g | Freq: Once | INTRAVENOUS | Status: AC
  Administered 2022-02-23: 2 g via INTRAVENOUS
  Filled 2022-02-23: qty 50

## 2022-02-23 MED ORDER — CITALOPRAM HYDROBROMIDE 20 MG PO TABS
20.0000 mg | ORAL_TABLET | Freq: Every day | ORAL | Status: DC
  Administered 2022-02-24 – 2022-02-26 (×3): 20 mg via ORAL
  Filled 2022-02-23: qty 2
  Filled 2022-02-23 (×3): qty 1

## 2022-02-23 MED ORDER — AEROCHAMBER PLUS FLO-VU LARGE MISC: 1.0000 | Freq: Once | Status: DC

## 2022-02-23 NOTE — H&P (Signed)
?History and Physical  ? ? ?[name] GTX:[id number] DOB: August 04, 1946 DOA: 02/23/2022 ? ?PCP: Glenis Smoker, MD (Confirm with patient/family/NH records and if not entered, this has to be entered at [facility] point of entry) ?Patient coming from: Home ? ?I have personally briefly reviewed patient's old medical records in [place] ? ?Chief Complaint: Cough, wheezing SOB ? ?HPI: [name] is a 76 y.o. female with medical history significant of mild intermittent asthma, HTN, CAD with MI status post LAD thrombectomy but no stenting in 2017, RA on DMARDs of MTX and hydroxychloroquine, GERD, HLD, complete heart block status post PPM, came in with worsening of productive cough, wheezing, shortness of breath. ? ?Patient started to have flulike symptoms of runny nose, cough with thick yellowish sputum, wheezing about 1 week ago along with low-grade fever for 1 day on Thursday with Tmax 100.8.  Family did home COVID test 2 times on Tuesday and Thursday and both were negative.  Denies any chest pain.  Last 2 days started worsening of exertional dyspnea.  Went to see urgent care this morning, O2 saturation dropped to 85% with minimal movement, rapid COVID test positive at urgent care. ? ?ED Course: Tachypneic and borderline hypoxic, stabilized on 2 L oxygen.  Chest x-ray negative for acute infiltrates. ? ?WBC 11, Troponin 78 first set. ? ?Review of Systems: As per HPI otherwise 14 point review of systems negative.  ? ? ?Past Medical History:  ?Diagnosis Date  ? Anxiety   ? Asthma   ? Breast cancer ([place])   ? left breast  ? Carpal tunnel syndrome   ? Depression   ? Habitual alcohol use   ? Heart attack ([place])   ? Hypertension   ? Personal history of chemotherapy   ? Personal history of radiation therapy   ? RA (rheumatoid arthritis) ([place])   ? Spinal stenosis   ? Vision abnormalities   ? ? ?Past Surgical History:  ?Procedure Laterality Date  ? BREAST BIOPSY    ? BREAST LUMPECTOMY  2005  ? left  ? CARDIAC  CATHETERIZATION N/A 07/04/2016  ? Procedure: Left Heart Cath and Coronary Angiography;  Surgeon: Belva Crome, MD;  Location: [facility];  Service: Cardiovascular;  Laterality: N/A;  ? CARDIAC CATHETERIZATION N/A 07/04/2016  ? Procedure: Temporary Pacemaker;  Surgeon: Belva Crome, MD;  Location: [facility];  Service: Cardiovascular;  Laterality: N/A;  ? CARPAL TUNNEL RELEASE    ? CATARACT EXTRACTION Bilateral   ? EP IMPLANTABLE DEVICE N/A 07/05/2016  ? Procedure: Pacemaker Implant;  Surgeon: Evans Lance, MD;  Location: [facility];  Service: Cardiovascular;  Laterality: N/A;  ? herniated rectum repair   11/2020  ? LAMINECTOMY  1966  ? LYMPHECTOMY  2005  ? ? ? reports that she quit smoking about 28 years ago. Her smoking use included cigarettes. She has a 45.00 pack-year smoking history. She has never used smokeless tobacco. She reports current alcohol use of about 14.0 standard drinks per week. She reports that she does not use drugs. ? ?Allergies  ?Allergen Reactions  ? Betadine [Povidone Iodine] Shortness Of Breath  ? Hydrocodone Itching  ? ? ?Family History  ?Problem Relation Age of Onset  ? Stroke Mother   ? Cancer Father   ?     colon?  ? Pyelonephritis Sister   ? Cancer Paternal Aunt   ? Cancer Paternal Uncle   ? ? ? ?Prior to Admission medications   ?  Medication Sig Start Date End Date Taking? Authorizing Provider  ?albuterol (PROVENTIL HFA;VENTOLIN HFA) 108 (90 Base) MCG/ACT inhaler Inhale into the lungs every 6 (six) hours as needed for wheezing or shortness of breath.    [provider]  ?aspirin 81 MG tablet Take 81 mg by mouth daily.    [provider]  ?atorvastatin (LIPITOR) 40 MG tablet TAKE 1 TABLET(40 MG) BY MOUTH DAILY AT 6 PM 09/05/21   Belva Crome, MD  ?budesonide-formoterol [facility]) 160-4.5 MCG/ACT inhaler Inhale 2 puffs into the lungs 2 (two) times daily.    [provider]  ?calcium carbonate (OS-CAL) 600 MG TABS Take 600 mg by mouth 2  (two) times daily with a meal.    [provider]  ?citalopram (CELEXA) 20 MG tablet Take 20 mg by mouth daily. 04/28/17   [provider]  ?clonazePAM (KLONOPIN) 0.5 MG tablet Take 0.5 mg by mouth every 12 (twelve) hours as needed for anxiety. 06/12/16   [provider]  ?dicyclomine (BENTYL) 20 MG tablet TK 1 T PO TID B MEALS PRN ?Patient not taking: Reported on 11/22/2021 03/25/19   [provider]  ?Ergocalciferol (VITAMIN D2) 2000 units TABS Take 2,000 Units by mouth daily.    [provider]  ?folic acid (FOLVITE) 1 MG tablet TAKE 1 TABLET BY MOUTH DAILY 11/05/21   Ofilia Neas, PA-C  ?hydrochlorothiazide (MICROZIDE) 12.5 MG capsule Take 1 tablet by mouth 3 times weekly as needed for lower extremity swelling. 08/20/21   Isaiah Serge, NP  ?hydroxychloroquine (PLAQUENIL) 200 MG tablet TAKE 1 TABLET BY MOUTH TWICE DAILY MONDAY THROUGH FRIDAY ONLY 02/18/22   Ofilia Neas, PA-C  ?losartan (COZAAR) 50 MG tablet TAKE 1 TABLET(50 MG) BY MOUTH DAILY 11/21/21   Evans Lance, MD  ?methotrexate (RHEUMATREX) 2.5 MG tablet Take 4 tablets by mouth once weekly. Caution:Chemotherapy. Protect from light. 01/17/22   Ofilia Neas, PA-C  ?nitroGLYCERIN (NITROSTAT) 0.4 MG SL tablet Place 1 tablet (0.4 mg total) under the tongue every 5 (five) minutes as needed for chest pain. 05/24/21   Isaiah Serge, NP  ?pantoprazole (PROTONIX) 40 MG tablet TAKE 1 TABLET(40 MG) BY MOUTH DAILY 11/01/21   Belva Crome, MD  ?traZODone (DESYREL) 50 MG tablet Take 50 mg by mouth daily as needed. 07/25/20   [provider]  ? ? ?Physical Exam: ?Vitals:  ? 02/23/22 1317 02/23/22 1333 02/23/22 1455 02/23/22 1528  ?BP: 133/60  140/76 (!) 144/83  ?Pulse: (!) 102  100 97  ?Resp: (!) 22  (!) 30 (!) 22  ?Temp:  98 ?F (36.7 ?C)    ?TempSrc:  Oral    ?SpO2: 100%  93% 96%  ?Weight:      ?Height:      ? ? ?Constitutional: NAD, calm, comfortable ?Vitals:  ? 02/23/22 1317 02/23/22 1333 02/23/22 1455 02/23/22 1528   ?BP: 133/60  140/76 (!) 144/83  ?Pulse: (!) 102  100 97  ?Resp: (!) 22  (!) 30 (!) 22  ?Temp:  98 ?F (36.7 ?C)    ?TempSrc:  Oral    ?SpO2: 100%  93% 96%  ?Weight:      ?Height:      ? ?Eyes: PERRL, lids and conjunctivae normal ?ENMT: Mucous membranes are moist. Posterior pharynx clear of any exudate or lesions.Normal dentition.  ?Neck: normal, supple, no masses, no thyromegaly ?Respiratory: Diminished breathing sound bilaterally, diffused wheezing no crackles, increasing breathing effort, speaking in broken sentences.  No accessory  muscle use.  ?Cardiovascular: Regular rate and rhythm, no murmurs / rubs / gallops. No extremity edema. 2+ pedal pulses. No carotid bruits.  ?Abdomen: no tenderness, no masses palpated. No hepatosplenomegaly. Bowel sounds positive.  ?Musculoskeletal: no clubbing / cyanosis. No joint deformity upper and lower extremities. Good ROM, no contractures. Normal muscle tone.  ?Skin: no rashes, lesions, ulcers. No induration ?Neurologic: CN 2-12 grossly intact. Sensation intact, DTR normal. Strength 5/5 in all 4.  ?Psychiatric: Normal judgment and insight. Alert and oriented x 3. Normal mood.  ? ? ? ?Labs on Admission: I have personally reviewed following labs and imaging studies ? ?CBC: ?Recent Labs  ?Lab 02/23/22 ?1310  ?WBC 11.3*  ?NEUTROABS 7.0  ?HGB 14.8  ?HCT 43.2  ?MCV 96.0  ?PLT 249  ? ?Basic Metabolic Panel: ?Recent Labs  ?Lab 02/23/22 ?1310  ?NA 135  ?K 3.3*  ?CL 100  ?CO2 25  ?GLUCOSE 117*  ?BUN 9  ?CREATININE 0.65  ?CALCIUM 8.9  ? ?GFR: ?Estimated Creatinine Clearance: 49.9 mL/min (by C-G formula based on SCr of 0.65 mg/dL). ?Liver Function Tests: ?Recent Labs  ?Lab 02/23/22 ?1310  ?AST 51*  ?ALT 28  ?ALKPHOS 53  ?BILITOT 0.8  ?PROT 6.8  ?ALBUMIN 3.3*  ? ?No results for input(s): LIPASE, AMYLASE in the last 168 hours. ?No results for input(s): AMMONIA in the last 168 hours. ?Coagulation Profile: ?No results for input(s): INR, PROTIME in the last 168 hours. ?Cardiac Enzymes: ?No  results for input(s): CKTOTAL, CKMB, CKMBINDEX, TROPONINI in the last 168 hours. ?BNP (last 3 results) ?No results for input(s): PROBNP in the last 8760 hours. ?HbA1C: ?No results for input(s): HGBA1C in the l

## 2022-02-23 NOTE — Progress Notes (Signed)
Trop trending 78>64, no chest pain, EKG paced.  ?Likely demanding ischemia from hypoxia and asthma exacerbation.  ?

## 2022-02-23 NOTE — ED Triage Notes (Signed)
PT BIB [facility] for eval of fever, fatigue and SOB. Pt coming from UC after testing positive for covid. 91% on RA on EMS arrival, rec'd 125 solumedrol, '10mg'$  albuterol and 1 mg atrovent en route. EMS reports desat to 87% on exertion ? ?

## 2022-02-23 NOTE — ED Provider Notes (Signed)
?[facility] ?Provider Note ? ? ?CSN: [id number] ?Arrival date & time: 02/23/22  1303 ? ?  ? ?History ? ?Chief Complaint  ?Patient presents with  ? Shortness of Breath  ? ? ?[name] is a 76 y.o. female. ? ?HPI ?76 year old female with a history of asthma, pacemaker, hypertension, breast cancer presents with shortness of breath and COVID.  She has been feeling poorly since 4/2.  Starting a couple days ago she developed fever and has had a dry cough.  She is also having rhinorrhea.  She has been having worsening shortness of breath and so she went to urgent care where she tested positive for COVID on a point-of-care test and was found to be hypoxic into the high 80s.  EMS gave her IV Solu-Medrol and 10 mg albuterol along with some Atrovent.  She is feeling a little better now. ? ?Home Medications ?Prior to Admission medications   ?Medication Sig Start Date End Date Taking? Authorizing Provider  ?albuterol (PROVENTIL HFA;VENTOLIN HFA) 108 (90 Base) MCG/ACT inhaler Inhale into the lungs every 6 (six) hours as needed for wheezing or shortness of breath.    [provider]  ?aspirin 81 MG tablet Take 81 mg by mouth daily.    [provider]  ?atorvastatin (LIPITOR) 40 MG tablet TAKE 1 TABLET(40 MG) BY MOUTH DAILY AT 6 PM 09/05/21   Belva Crome, MD  ?budesonide-formoterol [facility]) 160-4.5 MCG/ACT inhaler Inhale 2 puffs into the lungs 2 (two) times daily.    [provider]  ?calcium carbonate (OS-CAL) 600 MG TABS Take 600 mg by mouth 2 (two) times daily with a meal.    [provider]  ?citalopram (CELEXA) 20 MG tablet Take 20 mg by mouth daily. 04/28/17   [provider]  ?clonazePAM (KLONOPIN) 0.5 MG tablet Take 0.5 mg by mouth every 12 (twelve) hours as needed for anxiety. 06/12/16   [provider]  ?dicyclomine (BENTYL) 20 MG tablet TK 1 T PO TID B MEALS PRN ?Patient not taking: Reported on 11/22/2021 03/25/19    [provider]  ?Ergocalciferol (VITAMIN D2) 2000 units TABS Take 2,000 Units by mouth daily.    [provider]  ?folic acid (FOLVITE) 1 MG tablet TAKE 1 TABLET BY MOUTH DAILY 11/05/21   Ofilia Neas, PA-C  ?hydrochlorothiazide (MICROZIDE) 12.5 MG capsule Take 1 tablet by mouth 3 times weekly as needed for lower extremity swelling. 08/20/21   Isaiah Serge, NP  ?hydroxychloroquine (PLAQUENIL) 200 MG tablet TAKE 1 TABLET BY MOUTH TWICE DAILY MONDAY THROUGH FRIDAY ONLY 02/18/22   Ofilia Neas, PA-C  ?losartan (COZAAR) 50 MG tablet TAKE 1 TABLET(50 MG) BY MOUTH DAILY 11/21/21   Evans Lance, MD  ?methotrexate (RHEUMATREX) 2.5 MG tablet Take 4 tablets by mouth once weekly. Caution:Chemotherapy. Protect from light. 01/17/22   Ofilia Neas, PA-C  ?nitroGLYCERIN (NITROSTAT) 0.4 MG SL tablet Place 1 tablet (0.4 mg total) under the tongue every 5 (five) minutes as needed for chest pain. 05/24/21   Isaiah Serge, NP  ?pantoprazole (PROTONIX) 40 MG tablet TAKE 1 TABLET(40 MG) BY MOUTH DAILY 11/01/21   Belva Crome, MD  ?traZODone (DESYREL) 50 MG tablet Take 50 mg by mouth daily as needed. 07/25/20   [provider]  ?   ? ?Allergies    ?Betadine [povidone iodine] and Hydrocodone   ? ?Review of Systems   ?Review of Systems  ?Constitutional:  Positive for fever.  ?HENT:  Positive for rhinorrhea.   ?Respiratory:  Positive for cough and shortness of breath.   ?Cardiovascular:  Positive for chest pain (with coughing).  ? ?Physical Exam ?Updated Vital Signs ?BP 140/76   Pulse 100   Temp 98 ?F (36.7 ?C) (Oral)   Resp (!) 30   Ht '5\' 2"'$  (1.575 m)   Wt 61 kg   SpO2 93%   BMI 24.60 kg/m?  ?Physical Exam ?Vitals and nursing note reviewed.  ?Constitutional:   ?   Appearance: She is well-developed.  ?HENT:  ?   Head: Normocephalic and atraumatic.  ?Cardiovascular:  ?   Rate and Rhythm: Normal rate and regular rhythm.  ?   Heart sounds: Normal heart sounds.  ?   Comments: HR~100 ?Pulmonary:  ?    Effort: Pulmonary effort is normal. Tachypnea present. No accessory muscle usage or respiratory distress.  ?   Breath sounds: Wheezing (diffuse, expiratory) present.  ?Abdominal:  ?   Palpations: Abdomen is soft.  ?   Tenderness: There is no abdominal tenderness.  ?Skin: ?   General: Skin is warm and dry.  ?Neurological:  ?   Mental Status: She is alert.  ? ? ?ED Results / Procedures / Treatments   ?Labs ?(all labs ordered are listed, but only abnormal results are displayed) ?Labs Reviewed  ?LACTIC ACID, PLASMA - Abnormal; Notable for the following components:  ?    Result Value  ? Lactic Acid, Venous 2.6 (*)   ? All other components within normal limits  ?CBC WITH DIFFERENTIAL/PLATELET - Abnormal; Notable for the following components:  ? WBC 11.3 (*)   ? Monocytes Absolute 1.1 (*)   ? All other components within normal limits  ?COMPREHENSIVE METABOLIC PANEL - Abnormal; Notable for the following components:  ? Potassium 3.3 (*)   ? Glucose, Bld 117 (*)   ? Albumin 3.3 (*)   ? AST 51 (*)   ? All other components within normal limits  ?D-DIMER, QUANTITATIVE - Abnormal; Notable for the following components:  ? D-Dimer, Quant 0.94 (*)   ? All other components within normal limits  ?LACTATE DEHYDROGENASE - Abnormal; Notable for the following components:  ? LDH 260 (*)   ? All other components within normal limits  ?FIBRINOGEN - Abnormal; Notable for the following components:  ? Fibrinogen 488 (*)   ? All other components within normal limits  ?C-REACTIVE PROTEIN - Abnormal; Notable for the following components:  ? CRP 3.2 (*)   ? All other components within normal limits  ?TROPONIN I (HIGH SENSITIVITY) - Abnormal; Notable for the following components:  ? Troponin I (High Sensitivity) 78 (*)   ? All other components within normal limits  ?RESP PANEL BY RT-PCR (FLU A&B, COVID) [id number]  ?CULTURE, BLOOD (ROUTINE X 2)  ?CULTURE, BLOOD (ROUTINE X 2)  ?FERRITIN  ?LACTIC ACID, PLASMA  ?PROCALCITONIN  ?TRIGLYCERIDES  ?TROPONIN I  (HIGH SENSITIVITY)  ? ? ?EKG ?EKG Interpretation ? ?Date/Time:  Saturday February 23 2022 13:19:43 EDT ?Ventricular Rate:  94 ?PR Interval:  115 ?QRS Duration: 151 ?QT Interval:  433 ?QTC Calculation: 542 ?R Axis:   257 ?Text Interpretation: Sinus rhythm Borderline short PR interval Nonspecific IVCD with LAD Consider left ventricular hypertrophy similar to earlier in the day Confirmed by Sherwood Gambler [phone]) on 02/23/2022 1:22:59 PM ? ?Radiology ?DG Chest Portable 1 View ? ?Result Date: 02/23/2022 ?CLINICAL DATA:  Shortness of breath, COVID EXAM: PORTABLE CHEST 1 VIEW COMPARISON:  12/13/2017 FINDINGS: The heart size is normal. Left  chest multi lead pacer. Unchanged elevation of the left hemidiaphragm. The visualized skeletal structures are unremarkable. IMPRESSION: No acute abnormality of the lungs. Unchanged elevation of the left hemidiaphragm. Electronically Signed   By: Delanna Ahmadi M.D.   On: 02/23/2022 13:50   ? ?Procedures ?Procedures  ? ? ?Medications Ordered in ED ?Medications  ?magnesium sulfate IVPB 2 g 50 mL (2 g Intravenous New Bag/Given 02/23/22 1430)  ?AeroChamber Plus Flo-Vu Large MISC 1 each (0 each Other Hold 02/23/22 1456)  ?albuterol (VENTOLIN HFA) 108 (90 Base) MCG/ACT inhaler 4 puff (4 puffs Inhalation Given 02/23/22 1430)  ? ? ?ED Course/ Medical Decision Making/ A&P ?  ?                        ?Medical Decision Making ?Amount and/or Complexity of Data Reviewed ?Labs: ordered. ?Radiology: ordered. ? ?Risk ?Prescription drug management. ?Decision regarding hospitalization. ? ? ?Patient has acute bronchospasm.  Unclear if is truly from [place] given her COVID PCR is negative but her point-of-care at the urgent care earlier today was positive on chart review.  Either way, she is improving with albuterol from both EMS and is here.  She was given Solu-Medrol by them and IV magnesium here.  No bacterial pneumonia on chest x-ray images viewed by myself.  Labs show mild leukocytosis but there is no obvious  bacterial pneumonia.  Troponin elevation is probably more likely from hypoxia rather than ACS given the chest pain is only with coughing.  Lactate being elevated could be from sepsis but is more likely from receiving 10 mg alb

## 2022-02-23 NOTE — ED Notes (Signed)
ED TO INPATIENT HANDOFF REPORT  ED Nurse Name and Phone #: Mikki Harbor Name/Age/Gender [name] 76 y.o. female Room/Bed: 016C/016C  Code Status   Code Status: Full Code  Home/SNF/Other Home Patient oriented to: self, place, time, and situation Is this baseline? Yes   Triage Complete: Triage complete  Chief Complaint Asthma [J45.909]  Triage Note PT BIB [facility] for eval of fever, fatigue and SOB. Pt coming from UC after testing positive for covid. 91% on RA on EMS arrival, rec'd 125 solumedrol, 10mg  albuterol and 1 mg atrovent en route. EMS reports desat to 87% on exertion    Allergies Allergies  Allergen Reactions   Betadine [Povidone Iodine] Shortness Of Breath   Hydrocodone Itching    Level of Care/Admitting Diagnosis ED Disposition     ED Disposition  Admit   Condition  --   Comment  Hospital Area: [facility] [[id number]]  Level of Care: Telemetry Medical [104]  May place patient in observation at [facility] or [facility] if equivalent level of care is available:: No  Covid Evaluation: Confirmed COVID Negative  Diagnosis: Asthma [745110]  Admitting Physician: Emeline General [[id number]]  Attending Physician: Emeline General [[id number]]          B Medical/Surgery History Past Medical History:  Diagnosis Date   Anxiety    Asthma    Breast cancer (HCC)    left breast   Carpal tunnel syndrome    Depression    Habitual alcohol use    Heart attack (HCC)    Hypertension    Personal history of chemotherapy    Personal history of radiation therapy    RA (rheumatoid arthritis) (HCC)    Spinal stenosis    Vision abnormalities    Past Surgical History:  Procedure Laterality Date   BREAST BIOPSY     BREAST LUMPECTOMY  2005   left   CARDIAC CATHETERIZATION N/A 07/04/2016   Procedure: Left Heart Cath and Coronary Angiography;  Surgeon: Lyn Records, MD;  Location: [facility] INVASIVE CV LAB;  Service: Cardiovascular;  Laterality: N/A;    CARDIAC CATHETERIZATION N/A 07/04/2016   Procedure: Temporary Pacemaker;  Surgeon: Lyn Records, MD;  Location: [facility] INVASIVE CV LAB;  Service: Cardiovascular;  Laterality: N/A;   CARPAL TUNNEL RELEASE     CATARACT EXTRACTION Bilateral    EP IMPLANTABLE DEVICE N/A 07/05/2016   Procedure: Pacemaker Implant;  Surgeon: Marinus Maw, MD;  Location: [facility] INVASIVE CV LAB;  Service: Cardiovascular;  Laterality: N/A;   herniated rectum repair   11/2020   LAMINECTOMY  1966   LYMPHECTOMY  2005     A IV Location/Drains/Wounds Patient Lines/Drains/Airways Status     Active Line/Drains/Airways     Name Placement date Placement time Site Days   Peripheral IV 02/23/22 20 G Left Antecubital 02/23/22  1300  Antecubital  less than 1            Intake/Output Last 24 hours No intake or output data in the 24 hours ending 02/23/22 1556  Labs/Imaging Results for orders placed or performed during the hospital encounter of 02/23/22 (from the past 48 hour(s))  Lactic acid, plasma     Status: Abnormal   Collection Time: 02/23/22  1:10 PM  Result Value Ref Range   Lactic Acid, Venous 2.6 (HH) 0.5 - 1.9 mmol/L    Comment: CRITICAL RESULT CALLED TO, READ BACK BY AND VERIFIED WITH: S.ROWLAND,RN 02/23/2022 AT 1408 A.HUGHES Performed at [facility], [address] [place], [place] [address]   CBC with Differential     Status: Abnormal   Collection Time: 02/23/22  1:10 PM  Result Value Ref Range   WBC 11.3 (H) 4.0 - 10.5 K/uL   RBC 4.50 3.87 - 5.11 MIL/uL   Hemoglobin 14.8 12.0 - 15.0 g/dL   HCT 40.9 81.1 - 91.4 %   MCV 96.0 80.0 - 100.0 fL   MCH 32.9 26.0 - 34.0 pg   MCHC 34.3 30.0 - 36.0 g/dL   RDW 78.2 95.6 - 21.3 %   Platelets 249 150 - 400 K/uL   nRBC 0.0 0.0 - 0.2 %   Neutrophils Relative % 63 %   Neutro Abs 7.0 1.7 - 7.7 K/uL   Lymphocytes Relative 27 %   Lymphs Abs 3.1 0.7 - 4.0 K/uL   Monocytes Relative 10 %   Monocytes Absolute 1.1 (H) 0.1 - 1.0 K/uL   Eosinophils Relative 0 %    Eosinophils Absolute 0.0 0.0 - 0.5 K/uL   Basophils Relative 0 %   Basophils Absolute 0.1 0.0 - 0.1 K/uL   Immature Granulocytes 0 %   Abs Immature Granulocytes 0.04 0.00 - 0.07 K/uL    Comment: Performed at [facility], [address] [place], [place] [address]  Comprehensive metabolic panel     Status: Abnormal   Collection Time: 02/23/22  1:10 PM  Result Value Ref Range   Sodium 135 135 - 145 mmol/L   Potassium 3.3 (L) 3.5 - 5.1 mmol/L   Chloride 100 98 - 111 mmol/L   CO2 25 22 - 32 mmol/L   Glucose, Bld 117 (H) 70 - 99 mg/dL    Comment: Glucose reference range applies only to samples taken after fasting for at least 8 hours.   BUN 9 8 - 23 mg/dL   Creatinine, Ser 8.46 0.44 - 1.00 mg/dL   Calcium 8.9 8.9 - 96.2 mg/dL   Total Protein 6.8 6.5 - 8.1 g/dL   Albumin 3.3 (L) 3.5 - 5.0 g/dL   AST 51 (H) 15 - 41 U/L   ALT 28 0 - 44 U/L   Alkaline Phosphatase 53 38 - 126 U/L   Total Bilirubin 0.8 0.3 - 1.2 mg/dL   GFR, Estimated >95 >28 mL/min    Comment: (NOTE) Calculated using the CKD-EPI Creatinine Equation (2021)    Anion gap 10 5 - 15    Comment: Performed at [facility], [address] [place], [place] [address]  Troponin I (High Sensitivity)     Status: Abnormal   Collection Time: 02/23/22  1:10 PM  Result Value Ref Range   Troponin I (High Sensitivity) 78 (H) <18 ng/L    Comment: (NOTE) Elevated high sensitivity troponin I (hsTnI) values and significant  changes across serial measurements may suggest ACS but many other  chronic and acute conditions are known to elevate hsTnI results.  Refer to the "Links" section for chest pain algorithms and additional  guidance. Performed at [facility], [address] [place], [place] [address]   Resp Panel by RT-PCR (Flu A&B, Covid) Nasopharyngeal Swab     Status: None   Collection Time: 02/23/22  1:27 PM   Specimen: Nasopharyngeal Swab; Nasopharyngeal(NP) swabs in vial transport medium  Result Value Ref  Range   SARS Coronavirus 2 by RT PCR NEGATIVE NEGATIVE    Comment: (NOTE) SARS-CoV-2 target nucleic acids are NOT DETECTED.  The SARS-CoV-2 RNA is generally  detectable in upper respiratory specimens during the acute phase of infection. The lowest concentration of SARS-CoV-2 viral copies this assay can detect is 138 copies/mL. A negative result does not preclude SARS-Cov-2 infection and should not be used as the sole basis for treatment or other patient management decisions. A negative result may occur with  improper specimen collection/handling, submission of specimen other than nasopharyngeal swab, presence of viral mutation(s) within the areas targeted by this assay, and inadequate number of viral copies(<138 copies/mL). A negative result must be combined with clinical observations, patient history, and epidemiological information. The expected result is Negative.  Fact Sheet for Patients:  BloggerCourse.com  Fact Sheet for Healthcare Providers:  SeriousBroker.it  This test is no t yet approved or cleared by the [place] FDA and  has been authorized for detection and/or diagnosis of SARS-CoV-2 by FDA under an Emergency Use Authorization (EUA). This EUA will remain  in effect (meaning this test can be used) for the duration of the COVID-19 declaration under Section 564(b)(1) of the Act, 21 U.S.C.section 360bbb-3(b)(1), unless the authorization is terminated  or revoked sooner.       Influenza A by PCR NEGATIVE NEGATIVE   Influenza B by PCR NEGATIVE NEGATIVE    Comment: (NOTE) The Xpert Xpress SARS-CoV-2/FLU/RSV plus assay is intended as an aid in the diagnosis of influenza from Nasopharyngeal swab specimens and should not be used as a sole basis for treatment. Nasal washings and aspirates are unacceptable for Xpert Xpress SARS-CoV-2/FLU/RSV testing.  Fact Sheet for  Patients: BloggerCourse.com  Fact Sheet for Healthcare Providers: SeriousBroker.it  This test is not yet approved or cleared by the [place] FDA and has been authorized for detection and/or diagnosis of SARS-CoV-2 by FDA under an Emergency Use Authorization (EUA). This EUA will remain in effect (meaning this test can be used) for the duration of the COVID-19 declaration under Section 564(b)(1) of the Act, 21 U.S.C. section 360bbb-3(b)(1), unless the authorization is terminated or revoked.  Performed at [facility] Lab, [address], [place], [place] [address]   D-dimer, quantitative     Status: Abnormal   Collection Time: 02/23/22  1:35 PM  Result Value Ref Range   D-Dimer, Quant 0.94 (H) 0.00 - 0.50 ug/mL-FEU    Comment: (NOTE) At the manufacturer cut-off value of 0.5 g/mL FEU, this assay has a negative predictive value of 95-100%.This assay is intended for use in conjunction with a clinical pretest probability (PTP) assessment model to exclude pulmonary embolism (PE) and deep venous thrombosis (DVT) in outpatients suspected of PE or DVT. Results should be correlated with clinical presentation. Performed at [facility], [address] [place], [place] [address]   Lactate dehydrogenase     Status: Abnormal   Collection Time: 02/23/22  1:35 PM  Result Value Ref Range   LDH 260 (H) 98 - 192 U/L    Comment: Performed at [facility], [address], [place], [place] [address]  Ferritin     Status: None   Collection Time: 02/23/22  1:35 PM  Result Value Ref Range   Ferritin 63 11 - 307 ng/mL    Comment: Performed at [facility], [address] [place], [place] [address]  Fibrinogen     Status: Abnormal   Collection Time: 02/23/22  1:35 PM  Result Value Ref Range   Fibrinogen 488 (H) 210 - 475 mg/dL    Comment: (NOTE) Fibrinogen results may be underestimated in patients receiving thrombolytic  therapy. Performed at [facility], [address], [place], [place] [address]   C-reactive protein     Status: Abnormal   Collection Time: 02/23/22  1:35 PM  Result Value Ref Range   CRP 3.2 (H) <1.0 mg/dL    Comment: Performed at [facility], [address], [place], [place] [address]   DG Chest Portable 1 View  Result Date: 02/23/2022 CLINICAL DATA:  Shortness of breath, COVID EXAM: PORTABLE CHEST 1 VIEW COMPARISON:  12/13/2017 FINDINGS: The heart size is normal. Left chest multi lead pacer. Unchanged elevation of the left hemidiaphragm. The visualized skeletal structures are unremarkable. IMPRESSION: No acute abnormality of the lungs. Unchanged elevation of the left hemidiaphragm. Electronically Signed   By: Jearld Lesch M.D.   On: 02/23/2022 13:50    Pending Labs Unresulted Labs (From admission, onward)     Start     Ordered   02/23/22 1554  Blood gas, venous  Once,   R        02/23/22 1553   02/23/22 1547  Mycoplasma pneumoniae antibody, IgM  Once,   R        02/23/22 1546   02/23/22 1547  Legionella Pneumophila Serogp 1 Ur Ag  Once,   R        02/23/22 1546   02/23/22 1547  Expectorated Sputum Assessment w Gram Stain, Rflx to Resp Cult  Once,   R        02/23/22 1546   02/23/22 1335  Blood Culture (routine x 2)  BLOOD CULTURE X 2,   STAT      02/23/22 1334   02/23/22 1335  Procalcitonin  ONCE - STAT,   STAT        02/23/22 1334   02/23/22 1335  Triglycerides  (Triglyceride)  Once,   STAT        02/23/22 1334   02/23/22 1326  Lactic acid, plasma  Now then every 2 hours,   STAT      02/23/22 1326   Unscheduled  D-dimer, quantitative  Tomorrow morning,   R        02/23/22 1556            Vitals/Pain Today's Vitals   02/23/22 1317 02/23/22 1333 02/23/22 1455 02/23/22 1528  BP: 133/60  140/76 (!) 144/83  Pulse: (!) 102  100 97  Resp: (!) 22  (!) 30 (!) 22  Temp:  98 F (36.7 C)    TempSrc:  Oral    SpO2: 100%  93% 96%  Weight:      Height:       PainSc:        Isolation Precautions Airborne and Contact precautions  Medications Medications  AeroChamber Plus Flo-Vu Large MISC 1 each (0 each Other Hold 02/23/22 1456)  aspirin chewable tablet 81 mg (has no administration in time range)  atorvastatin (LIPITOR) tablet 40 mg (has no administration in time range)  hydrochlorothiazide (MICROZIDE) capsule 12.5 mg (has no administration in time range)  losartan (COZAAR) tablet 50 mg (has no administration in time range)  citalopram (CELEXA) tablet 20 mg (has no administration in time range)  traZODone (DESYREL) tablet 50 mg (has no administration in time range)  pantoprazole (PROTONIX) EC tablet 40 mg (has no administration in time range)  clonazePAM (KLONOPIN) tablet 0.5 mg (has no administration in time range)  mometasone-formoterol (DULERA) 200-5 MCG/ACT inhaler 2 puff (has no administration in time range)  enoxaparin (LOVENOX) injection 40 mg (  has no administration in time range)  methylPREDNISolone sodium succinate (SOLU-MEDROL) 40 mg/mL injection 40 mg (has no administration in time range)    Followed by  predniSONE (DELTASONE) tablet 40 mg (has no administration in time range)  ipratropium (ATROVENT) nebulizer solution 0.5 mg (has no administration in time range)  levalbuterol (XOPENEX) nebulizer solution 0.63 mg (has no administration in time range)  doxycycline (VIBRA-TABS) tablet 100 mg (has no administration in time range)  potassium chloride SA (KLOR-CON M) CR tablet 40 mEq (has no administration in time range)  guaiFENesin (MUCINEX) 12 hr tablet 1,200 mg (has no administration in time range)  benzonatate (TESSALON) capsule 100 mg (has no administration in time range)  loratadine (CLARITIN) tablet 10 mg (has no administration in time range)  magnesium sulfate IVPB 2 g 50 mL (2 g Intravenous New Bag/Given 02/23/22 1430)  albuterol (VENTOLIN HFA) 108 (90 Base) MCG/ACT inhaler 4 puff (4 puffs Inhalation Given 02/23/22 1430)     Mobility walks     Focused Assessments Pulmonary Assessment Handoff:  Lung sounds: Bilateral Breath Sounds: Rhonchi, Expiratory wheezes L Breath Sounds: Expiratory wheezes, Diminished R Breath Sounds: Expiratory wheezes, Diminished O2 Device: Nasal Cannula O2 Flow Rate (L/min): 4 L/min    R Recommendations: See Admitting Provider Note  Report given to:   Additional Notes: Pt is alert and oriented. Breath sounds are improved from arrived. Rec'd 10mg  of albuterol w/ EMS, 2g mag. Still a little junky on auscultation. Currently on 4LPM [place] satting 96%. Could ambulate, but respiratory wise will need a little O2 and support.

## 2022-02-24 ENCOUNTER — Observation Stay (HOSPITAL_COMMUNITY): Payer: Medicare Other

## 2022-02-24 DIAGNOSIS — M0579 Rheumatoid arthritis with rheumatoid factor of multiple sites without organ or systems involvement: Secondary | ICD-10-CM | POA: Diagnosis present

## 2022-02-24 DIAGNOSIS — E876 Hypokalemia: Secondary | ICD-10-CM | POA: Diagnosis present

## 2022-02-24 DIAGNOSIS — K219 Gastro-esophageal reflux disease without esophagitis: Secondary | ICD-10-CM | POA: Diagnosis present

## 2022-02-24 DIAGNOSIS — I10 Essential (primary) hypertension: Secondary | ICD-10-CM | POA: Diagnosis present

## 2022-02-24 DIAGNOSIS — Z809 Family history of malignant neoplasm, unspecified: Secondary | ICD-10-CM | POA: Diagnosis not present

## 2022-02-24 DIAGNOSIS — Z888 Allergy status to other drugs, medicaments and biological substances status: Secondary | ICD-10-CM | POA: Diagnosis not present

## 2022-02-24 DIAGNOSIS — J440 Chronic obstructive pulmonary disease with acute lower respiratory infection: Secondary | ICD-10-CM | POA: Diagnosis present

## 2022-02-24 DIAGNOSIS — I2510 Atherosclerotic heart disease of native coronary artery without angina pectoris: Secondary | ICD-10-CM | POA: Diagnosis present

## 2022-02-24 DIAGNOSIS — Z923 Personal history of irradiation: Secondary | ICD-10-CM | POA: Diagnosis not present

## 2022-02-24 DIAGNOSIS — Z7951 Long term (current) use of inhaled steroids: Secondary | ICD-10-CM | POA: Diagnosis not present

## 2022-02-24 DIAGNOSIS — I700 Atherosclerosis of aorta: Secondary | ICD-10-CM | POA: Diagnosis not present

## 2022-02-24 DIAGNOSIS — Z950 Presence of cardiac pacemaker: Secondary | ICD-10-CM | POA: Diagnosis not present

## 2022-02-24 DIAGNOSIS — J208 Acute bronchitis due to other specified organisms: Secondary | ICD-10-CM | POA: Diagnosis present

## 2022-02-24 DIAGNOSIS — Z87891 Personal history of nicotine dependence: Secondary | ICD-10-CM | POA: Diagnosis not present

## 2022-02-24 DIAGNOSIS — Z8616 Personal history of COVID-19: Secondary | ICD-10-CM | POA: Diagnosis not present

## 2022-02-24 DIAGNOSIS — R778 Other specified abnormalities of plasma proteins: Secondary | ICD-10-CM | POA: Diagnosis not present

## 2022-02-24 DIAGNOSIS — Z7982 Long term (current) use of aspirin: Secondary | ICD-10-CM | POA: Diagnosis not present

## 2022-02-24 DIAGNOSIS — J8410 Pulmonary fibrosis, unspecified: Secondary | ICD-10-CM | POA: Diagnosis not present

## 2022-02-24 DIAGNOSIS — R0602 Shortness of breath: Secondary | ICD-10-CM | POA: Diagnosis present

## 2022-02-24 DIAGNOSIS — Z823 Family history of stroke: Secondary | ICD-10-CM | POA: Diagnosis not present

## 2022-02-24 DIAGNOSIS — E785 Hyperlipidemia, unspecified: Secondary | ICD-10-CM | POA: Diagnosis present

## 2022-02-24 DIAGNOSIS — J9601 Acute respiratory failure with hypoxia: Principal | ICD-10-CM | POA: Diagnosis present

## 2022-02-24 DIAGNOSIS — F411 Generalized anxiety disorder: Secondary | ICD-10-CM | POA: Diagnosis present

## 2022-02-24 DIAGNOSIS — Z9221 Personal history of antineoplastic chemotherapy: Secondary | ICD-10-CM | POA: Diagnosis not present

## 2022-02-24 DIAGNOSIS — J9600 Acute respiratory failure, unspecified whether with hypoxia or hypercapnia: Secondary | ICD-10-CM | POA: Diagnosis not present

## 2022-02-24 DIAGNOSIS — Z885 Allergy status to narcotic agent status: Secondary | ICD-10-CM | POA: Diagnosis not present

## 2022-02-24 DIAGNOSIS — Z853 Personal history of malignant neoplasm of breast: Secondary | ICD-10-CM | POA: Diagnosis not present

## 2022-02-24 DIAGNOSIS — J4541 Moderate persistent asthma with (acute) exacerbation: Secondary | ICD-10-CM | POA: Diagnosis present

## 2022-02-24 DIAGNOSIS — I252 Old myocardial infarction: Secondary | ICD-10-CM | POA: Diagnosis not present

## 2022-02-24 LAB — BASIC METABOLIC PANEL
Anion gap: 7 (ref 5–15)
BUN: 9 mg/dL (ref 8–23)
CO2: 25 mmol/L (ref 22–32)
Calcium: 8.8 mg/dL — ABNORMAL LOW (ref 8.9–10.3)
Chloride: 101 mmol/L (ref 98–111)
Creatinine, Ser: 0.59 mg/dL (ref 0.44–1.00)
GFR, Estimated: >60 mL/min (ref >60)
Glucose, Bld: 191 mg/dL — ABNORMAL HIGH (ref 70–99)
Potassium: 4.5 mmol/L (ref 3.5–5.1)
Sodium: 133 mmol/L — ABNORMAL LOW (ref 135–145)

## 2022-02-24 LAB — ECHOCARDIOGRAM LIMITED
Area-P 1/2: 5.02 cm2
Height: 62 in
MV VTI: 1.81 cm2
S' Lateral: 3.9 cm
Single Plane A4C EF: 46.6 %
Weight: 2151.69 oz

## 2022-02-24 LAB — CBC
HCT: 41.1 % (ref 36.0–46.0)
Hemoglobin: 14.3 g/dL (ref 12.0–15.0)
MCH: 32.7 pg (ref 26.0–34.0)
MCHC: 34.8 g/dL (ref 30.0–36.0)
MCV: 94.1 fL (ref 80.0–100.0)
Platelets: 270 10*3/uL (ref 150–400)
RBC: 4.37 MIL/uL (ref 3.87–5.11)
RDW: 13.9 % (ref 11.5–15.5)
WBC: 10.5 10*3/uL (ref 4.0–10.5)
nRBC: 0 % (ref 0.0–0.2)

## 2022-02-24 LAB — D-DIMER, QUANTITATIVE: D-Dimer, Quant: 0.73 ug/mL-FEU — ABNORMAL HIGH (ref 0.00–0.50)

## 2022-02-24 MED ORDER — METHOTREXATE 2.5 MG PO TABS
10.0000 mg | ORAL_TABLET | ORAL | Status: DC
  Administered 2022-02-24: 10 mg via ORAL
  Filled 2022-02-24: qty 4

## 2022-02-24 MED ORDER — IOHEXOL 350 MG/ML SOLN
80.0000 mL | Freq: Once | INTRAVENOUS | Status: AC | PRN
  Administered 2022-02-24: 80 mL via INTRAVENOUS

## 2022-02-24 MED ORDER — LEVALBUTEROL HCL 0.63 MG/3ML IN NEBU
0.6300 mg | INHALATION_SOLUTION | Freq: Four times a day (QID) | RESPIRATORY_TRACT | Status: DC
  Administered 2022-02-25 – 2022-02-26 (×5): 0.63 mg via RESPIRATORY_TRACT
  Filled 2022-02-24 (×5): qty 3

## 2022-02-24 MED ORDER — ACETAMINOPHEN 325 MG PO TABS
650.0000 mg | ORAL_TABLET | ORAL | Status: DC | PRN
Start: 2022-02-24 — End: 2022-02-26
  Administered 2022-02-24 – 2022-02-25 (×4): 650 mg via ORAL
  Filled 2022-02-24 (×4): qty 2

## 2022-02-24 MED ORDER — GUAIFENESIN-DM 100-10 MG/5ML PO SYRP
5.0000 mL | ORAL_SOLUTION | ORAL | Status: DC | PRN
  Administered 2022-02-24 (×2): 5 mL via ORAL
  Filled 2022-02-24 (×3): qty 5

## 2022-02-24 NOTE — Assessment & Plan Note (Signed)
Remained flat, waiting on Echo ?Not complaining of chest pain ?

## 2022-02-24 NOTE — Progress Notes (Signed)
2D echo attempted, respiratory went in room for treatment, then RN stated that CT was here to get patient. Will try echo later ?

## 2022-02-24 NOTE — Assessment & Plan Note (Signed)
Methotrexate 10 mg every Sunday ?

## 2022-02-24 NOTE — Progress Notes (Signed)
?PROGRESS NOTE ? ? ? ?Yassmin Binegar Favorite  BZJ:[id number] DOB: 01-08-1946 DOA: 02/23/2022 ?PCP: Glenis Smoker, MD  ? ?  ?Brief Narrative:  ?[name] w hx of mild intermittent asthma, CAD s/p LAD thrombectomy w/o stenting in '17, RA, GERD, hyperlipidemia, completed heart block s/p PPM who presents on 02/23/22 with worsening wheezing, SOB, coughing for several days. Had fevers of 100.8 F max, O2 dropped to 85% in ER, normalized on 2 L [place]. Reportedly Rapid Covid test + in UC earlier in the day prior to being sent to ED. Trop 78 initially, came down to 65. Echo ordered. EKG neg.  ? ?New events last 24 hours / Subjective: ?Breathing a little bit better. D dimer elevated, CTA obtained. Neg for clot. Tolerating   ? ?Assessment & Plan: ?  ?Principal Problem: ?  Acute respiratory failure with hypoxia ([place]) 2/2 acute asthma exacerbation ? Supplemental oxygen, wean as able ? Atrovent/Xopenex nebs prn ? Cont Dulera ? Prednisone 40 mg/d d 1/4 ? Cont Doxycycline to cover for infection ? Outside window for antiviral tx for possible covid ? Tessalon Perles, Mucinex, Robitussin DM prn ? ?Active Problems: ?  Anxiety state ? Cont Celexa 20 mg/d ? Cont trazodone 50 mg qhs prn ? Cont Klonopin 0.5 mg bid prn ? ?  Elevated troponin  ? Remained flat, waiting on Echo ?Not complaining of chest pain ? ?  Essential hypertension ? Cont HCTZ 12.5 mg/d ? Cont losartan 50 mg/d ? ?  CAD- S/P LAD thrombectomy 07/04/16 ? Cont ASA and Lipitor 40 mg/d ? ?  GERD ? Protonix 40 mg/d ? ?  Rheumatoid arthritis involving multiple sites with positive rheumatoid factor ([place]) ? Methotrexate 10 mg every Sunday ? ?DVT prophylaxis: Lovenox ?Code Status: Full ?Family Communication: self, daughter bedside ?Coming From: Home ?Disposition Plan: Probably Home ?Barriers to Discharge: Clinical improvement ? ?Consultants:  ?None ? ?Procedures:  ?None ? ?Antimicrobials:  ?Anti-infectives (From admission, onward)  ? ? Start     Dose/Rate Route Frequency Ordered Stop  ?  02/23/22 1545  doxycycline (VIBRA-TABS) tablet 100 mg       ? 100 mg Oral Every 12 hours 02/23/22 1536    ? ?  ? ? ? ?Objective: ?Vitals:  ? 02/24/22 0544 02/24/22 0819 02/24/22 0958 02/24/22 1345  ?BP: 123/81  (!) 144/75   ?Pulse: 77 (!) 101 82 78  ?Resp: '18 18 18 16  '$ ?Temp:   97.9 ?F (36.6 ?C)   ?TempSrc:   Oral   ?SpO2: 99% 90% 96% 94%  ?Weight:      ?Height:      ? ? ?Intake/Output Summary (Last 24 hours) at 02/24/2022 1647 ?Last data filed at 02/24/2022 0175 ?Gross per 24 hour  ?Intake 660 ml  ?Output --  ?Net 660 ml  ? ?Filed Weights  ? 02/23/22 1306  ?Weight: 61 kg  ? ? ?Examination:  ?General exam: Appears calm and comfortable  ?Respiratory system: Diffuse wheezing, coarse breath sounds noted at bases as well. Respiratory effort normal. No respiratory distress. No conversational dyspnea.  ?Cardiovascular system: S1 & S2 heard, RRR. No pedal edema. ?Gastrointestinal system: Abdomen is nondistended, soft and nontender. Normal bowel sounds heard. ?Central nervous system: Alert and oriented. No focal neurological deficits. Speech clear.  ?Extremities: Symmetric in appearance  ?Skin: No rashes, lesions or ulcers on exposed skin  ?Psychiatry: Judgement and insight appear normal. Mood & affect appropriate.  ? ?Data Reviewed: I have personally reviewed following labs and imaging studies ? ?CBC: ?  Recent Labs  ?Lab 02/23/22 ?1310 02/24/22 ?1010  ?WBC 11.3* 10.5  ?NEUTROABS 7.0  --   ?HGB 14.8 14.3  ?HCT 43.2 41.1  ?MCV 96.0 94.1  ?PLT 249 270  ? ?Basic Metabolic Panel: ?Recent Labs  ?Lab 02/23/22 ?1310 02/24/22 ?1010  ?NA 135 133*  ?K 3.3* 4.5  ?CL 100 101  ?CO2 25 25  ?GLUCOSE 117* 191*  ?BUN 9 9  ?CREATININE 0.65 0.59  ?CALCIUM 8.9 8.8*  ? ?GFR: ?Estimated Creatinine Clearance: 49.9 mL/min (by C-G formula based on SCr of 0.59 mg/dL). ? ?Liver Function Tests: ?Recent Labs  ?Lab 02/23/22 ?1310  ?AST 51*  ?ALT 28  ?ALKPHOS 53  ?BILITOT 0.8  ?PROT 6.8  ?ALBUMIN 3.3*  ? ?Lipid Profile: ?Recent Labs  ?  02/23/22 ?9833  ?TRIG  61  ? ?Anemia Panel: ?Recent Labs  ?  02/23/22 ?1335  ?FERRITIN 63  ? ?Sepsis Labs: ?Recent Labs  ?Lab 02/23/22 ?1310 02/23/22 ?1335 02/23/22 ?1647 02/23/22 ?2053  ?PROCALCITON  --  <0.10  --   --   ?LATICACIDVEN 2.6*  --  2.0* 2.2*  ? ? ?Recent Results (from the past 240 hour(s))  ?Resp Panel by RT-PCR (Flu A&B, Covid) Nasopharyngeal Swab     Status: None  ? Collection Time: 02/23/22  1:27 PM  ? Specimen: Nasopharyngeal Swab; Nasopharyngeal(NP) swabs in vial transport medium  ?Result Value Ref Range Status  ? SARS Coronavirus 2 by RT PCR NEGATIVE NEGATIVE Final  ?  Comment: (NOTE) ?SARS-CoV-2 target nucleic acids are NOT DETECTED. ? ?The SARS-CoV-2 RNA is generally detectable in upper respiratory ?specimens during the acute phase of infection. The lowest ?concentration of SARS-CoV-2 viral copies this assay can detect is ?138 copies/mL. A negative result does not preclude SARS-Cov-2 ?infection and should not be used as the sole basis for treatment or ?other patient management decisions. A negative result may occur with  ?improper specimen collection/handling, submission of specimen other ?than nasopharyngeal swab, presence of viral mutation(s) within the ?areas targeted by this assay, and inadequate number of viral ?copies(<138 copies/mL). A negative result must be combined with ?clinical observations, patient history, and epidemiological ?information. The expected result is Negative. ? ?Fact Sheet for Patients:  ?EntrepreneurPulse.com.au ? ?Fact Sheet for Healthcare Providers:  ?IncredibleEmployment.be ? ?This test is no t yet approved or cleared by the [place] FDA and  ?has been authorized for detection and/or diagnosis of SARS-CoV-2 by ?FDA under an Emergency Use Authorization (EUA). This EUA will remain  ?in effect (meaning this test can be used) for the duration of the ?COVID-19 declaration under Section 564(b)(1) of the Act, 21 ?U.S.C.section 360bbb-3(b)(1), unless the  authorization is terminated  ?or revoked sooner.  ? ? ?  ? Influenza A by PCR NEGATIVE NEGATIVE Final  ? Influenza B by PCR NEGATIVE NEGATIVE Final  ?  Comment: (NOTE) ?The Xpert Xpress SARS-CoV-2/FLU/RSV plus assay is intended as an aid ?in the diagnosis of influenza from Nasopharyngeal swab specimens and ?should not be used as a sole basis for treatment. Nasal washings and ?aspirates are unacceptable for Xpert Xpress SARS-CoV-2/FLU/RSV ?testing. ? ?Fact Sheet for Patients: ?EntrepreneurPulse.com.au ? ?Fact Sheet for Healthcare Providers: ?IncredibleEmployment.be ? ?This test is not yet approved or cleared by the [place] FDA and ?has been authorized for detection and/or diagnosis of SARS-CoV-2 by ?FDA under an Emergency Use Authorization (EUA). This EUA will remain ?in effect (meaning this test can be used) for the duration of the ?COVID-19 declaration under Section 564(b)(1) of the Act, 21  U.S.C. ?section 360bbb-3(b)(1), unless the authorization is terminated or ?revoked. ? ?Performed at [facility], [place] [address], [place], [place] ?[address] ?  ?Blood Culture (routine x 2)     Status: None (Preliminary result)  ? Collection Time: 02/23/22  2:03 PM  ? Specimen: BLOOD LEFT FOREARM  ?Result Value Ref Range Status  ? Specimen Description BLOOD LEFT FOREARM  Final  ? Special Requests   Final  ?  BOTTLES DRAWN AEROBIC AND ANAEROBIC Blood Culture adequate volume  ? Culture   Final  ?  NO GROWTH 1 DAY ?Performed at [facility], [place] [address] [place], [place] [address] ?  ? Report Status PENDING  Incomplete  ?Blood Culture (routine x 2)     Status: None (Preliminary result)  ? Collection Time: 02/23/22  2:04 PM  ? Specimen: BLOOD  ?Result Value Ref Range Status  ? Specimen Description BLOOD RIGHT ANTECUBITAL  Final  ? Special Requests   Final  ?  BOTTLES DRAWN AEROBIC AND ANAEROBIC Blood Culture adequate volume  ? Culture   Final  ?  NO GROWTH 1 DAY ?Performed at  [facility], [place] [address], [place], [place] [address] ?  ? Report Status PENDING  Incomplete  ?Expectorated Sputum Assessment w Gram Stain, Rflx to Resp Cult     Status: None (Preliminary result)

## 2022-02-24 NOTE — Assessment & Plan Note (Signed)
Cont ASA and Lipitor 40 mg/d ?

## 2022-02-24 NOTE — Assessment & Plan Note (Signed)
Supplemental oxygen, wean as able ?Atrovent/Xopenex nebs prn ?Cont Dulera ?Prednisone 40 mg/d d 1/4 ?Cont Doxycycline to cover for infection ?Outside window for antiviral tx for possible covid ?Tessalon Perles, Mucinex, Robitussin DM prn ? ?

## 2022-02-24 NOTE — Assessment & Plan Note (Signed)
Cont Celexa 20 mg/d ?Cont trazodone 50 mg qhs prn ?Cont Klonopin 0.5 mg bid prn ?

## 2022-02-24 NOTE — Assessment & Plan Note (Signed)
Cont HCTZ 12.5 mg/d ?Cont losartan 50 mg/d ?

## 2022-02-24 NOTE — Assessment & Plan Note (Signed)
Protonix 40 mg daily

## 2022-02-24 NOTE — Progress Notes (Signed)
?  Echocardiogram ?2D Echocardiogram has been performed. ? ?[name] ?02/24/2022, 4:41 PM ?

## 2022-02-25 DIAGNOSIS — J9601 Acute respiratory failure with hypoxia: Secondary | ICD-10-CM

## 2022-02-25 LAB — CBC
HCT: 41.2 % (ref 36.0–46.0)
Hemoglobin: 13.8 g/dL (ref 12.0–15.0)
MCH: 32.1 pg (ref 26.0–34.0)
MCHC: 33.5 g/dL (ref 30.0–36.0)
MCV: 95.8 fL (ref 80.0–100.0)
Platelets: 280 10*3/uL (ref 150–400)
RBC: 4.3 MIL/uL (ref 3.87–5.11)
RDW: 14.2 % (ref 11.5–15.5)
WBC: 17.1 10*3/uL — ABNORMAL HIGH (ref 4.0–10.5)
nRBC: 0 % (ref 0.0–0.2)

## 2022-02-25 LAB — BASIC METABOLIC PANEL
Anion gap: 8 (ref 5–15)
BUN: 14 mg/dL (ref 8–23)
CO2: 25 mmol/L (ref 22–32)
Calcium: 9.1 mg/dL (ref 8.9–10.3)
Chloride: 104 mmol/L (ref 98–111)
Creatinine, Ser: 0.78 mg/dL (ref 0.44–1.00)
GFR, Estimated: >60 mL/min (ref >60)
Glucose, Bld: 128 mg/dL — ABNORMAL HIGH (ref 70–99)
Potassium: 4.8 mmol/L (ref 3.5–5.1)
Sodium: 137 mmol/L (ref 135–145)

## 2022-02-25 LAB — MYCOPLASMA PNEUMONIAE ANTIBODY, IGM: Mycoplasma pneumo IgM: <770 U/mL (ref 0–769)

## 2022-02-25 MED ORDER — HYDROXYCHLOROQUINE SULFATE 200 MG PO TABS
200.0000 mg | ORAL_TABLET | Freq: Every day | ORAL | Status: DC
  Administered 2022-02-25 – 2022-02-26 (×2): 200 mg via ORAL
  Filled 2022-02-25 (×2): qty 1

## 2022-02-25 NOTE — Progress Notes (Signed)
?PROGRESS NOTE ? ? ? ?[name] Hilyer  KYH:[id number] DOB: 1946/08/04 DOA: 02/23/2022 ?PCP: Glenis Smoker, MD ? ? ?Brief Narrative:  ?[name] w hx of mild intermittent asthma, CAD s/p LAD thrombectomy w/o stenting in '17, RA, GERD, hyperlipidemia, completed heart block s/p PPM who presents on 02/23/22 with worsening wheezing, SOB, coughing for several days.  Patient had notable hypoxia at intake, recently positive COVID testing outpatient but negative in-house. ? ?Assessment & Plan: ?  ?Principal Problem: ?  Acute respiratory failure with hypoxia ([place]) ?Active Problems: ?  CAD- S/P LAD thrombectomy 07/04/16 ?  Anxiety state ?  Asthma exacerbation ?  Essential hypertension ?  Rheumatoid arthritis involving multiple sites with positive rheumatoid factor ([place]) ?  GERD (gastroesophageal reflux disease) ?  Elevated troponin ? ? ?Acute hypoxic respiratory failure secondary to asthma exacerbation ?Acute COVID-19 pneumonia ruled out ?Continue steroids, nebs as tolerated ?Wean oxygen, continues to have hypoxia with minimal exertion today sats in the 80s ?Continue doxycycline ?Outside window for antiviral treatment for COVID -again testing negative in house ? ?Elevated troponin, secondary to supply/demand mismatch and hypoxia as above ?ACS ruled out ?Echocardiogram EF 55 to 60% without wall motion abnormalities ?No signs or symptoms of chest pain ? ?Anxiety, chronic  ?Continue home medications including Celexa, trazodone, Klonopin  ? ?Essential hypertension, well controlled  ?Continue home HCTZ, losartan ? ?CAD history status post LAD thrombectomy August 2017  ?Minimally elevated troponin as above, ACS ruled out  ?Continue aspirin, atorvastatin  ? ?GERD ?Continue pantoprazole ?  ?Rheumatoid arthritis involving multiple sites with positive rheumatoid factor ([place]) ?Continues on methotrexate 10 mg every Sunday ? ?DVT prophylaxis: Lovenox ?Code Status: Full ?Family Communication: Daughter at bedside ? ?Status is:  Inpatient ? ?Dispo: The patient is from: Home ?             Anticipated d/c is to: Home ?             Anticipated d/c date is: 24 to 48 hours pending clinical course ?             Patient currently not medically stable for discharge due to ongoing need for supplemental oxygen well above baseline as well as profound dyspnea with minimal exertion ? ?Consultants:  ?None ? ?Procedures:  ?None ? ?Antimicrobials:  ?Doxycycline ? ?Subjective: ?No acute issues or events overnight ? ?Objective: ?Vitals:  ? 02/24/22 1652 02/24/22 2139 02/24/22 2141 02/25/22 0213  ?BP: (!) 155/64 (!) 161/78 (!) 161/78   ?Pulse: 81 91 95   ?Resp: '16 19 19   '$ ?Temp: 97.9 ?F (36.6 ?C) 98.2 ?F (36.8 ?C) 98.2 ?F (36.8 ?C)   ?TempSrc: Oral Oral Oral   ?SpO2: 97% 99% 98% 97%  ?Weight:      ?Height:      ? ? ?Intake/Output Summary (Last 24 hours) at 02/25/2022 0707 ?Last data filed at 02/24/2022 2200 ?Gross per 24 hour  ?Intake 517 ml  ?Output --  ?Net 517 ml  ? ?Filed Weights  ? 02/23/22 1306  ?Weight: 61 kg  ? ? ?Examination: ? ?General:  Pleasantly resting in bed, No acute distress. ?HEENT:  Normocephalic atraumatic.  Sclerae nonicteric, noninjected.  Extraocular movements intact bilaterally. ?Neck:  Without mass or deformity.  Trachea is midline. ?Lungs: Bilateral end expiratory wheeze ?Heart:  Regular rate and rhythm.  Without murmurs, rubs, or gallops. ?Abdomen:  Soft, nontender, nondistended.  Without guarding or rebound. ?Extremities: Without cyanosis, clubbing, edema, or obvious deformity. ?Vascular:  Dorsalis pedis and posterior  tibial pulses palpable bilaterally. ?Skin:  Warm and dry, no erythema, no ulcerations. ? ?Data Reviewed: I have personally reviewed following labs and imaging studies ? ?CBC: ?Recent Labs  ?Lab 02/23/22 ?1310 02/24/22 ?1010  ?WBC 11.3* 10.5  ?NEUTROABS 7.0  --   ?HGB 14.8 14.3  ?HCT 43.2 41.1  ?MCV 96.0 94.1  ?PLT 249 270  ? ?Basic Metabolic Panel: ?Recent Labs  ?Lab 02/23/22 ?1310 02/24/22 ?1010  ?NA 135 133*  ?K 3.3*  4.5  ?CL 100 101  ?CO2 25 25  ?GLUCOSE 117* 191*  ?BUN 9 9  ?CREATININE 0.65 0.59  ?CALCIUM 8.9 8.8*  ? ?GFR: ?Estimated Creatinine Clearance: 49.9 mL/min (by C-G formula based on SCr of 0.59 mg/dL). ?Liver Function Tests: ?Recent Labs  ?Lab 02/23/22 ?1310  ?AST 51*  ?ALT 28  ?ALKPHOS 53  ?BILITOT 0.8  ?PROT 6.8  ?ALBUMIN 3.3*  ? ?No results for input(s): LIPASE, AMYLASE in the last 168 hours. ?No results for input(s): AMMONIA in the last 168 hours. ?Coagulation Profile: ?No results for input(s): INR, PROTIME in the last 168 hours. ?Cardiac Enzymes: ?No results for input(s): CKTOTAL, CKMB, CKMBINDEX, TROPONINI in the last 168 hours. ?BNP (last 3 results) ?No results for input(s): PROBNP in the last 8760 hours. ?HbA1C: ?No results for input(s): HGBA1C in the last 72 hours. ?CBG: ?No results for input(s): GLUCAP in the last 168 hours. ?Lipid Profile: ?Recent Labs  ?  02/23/22 ?6160  ?TRIG 61  ? ?Thyroid Function Tests: ?No results for input(s): TSH, T4TOTAL, FREET4, T3FREE, THYROIDAB in the last 72 hours. ?Anemia Panel: ?Recent Labs  ?  02/23/22 ?1335  ?FERRITIN 63  ? ?Sepsis Labs: ?Recent Labs  ?Lab 02/23/22 ?1310 02/23/22 ?1335 02/23/22 ?1647 02/23/22 ?2053  ?PROCALCITON  --  <0.10  --   --   ?LATICACIDVEN 2.6*  --  2.0* 2.2*  ? ? ?Recent Results (from the past 240 hour(s))  ?Resp Panel by RT-PCR (Flu A&B, Covid) Nasopharyngeal Swab     Status: None  ? Collection Time: 02/23/22  1:27 PM  ? Specimen: Nasopharyngeal Swab; Nasopharyngeal(NP) swabs in vial transport medium  ?Result Value Ref Range Status  ? SARS Coronavirus 2 by RT PCR NEGATIVE NEGATIVE Final  ?  Comment: (NOTE) ?SARS-CoV-2 target nucleic acids are NOT DETECTED. ? ?The SARS-CoV-2 RNA is generally detectable in upper respiratory ?specimens during the acute phase of infection. The lowest ?concentration of SARS-CoV-2 viral copies this assay can detect is ?138 copies/mL. A negative result does not preclude SARS-Cov-2 ?infection and should not be used as the  sole basis for treatment or ?other patient management decisions. A negative result may occur with  ?improper specimen collection/handling, submission of specimen other ?than nasopharyngeal swab, presence of viral mutation(s) within the ?areas targeted by this assay, and inadequate number of viral ?copies(<138 copies/mL). A negative result must be combined with ?clinical observations, patient history, and epidemiological ?information. The expected result is Negative. ? ?Fact Sheet for Patients:  ?EntrepreneurPulse.com.au ? ?Fact Sheet for Healthcare Providers:  ?IncredibleEmployment.be ? ?This test is no t yet approved or cleared by the [place] FDA and  ?has been authorized for detection and/or diagnosis of SARS-CoV-2 by ?FDA under an Emergency Use Authorization (EUA). This EUA will remain  ?in effect (meaning this test can be used) for the duration of the ?COVID-19 declaration under Section 564(b)(1) of the Act, 21 ?U.S.C.section 360bbb-3(b)(1), unless the authorization is terminated  ?or revoked sooner.  ? ? ?  ? Influenza A by PCR NEGATIVE NEGATIVE  Final  ? Influenza B by PCR NEGATIVE NEGATIVE Final  ?  Comment: (NOTE) ?The Xpert Xpress SARS-CoV-2/FLU/RSV plus assay is intended as an aid ?in the diagnosis of influenza from Nasopharyngeal swab specimens and ?should not be used as a sole basis for treatment. Nasal washings and ?aspirates are unacceptable for Xpert Xpress SARS-CoV-2/FLU/RSV ?testing. ? ?Fact Sheet for Patients: ?EntrepreneurPulse.com.au ? ?Fact Sheet for Healthcare Providers: ?IncredibleEmployment.be ? ?This test is not yet approved or cleared by the [place] FDA and ?has been authorized for detection and/or diagnosis of SARS-CoV-2 by ?FDA under an Emergency Use Authorization (EUA). This EUA will remain ?in effect (meaning this test can be used) for the duration of the ?COVID-19 declaration under Section 564(b)(1) of the  Act, 21 U.S.C. ?section 360bbb-3(b)(1), unless the authorization is terminated or ?revoked. ? ?Performed at [facility], [place] [address], [place], [place] ?[address] ?  ?Blood Culture (routine x 2)     [facility]

## 2022-02-25 NOTE — Progress Notes (Signed)
RT instructed patient on the use of a flutter valve and incentive spirometer, Patient is able to demonstrate back good technique.  Patient is able to reach 750 mL using the incentive spirometer. ?

## 2022-02-25 NOTE — Progress Notes (Signed)
SATURATION QUALIFICATIONS: (This note is used to comply with regulatory documentation for home oxygen) ? ?Patient Saturations on Room Air at Rest = 96% ? ?Patient Saturations on Room Air while Ambulating = 88% ? ?Patient Saturations on 2 Liters of oxygen while Ambulating = 95% ? ?Please briefly explain why patient needs home oxygen: Patient verbalized dyspnea while ambulating and oxygen sats dropped to 88%. ?

## 2022-02-26 LAB — CBC
HCT: 38.6 % (ref 36.0–46.0)
Hemoglobin: 13.1 g/dL (ref 12.0–15.0)
MCH: 32.3 pg (ref 26.0–34.0)
MCHC: 33.9 g/dL (ref 30.0–36.0)
MCV: 95.1 fL (ref 80.0–100.0)
Platelets: 289 10*3/uL (ref 150–400)
RBC: 4.06 MIL/uL (ref 3.87–5.11)
RDW: 14 % (ref 11.5–15.5)
WBC: 13.5 10*3/uL — ABNORMAL HIGH (ref 4.0–10.5)
nRBC: 0 % (ref 0.0–0.2)

## 2022-02-26 LAB — BASIC METABOLIC PANEL
Anion gap: 7 (ref 5–15)
BUN: 15 mg/dL (ref 8–23)
CO2: 25 mmol/L (ref 22–32)
Calcium: 8.6 mg/dL — ABNORMAL LOW (ref 8.9–10.3)
Chloride: 104 mmol/L (ref 98–111)
Creatinine, Ser: 0.55 mg/dL (ref 0.44–1.00)
GFR, Estimated: >60 mL/min (ref >60)
Glucose, Bld: 99 mg/dL (ref 70–99)
Potassium: 3.7 mmol/L (ref 3.5–5.1)
Sodium: 136 mmol/L (ref 135–145)

## 2022-02-26 MED ORDER — GUAIFENESIN ER 600 MG PO TB12: 1200.0000 mg | ORAL_TABLET | Freq: Two times a day (BID) | ORAL | 0 refills | Status: DC

## 2022-02-26 MED ORDER — LEVALBUTEROL HCL 0.63 MG/3ML IN NEBU: 0.6300 mg | INHALATION_SOLUTION | RESPIRATORY_TRACT | 2 refills | Status: DC | PRN

## 2022-02-26 MED ORDER — GUAIFENESIN-DM 100-10 MG/5ML PO SYRP: 5.0000 mL | ORAL_SOLUTION | ORAL | 0 refills | Status: DC | PRN

## 2022-02-26 MED ORDER — DOXYCYCLINE HYCLATE 50 MG PO CAPS: 100.0000 mg | ORAL_CAPSULE | Freq: Two times a day (BID) | ORAL | 0 refills | Status: AC

## 2022-02-26 MED ORDER — METHYLPREDNISOLONE 4 MG PO TBPK
ORAL_TABLET | ORAL | 0 refills | Status: DC
Start: 2022-02-26 — End: 2022-05-14

## 2022-02-26 NOTE — Progress Notes (Signed)
Pt discharged to home. DC instructions given to patient with daughter, Mickel Baas, at bedside. No concerns voiced. Pt and daughter encouraged to stop by pharmacy and pick up meds that were e-prescribed by Provider. Voiced understanding. Pt left unit in wheelchair pushed by hospital volunteer. Left in stable condition.  ?

## 2022-02-26 NOTE — Progress Notes (Signed)
SATURATION QUALIFICATIONS: (This note is used to comply with regulatory documentation for home oxygen) ? ?Patient Saturations on Room Air at Rest = 92% ? ?Patient Saturations on Room Air while Ambulating = 92% ? ?Patient Saturations on 0 Liters of oxygen while Ambulating = 92% ? ?Pt tolerated walk with no distress. ?

## 2022-02-26 NOTE — Discharge Summary (Signed)
Physician Discharge Summary  ?[name] WHQ:[id number] DOB: 29-Feb-1946 DOA: 02/23/2022 ? ?PCP: Brandy Smoker, MD ? ?Admit date: 02/23/2022 ?Discharge date: 02/26/2022 ? ?Admitted From: Home ?Disposition: Home ? ?Recommendations for Outpatient Follow-up:  ?Follow up with PCP in 1-2 weeks ?Follow-up with pulmonology as scheduled in the next few weeks ? ?Home Health: None ?Equipment/Devices: Nebulizer ? ?Discharge Condition: Stable ?CODE STATUS: Full  ?Diet recommendation: Low-salt low-fat diet as tolerated ? ?Brief/Interim Summary: ?[name] w hx of mild intermittent asthma, CAD s/p LAD thrombectomy w/o stenting in '17, RA, GERD, hyperlipidemia, completed heart block s/p PPM who presents on 02/23/22 with worsening wheezing, SOB, coughing for several days.  Patient had notable hypoxia at intake, recently positive COVID testing outpatient but negative in-house. ? ?Patient admitted for acute hypoxic respiratory failure in setting of asthma exacerbation, acute COVID-19 pneumonia ruled out.  Patient able to wean off oxygen today ambulating without any further hypoxia with very minimal to no symptoms.  We will continue doxycycline to complete course outpatient.  Recommend close follow-up with PCP and pulmonology as scheduled in the next few weeks.  Patient did have incidentally noted elevated troponin in the setting of supply/demand mismatch and hypoxia, ACS ruled out given reassuringly negative troponin and echo without overt wall motion abnormality or atypical findings.  Patient otherwise stable and agreeable for discharge home, family at bedside agrees. ? ?Discharge Diagnoses:  ?Principal Problem: ?  Acute respiratory failure with hypoxia ([place]) ?Active Problems: ?  CAD- S/P LAD thrombectomy 07/04/16 ?  Anxiety state ?  Asthma exacerbation ?  Essential hypertension ?  Rheumatoid arthritis involving multiple sites with positive rheumatoid factor ([place]) ?  GERD (gastroesophageal reflux disease) ?  Elevated  troponin ? ? ? ?Discharge Instructions ? ?Discharge Instructions   ? ? For home use only DME Nebulizer machine   Complete by: As directed ?  ? Patient needs a nebulizer to treat with the following condition: COPD (chronic obstructive pulmonary disease) ([place])  ? Length of Need: Lifetime  ? ?  ? ?Allergies as of 02/26/2022   ? ?   Reactions  ? Betadine [povidone Iodine] Shortness Of Breath  ? Hydrocodone Itching  ? ?  ? ?  ?Medication List  ?  ? ?TAKE these medications   ? ?acetaminophen 325 MG tablet ?Commonly known as: TYLENOL ?Take 325-650 mg by mouth every 6 (six) hours as needed for mild pain, headache or fever. ?  ?albuterol 108 (90 Base) MCG/ACT inhaler ?Commonly known as: VENTOLIN HFA ?Inhale 2 puffs into the lungs every 6 (six) hours as needed for wheezing or shortness of breath. ?  ?aspirin 81 MG tablet ?Take 81 mg by mouth daily. ?  ?atorvastatin 40 MG tablet ?Commonly known as: LIPITOR ?TAKE 1 TABLET(40 MG) BY MOUTH DAILY AT 6 PM ?What changed: See the new instructions. ?  ?budesonide-formoterol 160-4.5 MCG/ACT inhaler ?Commonly known as: SYMBICORT ?Inhale 2 puffs into the lungs 2 (two) times daily. ?  ?calcium carbonate 600 MG Tabs tablet ?Commonly known as: OS-CAL ?Take 600 mg by mouth 2 (two) times daily with a meal. ?  ?citalopram 20 MG tablet ?Commonly known as: CELEXA ?Take 20 mg by mouth daily. ?  ?clonazePAM 0.5 MG tablet ?Commonly known as: KLONOPIN ?Take 0.25-0.5 mg by mouth daily as needed for anxiety. ?  ?doxycycline 50 MG capsule ?Commonly known as: VIBRAMYCIN ?Take 2 capsules (100 mg total) by mouth 2 (two) times daily for 2 days. ?  ?folic acid 1 MG tablet ?Commonly known  as: FOLVITE ?TAKE 1 TABLET BY MOUTH DAILY ?  ?guaiFENesin 600 MG 12 hr tablet ?Commonly known as: Accident ?Take 2 tablets (1,200 mg total) by mouth 2 (two) times daily. ?  ?guaiFENesin-dextromethorphan 100-10 MG/5ML syrup ?Commonly known as: ROBITUSSIN DM ?Take 5 mLs by mouth every 4 (four) hours as needed for cough. ?   ?hydrochlorothiazide 12.5 MG capsule ?Commonly known as: MICROZIDE ?Take 1 tablet by mouth 3 times weekly as needed for lower extremity swelling. ?What changed:  ?how much to take ?how to take this ?when to take this ?additional instructions ?  ?hydroxychloroquine 200 MG tablet ?Commonly known as: PLAQUENIL ?TAKE 1 TABLET BY MOUTH TWICE DAILY MONDAY THROUGH FRIDAY ONLY ?What changed: See the new instructions. ?  ?levalbuterol 0.63 MG/3ML nebulizer solution ?Commonly known as: XOPENEX ?Take 3 mLs (0.63 mg total) by nebulization every 4 (four) hours as needed for wheezing or shortness of breath. ?  ?losartan 50 MG tablet ?Commonly known as: COZAAR ?TAKE 1 TABLET(50 MG) BY MOUTH DAILY ?What changed: See the new instructions. ?  ?melatonin 3 MG Tabs tablet ?Take 3 mg by mouth at bedtime as needed (for sleep). ?  ?methotrexate 2.5 MG tablet ?Commonly known as: RHEUMATREX ?Take 4 tablets by mouth once weekly. Caution:Chemotherapy. Protect from light. ?What changed:  ?how much to take ?how to take this ?when to take this ?additional instructions ?  ?methylPREDNISolone 4 MG Tbpk tablet ?Commonly known as: MEDROL DOSEPAK ?Taper as directed ?  ?nitroGLYCERIN 0.4 MG SL tablet ?Commonly known as: NITROSTAT ?Place 1 tablet (0.4 mg total) under the tongue every 5 (five) minutes as needed for chest pain. ?  ?pantoprazole 40 MG tablet ?Commonly known as: PROTONIX ?TAKE 1 TABLET(40 MG) BY MOUTH DAILY ?What changed: See the new instructions. ?  ?traZODone 50 MG tablet ?Commonly known as: DESYREL ?Take 50 mg by mouth at bedtime as needed for sleep. ?  ?Vitamin D3 50 MCG (2000 UT) Tabs ?Take 2,000 Units by mouth daily. ?  ? ?  ? ?  ?  ? ? ?  ?Durable Medical Equipment  ?(From admission, onward)  ?  ? ? ?  ? ?  Start     Ordered  ? 02/26/22 0000  For home use only DME Nebulizer machine       ?Question Answer Comment  ?Patient needs a nebulizer to treat with the following condition COPD (chronic obstructive pulmonary disease) ([place])    ?Length of Need Lifetime   ?  ? 02/26/22 1100  ? ?  ?  ? ?  ? ? ?Allergies  ?Allergen Reactions  ? Betadine [Povidone Iodine] Shortness Of Breath  ? Hydrocodone Itching  ? ? ?Consultations: ?None ? ?Procedures/Studies: ?CT Angio Chest Pulmonary Embolism (PE) W or WO Contrast ? ?Result Date: 02/24/2022 ?CLINICAL DATA:  Acute respiratory failure with positive D-dimer, pulmonary embolism suspected EXAM: CT ANGIOGRAPHY CHEST WITH CONTRAST TECHNIQUE: Multidetector CT imaging of the chest was performed using the standard protocol during bolus administration of intravenous contrast. Multiplanar CT image reconstructions and MIPs were obtained to evaluate the vascular anatomy. RADIATION DOSE REDUCTION: This exam was performed according to the departmental dose-optimization program which includes automated exposure control, adjustment of the mA and/or kV according to patient size and/or use of iterative reconstruction technique. CONTRAST:  49m OMNIPAQUE IOHEXOL 350 MG/ML SOLN COMPARISON:  None. FINDINGS: Cardiovascular: Satisfactory opacification of the pulmonary arteries to the segmental level. No evidence of pulmonary embolism. Atherosclerotic calcifications along the thoracic aorta and coronary arteries. Left ventricular dilation is  present. 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 Left subclavian approach biventricular cardiac rhythm maintenance device. No pericardial effusion. Mediastinum/Nodes: Normal thyroid gland. Mildly prominent mediastinal lymph nodes with Stipe old calcifications. Largest in the left paratracheal station measuring 1.2 cm. Unremarkable thoracic esophagus. Lungs/Pleura: Moderate respiratory motion artifact limits evaluation for small pulmonary nodules. No interlobular septal thickening to suggest pulmonary edema. Elevation of the left hemidiaphragm. Mild subpleural reticulation and architectural distortion, particularly along the lingula. Scattered areas of patchy ground-glass attenuation  airspace opacity. Upper Abdomen: No acute abnormality.  Benign hepatic cyst. Musculoskeletal: No acute osseous abnormality. Remote healed sternal fracture. Review of the MIP images confirms the above findings. IMPRESSION: 1.

## 2022-02-28 LAB — EXPECTORATED SPUTUM ASSESSMENT W GRAM STAIN, RFLX TO RESP C

## 2022-02-28 LAB — CULTURE, BLOOD (ROUTINE X 2)
Culture: NO GROWTH
Culture: NO GROWTH
Special Requests: ADEQUATE
Special Requests: ADEQUATE

## 2022-03-06 DIAGNOSIS — G4700 Insomnia, unspecified: Secondary | ICD-10-CM | POA: Diagnosis not present

## 2022-03-06 DIAGNOSIS — J9600 Acute respiratory failure, unspecified whether with hypoxia or hypercapnia: Secondary | ICD-10-CM | POA: Diagnosis not present

## 2022-03-18 ENCOUNTER — Ambulatory Visit: Payer: Medicare Other | Admitting: Internal Medicine

## 2022-03-18 ENCOUNTER — Encounter: Payer: Self-pay | Admitting: Internal Medicine

## 2022-03-18 VITALS — BP 118/74 | HR 64 | Temp 98.7°F | Ht 62.0 in | Wt 128.4 lb

## 2022-03-18 DIAGNOSIS — R0602 Shortness of breath: Secondary | ICD-10-CM

## 2022-03-18 DIAGNOSIS — M0579 Rheumatoid arthritis with rheumatoid factor of multiple sites without organ or systems involvement: Secondary | ICD-10-CM | POA: Diagnosis not present

## 2022-03-18 DIAGNOSIS — Z87891 Personal history of nicotine dependence: Secondary | ICD-10-CM

## 2022-03-18 MED ORDER — FLUTICASONE-SALMETEROL 115-21 MCG/ACT IN AERO: 2.0000 | INHALATION_SPRAY | Freq: Two times a day (BID) | RESPIRATORY_TRACT | 12 refills | Status: DC

## 2022-03-18 NOTE — Patient Instructions (Addendum)
Please schedule follow up scheduled with myself in 1 months.  If my schedule is not open yet, we will contact you with a reminder closer to that time. Please call [phone] if you haven't heard from [place] a month before.  ? ?Before your next visit I would like you to have: ? ?Full set of PFTs- 1 hour.  ? ?Stop symbicort. Switch to advair 2 puffs twice a day. Gargle after use  ?Keep taking albuterol as needed up to 4 times/day.  ?

## 2022-03-18 NOTE — Progress Notes (Signed)
? ?      ?[name]    [id number]    [birth year] ? ?Primary Care Physician:Timberlake, Anastasia Pall, MD ? ?Referring Physician: Glenis Smoker, MD ?[address] ?[place],  [place] [address] ?Reason for Consultation: shortness of breath ?Date of Consultation: 03/18/2022 ? ?Chief complaint:   ?Chief Complaint  ?Patient presents with  ? Advice Only  ?  SOB, Resp failure in Apri 2023  ?  ? ?HPI: ?[name] is a 76 y.o. woman who presents for new patient evaluation of shortness of breath.  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  ? ?She had a possible positive covid test as an outpatient in early April 2023 and was admitted for hypoxemia and hypercapnia. Covid test negative in the hospital. Symptoms improved with abx and steroids. She was outside the window for antiviral treatment for covid. She was weaned to room air at discharge.  ? ?Since her hospital stay she is feeling ongoing shortness of breath. Her symptoms were actually preceding her recent hospital illness over the past year. Notes dyspnea with exertion going up stairs, walking in the neighborhood, any exertion. Denies cough. She does have wheezing when she gets short of breath.  ? ?She had severe allergies/hay fever as a child and took subcutaneous immunotherapy.  ? ?Previously no hospitalizations for pneumonia or bronchitis prior to this.  ?Daughter notes wheezing.  ? ?She has a symbicort inhaler which she has been on for years and albuterol inhaler. She has been told she has asthma. Feels the symbicort is less effective now. SABA use has increased more recently up to 1-2 times/day. She was given nebulizer treatments when she left the hospital but hasn't continued those.  ? ?She feels improved control in her hay fever and allergies.  ? ?RA is RF+, has joint involvement mostly in her hands and range of motion in upper extremities. Sees Dr. Estanislado Pandy and  she is on methotrexate and plaquenil.  ? ?Social history: ? 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  ?Exposures: grew up outside [employer] Lives at home with her grandson. Daughter lives next door. No pets.  ?Smoking history: 50 pack years, quit in 1995, had passive smoke exposure in childhood as well.  ? ?Social History  ? ?Occupational History  ? Not on file  ?Tobacco Use  ? Smoking status: Former  ?  Packs/day: 2.00  ?  Years: 25.00  ?  Pack years: 50.00  ?  Types: Cigarettes  ?  Quit date: 1995  ?  Years since quitting: 28.3  ? Smokeless tobacco: Never  ?Vaping Use  ? Vaping Use: Never used  ?Substance and Sexual Activity  ? Alcohol use: Yes  ?  Alcohol/week: 14.0 standard drinks  ?  Types: 14 Cans of beer per week  ?  Comment: 2 DAILY  ? Drug use: Never  ? Sexual activity: Not on file  ? ? ?Relevant family history: ? ?Family History  ?Problem Relation Age of Onset  ? Stroke Mother   ? Cancer Father   ?     colon?  ? Asthma Father   ? Pyelonephritis Sister   ? Pulmonary fibrosis Sister   ? Asthma Paternal Grandfather   ? Cancer Paternal Aunt   ? Cancer Paternal Uncle   ? ? ?Past Medical History:  ?Diagnosis Date  ? Anxiety   ?  Asthma   ? Breast cancer ([place])   ? left breast  ? Carpal tunnel syndrome   ? Depression   ? Habitual alcohol use   ? Heart attack ([place])   ? Hypertension   ? Personal history of chemotherapy   ? Personal history of radiation therapy   ? RA (rheumatoid arthritis) ([place])   ? Spinal stenosis   ? Vision abnormalities   ? ? ?Past Surgical History:  ?Procedure Laterality Date  ? BREAST BIOPSY    ? BREAST LUMPECTOMY  2005  ? left  ? CARDIAC CATHETERIZATION N/A 07/04/2016  ? Procedure: Left Heart Cath and Coronary Angiography;  Surgeon: Belva Crome, MD;  Location: [facility];  Service: Cardiovascular;  Laterality: N/A;  ? CARDIAC CATHETERIZATION N/A 07/04/2016  ? Procedure: Temporary Pacemaker;  Surgeon: Belva Crome, MD;  Location:  [facility];  Service: Cardiovascular;  Laterality: N/A;  ? CARPAL TUNNEL RELEASE    ? CATARACT EXTRACTION Bilateral   ? EP IMPLANTABLE DEVICE N/A 07/05/2016  ? Procedure: Pacemaker Implant;  Surgeon: Evans Lance, MD;  Location: [facility];  Service: Cardiovascular;  Laterality: N/A;  ? herniated rectum repair   11/2020  ? LAMINECTOMY  1966  ? LYMPHECTOMY  2005  ? ? ? ?Physical Exam: ?Blood pressure 118/74, pulse 64, temperature 98.7 ?F (37.1 ?C), temperature source Oral, height '5\' 2"'$  (1.575 m), weight 128 lb 6.4 oz (58.2 kg), SpO2 94 %. ?Gen:      No acute distress ?ENT:  no nasal polyps, mucus membranes moist ?Lungs:    Diminished bilaterally, No increased respiratory effort, symmetric chest wall excursion, clear to auscultation bilaterally, no wheezes or crackles ?CV:         Regular rate and rhythm; no murmurs, rubs, or gallops.  No pedal edema ?Abd:      + bowel sounds; soft, non-tender; no distension ?MSK: ulnar deviation and swan hands deformity consistent with RA ?Skin:      Warm and dry; no rashes ?Neuro: normal speech, no focal facial asymmetry ?Psych: alert and oriented x3, normal mood and affect ? ? ?Data Reviewed/Medical Decision Making: ? ?Independent interpretation of tests: ?Imaging: ? Review of patient's CTPE study images revealed no PE - mild subpleural reticulation. Diffuse GGOs. The patient's images have been independently reviewed by me.   ? ?PFTs: ?I have personally reviewed the patient's PFTs and  ?   ? View : No data to display.  ?  ?  ?  ? ?Echocardiogram shows normal LVEF and RV function, there is mild mitral valve regurgitation ? ? ?Labs:  ?Lab Results  ?Component Value Date  ? WBC 13.5 (H) 02/26/2022  ? HGB 13.1 02/26/2022  ? HCT 38.6 02/26/2022  ? MCV 95.1 02/26/2022  ? PLT 289 02/26/2022  ? ?Lab Results  ?Component Value Date  ? NA 136 02/26/2022  ? K 3.7 02/26/2022  ? CL 104 02/26/2022  ? CO2 25 02/26/2022  ? ? ? ?Immunization status:  ?Immunization History   ?Administered Date(s) Administered  ? Fluad Quad(high Dose 65+) 08/16/2019  ? Influenza Split 09/10/2007, 09/05/2009, 08/05/2011, 09/15/2012, 08/12/2013  ? Influenza Whole 08/21/2009, 08/29/2010  ? Influenza,inj,Quad PF,6+ Mos 08/21/2016, 09/24/2017, 07/27/2018, 08/21/2020  ? Influenza,inj,quad, With Preservative 08/01/2014, 08/16/2015  ? PFIZER(Purple Top)SARS-COV-2 Vaccination 01/14/2020, 02/08/2020, 08/08/2020, 04/09/2021  ? Pneumococcal Conjugate-13 08/01/2014  ? Pneumococcal Polysaccharide-23 05/08/2009  ? Td 01/26/2018  ? Tdap 05/06/2007  ? Zoster, Live 06/16/2009  ? ? ? I reviewed prior  external note(s) from hospital stay ? I reviewed the result(s) of the labs and imaging as noted above.  ? I have ordered PFT ? ? ?Assessment:  ?Shortness of breath ?History of Tobacco use ?RA +RF on MTX ? ? ?Plan/Recommendations: ?Will obtain PFT.  ?Differential diagnosis includes smoking related lung disease, less likely RA-ILD. There is some vague GGOs and subpleural reticulation but no frank fibrosis or crackles on exam.  ?Will increase ICS with advair and continue prn albuterol.  ?We discussed disease management and progression at length today.  ?Progression of RA-ILD is generally hastened in the setting of tobacco use.  ? ?Return to Care: ?Return in about 4 weeks (around 04/15/2022). ? ?Lenice Llamas, MD ?Pulmonary and Critical Care Medicine ?[facility] ?[phone] ? ?CC: Glenis Smoker, * ? ? ? ?

## 2022-03-22 ENCOUNTER — Other Ambulatory Visit: Payer: Self-pay | Admitting: Family Medicine

## 2022-03-22 DIAGNOSIS — Z1231 Encounter for screening mammogram for malignant neoplasm of breast: Secondary | ICD-10-CM

## 2022-04-02 ENCOUNTER — Ambulatory Visit
Admission: RE | Admit: 2022-04-02 | Discharge: 2022-04-02 | Disposition: A | Discharge status: Home / Self Care | Payer: Medicare Other | Source: Ambulatory Visit | Attending: Family Medicine | Admitting: Family Medicine

## 2022-04-02 DIAGNOSIS — Z1231 Encounter for screening mammogram for malignant neoplasm of breast: Secondary | ICD-10-CM

## 2022-04-03 ENCOUNTER — Other Ambulatory Visit: Payer: Self-pay | Admitting: Family Medicine

## 2022-04-03 DIAGNOSIS — R928 Other abnormal and inconclusive findings on diagnostic imaging of breast: Secondary | ICD-10-CM

## 2022-04-09 ENCOUNTER — Other Ambulatory Visit: Payer: Self-pay | Admitting: Family Medicine

## 2022-04-09 ENCOUNTER — Ambulatory Visit
Admission: RE | Admit: 2022-04-09 | Discharge: 2022-04-09 | Disposition: A | Discharge status: Home / Self Care | Payer: Medicare Other | Source: Ambulatory Visit | Attending: Family Medicine | Admitting: Family Medicine

## 2022-04-09 DIAGNOSIS — R921 Mammographic calcification found on diagnostic imaging of breast: Secondary | ICD-10-CM | POA: Diagnosis not present

## 2022-04-09 DIAGNOSIS — R928 Other abnormal and inconclusive findings on diagnostic imaging of breast: Secondary | ICD-10-CM

## 2022-04-10 ENCOUNTER — Encounter: Payer: Self-pay | Admitting: Interventional Cardiology

## 2022-04-12 DIAGNOSIS — Z961 Presence of intraocular lens: Secondary | ICD-10-CM | POA: Diagnosis not present

## 2022-04-12 DIAGNOSIS — Z79899 Other long term (current) drug therapy: Secondary | ICD-10-CM | POA: Diagnosis not present

## 2022-04-17 ENCOUNTER — Other Ambulatory Visit: Payer: Self-pay | Admitting: Physician Assistant

## 2022-04-17 NOTE — Telephone Encounter (Signed)
Next Visit: 05/08/2022  Last Visit: 11/22/2021  Last Fill: 01/17/2022  DX: Rheumatoid arthritis involving multiple sites with positive rheumatoid factor   Current Dose per office note 11/22/2021: Methotrexate 4 tablets every 7 days  Labs: 02/26/2022 Calcium 8.6, WBC 13.5  Okay to refill MTX?

## 2022-04-19 ENCOUNTER — Ambulatory Visit
Admission: RE | Admit: 2022-04-19 | Discharge: 2022-04-19 | Disposition: A | Discharge status: Home / Self Care | Payer: Medicare Other | Source: Ambulatory Visit | Attending: Family Medicine | Admitting: Family Medicine

## 2022-04-19 DIAGNOSIS — R921 Mammographic calcification found on diagnostic imaging of breast: Secondary | ICD-10-CM

## 2022-04-19 DIAGNOSIS — N6323 Unspecified lump in the left breast, lower outer quadrant: Secondary | ICD-10-CM | POA: Diagnosis not present

## 2022-04-23 DIAGNOSIS — E785 Hyperlipidemia, unspecified: Secondary | ICD-10-CM | POA: Diagnosis not present

## 2022-04-23 DIAGNOSIS — R7303 Prediabetes: Secondary | ICD-10-CM | POA: Diagnosis not present

## 2022-04-23 DIAGNOSIS — Z23 Encounter for immunization: Secondary | ICD-10-CM | POA: Diagnosis not present

## 2022-04-23 DIAGNOSIS — R5383 Other fatigue: Secondary | ICD-10-CM | POA: Diagnosis not present

## 2022-04-23 DIAGNOSIS — Z0000 Encounter for general adult medical examination without abnormal findings: Secondary | ICD-10-CM | POA: Diagnosis not present

## 2022-04-23 DIAGNOSIS — I10 Essential (primary) hypertension: Secondary | ICD-10-CM | POA: Diagnosis not present

## 2022-04-23 DIAGNOSIS — E611 Iron deficiency: Secondary | ICD-10-CM | POA: Diagnosis not present

## 2022-04-24 ENCOUNTER — Ambulatory Visit: Payer: Medicare Other | Admitting: Rheumatology

## 2022-05-01 ENCOUNTER — Encounter: Payer: Self-pay | Admitting: Internal Medicine

## 2022-05-01 ENCOUNTER — Ambulatory Visit: Payer: Medicare Other | Admitting: Internal Medicine

## 2022-05-01 ENCOUNTER — Ambulatory Visit (INDEPENDENT_AMBULATORY_CARE_PROVIDER_SITE_OTHER): Payer: Medicare Other | Admitting: Internal Medicine

## 2022-05-01 VITALS — BP 126/50 | HR 91 | Temp 98.3°F | Ht 62.0 in | Wt 127.4 lb

## 2022-05-01 DIAGNOSIS — J849 Interstitial pulmonary disease, unspecified: Secondary | ICD-10-CM

## 2022-05-01 DIAGNOSIS — M069 Rheumatoid arthritis, unspecified: Secondary | ICD-10-CM | POA: Diagnosis not present

## 2022-05-01 DIAGNOSIS — R0602 Shortness of breath: Secondary | ICD-10-CM

## 2022-05-01 DIAGNOSIS — Z87891 Personal history of nicotine dependence: Secondary | ICD-10-CM

## 2022-05-01 LAB — PULMONARY FUNCTION TEST
DL/VA % pred: 75 %
DL/VA: 3.13 ml/min/mmHg/L
DLCO cor % pred: 58 %
DLCO cor: 10.33 ml/min/mmHg
DLCO unc % pred: 58 %
DLCO unc: 10.33 ml/min/mmHg
FEF 25-75 Post: 1.37 L/sec
FEF 25-75 Pre: 1.24 L/sec
FEF2575-%Change-Post: 10 %
FEF2575-%Pred-Post: 98 %
FEF2575-%Pred-Pre: 88 %
FEV1-%Change-Post: 3 %
FEV1-%Pred-Post: 82 %
FEV1-%Pred-Pre: 79 %
FEV1-Post: 1.5 L
FEV1-Pre: 1.45 L
FEV1FVC-%Change-Post: 0 %
FEV1FVC-%Pred-Pre: 104 %
FEV6-%Change-Post: 5 %
FEV6-%Pred-Post: 83 %
FEV6-%Pred-Pre: 79 %
FEV6-Post: 1.93 L
FEV6-Pre: 1.84 L
FEV6FVC-%Pred-Post: 105 %
FEV6FVC-%Pred-Pre: 105 %
FVC-%Change-Post: 3 %
FVC-%Pred-Post: 78 %
FVC-%Pred-Pre: 76 %
FVC-Post: 1.93 L
FVC-Pre: 1.88 L
Post FEV1/FVC ratio: 77 %
Post FEV6/FVC ratio: 100 %
Pre FEV1/FVC ratio: 77 %
Pre FEV6/FVC Ratio: 100 %
RV % pred: 98 %
RV: 2.21 L
TLC % pred: 88 %
TLC: 4.21 L

## 2022-05-01 MED ORDER — BREZTRI AEROSPHERE 160-9-4.8 MCG/ACT IN AERO: 2.0000 | INHALATION_SPRAY | Freq: Two times a day (BID) | RESPIRATORY_TRACT | 5 refills | Status: DC

## 2022-05-01 NOTE — Patient Instructions (Addendum)
Please schedule follow up scheduled with myself in 3 months.  If my schedule is not open yet, we will contact you with a reminder closer to that time. Please call [phone] if you haven't heard from [place] a month before.   Before your next visit I would like you to have: CT scan  Stop advair. Start breztri inhaler 2 puffs twice a day.   I will call you with results of CT scan and let you know if there is any evidence of scarring in the lungs that could be related to your RA.

## 2022-05-01 NOTE — Progress Notes (Signed)
PFT done today. 

## 2022-05-01 NOTE — Progress Notes (Addendum)
[name]    [id number]    [birth year]  Primary Care Physician:Timberlake, Anastasia Pall, MD Date of Appointment: 05/01/2022 Established Patient Visit  Chief complaint:   Chief Complaint  Patient presents with   Follow-up    Get PFT results.     HPI: [name] is a 76 y.o. woman with history of tobacco use disorder, rheumatoid arthritis here with Shortness of breath  Interval Updates: Here for folow up after PFTs. Still having dyspnea with minimal exertion such as household activities. Some improvement with albuterol. Still wheezing. She does take albuterol once every other day if she has wheezing. It does help her.   No fevers or chills.   I have reviewed the patient's family social and past medical history and updated as appropriate.   Past Medical History:  Diagnosis Date   Anxiety    Asthma    Breast cancer ([facility])    left breast   Carpal tunnel syndrome    Depression    Habitual alcohol use    Heart attack ([place])    Hypertension    Personal history of chemotherapy    Personal history of radiation therapy    RA (rheumatoid arthritis) ([place])    Spinal stenosis    Vision abnormalities     Past Surgical History:  Procedure Laterality Date   BREAST BIOPSY     BREAST LUMPECTOMY  2005   left   CARDIAC CATHETERIZATION N/A 07/04/2016   Procedure: Left Heart Cath and Coronary Angiography;  Surgeon: Belva Crome, MD;  Location: [facility];  Service: Cardiovascular;  Laterality: N/A;   CARDIAC CATHETERIZATION N/A 07/04/2016   Procedure: Temporary Pacemaker;  Surgeon: Belva Crome, MD;  Location: [facility];  Service: Cardiovascular;  Laterality: N/A;   CARPAL TUNNEL RELEASE     CATARACT EXTRACTION Bilateral    EP IMPLANTABLE DEVICE N/A 07/05/2016   Procedure: Pacemaker Implant;  Surgeon: Evans Lance, MD;  Location: [facility];  Service: Cardiovascular;  Laterality: N/A;   herniated rectum repair   11/2020   LAMINECTOMY  1966    LYMPHECTOMY  2005    Family History  Problem Relation Age of Onset   Stroke Mother    Cancer Father        colon?   Asthma Father    Pyelonephritis Sister    Pulmonary fibrosis Sister    Asthma Paternal Grandfather    Cancer Paternal Aunt    Cancer Paternal Uncle     Social History   Occupational History   Not on file  Tobacco Use   Smoking status: Former    Packs/day: 2.00    Years: 25.00    Total pack years: 50.00    Types: Cigarettes    Quit date: 1995    Years since quitting: 28.4   Smokeless tobacco: Never  Vaping Use   Vaping Use: Never used  Substance and Sexual Activity   Alcohol use: Yes    Alcohol/week: 14.0 standard drinks of alcohol    Types: 14 Cans of beer per week    Comment: 2 DAILY   Drug use: Never   Sexual activity: Not on file     Physical Exam: Blood pressure (!) 126/50, pulse 91, temperature 98.3 F (36.8 C), temperature source Oral, height '5\' 2"'$  (1.575 m), weight 127 lb 6.4 oz (57.8 kg), SpO2 99 %.  Gen:      No acute distress ENT:  no nasal polyps, mucus membranes moist Lungs:    No increased respiratory effort, symmetric chest wall excursion, clear to auscultation bilaterally, no wheezes or crackles CV:         Regular rate and rhythm; no murmurs, rubs, or gallops.  No pedal edema   Data Reviewed: Imaging: I have personally reviewed the CT scan from April 2023 - shows scattered ground glass an dmaybe some early fibrotic changes but there is a lot of motion artifact which limits study. Chest xray confirms left diaphragm eventration  PFTs:     Latest Ref Rng & Units 05/01/2022   12:05 PM  PFT Results  FVC-Pre L 1.88  P  FVC-Predicted Pre % 76  P  FVC-Post L 1.93  P  FVC-Predicted Post % 78  P  Pre FEV1/FVC % % 77  P  Post FEV1/FCV % % 77  P  FEV1-Pre L 1.45  P  FEV1-Predicted Pre % 79  P  FEV1-Post L 1.50  P  DLCO uncorrected ml/min/mmHg 10.33  P  DLCO UNC% % 58  P  DLCO corrected ml/min/mmHg 10.33  P  DLCO COR  %Predicted % 58  P  DLVA Predicted % 75  P  TLC L 4.21  P  TLC % Predicted % 88  P  RV % Predicted % 98  P    P Preliminary result   I have personally reviewed the patient's PFTs and normal spirometry and lung volumes, mildly reduced diffusion capacity.   Labs: Lab Results  Component Value Date   WBC 13.5 (H) 02/26/2022   HGB 13.1 02/26/2022   HCT 38.6 02/26/2022   MCV 95.1 02/26/2022   PLT 289 02/26/2022   Lab Results  Component Value Date   NA 136 02/26/2022   K 3.7 02/26/2022   CL 104 02/26/2022   CO2 25 02/26/2022    Immunization status: Immunization History  Administered Date(s) Administered   Fluad Quad(high Dose 65+) 08/16/2019   Influenza Split 09/10/2007, 09/05/2009, 08/05/2011, 09/15/2012, 08/12/2013   Influenza Whole 08/21/2009, 08/29/2010   Influenza, High Dose Seasonal PF 08/30/2021   Influenza,inj,Quad PF,6+ Mos 08/21/2016, 09/24/2017, 07/27/2018, 08/21/2020   Influenza,inj,quad, With Preservative 08/01/2014, 08/16/2015   PFIZER(Purple Top)SARS-COV-2 Vaccination 01/14/2020, 02/08/2020, 08/08/2020, 04/09/2021   Pneumococcal Conjugate-13 08/01/2014   Pneumococcal Polysaccharide-23 05/08/2009, 04/23/2022   Td 01/26/2018   Tdap 05/06/2007   Zoster, Live 06/16/2009    External Records Personally Reviewed: ED, PCP  Assessment:  Shortness of breath Possible ILD History of tobacco use disorder Left hemidiaphragm eventration  Plan/Recommendations:  Had  improvement with advair so potential this is smoking related lung disease.  Will stop advair and switch to breztri.  Will obtain high res ct chest to better understand lung parenchyma and evaluate for ILD.  RA-ILD is still a consideration.  Encouraged her to take albuterol more as needed to help with wheezing and symptom management.     Return to Care: Return in about 3 months (around 08/01/2022).   Lenice Llamas, MD Pulmonary and [place]

## 2022-05-02 NOTE — Progress Notes (Unsigned)
Office Visit Note  Patient: [name]             Date of Birth: 03/17/1946           MRN: [id number]             PCP: Glenis Smoker, MD Referring: Glenis Smoker, * Visit Date: 05/08/2022 Occupation: '@GUAROCC'$ @  Subjective:  No chief complaint on file.   History of Present Illness: [name] is a 76 y.o. female ***with history of seropositive rheumatoid arthritis and osteoarthritis.  She returns today after her last visit on June 08, 2021.  She was hospitalized in April 2023 and respiratory failure.  She tested negative for COVID-19 and flu.  After the discharge from the hospital she was followed by Dr. Shearon Stalls on May 01, 2022.  There was suspicion about possible ILD.  High-resolution CT is pending.  Activities of Daily Living:  Patient reports morning stiffness for *** {minute/hour:19697}.   Patient {ACTIONS;DENIES/REPORTS:21021675::"Denies"} nocturnal pain.  Difficulty dressing/grooming: {ACTIONS;DENIES/REPORTS:21021675::"Denies"} Difficulty climbing stairs: {ACTIONS;DENIES/REPORTS:21021675::"Denies"} Difficulty getting out of chair: {ACTIONS;DENIES/REPORTS:21021675::"Denies"} Difficulty using hands for taps, buttons, cutlery, and/or writing: {ACTIONS;DENIES/REPORTS:21021675::"Denies"}  No Rheumatology ROS completed.   PMFS History:  Patient Active Problem List   Diagnosis Date Noted   Acute respiratory failure with hypoxia ([place]) 02/24/2022   GERD (gastroesophageal reflux disease) 02/24/2022   Elevated troponin 02/24/2022   Bilateral carpal tunnel syndrome 04/14/2018   Cervical spinal stenosis 01/07/2018   Cervical radiculopathy 01/07/2018   Stenosis of left subclavian artery ([place]) 12/15/2017   History of gastroesophageal reflux (GERD) 06/25/2017   History of bilateral carpal tunnel release 06/25/2017   Former smoker 01/25/2017   Rheumatoid arthritis involving multiple sites with positive rheumatoid factor ([place]) 01/17/2017   High risk medication  use 01/17/2017   Primary osteoarthritis of both hands 01/17/2017   Primary osteoarthritis of both feet 01/17/2017   DJD (degenerative joint disease), cervical 01/17/2017   Spondylosis of lumbar region without myelopathy or radiculopathy 01/17/2017   Cardiomyopathy, ischemic 07/07/2016   CAD- S/P LAD thrombectomy 07/04/16 07/07/2016   Pacemaker-MDT BiV placed 07/05/16 07/07/2016   History of rheumatoid arthritis 07/07/2016   Complete heart block ([place]) 07/04/2016   Essential hypertension 07/04/2016   LBBB (left bundle branch block) 07/04/2016   Exostosis 06/18/2011   History of breast cancer 02/16/2010   Vitamin D deficiency 02/15/2010   HYPERCHOLESTEROLEMIA 02/15/2010   Anxiety state 02/15/2010   Asthma exacerbation 02/15/2010   MIGRAINES, HX OF 02/15/2010   MASTECTOMY, LEFT, HX OF 02/15/2010    Past Medical History:  Diagnosis Date   Anxiety    Asthma    Breast cancer ([place])    left breast   Carpal tunnel syndrome    Depression    Habitual alcohol use    Heart attack ([place])    Hypertension    Personal history of chemotherapy    Personal history of radiation therapy    RA (rheumatoid arthritis) ([place])    Spinal stenosis    Vision abnormalities     Family History  Problem Relation Age of Onset   Stroke Mother    Cancer Father        colon?   Asthma Father    Pyelonephritis Sister    Pulmonary fibrosis Sister    Asthma Paternal Grandfather    Cancer Paternal Aunt    Cancer Paternal Uncle    Past Surgical History:  Procedure Laterality Date   BREAST BIOPSY     BREAST LUMPECTOMY  2005  left   CARDIAC CATHETERIZATION N/A 07/04/2016   Procedure: Left Heart Cath and Coronary Angiography;  Surgeon: Belva Crome, MD;  Location: [facility];  Service: Cardiovascular;  Laterality: N/A;   CARDIAC CATHETERIZATION N/A 07/04/2016   Procedure: Temporary Pacemaker;  Surgeon: Belva Crome, MD;  Location: [facility];  Service: Cardiovascular;  Laterality: N/A;    CARPAL TUNNEL RELEASE     CATARACT EXTRACTION Bilateral    EP IMPLANTABLE DEVICE N/A 07/05/2016   Procedure: Pacemaker Implant;  Surgeon: Evans Lance, MD;  Location: [facility];  Service: Cardiovascular;  Laterality: N/A;   herniated rectum repair   11/2020   LAMINECTOMY  1966   LYMPHECTOMY  2005   Social History   Social History Narrative   Not on file   Immunization History  Administered Date(s) Administered   Fluad Quad(high Dose 65+) 08/16/2019   Influenza Split 09/10/2007, 09/05/2009, 08/05/2011, 09/15/2012, 08/12/2013   Influenza Whole 08/21/2009, 08/29/2010   Influenza, High Dose Seasonal PF 08/30/2021   Influenza,inj,Quad PF,6+ Mos 08/21/2016, 09/24/2017, 07/27/2018, 08/21/2020   Influenza,inj,quad, With Preservative 08/01/2014, 08/16/2015   PFIZER(Purple Top)SARS-COV-2 Vaccination 01/14/2020, 02/08/2020, 08/08/2020, 04/09/2021   Pneumococcal Conjugate-13 08/01/2014   Pneumococcal Polysaccharide-23 05/08/2009, 04/23/2022   Td 01/26/2018   Tdap 05/06/2007   Zoster, Live 06/16/2009     Objective: Vital Signs: There were no vitals taken for this visit.   Physical Exam   Musculoskeletal Exam: ***  CDAI Exam: CDAI Score: -- Patient Global: --; Provider Global: -- Swollen: --; Tender: -- Joint Exam 05/08/2022   No joint exam has been documented for this visit   There is currently no information documented on the homunculus. Go to the Rheumatology activity and complete the homunculus joint exam.  Investigation: No additional findings.  Imaging: MM LT BREAST BX W LOC DEV 1ST LESION IMAGE BX SPEC STEREO GUIDE  Addendum Date: 04/24/2022   ADDENDUM REPORT: 04/24/2022 08:08 ADDENDUM: Pathology revealed Breast, LEFT, needle core biopsy, lower outer quadrant- FIBROADENOMATOID NODULE WITH CALCIFICATIONS- NEGATIVE FOR MALIGNANCY. This was found to be concordant by Dr. Lillia Mountain. Pathology results were discussed with the patient by telephone with Jonelle Sports  RN. The patient reported doing well after the biopsy with tenderness at the site. Post biopsy instructions and care were reviewed and questions were answered. The patient was encouraged to call [facility] for any additional concerns. The patient was instructed to return for annual screening mammography and informed a reminder notice would be sent regarding this appointment. Pathology results reported by Stacie Acres RN on 04/23/2022. Electronically Signed   By: Lillia Mountain M.D.   On: 04/24/2022 08:08   Result Date: 04/24/2022 CLINICAL DATA:  Indeterminate left breast calcifications. EXAM: LEFT BREAST STEREOTACTIC CORE NEEDLE BIOPSY COMPARISON:  With priors. FINDINGS: The patient and I discussed the procedure of stereotactic-guided biopsy including benefits and alternatives. We discussed the high likelihood of a successful procedure. We discussed the risks of the procedure including infection, bleeding, tissue injury, clip migration, and inadequate sampling. Informed written consent was given. The usual time out protocol was performed immediately prior to the procedure. Using sterile technique and 1% lidocaine and 1% lidocaine with epinephrine as local anesthetic, under stereotactic guidance, a 9 gauge vacuum assisted device was used to perform core needle biopsy of calcifications in the lower outer quadrant of the left breast using a lateral to medial approach. Specimen radiograph was performed showing calcifications are present in the tissue samples. Specimens  with calcifications are identified for pathology. Lesion quadrant: Lower outer quadrant At the conclusion of the procedure, X shaped tissue marker clip was deployed into the biopsy cavity. Follow-up 2-view mammogram was performed and dictated separately. IMPRESSION: Stereotactic-guided biopsy of the left breast. No apparent complications. Electronically Signed: By: Lillia Mountain M.D. On: 04/19/2022 11:07  MM CLIP PLACEMENT  LEFT  Result Date: 04/19/2022 CLINICAL DATA:  Status post stereotactic biopsy of left breast calcifications. EXAM: 3D DIAGNOSTIC LEFT MAMMOGRAM POST ULTRASOUND BIOPSY COMPARISON:  Previous exam(s). FINDINGS: 3D Mammographic images were obtained following stereotactic guided biopsy of the left breast. The biopsy marking clip is in expected location in the lateral aspect of the left breast. IMPRESSION: Appropriate positioning of the X shaped biopsy marking clip at the site of biopsy in the lower outer quadrant of the left breast. Final Assessment: Post Procedure Mammograms for Marker Placement Electronically Signed   By: Lillia Mountain M.D.   On: 04/19/2022 11:17  MM Digital Diagnostic Unilat L  Result Date: 04/09/2022 CLINICAL DATA:  76 year old female for further evaluation of LEFT breast calcifications identified on screening mammogram. History of LEFT breast cancer and lumpectomy in 2005. EXAM: DIGITAL DIAGNOSTIC UNILATERAL LEFT MAMMOGRAM WITH CAD TECHNIQUE: Left digital diagnostic mammography was performed. Mammographic images were processed with CAD. COMPARISON:  Previous exam(s). ACR Breast Density Category c: The breast tissue is heterogeneously dense, which may obscure small masses. FINDINGS: Full field and magnification views of the LEFT breast demonstrate a 0.5 cm group of heterogeneous calcifications within the OUTER LEFT breast, middle to posterior depth. IMPRESSION: 1. Indeterminate 0.5 cm group of OUTER LEFT breast calcifications. Tissue sampling is recommended. RECOMMENDATION: 3D/stereotactic guided LEFT breast biopsy, which will be scheduled. I have discussed the findings and recommendations with the patient. If applicable, a reminder letter will be sent to the patient regarding the next appointment. BI-RADS CATEGORY  4: Suspicious. Electronically Signed   By: Margarette Canada M.D.   On: 04/09/2022 16:32   Recent Labs: Lab Results  Component Value Date   WBC 13.5 (H) 02/26/2022   HGB 13.1  02/26/2022   PLT 289 02/26/2022   NA 136 02/26/2022   K 3.7 02/26/2022   CL 104 02/26/2022   CO2 25 02/26/2022   GLUCOSE 99 02/26/2022   BUN 15 02/26/2022   CREATININE 0.55 02/26/2022   BILITOT 0.8 02/23/2022   ALKPHOS 53 02/23/2022   AST 51 (H) 02/23/2022   ALT 28 02/23/2022   PROT 6.8 02/23/2022   ALBUMIN 3.3 (L) 02/23/2022   CALCIUM 8.6 (L) 02/26/2022   GFRAA 108 12/26/2020   Per Dr. Mauricio Po notes CT scan from April 2023 - shows scattered ground glass an dmaybe some early fibrotic changes but there is a lot of motion artifact which limits study. Chest xray confirms left diaphragm eventration I have personally reviewed the patient's PFTs and normal spirometry and lung volumes, mildly reduced diffusion capacity.   Speciality Comments: PLQ Eye Exam: 04/12/2022 WNL @ [facility] f/u 12  months.    Procedures:  No procedures performed Allergies: Betadine [povidone iodine] and Hydrocodone   Assessment / Plan:     Visit Diagnoses: Rheumatoid arthritis involving multiple sites with positive rheumatoid factor (HCC)  High risk medication use  Arthritis of right acromioclavicular joint  Contracture of right elbow  Primary osteoarthritis of both hands  Trigger finger, right middle finger  Primary osteoarthritis of right knee  Primary osteoarthritis of both feet  DDD (degenerative disc disease), cervical  DDD (degenerative disc disease),  lumbar  Osteoporosis screening  History of vitamin D deficiency  Shortness of breath - Patient was evaluated by Dr. Shearon Stalls on May 02, 2022.  There was concern about possible ILD related to RA.  High-resolution CT pending.  History of asthma  History of hypertension  History of left bundle branch block  History of ventricular tachycardia  History of hyperlipidemia  History of breast cancer  History of gastroesophageal reflux (GERD)  History of anxiety  Orders: No orders of the defined types were placed in this  encounter.  No orders of the defined types were placed in this encounter.   Face-to-face time spent with patient was *** minutes. Greater than 50% of time was spent in counseling and coordination of care.  Follow-Up Instructions: No follow-ups on file.   Bo Merino, MD  Note - This record has been created using Editor, commissioning.  Chart creation errors have been sought, but may not always  have been located. Such creation errors do not reflect on  the standard of medical care.

## 2022-05-08 ENCOUNTER — Encounter: Payer: Self-pay | Admitting: Rheumatology

## 2022-05-08 ENCOUNTER — Ambulatory Visit: Payer: Medicare Other | Admitting: Rheumatology

## 2022-05-08 VITALS — BP 126/66 | HR 82 | Resp 12 | Ht 62.0 in | Wt 129.6 lb

## 2022-05-08 DIAGNOSIS — M19042 Primary osteoarthritis, left hand: Secondary | ICD-10-CM

## 2022-05-08 DIAGNOSIS — M1711 Unilateral primary osteoarthritis, right knee: Secondary | ICD-10-CM

## 2022-05-08 DIAGNOSIS — Z13820 Encounter for screening for osteoporosis: Secondary | ICD-10-CM

## 2022-05-08 DIAGNOSIS — R0602 Shortness of breath: Secondary | ICD-10-CM

## 2022-05-08 DIAGNOSIS — M5136 Other intervertebral disc degeneration, lumbar region: Secondary | ICD-10-CM

## 2022-05-08 DIAGNOSIS — M19072 Primary osteoarthritis, left ankle and foot: Secondary | ICD-10-CM

## 2022-05-08 DIAGNOSIS — M19041 Primary osteoarthritis, right hand: Secondary | ICD-10-CM

## 2022-05-08 DIAGNOSIS — M7062 Trochanteric bursitis, left hip: Secondary | ICD-10-CM

## 2022-05-08 DIAGNOSIS — Z8679 Personal history of other diseases of the circulatory system: Secondary | ICD-10-CM

## 2022-05-08 DIAGNOSIS — M0579 Rheumatoid arthritis with rheumatoid factor of multiple sites without organ or systems involvement: Secondary | ICD-10-CM | POA: Diagnosis not present

## 2022-05-08 DIAGNOSIS — M65331 Trigger finger, right middle finger: Secondary | ICD-10-CM

## 2022-05-08 DIAGNOSIS — M19071 Primary osteoarthritis, right ankle and foot: Secondary | ICD-10-CM

## 2022-05-08 DIAGNOSIS — Z79899 Other long term (current) drug therapy: Secondary | ICD-10-CM | POA: Diagnosis not present

## 2022-05-08 DIAGNOSIS — R9389 Abnormal findings on diagnostic imaging of other specified body structures: Secondary | ICD-10-CM | POA: Diagnosis not present

## 2022-05-08 DIAGNOSIS — M24521 Contracture, right elbow: Secondary | ICD-10-CM

## 2022-05-08 DIAGNOSIS — Z8709 Personal history of other diseases of the respiratory system: Secondary | ICD-10-CM

## 2022-05-08 DIAGNOSIS — M19011 Primary osteoarthritis, right shoulder: Secondary | ICD-10-CM

## 2022-05-08 DIAGNOSIS — M5030 Other cervical disc degeneration, unspecified cervical region: Secondary | ICD-10-CM | POA: Diagnosis not present

## 2022-05-08 DIAGNOSIS — Z853 Personal history of malignant neoplasm of breast: Secondary | ICD-10-CM

## 2022-05-08 DIAGNOSIS — Z8659 Personal history of other mental and behavioral disorders: Secondary | ICD-10-CM

## 2022-05-08 DIAGNOSIS — Z87891 Personal history of nicotine dependence: Secondary | ICD-10-CM

## 2022-05-08 DIAGNOSIS — Z8719 Personal history of other diseases of the digestive system: Secondary | ICD-10-CM

## 2022-05-08 DIAGNOSIS — Z8639 Personal history of other endocrine, nutritional and metabolic disease: Secondary | ICD-10-CM

## 2022-05-08 MED ORDER — TRIAMCINOLONE ACETONIDE 40 MG/ML IJ SUSP
40.0000 mg | INTRAMUSCULAR | Status: AC | PRN
  Administered 2022-05-08: 40 mg via INTRA_ARTICULAR

## 2022-05-08 MED ORDER — LIDOCAINE HCL 1 % IJ SOLN
1.5000 mL | INTRAMUSCULAR | Status: AC | PRN
  Administered 2022-05-08: 1.5 mL

## 2022-05-08 NOTE — Patient Instructions (Addendum)
Standing Labs We placed an order today for your standing lab work.   Please have your standing labs drawn in September and every 3 months  If possible, please have your labs drawn 2 weeks prior to your appointment so that the provider can discuss your results at your appointment.  Please note that you may see your imaging and lab results in [place] before we have reviewed them. We may be awaiting multiple results to interpret others before contacting you. Please allow our office up to 72 hours to thoroughly review all of the results before contacting the office for clarification of your results.  We have open lab daily: Monday through Thursday from 1:30-4:30 PM and Friday from 1:30-4:00 PM at the office of Dr. Bo Merino, [facility].   Please be advised, all patients with office appointments requiring lab work will take precedent over walk-in lab work.  If possible, please come for your lab work on Monday and Friday afternoons, as you may experience shorter wait times. The office is located at [address] [place], [place], [place] [address] No appointment is necessary.   Labs are drawn by [facility]. Please bring your co-pay at the time of your lab draw.  You may receive a bill from [facility] for your lab work.  Please note if you are on Hydroxychloroquine and and an order has been placed for a Hydroxychloroquine level, you will need to have it drawn 4 hours or more after your last dose.  If you wish to have your labs drawn at another location, please call the office 24 hours in advance to send orders.  If you have any questions regarding directions or hours of operation,  please call [phone].   As a reminder, please drink plenty of water prior to coming for your lab work. Thanks!   Vaccines You are taking a medication(s) that can suppress your immune system.  The following immunizations are recommended: Flu annually Covid-19  Td/Tdap (tetanus, diphtheria,  pertussis) every 10 years Pneumonia (Prevnar 15 then Pneumovax 23 at least 1 year apart.  Alternatively, can take Prevnar 20 without needing additional dose) Shingrix: 2 doses from 4 weeks to 6 months apart  Please check with your PCP to make sure you are up to date.   If you have signs or symptoms of an infection or start antibiotics: First, call your PCP for workup of your infection. Hold your medication through the infection, until you complete your antibiotics, and until symptoms resolve if you take the following: Injectable medication (Actemra, Benlysta, Cimzia, Cosentyx, Enbrel, Humira, Kevzara, Orencia, Remicade, Simponi, Stelara, Taltz, Tremfya) Methotrexate Leflunomide (Arava) Mycophenolate (Cellcept) Roma Kayser, or Rinvoq  Cervical Strain and Sprain Rehab Ask your health care provider which exercises are safe for you. Do exercises exactly as told by your health care provider and adjust them as directed. It is normal to feel mild stretching, pulling, tightness, or discomfort as you do these exercises. Stop right away if you feel sudden pain or your pain gets worse. Do not begin these exercises until told by your health care provider. Stretching and range-of-motion exercises Cervical side bending  Using good posture, sit on a stable chair or stand up. Without moving your shoulders, slowly tilt your left / right ear to your shoulder until you feel a stretch in the opposite side neck muscles. You should be looking straight ahead. Hold for __________ seconds. Repeat with the other side of your neck. Repeat __________ times. Complete this exercise __________ times a  day. Cervical rotation  Using good posture, sit on a stable chair or stand up. Slowly turn your head to the side as if you are looking over your left / right shoulder. Keep your eyes level with the ground. Stop when you feel a stretch along the side and the back of your neck. Hold for __________ seconds. Repeat  this by turning to your other side. Repeat __________ times. Complete this exercise __________ times a day. Thoracic extension and pectoral stretch  Roll a towel or a small blanket so it is about 4 inches (10 cm) in diameter. Lie down on your back on a firm surface. Put the towel in the middle of your back across your spine. It should not be under your shoulder blades. Put your hands behind your head and let your elbows fall out to your sides. Hold for __________ seconds. Repeat __________ times. Complete this exercise __________ times a day. Strengthening exercises Isometric upper cervical flexion  Lie on your back with a thin pillow behind your head and a small rolled-up towel under your neck. Gently tuck your chin toward your chest and nod your head down to look toward your feet. Do not lift your head off the pillow. Hold for __________ seconds. Release the tension slowly. Relax your neck muscles completely before you repeat this exercise. Repeat __________ times. Complete this exercise __________ times a day. Isometric cervical extension  Stand about 6 inches (15 cm) away from a wall, with your back facing the wall. Place a soft object, about 6-8 inches (15-20 cm) in diameter, between the back of your head and the wall. A soft object could be a small pillow, a ball, or a folded towel. Gently tilt your head back and press into the soft object. Keep your jaw and forehead relaxed. Hold for __________ seconds. Release the tension slowly. Relax your neck muscles completely before you repeat this exercise. Repeat __________ times. Complete this exercise __________ times a day. Posture and body mechanics Body mechanics refers to the movements and positions of your body while you do your daily activities. Posture is part of body mechanics. Good posture and healthy body mechanics can help to relieve stress in your body's tissues and joints. Good posture means that your spine is in its natural  S-curve position (your spine is neutral), your shoulders are pulled back slightly, and your head is not tipped forward. The following are general guidelines for applying improved posture and body mechanics to your everyday activities. Sitting  When sitting, keep your spine neutral and keep your feet flat on the floor. Use a footrest, if necessary, and keep your thighs parallel to the floor. Avoid rounding your shoulders, and avoid tilting your head forward. When working at a desk or a computer, keep your desk at a height where your hands are slightly lower than your elbows. Slide your chair under your desk so you are close enough to maintain good posture. When working at a computer, place your monitor at a height where you are looking straight ahead and you do not have to tilt your head forward or downward to look at the screen. Standing  When standing, keep your spine neutral and keep your feet about hip-width apart. Keep a slight bend in your knees. Your ears, shoulders, and hips should line up. When you do a task in which you stand in one place for a long time, place one foot up on a stable object that is 2-4 inches (5-10 cm) high, such  as a footstool. This helps keep your spine neutral. Resting When lying down and resting, avoid positions that are most painful for you. Try to support your neck in a neutral position. You can use a contour pillow or a small rolled-up towel. Your pillow should support your neck but not push on it. This information is not intended to replace advice given to you by your health care provider. Make sure you discuss any questions you have with your health care provider. Document Revised: 09/24/2021 Document Reviewed: 09/24/2021 Elsevier Patient Education  [place]. Shoulder Exercises Ask your health care provider which exercises are safe for you. Do exercises exactly as told by your health care provider and adjust them as directed. It is normal to feel mild  stretching, pulling, tightness, or discomfort as you do these exercises. Stop right away if you feel sudden pain or your pain gets worse. Do not begin these exercises until told by your health care provider. Stretching exercises External rotation and abduction This exercise is sometimes called corner stretch. This exercise rotates your arm outward (external rotation) and moves your arm out from your body (abduction). Stand in a doorway with one of your feet slightly in front of the other. This is called a staggered stance. If you cannot reach your forearms to the door frame, stand facing a corner of a room. Choose one of the following positions as told by your health care provider: Place your hands and forearms on the door frame above your head. Place your hands and forearms on the door frame at the height of your head. Place your hands on the door frame at the height of your elbows. Slowly move your weight onto your front foot until you feel a stretch across your chest and in the front of your shoulders. Keep your head and chest upright and keep your abdominal muscles tight. Hold for __________ seconds. To release the stretch, shift your weight to your back foot. Repeat __________ times. Complete this exercise __________ times a day. Extension, standing Stand and hold a broomstick, a cane, or a similar object behind your back. Your hands should be a little wider than shoulder width apart. Your palms should face away from your back. Keeping your elbows straight and your shoulder muscles relaxed, move the stick away from your body until you feel a stretch in your shoulders (extension). Avoid shrugging your shoulders while you move the stick. Keep your shoulder blades tucked down toward the middle of your back. Hold for __________ seconds. Slowly return to the starting position. Repeat __________ times. Complete this exercise __________ times a day. Range-of-motion exercises Pendulum  Stand  near a wall or a surface that you can hold onto for balance. Bend at the waist and let your left / right arm hang straight down. Use your other arm to support you. Keep your back straight and do not lock your knees. Relax your left / right arm and shoulder muscles, and move your hips and your trunk so your left / right arm swings freely. Your arm should swing because of the motion of your body, not because you are using your arm or shoulder muscles. Keep moving your hips and trunk so your arm swings in the following directions, as told by your health care provider: Side to side. Forward and backward. In clockwise and counterclockwise circles. Continue each motion for __________ seconds, or for as long as told by your health care provider. Slowly return to the starting position. Repeat __________  times. Complete this exercise __________ times a day. Shoulder flexion, standing  Stand and hold a broomstick, a cane, or a similar object. Place your hands a little more than shoulder width apart on the object. Your left / right hand should be palm up, and your other hand should be palm down. Keep your elbow straight and your shoulder muscles relaxed. Push the stick up with your healthy arm to raise your left / right arm in front of your body, and then over your head until you feel a stretch in your shoulder (flexion). Avoid shrugging your shoulder while you raise your arm. Keep your shoulder blade tucked down toward the middle of your back. Hold for __________ seconds. Slowly return to the starting position. Repeat __________ times. Complete this exercise __________ times a day. Shoulder abduction, standing Stand and hold a broomstick, a cane, or a similar object. Place your hands a little more than shoulder width apart on the object. Your left / right hand should be palm up, and your other hand should be palm down. Keep your elbow straight and your shoulder muscles relaxed. Push the object across your  body toward your left / right side. Raise your left / right arm to the side of your body (abduction) until you feel a stretch in your shoulder. Do not raise your arm above shoulder height unless your health care provider tells you to do that. If directed, raise your arm over your head. Avoid shrugging your shoulder while you raise your arm. Keep your shoulder blade tucked down toward the middle of your back. Hold for __________ seconds. Slowly return to the starting position. Repeat __________ times. Complete this exercise __________ times a day. Internal rotation  Place your left / right hand behind your back, palm up. Use your other hand to dangle an exercise band, a towel, or a similar object over your shoulder. Grasp the band with your left / right hand so you are holding on to both ends. Gently pull up on the band until you feel a stretch in the front of your left / right shoulder. The movement of your arm toward the center of your body is called internal rotation. Avoid shrugging your shoulder while you raise your arm. Keep your shoulder blade tucked down toward the middle of your back. Hold for __________ seconds. Release the stretch by letting go of the band and lowering your hands. Repeat __________ times. Complete this exercise __________ times a day. Strengthening exercises External rotation  Sit in a stable chair without armrests. Secure an exercise band to a stable object at elbow height on your left / right side. Place a soft object, such as a folded towel or a small pillow, between your left / right upper arm and your body to move your elbow about 4 inches (10 cm) away from your side. Hold the end of the exercise band so it is tight and there is no slack. Keeping your elbow pressed against the soft object, slowly move your forearm out, away from your abdomen (external rotation). Keep your body steady so only your forearm moves. Hold for __________ seconds. Slowly return to the  starting position. Repeat __________ times. Complete this exercise __________ times a day. Shoulder abduction  Sit in a stable chair without armrests, or stand up. Hold a __________ weight in your left / right hand, or hold an exercise band with both hands. Start with your arms straight down and your left / right palm facing in, toward your  body. Slowly lift your left / right hand out to your side (abduction). Do not lift your hand above shoulder height unless your health care provider tells you that this is safe. Keep your arms straight. Avoid shrugging your shoulder while you do this movement. Keep your shoulder blade tucked down toward the middle of your back. Hold for __________ seconds. Slowly lower your arm, and return to the starting position. Repeat __________ times. Complete this exercise __________ times a day. Shoulder extension Sit in a stable chair without armrests, or stand up. Secure an exercise band to a stable object in front of you so it is at shoulder height. Hold one end of the exercise band in each hand. Your palms should face each other. Straighten your elbows and lift your hands up to shoulder height. Step back, away from the secured end of the exercise band, until the band is tight and there is no slack. Squeeze your shoulder blades together as you pull your hands down to the sides of your thighs (extension). Stop when your hands are straight down by your sides. Do not let your hands go behind your body. Hold for __________ seconds. Slowly return to the starting position. Repeat __________ times. Complete this exercise __________ times a day. Shoulder row Sit in a stable chair without armrests, or stand up. Secure an exercise band to a stable object in front of you so it is at waist height. Hold one end of the exercise band in each hand. Position your palms so that your thumbs are facing the ceiling (neutral position). Bend each of your elbows to a 90-degree angle  (right angle) and keep your upper arms at your sides. Step back until the band is tight and there is no slack. Slowly pull your elbows back behind you. Hold for __________ seconds. Slowly return to the starting position. Repeat __________ times. Complete this exercise __________ times a day. Shoulder press-ups  Sit in a stable chair that has armrests. Sit upright, with your feet flat on the floor. Put your hands on the armrests so your elbows are bent and your fingers are pointing forward. Your hands should be about even with the sides of your body. Push down on the armrests and use your arms to lift yourself off the chair. Straighten your elbows and lift yourself up as much as you comfortably can. Move your shoulder blades down, and avoid letting your shoulders move up toward your ears. Keep your feet on the ground. As you get stronger, your feet should support less of your body weight as you lift yourself up. Hold for __________ seconds. Slowly lower yourself back into the chair. Repeat __________ times. Complete this exercise __________ times a day. Wall push-ups  Stand so you are facing a stable wall. Your feet should be about one arm-length away from the wall. Lean forward and place your palms on the wall at shoulder height. Keep your feet flat on the floor as you bend your elbows and lean forward toward the wall. Hold for __________ seconds. Straighten your elbows to push yourself back to the starting position. Repeat __________ times. Complete this exercise __________ times a day. This information is not intended to replace advice given to you by your health care provider. Make sure you discuss any questions you have with your health care provider. Document Revised: 10/30/2021 Document Reviewed: 12/04/2018 Elsevier Patient Education  [place] Band Syndrome Rehab Ask your health care provider which exercises are safe for you. Do  exercises exactly as told by  your health care provider and adjust them as directed. It is normal to feel mild stretching, pulling, tightness, or discomfort as you do these exercises. Stop right away if you feel sudden pain or your pain gets significantly worse. Do not begin these exercises until told by your health care provider. Stretching and range-of-motion exercises These exercises warm up your muscles and joints and improve the movement and flexibility of your hip and pelvis. Quadriceps stretch, prone  Lie on your abdomen (prone position) on a firm surface, such as a bed or padded floor. Bend your left / right knee and reach back to hold your ankle or pant leg. If you cannot reach your ankle or pant leg, loop a belt around your foot and grab the belt instead. Gently pull your heel toward your buttocks. Your knee should not slide out to the side. You should feel a stretch in the front of your thigh and knee (quadriceps). Hold this position for __________ seconds. Repeat __________ times. Complete this exercise __________ times a day. Iliotibial band stretch An iliotibial band is a strong band of muscle tissue that runs from the outer side of your hip to the outer side of your thigh and knee. Lie on your side with your left / right leg in the top position. Bend both of your knees and grab your left / right ankle. Stretch out your bottom arm to help you balance. Slowly bring your top knee back so your thigh goes behind your trunk. Slowly lower your top leg toward the floor until you feel a gentle stretch on the outside of your left / right hip and thigh. If you do not feel a stretch and your knee will not fall farther, place the heel of your other foot on top of your knee and pull your knee down toward the floor with your foot. Hold this position for __________ seconds. Repeat __________ times. Complete this exercise __________ times a day. Strengthening exercises These exercises build strength and endurance in your hip and  pelvis. Endurance is the ability to use your muscles for a long time, even after they get tired. Straight leg raises, side-lying This exercise strengthens the muscles that rotate the leg at the hip and move it away from your body (hip abductors). Lie on your side with your left / right leg in the top position. Lie so your head, shoulder, hip, and knee line up. You may bend your bottom knee to help you balance. Roll your hips slightly forward so your hips are stacked directly over each other and your left / right knee is facing forward. Tense the muscles in your outer thigh and lift your top leg 4-6 inches (10-15 cm). Hold this position for __________ seconds. Slowly lower your leg to return to the starting position. Let your muscles relax completely before doing another repetition. Repeat __________ times. Complete this exercise __________ times a day. Leg raises, prone This exercise strengthens the muscles that move the hips backward (hip extensors). Lie on your abdomen (prone position) on your bed or a firm surface. You can put a pillow under your hips if that is more comfortable for your lower back. Bend your left / right knee so your foot is straight up in the air. Squeeze your buttocks muscles and lift your left / right thigh off the bed. Do not let your back arch. Tense your thigh muscle as hard as you can without increasing any knee pain. Hold this  position for __________ seconds. Slowly lower your leg to return to the starting position and allow it to relax completely. Repeat __________ times. Complete this exercise __________ times a day. Hip hike Stand sideways on a bottom step. Stand on your left / right leg with your other foot unsupported next to the step. You can hold on to a railing or wall for balance if needed. Keep your knees straight and your torso square. Then lift your left / right hip up toward the ceiling. Slowly let your left / right hip lower toward the floor, past the  starting position. Your foot should get closer to the floor. Do not lean or bend your knees. Repeat __________ times. Complete this exercise __________ times a day. This information is not intended to replace advice given to you by your health care provider. Make sure you discuss any questions you have with your health care provider. Document Revised: 01/12/2020 Document Reviewed: 01/12/2020 Elsevier Patient Education  [place].

## 2022-05-09 LAB — CBC WITH DIFFERENTIAL/PLATELET
Absolute Monocytes: 714 cells/uL (ref 200–950)
Basophils Absolute: 77 cells/uL (ref 0–200)
Basophils Relative: 1.3 %
Eosinophils Absolute: 425 cells/uL (ref 15–500)
Eosinophils Relative: 7.2 %
HCT: 42.4 % (ref 35.0–45.0)
Hemoglobin: 13.8 g/dL (ref 11.7–15.5)
Lymphs Abs: 1428 cells/uL (ref 850–3900)
MCH: 31.8 pg (ref 27.0–33.0)
MCHC: 32.5 g/dL (ref 32.0–36.0)
MCV: 97.7 fL (ref 80.0–100.0)
MPV: 9.3 fL (ref 7.5–12.5)
Monocytes Relative: 12.1 %
Neutro Abs: 3257 cells/uL (ref 1500–7800)
Neutrophils Relative %: 55.2 %
Platelets: 304 10*3/uL (ref 140–400)
RBC: 4.34 10*6/uL (ref 3.80–5.10)
RDW: 12.6 % (ref 11.0–15.0)
Total Lymphocyte: 24.2 %
WBC: 5.9 10*3/uL (ref 3.8–10.8)

## 2022-05-09 LAB — COMPLETE METABOLIC PANEL WITH GFR
AG Ratio: 1.5 (calc) (ref 1.0–2.5)
ALT: 26 U/L (ref 6–29)
AST: 35 U/L (ref 10–35)
Albumin: 3.8 g/dL (ref 3.6–5.1)
Alkaline phosphatase (APISO): 68 U/L (ref 37–153)
BUN: 17 mg/dL (ref 7–25)
CO2: 31 mmol/L (ref 20–32)
Calcium: 9.2 mg/dL (ref 8.6–10.4)
Chloride: 100 mmol/L (ref 98–110)
Creat: 0.64 mg/dL (ref 0.60–1.00)
Globulin: 2.6 g/dL (calc) (ref 1.9–3.7)
Glucose, Bld: 68 mg/dL (ref 65–99)
Potassium: 4.1 mmol/L (ref 3.5–5.3)
Sodium: 138 mmol/L (ref 135–146)
Total Bilirubin: 0.5 mg/dL (ref 0.2–1.2)
Total Protein: 6.4 g/dL (ref 6.1–8.1)
eGFR: 90 mL/min/{1.73_m2} (ref >=60)

## 2022-05-09 NOTE — Progress Notes (Signed)
CBC and CMP are normal.

## 2022-05-14 ENCOUNTER — Ambulatory Visit: Payer: Medicare Other | Admitting: Interventional Cardiology

## 2022-05-14 ENCOUNTER — Encounter: Payer: Self-pay | Admitting: Interventional Cardiology

## 2022-05-14 VITALS — BP 128/60 | HR 77 | Ht 62.0 in | Wt 125.0 lb

## 2022-05-14 DIAGNOSIS — Z950 Presence of cardiac pacemaker: Secondary | ICD-10-CM | POA: Diagnosis not present

## 2022-05-14 DIAGNOSIS — I2510 Atherosclerotic heart disease of native coronary artery without angina pectoris: Secondary | ICD-10-CM | POA: Diagnosis not present

## 2022-05-14 DIAGNOSIS — Z9861 Coronary angioplasty status: Secondary | ICD-10-CM

## 2022-05-14 DIAGNOSIS — I442 Atrioventricular block, complete: Secondary | ICD-10-CM | POA: Diagnosis not present

## 2022-05-14 DIAGNOSIS — I447 Left bundle-branch block, unspecified: Secondary | ICD-10-CM

## 2022-05-14 DIAGNOSIS — I5020 Unspecified systolic (congestive) heart failure: Secondary | ICD-10-CM

## 2022-05-23 ENCOUNTER — Ambulatory Visit (HOSPITAL_COMMUNITY)
Admission: RE | Admit: 2022-05-23 | Discharge: 2022-05-23 | Disposition: A | Discharge status: Home / Self Care | Payer: Medicare Other | Source: Ambulatory Visit | Attending: Internal Medicine | Admitting: Internal Medicine

## 2022-05-23 DIAGNOSIS — J479 Bronchiectasis, uncomplicated: Secondary | ICD-10-CM | POA: Diagnosis not present

## 2022-05-23 DIAGNOSIS — J849 Interstitial pulmonary disease, unspecified: Secondary | ICD-10-CM | POA: Insufficient documentation

## 2022-05-23 DIAGNOSIS — J9690 Respiratory failure, unspecified, unspecified whether with hypoxia or hypercapnia: Secondary | ICD-10-CM | POA: Diagnosis not present

## 2022-05-24 ENCOUNTER — Telehealth: Payer: Self-pay | Admitting: Internal Medicine

## 2022-05-24 ENCOUNTER — Encounter: Payer: Self-pay | Admitting: Internal Medicine

## 2022-05-24 NOTE — Telephone Encounter (Signed)
Called patient to review the CT scan results. 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 She is on DMARDS with plaquenil and MTX( which will address pulmonary involvement.) PFTs confirm mild disease with no restriction to ventilation. Would continue current course and continue with follow up in September as scheduled.

## 2022-05-24 NOTE — Telephone Encounter (Signed)
Do you want me to switch the patient from methotrexate to Imuran or CellCept?

## 2022-05-24 NOTE — Telephone Encounter (Signed)
I think if her extra-pulmonary RA symptoms are stable on current regimen would continue for now. I would expect MTX to address some pulmonary involvement. I will repeat her lung function in six months and monitor symptoms. If there is a drop or change we can re-evaluate.

## 2022-05-24 NOTE — Telephone Encounter (Signed)
See phone note from 05/24/22. Dr. Shearon Stalls has been in communication with Dr. Estanislado Pandy regarding the CT chest.

## 2022-06-05 ENCOUNTER — Other Ambulatory Visit: Payer: Self-pay

## 2022-06-05 MED ORDER — ATORVASTATIN CALCIUM 40 MG PO TABS: ORAL_TABLET | ORAL | 3 refills | Status: DC

## 2022-07-01 ENCOUNTER — Ambulatory Visit: Payer: Medicare Other

## 2022-07-02 LAB — CUP PACEART REMOTE DEVICE CHECK
Battery Remaining Longevity: 43 mo
Battery Voltage: 2.97 V
Brady Statistic AP VP Percent: 0.25 %
Brady Statistic AP VS Percent: 0.03 %
Brady Statistic AS VP Percent: 95.61 %
Brady Statistic AS VS Percent: 4.11 %
Brady Statistic RA Percent Paced: 0.28 %
Brady Statistic RV Percent Paced: 94.67 %
Date Time Interrogation Session: 20230813022916
Implantable Lead Implant Date: 20170818
Implantable Lead Implant Date: 20170818
Implantable Lead Implant Date: 20170818
Implantable Lead Location: 753858
Implantable Lead Location: 753859
Implantable Lead Location: 753860
Implantable Lead Model: 4396
Implantable Lead Model: 5076
Implantable Lead Model: 5076
Implantable Pulse Generator Implant Date: 20170818
Lead Channel Impedance Value: 323 Ohm
Lead Channel Impedance Value: 418 Ohm
Lead Channel Impedance Value: 418 Ohm
Lead Channel Impedance Value: 456 Ohm
Lead Channel Impedance Value: 475 Ohm
Lead Channel Impedance Value: 513 Ohm
Lead Channel Impedance Value: 570 Ohm
Lead Channel Impedance Value: 608 Ohm
Lead Channel Impedance Value: 855 Ohm
Lead Channel Pacing Threshold Amplitude: 0.375 V
Lead Channel Pacing Threshold Amplitude: 0.625 V
Lead Channel Pacing Threshold Amplitude: 0.625 V
Lead Channel Pacing Threshold Pulse Width: 0.4 ms
Lead Channel Pacing Threshold Pulse Width: 0.4 ms
Lead Channel Pacing Threshold Pulse Width: 0.4 ms
Lead Channel Sensing Intrinsic Amplitude: 1.25 mV
Lead Channel Sensing Intrinsic Amplitude: 1.25 mV
Lead Channel Sensing Intrinsic Amplitude: 6.125 mV
Lead Channel Sensing Intrinsic Amplitude: 6.125 mV
Lead Channel Setting Pacing Amplitude: 1.5 V
Lead Channel Setting Pacing Amplitude: 1.75 V
Lead Channel Setting Pacing Amplitude: 2 V
Lead Channel Setting Pacing Pulse Width: 0.4 ms
Lead Channel Setting Pacing Pulse Width: 0.4 ms
Lead Channel Setting Sensing Sensitivity: 0.9 mV

## 2022-07-08 DIAGNOSIS — L570 Actinic keratosis: Secondary | ICD-10-CM | POA: Diagnosis not present

## 2022-07-08 DIAGNOSIS — Z85828 Personal history of other malignant neoplasm of skin: Secondary | ICD-10-CM | POA: Diagnosis not present

## 2022-07-08 DIAGNOSIS — L821 Other seborrheic keratosis: Secondary | ICD-10-CM | POA: Diagnosis not present

## 2022-07-08 DIAGNOSIS — Z08 Encounter for follow-up examination after completed treatment for malignant neoplasm: Secondary | ICD-10-CM | POA: Diagnosis not present

## 2022-07-08 DIAGNOSIS — L814 Other melanin hyperpigmentation: Secondary | ICD-10-CM | POA: Diagnosis not present

## 2022-07-08 DIAGNOSIS — D485 Neoplasm of uncertain behavior of skin: Secondary | ICD-10-CM | POA: Diagnosis not present

## 2022-07-08 DIAGNOSIS — L538 Other specified erythematous conditions: Secondary | ICD-10-CM | POA: Diagnosis not present

## 2022-07-08 DIAGNOSIS — L578 Other skin changes due to chronic exposure to nonionizing radiation: Secondary | ICD-10-CM | POA: Diagnosis not present

## 2022-07-08 DIAGNOSIS — D225 Melanocytic nevi of trunk: Secondary | ICD-10-CM | POA: Diagnosis not present

## 2022-07-08 DIAGNOSIS — L720 Epidermal cyst: Secondary | ICD-10-CM | POA: Diagnosis not present

## 2022-07-17 ENCOUNTER — Other Ambulatory Visit: Payer: Self-pay | Admitting: Physician Assistant

## 2022-07-17 NOTE — Telephone Encounter (Signed)
Next Visit: 08/07/2022  Last Visit: 05/08/2022  Last Fill: 04/17/2022  DX: Rheumatoid arthritis involving multiple sites with positive rheumatoid factor  Current Dose per office note 05/08/2022:  Methotrexate 4 tablets p.o. weekly  Labs: 05/08/2022 CBC and CMP are normal.  Okay to refill MTX?

## 2022-07-19 ENCOUNTER — Telehealth: Payer: Self-pay | Admitting: Rheumatology

## 2022-07-19 NOTE — Telephone Encounter (Signed)
Spoke with patient and advised her prescription was sent on 07/17/2022. Patient states she just spoke with the pharmacy and they are processing prescription refill.

## 2022-07-19 NOTE — Telephone Encounter (Signed)
Patient left a voicemail requesting prescription refill of Methotrexate.  Patient states she is out of medication.

## 2022-07-24 NOTE — Progress Notes (Signed)
Office Visit Note  Patient: [name]             Date of Birth: 1946-06-09           MRN: [id number]             PCP: Glenis Smoker, MD Referring: Glenis Smoker, * Visit Date: 08/07/2022 Occupation: '@GUAROCC'$ @  Subjective:  Discuss new diagnosis of ILD   History of Present Illness: [name] is a 76 y.o. female with history of seropositive rheumatoid arthritis and osteoarthritis.  Patient is taking methotrexate 4 tablets p.o. weekly, folic acid 2 mg p.o. daily, hydroxychloroquine 200 mg daily Monday to Friday.  She is tolerating methotrexate and Plaquenil without any side effects.  Patient received a new diagnosis of ILD apparent on high-resolution chest CT on 05/23/2022.  She has been evaluated by Dr. Shearon Stalls who recommended repeating PFTs in 6 months and made no medication changes at this time. Patient continues to experience pain and stiffness in her right hand.  At times it is harder for her to make a complete fist.  She has not been experiencing any triggering or locking in her fingers.  She also has ongoing discomfort in her lower back. She has had the annual flu shot.  She is planning on getting the COVID booster and RSV vaccine.    Activities of Daily Living:  Patient reports morning stiffness for 3-4 minutes.   Patient Denies nocturnal pain.  Difficulty dressing/grooming: Denies Difficulty climbing stairs: Denies Difficulty getting out of chair: Denies Difficulty using hands for taps, buttons, cutlery, and/or writing: Reports  Review of Systems  Constitutional:  Positive for fatigue.  HENT:  Positive for mouth dryness. Negative for mouth sores.   Eyes:  Positive for dryness.  Respiratory:  Positive for shortness of breath.   Cardiovascular:  Negative for chest pain and palpitations.  Gastrointestinal:  Negative for blood in stool, constipation and diarrhea.  Endocrine: Negative for increased urination.  Genitourinary:  Negative for involuntary  urination.  Musculoskeletal:  Positive for joint pain, joint pain, myalgias, muscle weakness, morning stiffness, muscle tenderness and myalgias. Negative for gait problem and joint swelling.  Skin:  Negative for color change, rash, hair loss and sensitivity to sunlight.  Allergic/Immunologic: Negative for susceptible to infections.  Neurological:  Positive for headaches. Negative for dizziness.  Hematological:  Negative for swollen glands.  Psychiatric/Behavioral:  Positive for sleep disturbance. Negative for depressed mood. The patient is nervous/anxious.     PMFS History:  Patient Active Problem List   Diagnosis Date Noted   Acute respiratory failure with hypoxia ([place]) 02/24/2022   GERD (gastroesophageal reflux disease) 02/24/2022   Elevated troponin 02/24/2022   Bilateral carpal tunnel syndrome 04/14/2018   Cervical spinal stenosis 01/07/2018   Cervical radiculopathy 01/07/2018   Stenosis of left subclavian artery ([place]) 12/15/2017   History of gastroesophageal reflux (GERD) 06/25/2017   History of bilateral carpal tunnel release 06/25/2017   Former smoker 01/25/2017   Rheumatoid arthritis involving multiple sites with positive rheumatoid factor ([place]) 01/17/2017   High risk medication use 01/17/2017   Primary osteoarthritis of both hands 01/17/2017   Primary osteoarthritis of both feet 01/17/2017   DJD (degenerative joint disease), cervical 01/17/2017   Spondylosis of lumbar region without myelopathy or radiculopathy 01/17/2017   Cardiomyopathy, ischemic 07/07/2016   CAD- S/P LAD thrombectomy 07/04/16 07/07/2016   Pacemaker-MDT BiV placed 07/05/16 07/07/2016   History of rheumatoid arthritis 07/07/2016   Complete heart block ([place]) 07/04/2016   Essential  hypertension 07/04/2016   LBBB (left bundle branch block) 07/04/2016   Exostosis 06/18/2011   History of breast cancer 02/16/2010   Vitamin D deficiency 02/15/2010   HYPERCHOLESTEROLEMIA 02/15/2010   Anxiety state 02/15/2010    Asthma exacerbation 02/15/2010   MIGRAINES, HX OF 02/15/2010   MASTECTOMY, LEFT, HX OF 02/15/2010    Past Medical History:  Diagnosis Date   Anxiety    Asthma    Breast cancer ([place])    left breast   Carpal tunnel syndrome    Depression    Habitual alcohol use    Heart attack ([place])    Hypertension    Interstitial lung disease (H[facility])    Personal history of chemotherapy    Personal history of radiation therapy    RA (rheumatoid arthritis) ([place])    Spinal stenosis    Vision abnormalities     Family History  Problem Relation Age of Onset   Stroke Mother    Cancer Father        colon?   Asthma Father    Pyelonephritis Sister    Pulmonary fibrosis Sister    Asthma Paternal Grandfather    Cancer Paternal Aunt    Cancer Paternal Uncle    Past Surgical History:  Procedure Laterality Date   BREAST BIOPSY     BREAST LUMPECTOMY  2005   left   CARDIAC CATHETERIZATION N/A 07/04/2016   Procedure: Left Heart Cath and Coronary Angiography;  Surgeon: Belva Crome, MD;  Location: [facility];  Service: Cardiovascular;  Laterality: N/A;   CARDIAC CATHETERIZATION N/A 07/04/2016   Procedure: Temporary Pacemaker;  Surgeon: Belva Crome, MD;  Location: [facility];  Service: Cardiovascular;  Laterality: N/A;   CARPAL TUNNEL RELEASE     CATARACT EXTRACTION Bilateral    EP IMPLANTABLE DEVICE N/A 07/05/2016   Procedure: Pacemaker Implant;  Surgeon: Evans Lance, MD;  Location: [facility];  Service: Cardiovascular;  Laterality: N/A;   herniated rectum repair   11/2020   LAMINECTOMY  1966   LYMPHECTOMY  2005   Social History   Social History Narrative   Not on file   Immunization History  Administered Date(s) Administered   Fluad Quad(high Dose 65+) 08/16/2019, 08/05/2022   Influenza Split 09/10/2007, 09/05/2009, 08/05/2011, 09/15/2012, 08/12/2013   Influenza Whole 08/21/2009, 08/29/2010   Influenza, High Dose Seasonal PF 08/30/2021   Influenza,inj,Quad PF,6+ Mos  08/21/2016, 09/24/2017, 07/27/2018, 08/21/2020   Influenza,inj,quad, With Preservative 08/01/2014, 08/16/2015   PFIZER(Purple Top)SARS-COV-2 Vaccination 01/14/2020, 02/08/2020, 08/08/2020, 04/09/2021   Pneumococcal Conjugate-13 08/01/2014   Pneumococcal Polysaccharide-23 05/08/2009, 04/23/2022   Td 01/26/2018   Tdap 05/06/2007   Zoster, Live 06/16/2009     Objective: Vital Signs: BP 135/65 (BP Location: Right Arm, Patient Position: Sitting, Thomas Size: Normal)   Pulse 79   Resp 14   Ht '5\' 2"'$  (1.575 m)   Wt 126 lb 6.4 oz (57.3 kg)   BMI 23.12 kg/m    Physical Exam Vitals and nursing note reviewed.  Constitutional:      Appearance: She is well-developed.  HENT:     Head: Normocephalic and atraumatic.  Eyes:     Conjunctiva/sclera: Conjunctivae normal.  Cardiovascular:     Rate and Rhythm: Normal rate and regular rhythm.     Heart sounds: Normal heart sounds.  Pulmonary:     Effort: Pulmonary effort is normal.     Breath sounds: Normal breath sounds.  Abdominal:     General: Bowel sounds are  normal.     Palpations: Abdomen is soft.  Musculoskeletal:     Cervical back: Normal range of motion.  Skin:    General: Skin is warm and dry.     Capillary Refill: Capillary refill takes less than 2 seconds.  Neurological:     Mental Status: She is alert and oriented to person, place, and time.  Psychiatric:        Behavior: Behavior normal.      Musculoskeletal Exam: C-spine has limited range of motion with lateral rotation.  Some discomfort with lumbar range of motion and change in position.  Left shoulder abduction to about 120 degrees.  Right shoulder has full range of motion with no discomfort.  Right elbow joint contracture unchanged.  PIP and DIP thickening consistent with osteoarthritis of both hands.  No tenderness or synovitis over MCP joints.  Hip joints have good range of motion with no groin pain.  Knee joints have good range of motion with no warmth or effusion.  Ankle  joints have good range of motion with no tenderness or joint swelling.  CDAI Exam: CDAI Score: 0.8  Patient Global: 4 mm; Provider Global: 4 mm Swollen: 0 ; Tender: 0  Joint Exam 08/07/2022   No joint exam has been documented for this visit   There is currently no information documented on the homunculus. Go to the Rheumatology activity and complete the homunculus joint exam.  Investigation: No additional findings.  Imaging: No results found.  Recent Labs: Lab Results  Component Value Date   WBC 5.9 05/08/2022   HGB 13.8 05/08/2022   PLT 304 05/08/2022   NA 138 05/08/2022   K 4.1 05/08/2022   CL 100 05/08/2022   CO2 31 05/08/2022   GLUCOSE 68 05/08/2022   BUN 17 05/08/2022   CREATININE 0.64 05/08/2022   BILITOT 0.5 05/08/2022   ALKPHOS 53 02/23/2022   AST 35 05/08/2022   ALT 26 05/08/2022   PROT 6.4 05/08/2022   ALBUMIN 3.3 (L) 02/23/2022   CALCIUM 9.2 05/08/2022   GFRAA 108 12/26/2020    Speciality Comments: PLQ Eye Exam: 04/12/2022 WNL @ [facility] f/u 12  months.    Procedures:  No procedures performed Allergies: Betadine [povidone iodine] and Hydrocodone   Assessment / Plan:     Visit Diagnoses: Rheumatoid arthritis involving multiple sites with positive rheumatoid factor ([place]) -She has no synovitis on examination today.  She has not had any recent rheumatoid arthritis flares.  She is clinically doing well taking methotrexate 4 tablets by mouth once weekly and Plaquenil 200 mg 1 tablet daily Monday through Friday.  She is tolerating combination therapy without any side effects or missed doses.  She had a high-resolution chest CT on 05/23/2022 which revealed mild ILD.  Discussed the association between rheumatoid arthritis and interstitial lung disease.  All questions were addressed today.  She is currently under the care of Dr. Shearon Stalls who recommends serial pulmonary function testing to monitor for disease progression.  No medication changes were  recommended at this time. Hydroxychloroquine level will be rechecked today since she reduced the dose of Plaquenil previously.  She will remain on the current dose of methotrexate and folic acid as prescribed.  She was advised to notify us if she develops signs or symptoms of a flare.  She will follow-up in the office in 3 months or sooner if needed.   Plan: hydroxychloroquine (PLAQUENIL) 200 MG tablet  High risk medication use - Methotrexate 4 tablets by mouth once  weekly, folic acid 2 mg by mouth daily, hydroxychloroquine 200 mg  daily Monday to Friday.   - Plan: CBC with Differential/Platelet, COMPLETE METABOLIC PANEL WITH GFR, Hydroxychloroquine, Blood CBC and CMP were drawn on 05/08/2022.  Orders for CBC and CMP were released today.  Her next lab work will be due in December and every 3 months to monitor for drug toxicity. Hydroxychloroquine blood level will be rechecked today. She has not had any recent or recurrent infections.  Discussed the importance of holding methotrexate if she develops signs or symptoms of an infection and to resume once the infection is completely cleared. She received the annual flu shot.  She is planning on getting the COVID booster and RSV vaccine. PLQ Eye Exam: 04/12/2022 WNL @ [facility] f/u 12 months.  ILD (interstitial lung disease) ([place]): Probable early UIP related to underlying RA- High-resolution chest CT 05/23/2022: Mild to moderate patchy confluent subpleural reticulation and ground glass opacity throughout both lungs with associated mild traction bronchiectasis.  No frank honeycombing.  Findings are new since 10/15/2004 and are unchanged since 02/24/2022 chest CT angiogram.  Findings are suggestive of ILD. Evaluated by Dr. Shearon Stalls on 08/05/2022-office visit note was reviewed today based on CT and lung function Dr. Shearon Stalls feels that her ILD is currently mild.  Recommends monitoring for disease progression with serial pulmonary function testing.  No  medication changes were recommended at this time.  Arthritis of right acromioclavicular joint: She has good range of motion of the right shoulder joint on examination today.  Contracture of right elbow: Unchanged.  No tenderness or inflammation noted.  Primary osteoarthritis of both hands: She has PIP and DIP thickening consistent with osteoarthritis of both hands.  Trigger finger, right middle finger - Injected on 01/05/21 and November 29, 2021. Resolved.   Primary osteoarthritis of right knee: She has good ROM of the right knee joint with no discomfort.  No warmth or effusion noted.   Trochanteric bursitis, left hip: Improved.  Cortisone injection performed on 05/08/2022 has alleviated her symptoms.  Primary osteoarthritis of both feet: Bunions noted bilaterally.  Overcrowding of toes.  Discussed the importance of wearing proper fitting shoes.   DDD (degenerative disc disease), cervical: She experiences intermittent pain and stiffness in her C-spine.  No symptoms of radiculopathy at this time.  DDD (degenerative disc disease), lumbar: She has occasional discomfort in her lower back but overall her symptoms have been manageable.  Osteoporosis screening - DEXA 02/21/20: DualFemur Neck Left 02/21/2020 76.0 -0.8 0.929 g/cm2. She is taking a calcium and vitamin D supplement daily.   History of vitamin D deficiency: She is taking a calcium and vitamin D supplement daily.   Other medical conditions are listed as follows:  History of gastroesophageal reflux (GERD)  History of anxiety  History of hyperlipidemia  History of breast cancer  Former smoker  History of hypertension: Blood pressure 135/65 today in the office.  History of asthma  History of left bundle branch block  History of ventricular tachycardia  Orders: Orders Placed This Encounter  Procedures   CBC with Differential/Platelet   COMPLETE METABOLIC PANEL WITH GFR   Hydroxychloroquine, Blood   Meds ordered this  encounter  Medications   hydroxychloroquine (PLAQUENIL) 200 MG tablet    Sig: Take 1 tablet by mouth, Monday through Friday only.    Dispense:  60 tablet    Refill:  0     Follow-Up Instructions: Return in about 3 months (around 11/06/2022) for Rheumatoid arthritis, Osteoarthritis.  Ofilia Neas, PA-C  Note - This record has been created using Dragon software.  Chart creation errors have been sought, but may not always  have been located. Such creation errors do not reflect on  the standard of medical care.

## 2022-08-05 ENCOUNTER — Encounter: Payer: Self-pay | Admitting: Internal Medicine

## 2022-08-05 ENCOUNTER — Ambulatory Visit: Payer: Medicare Other | Admitting: Internal Medicine

## 2022-08-05 VITALS — BP 128/74 | HR 84 | Temp 98.4°F | Ht 62.0 in | Wt 127.4 lb

## 2022-08-05 DIAGNOSIS — Z23 Encounter for immunization: Secondary | ICD-10-CM | POA: Diagnosis not present

## 2022-08-05 DIAGNOSIS — J84112 Idiopathic pulmonary fibrosis: Secondary | ICD-10-CM | POA: Diagnosis not present

## 2022-08-05 DIAGNOSIS — J849 Interstitial pulmonary disease, unspecified: Secondary | ICD-10-CM

## 2022-08-05 DIAGNOSIS — M0579 Rheumatoid arthritis with rheumatoid factor of multiple sites without organ or systems involvement: Secondary | ICD-10-CM

## 2022-08-05 NOTE — Addendum Note (Signed)
Addended by: Gavin Potters R on: 08/05/2022 01:57 PM   Modules accepted: Orders

## 2022-08-05 NOTE — Progress Notes (Signed)
[name]    [id number]    03-Sep-1946  Primary Care Physician:Timberlake, Anastasia Pall, MD Date of Appointment: 08/05/2022 Established Patient Visit  Chief complaint:   Chief Complaint  Patient presents with   Follow-up    SB improved      HPI: [name] is a 76 y.o. woman with history of tobacco use disorder, rheumatoid arthritis and shortness of breath.   Interval Updates: Here for follow up after HRCT chest. Still taking advair inhaler 2 puffs twice a day. Wheezing improved with albuterol.   She is having worsening fatigue - all the time including when she wakes up in the morning. This feels different from the shortness of breath symptoms.   RA is stable - no worsening arthralgias, rashes, fevers, chills.  Still on methotrexate and plaquenil.   No interval hospitalizations   I have reviewed the patient's family social and past medical history and updated as appropriate.   Past Medical History:  Diagnosis Date   Anxiety    Asthma    Breast cancer ([place])    left breast   Carpal tunnel syndrome    Depression    Habitual alcohol use    Heart attack ([place])    Hypertension    Personal history of chemotherapy    Personal history of radiation therapy    RA (rheumatoid arthritis) ([place])    Spinal stenosis    Vision abnormalities     Past Surgical History:  Procedure Laterality Date   BREAST BIOPSY     BREAST LUMPECTOMY  2005   left   CARDIAC CATHETERIZATION N/A 07/04/2016   Procedure: Left Heart Cath and Coronary Angiography;  Surgeon: Belva Crome, MD;  Location: [facility];  Service: Cardiovascular;  Laterality: N/A;   CARDIAC CATHETERIZATION N/A 07/04/2016   Procedure: Temporary Pacemaker;  Surgeon: Belva Crome, MD;  Location: [facility];  Service: Cardiovascular;  Laterality: N/A;   CARPAL TUNNEL RELEASE     CATARACT EXTRACTION Bilateral    EP IMPLANTABLE DEVICE N/A 07/05/2016   Procedure: Pacemaker Implant;  Surgeon:  Evans Lance, MD;  Location: [facility];  Service: Cardiovascular;  Laterality: N/A;   herniated rectum repair   11/2020   LAMINECTOMY  1966   LYMPHECTOMY  2005    Family History  Problem Relation Age of Onset   Stroke Mother    Cancer Father        colon?   Asthma Father    Pyelonephritis Sister    Pulmonary fibrosis Sister    Asthma Paternal Grandfather    Cancer Paternal Aunt    Cancer Paternal Uncle     Social History   Occupational History   Not on file  Tobacco Use   Smoking status: Former    Packs/day: 2.00    Years: 25.00    Total pack years: 50.00    Types: Cigarettes    Quit date: 1995    Years since quitting: 28.7    Passive exposure: Past   Smokeless tobacco: Never  Vaping Use   Vaping Use: Never used  Substance and Sexual Activity   Alcohol use: Yes    Alcohol/week: 14.0 standard drinks of alcohol    Types: 14 Cans of beer per week    Comment: 2 DAILY   Drug use: Never   Sexual activity: Not on file     Physical Exam: Blood pressure 128/74, pulse 84, temperature 98.4 F (  36.9 C), temperature source Oral, height '5\' 2"'$  (1.575 m), weight 127 lb 6.4 oz (57.8 kg), SpO2 95 %.  Gen:      NAD Lungs:    ctab no wheezes or crackles CV:         RRR Ext: no active synovitis   Data Reviewed: Imaging: I have personally reviewed the CT scan Hi Res from July 2023 which shows peripheral reticular markings with traction bronchiectasis in an apical basal gradient. There is no ground glass. These findings are consistent with UIP.   PFTs:     Latest Ref Rng & Units 05/01/2022   12:05 PM  PFT Results  FVC-Pre L 1.88   FVC-Predicted Pre % 76   FVC-Post L 1.93   FVC-Predicted Post % 78   Pre FEV1/FVC % % 77   Post FEV1/FCV % % 77   FEV1-Pre L 1.45   FEV1-Predicted Pre % 79   FEV1-Post L 1.50   DLCO uncorrected ml/min/mmHg 10.33   DLCO UNC% % 58   DLCO corrected ml/min/mmHg 10.33   DLCO COR %Predicted % 58   DLVA Predicted % 75   TLC L 4.21    TLC % Predicted % 88   RV % Predicted % 98    I have personally reviewed the patient's PFTs and normal spirometry and lung volumes, mildly reduced diffusion capacity.   Labs: Lab Results  Component Value Date   WBC 5.9 05/08/2022   HGB 13.8 05/08/2022   HCT 42.4 05/08/2022   MCV 97.7 05/08/2022   PLT 304 05/08/2022   Lab Results  Component Value Date   NA 138 05/08/2022   K 4.1 05/08/2022   CL 100 05/08/2022   CO2 31 05/08/2022    Immunization status: Immunization History  Administered Date(s) Administered   Fluad Quad(high Dose 65+) 08/16/2019, 08/05/2022   Influenza Split 09/10/2007, 09/05/2009, 08/05/2011, 09/15/2012, 08/12/2013   Influenza Whole 08/21/2009, 08/29/2010   Influenza, High Dose Seasonal PF 08/30/2021   Influenza,inj,Quad PF,6+ Mos 08/21/2016, 09/24/2017, 07/27/2018, 08/21/2020   Influenza,inj,quad, With Preservative 08/01/2014, 08/16/2015   PFIZER(Purple Top)SARS-COV-2 Vaccination 01/14/2020, 02/08/2020, 08/08/2020, 04/09/2021   Pneumococcal Conjugate-13 08/01/2014   Pneumococcal Polysaccharide-23 05/08/2009, 04/23/2022   Td 01/26/2018   Tdap 05/06/2007   Zoster, Live 06/16/2009    External Records Personally Reviewed: ED, PCP  Assessment:  Shortness of breath, Fatigue ILD - probable early UIP related to underlying Rheumatoid Arthritis History of tobacco use disorder Left hemidiaphragm eventration  Plan/Recommendations:  Before your next visit I would like you to have: Spirometry/DLCO - 30 minutes  You have scarring in your lungs called interstitial lung disease. The most likely reason for this is related to your rheumatoid arthritis. Based on CT scan and lung function, it is currently mild. We will need to monitor for disease progression with serial breathing testing and get new imaging if there is a change.   Continue your advair 2 puffs twice a day with albuterol as needed - I think this is treating some mild smoking related lung disease.    Look into online support groups for patients with autoimmune disorder for helping your symptoms such as fatigue. Talk with Dr. Lindell Noe to see what medications you might be able to come off of.   Keep up activity as best as you can. Movement is medicine.   Based on age and risk factors I recommend RSV vaccine, flu shot and covid booster for you this fall   Return to Care: Return in about 6 months (around  02/03/2023).   Lenice Llamas, MD Pulmonary and [place]

## 2022-08-05 NOTE — Patient Instructions (Addendum)
Please schedule follow up scheduled with myself in 6 months.  If my schedule is not open yet, we will contact you with a reminder closer to that time. Please call [phone] if you haven't heard from [place] a month before.   Before your next visit I would like you to have: Spirometry/DLCO - 30 minutes  You have scarring in your lungs called interstitial lung disease. The most likely reason for this is related to your rheumatoid arthritis. Based on CT scan and lung function, it is currently mild. We will need to monitor for disease progression with serial breathing testing and get new imaging if there is a change.   Continue your advair 2 puffs twice a day with albuterol as needed - I think this is treating some mild smoking related lung disease.   Look into online support groups for patients with autoimmune disorder for helping your symptoms such as fatigue. Talk with Dr. Lindell Noe to see what medications you might be able to come off of.   Keep up activity as best as you can. Movement is medicine.   Based on age and risk factors I recommend RSV vaccine, flu shot and covid booster for you this fall.

## 2022-08-07 ENCOUNTER — Ambulatory Visit: Payer: Medicare Other | Attending: Physician Assistant | Admitting: Physician Assistant

## 2022-08-07 ENCOUNTER — Encounter: Payer: Self-pay | Admitting: Physician Assistant

## 2022-08-07 VITALS — BP 135/65 | HR 79 | Resp 14 | Ht 62.0 in | Wt 126.4 lb

## 2022-08-07 DIAGNOSIS — M65331 Trigger finger, right middle finger: Secondary | ICD-10-CM

## 2022-08-07 DIAGNOSIS — R0602 Shortness of breath: Secondary | ICD-10-CM

## 2022-08-07 DIAGNOSIS — Z8709 Personal history of other diseases of the respiratory system: Secondary | ICD-10-CM

## 2022-08-07 DIAGNOSIS — M5030 Other cervical disc degeneration, unspecified cervical region: Secondary | ICD-10-CM

## 2022-08-07 DIAGNOSIS — M1711 Unilateral primary osteoarthritis, right knee: Secondary | ICD-10-CM

## 2022-08-07 DIAGNOSIS — M19072 Primary osteoarthritis, left ankle and foot: Secondary | ICD-10-CM

## 2022-08-07 DIAGNOSIS — M19011 Primary osteoarthritis, right shoulder: Secondary | ICD-10-CM

## 2022-08-07 DIAGNOSIS — M24521 Contracture, right elbow: Secondary | ICD-10-CM

## 2022-08-07 DIAGNOSIS — M5136 Other intervertebral disc degeneration, lumbar region: Secondary | ICD-10-CM | POA: Diagnosis not present

## 2022-08-07 DIAGNOSIS — M19042 Primary osteoarthritis, left hand: Secondary | ICD-10-CM

## 2022-08-07 DIAGNOSIS — J849 Interstitial pulmonary disease, unspecified: Secondary | ICD-10-CM

## 2022-08-07 DIAGNOSIS — Z853 Personal history of malignant neoplasm of breast: Secondary | ICD-10-CM

## 2022-08-07 DIAGNOSIS — Z13820 Encounter for screening for osteoporosis: Secondary | ICD-10-CM

## 2022-08-07 DIAGNOSIS — M7062 Trochanteric bursitis, left hip: Secondary | ICD-10-CM | POA: Diagnosis not present

## 2022-08-07 DIAGNOSIS — R9389 Abnormal findings on diagnostic imaging of other specified body structures: Secondary | ICD-10-CM

## 2022-08-07 DIAGNOSIS — Z79899 Other long term (current) drug therapy: Secondary | ICD-10-CM | POA: Diagnosis not present

## 2022-08-07 DIAGNOSIS — M19071 Primary osteoarthritis, right ankle and foot: Secondary | ICD-10-CM

## 2022-08-07 DIAGNOSIS — Z8639 Personal history of other endocrine, nutritional and metabolic disease: Secondary | ICD-10-CM

## 2022-08-07 DIAGNOSIS — Z87891 Personal history of nicotine dependence: Secondary | ICD-10-CM

## 2022-08-07 DIAGNOSIS — M51369 Other intervertebral disc degeneration, lumbar region without mention of lumbar back pain or lower extremity pain: Secondary | ICD-10-CM

## 2022-08-07 DIAGNOSIS — Z8719 Personal history of other diseases of the digestive system: Secondary | ICD-10-CM

## 2022-08-07 DIAGNOSIS — Z8679 Personal history of other diseases of the circulatory system: Secondary | ICD-10-CM

## 2022-08-07 DIAGNOSIS — M19041 Primary osteoarthritis, right hand: Secondary | ICD-10-CM | POA: Diagnosis not present

## 2022-08-07 DIAGNOSIS — Z8659 Personal history of other mental and behavioral disorders: Secondary | ICD-10-CM

## 2022-08-07 DIAGNOSIS — M0579 Rheumatoid arthritis with rheumatoid factor of multiple sites without organ or systems involvement: Secondary | ICD-10-CM | POA: Diagnosis not present

## 2022-08-07 MED ORDER — HYDROXYCHLOROQUINE SULFATE 200 MG PO TABS: ORAL_TABLET | ORAL | 0 refills | Status: DC

## 2022-08-08 NOTE — Progress Notes (Signed)
CBC and CMP are stable.

## 2022-08-19 ENCOUNTER — Other Ambulatory Visit: Payer: Self-pay | Admitting: Cardiology

## 2022-08-19 LAB — CBC WITH DIFFERENTIAL/PLATELET
Absolute Monocytes: 1132 cells/uL — ABNORMAL HIGH (ref 200–950)
Basophils Absolute: 123 cells/uL (ref 0–200)
Basophils Relative: 1.5 %
Eosinophils Absolute: 640 cells/uL — ABNORMAL HIGH (ref 15–500)
Eosinophils Relative: 7.8 %
HCT: 42 % (ref 35.0–45.0)
Hemoglobin: 14.1 g/dL (ref 11.7–15.5)
Lymphs Abs: 1706 cells/uL (ref 850–3900)
MCH: 32 pg (ref 27.0–33.0)
MCHC: 33.6 g/dL (ref 32.0–36.0)
MCV: 95.2 fL (ref 80.0–100.0)
MPV: 9.4 fL (ref 7.5–12.5)
Monocytes Relative: 13.8 %
Neutro Abs: 4600 cells/uL (ref 1500–7800)
Neutrophils Relative %: 56.1 %
Platelets: 300 10*3/uL (ref 140–400)
RBC: 4.41 10*6/uL (ref 3.80–5.10)
RDW: 13.2 % (ref 11.0–15.0)
Total Lymphocyte: 20.8 %
WBC: 8.2 10*3/uL (ref 3.8–10.8)

## 2022-08-19 LAB — COMPLETE METABOLIC PANEL WITH GFR
AG Ratio: 1.4 (calc) (ref 1.0–2.5)
ALT: 22 U/L (ref 6–29)
AST: 33 U/L (ref 10–35)
Albumin: 3.9 g/dL (ref 3.6–5.1)
Alkaline phosphatase (APISO): 69 U/L (ref 37–153)
BUN/Creatinine Ratio: 35 (calc) — ABNORMAL HIGH (ref 6–22)
BUN: 17 mg/dL (ref 7–25)
CO2: 28 mmol/L (ref 20–32)
Calcium: 9.4 mg/dL (ref 8.6–10.4)
Chloride: 100 mmol/L (ref 98–110)
Creat: 0.49 mg/dL — ABNORMAL LOW (ref 0.60–1.00)
Globulin: 2.8 g/dL (calc) (ref 1.9–3.7)
Glucose, Bld: 73 mg/dL (ref 65–99)
Potassium: 4 mmol/L (ref 3.5–5.3)
Sodium: 136 mmol/L (ref 135–146)
Total Bilirubin: 0.7 mg/dL (ref 0.2–1.2)
Total Protein: 6.7 g/dL (ref 6.1–8.1)
eGFR: 96 mL/min/{1.73_m2} (ref >=60)

## 2022-08-19 LAB — HYDROXYCHLOROQUINE,BLOOD: HYDROXYCHLOROQUINE, (B): 610 ng/mL — ABNORMAL HIGH

## 2022-08-19 NOTE — Progress Notes (Signed)
Ok to continue current dose of plaquenil as prescribed.

## 2022-09-30 ENCOUNTER — Ambulatory Visit (INDEPENDENT_AMBULATORY_CARE_PROVIDER_SITE_OTHER): Payer: Medicare Other

## 2022-09-30 DIAGNOSIS — I442 Atrioventricular block, complete: Secondary | ICD-10-CM | POA: Diagnosis not present

## 2022-10-01 LAB — CUP PACEART REMOTE DEVICE CHECK
Battery Remaining Longevity: 39 mo
Battery Voltage: 2.96 V
Brady Statistic AP VP Percent: 0.61 %
Brady Statistic AP VS Percent: 0.03 %
Brady Statistic AS VP Percent: 99.11 %
Brady Statistic AS VS Percent: 0.25 %
Brady Statistic RA Percent Paced: 0.63 %
Brady Statistic RV Percent Paced: 99.2 %
Date Time Interrogation Session: 20231114003227
Implantable Lead Connection Status: 753985
Implantable Lead Connection Status: 753985
Implantable Lead Connection Status: 753985
Implantable Lead Implant Date: 20170818
Implantable Lead Implant Date: 20170818
Implantable Lead Implant Date: 20170818
Implantable Lead Location: 753858
Implantable Lead Location: 753859
Implantable Lead Location: 753860
Implantable Lead Model: 4396
Implantable Lead Model: 5076
Implantable Lead Model: 5076
Implantable Pulse Generator Implant Date: 20170818
Lead Channel Impedance Value: 342 Ohm
Lead Channel Impedance Value: 418 Ohm
Lead Channel Impedance Value: 456 Ohm
Lead Channel Impedance Value: 456 Ohm
Lead Channel Impedance Value: 532 Ohm
Lead Channel Impedance Value: 532 Ohm
Lead Channel Impedance Value: 551 Ohm
Lead Channel Impedance Value: 665 Ohm
Lead Channel Impedance Value: 855 Ohm
Lead Channel Pacing Threshold Amplitude: 0.375 V
Lead Channel Pacing Threshold Amplitude: 0.625 V
Lead Channel Pacing Threshold Amplitude: 0.75 V
Lead Channel Pacing Threshold Pulse Width: 0.4 ms
Lead Channel Pacing Threshold Pulse Width: 0.4 ms
Lead Channel Pacing Threshold Pulse Width: 0.4 ms
Lead Channel Sensing Intrinsic Amplitude: 2 mV
Lead Channel Sensing Intrinsic Amplitude: 2 mV
Lead Channel Sensing Intrinsic Amplitude: 3.875 mV
Lead Channel Sensing Intrinsic Amplitude: 3.875 mV
Lead Channel Setting Pacing Amplitude: 1.5 V
Lead Channel Setting Pacing Amplitude: 1.75 V
Lead Channel Setting Pacing Amplitude: 2 V
Lead Channel Setting Pacing Pulse Width: 0.4 ms
Lead Channel Setting Pacing Pulse Width: 0.4 ms
Lead Channel Setting Sensing Sensitivity: 0.9 mV
Zone Setting Status: 755011
Zone Setting Status: 755011

## 2022-10-04 ENCOUNTER — Encounter: Payer: Self-pay | Admitting: Internal Medicine

## 2022-10-04 ENCOUNTER — Ambulatory Visit: Payer: Medicare Other | Attending: Internal Medicine | Admitting: Internal Medicine

## 2022-10-04 VITALS — BP 90/62 | HR 88 | Ht 62.0 in | Wt 126.6 lb

## 2022-10-04 DIAGNOSIS — Z9861 Coronary angioplasty status: Secondary | ICD-10-CM | POA: Diagnosis not present

## 2022-10-04 DIAGNOSIS — I442 Atrioventricular block, complete: Secondary | ICD-10-CM | POA: Diagnosis not present

## 2022-10-04 DIAGNOSIS — I2510 Atherosclerotic heart disease of native coronary artery without angina pectoris: Secondary | ICD-10-CM

## 2022-10-04 DIAGNOSIS — Z950 Presence of cardiac pacemaker: Secondary | ICD-10-CM | POA: Diagnosis not present

## 2022-10-04 LAB — CUP PACEART INCLINIC DEVICE CHECK
Battery Remaining Longevity: 39 mo
Battery Voltage: 2.96 V
Brady Statistic AP VP Percent: 0.42 %
Brady Statistic AP VS Percent: 0.03 %
Brady Statistic AS VP Percent: 97.12 %
Brady Statistic AS VS Percent: 2.43 %
Brady Statistic RA Percent Paced: 0.44 %
Brady Statistic RV Percent Paced: 96.65 %
Date Time Interrogation Session: 20231117151818
Implantable Lead Connection Status: 753985
Implantable Lead Connection Status: 753985
Implantable Lead Connection Status: 753985
Implantable Lead Implant Date: 20170818
Implantable Lead Implant Date: 20170818
Implantable Lead Implant Date: 20170818
Implantable Lead Location: 753858
Implantable Lead Location: 753859
Implantable Lead Location: 753860
Implantable Lead Model: 4396
Implantable Lead Model: 5076
Implantable Lead Model: 5076
Implantable Pulse Generator Implant Date: 20170818
Lead Channel Impedance Value: 342 Ohm
Lead Channel Impedance Value: 418 Ohm
Lead Channel Impedance Value: 418 Ohm
Lead Channel Impedance Value: 456 Ohm
Lead Channel Impedance Value: 494 Ohm
Lead Channel Impedance Value: 513 Ohm
Lead Channel Impedance Value: 532 Ohm
Lead Channel Impedance Value: 646 Ohm
Lead Channel Impedance Value: 817 Ohm
Lead Channel Pacing Threshold Amplitude: 0.375 V
Lead Channel Pacing Threshold Amplitude: 0.625 V
Lead Channel Pacing Threshold Amplitude: 0.75 V
Lead Channel Pacing Threshold Pulse Width: 0.4 ms
Lead Channel Pacing Threshold Pulse Width: 0.4 ms
Lead Channel Pacing Threshold Pulse Width: 0.4 ms
Lead Channel Sensing Intrinsic Amplitude: 1.25 mV
Lead Channel Sensing Intrinsic Amplitude: 2 mV
Lead Channel Sensing Intrinsic Amplitude: 3.875 mV
Lead Channel Sensing Intrinsic Amplitude: 7.125 mV
Lead Channel Setting Pacing Amplitude: 1.5 V
Lead Channel Setting Pacing Amplitude: 1.75 V
Lead Channel Setting Pacing Amplitude: 2 V
Lead Channel Setting Pacing Pulse Width: 0.4 ms
Lead Channel Setting Pacing Pulse Width: 0.4 ms
Lead Channel Setting Sensing Sensitivity: 0.9 mV
Zone Setting Status: 755011
Zone Setting Status: 755011

## 2022-10-04 NOTE — Patient Instructions (Signed)
Medication Instructions:  Your physician recommends that you continue on your current medications as directed. Please refer to the Current Medication list given to you today.  *If you need a refill on your cardiac medications before your next appointment, please call your pharmacy*  Lab Work: None ordered.  If you have labs (blood work) drawn today and your tests are completely normal, you will receive your results only by: [place] (if you have MyChart) OR A paper copy in the mail If you have any lab test that is abnormal or we need to change your treatment, we will call you to review the results.  Testing/Procedures: None ordered.  Follow-Up: At [facility], you and your health needs are our priority.  As part of our continuing mission to provide you with exceptional heart care, we have created designated Provider Care Teams.  These Care Teams include your primary Cardiologist (physician) and Advanced Practice Providers (APPs -  Physician Assistants and Nurse Practitioners) who all work together to provide you with the care you need, when you need it.  We recommend signing up for the patient portal called "MyChart".  Sign up information is provided on this After Visit Summary.  MyChart is used to connect with patients for Virtual Visits (Telemedicine).  Patients are able to view lab/test results, encounter notes, upcoming appointments, etc.  Non-urgent messages can be sent to your provider as well.   To learn more about what you can do with MyChart, go to [email or web address].    Your next appointment:   1 year(s)  The format for your next appointment:   In Person  Provider:   Cristopher Peru, MD{or one of the following Advanced Practice Providers on your designated Care Team:   Tommye Standard, [place] Legrand Como "Jonni Sanger" Chalmers Cater, [place]  Remote monitoring is used to monitor your Pacemaker from home. This monitoring reduces the number of office visits required to check your device to  one time per year. It allows [place] to keep an eye on the functioning of your device to ensure it is working properly. You are scheduled for a device check from home on 12/30/22. You may send your transmission at any time that day. If you have a wireless device, the transmission will be sent automatically. After your physician reviews your transmission, you will receive a postcard with your next transmission date.  Important Information About Sugar

## 2022-10-04 NOTE — Progress Notes (Signed)
HPI [name] returns today for followup of her biv PPM. She is a pleasant 76 yo woman with a  H/o HTN, CAD, s/p MI, chronic systolic heart failure, s/p biv PPM insertion in 2017. In the interim, she notes she has done well with no chest pain or sob, or edema.   Allergies  Allergen Reactions   Betadine [Povidone Iodine] Shortness Of Breath   Hydrocodone Itching     Current Outpatient Medications  Medication Sig Dispense Refill   acetaminophen (TYLENOL) 325 MG tablet Take 325-650 mg by mouth every 6 (six) hours as needed for mild pain, headache or fever.     albuterol (PROVENTIL HFA;VENTOLIN HFA) 108 (90 Base) MCG/ACT inhaler Inhale 2 puffs into the lungs every 6 (six) hours as needed for wheezing or shortness of breath.     aspirin 81 MG tablet Take 81 mg by mouth daily.     atorvastatin (LIPITOR) 40 MG tablet TAKE 1 TABLET(40 MG) BY MOUTH DAILY AT 6 PM 90 tablet 3   calcium carbonate (OS-CAL) 600 MG TABS Take 600 mg by mouth 2 (two) times daily with a meal.     Cholecalciferol (VITAMIN D3) 50 MCG (2000 UT) TABS Take 2,000 Units by mouth daily.     citalopram (CELEXA) 20 MG tablet Take 20 mg by mouth daily.  3   clonazePAM (KLONOPIN) 0.5 MG tablet Take 0.25-0.5 mg by mouth daily as needed for anxiety.  0   fluticasone-salmeterol (ADVAIR HFA) 115-21 MCG/ACT inhaler Inhale 2 puffs into the lungs 2 (two) times daily.     folic acid (FOLVITE) 1 MG tablet TAKE 1 TABLET BY MOUTH DAILY 90 tablet 3   hydroxychloroquine (PLAQUENIL) 200 MG tablet Take 1 tablet by mouth, Monday through Friday only. 60 tablet 0   losartan-hydrochlorothiazide (HYZAAR) 50-12.5 MG tablet TAKE 1 TABLET BY MOUTH DAILY 90 tablet 2   melatonin 3 MG TABS tablet Take 3 mg by mouth at bedtime as needed (for sleep).     methotrexate (RHEUMATREX) 2.5 MG tablet TAKE 4 TABLETS BY MOUTH 1 TIME WEEKLY 48 tablet 0   nitroGLYCERIN (NITROSTAT) 0.4 MG SL tablet Place 1 tablet (0.4 mg total) under the tongue every 5 (five)  minutes as needed for chest pain. 25 tablet 2   pantoprazole (PROTONIX) 40 MG tablet TAKE 1 TABLET(40 MG) BY MOUTH DAILY (Patient taking differently: Take 40 mg by mouth daily before breakfast.) 90 tablet 3   traZODone (DESYREL) 50 MG tablet Take 50 mg by mouth at bedtime as needed for sleep.     No current facility-administered medications for this visit.     Past Medical History:  Diagnosis Date   Anxiety    Asthma    Breast cancer ([place])    left breast   Carpal tunnel syndrome    Depression    Habitual alcohol use    Heart attack ([place])    Hypertension    Interstitial lung disease ([facility])    Personal history of chemotherapy    Personal history of radiation therapy    RA (rheumatoid arthritis) ([place])    Spinal stenosis    Vision abnormalities     ROS:   All systems reviewed and negative except as noted in the HPI.   Past Surgical History:  Procedure Laterality Date   BREAST BIOPSY     BREAST LUMPECTOMY  2005   left   CARDIAC CATHETERIZATION N/A 07/04/2016   Procedure: Left Heart Cath and Coronary Angiography;  Surgeon:  Belva Crome, MD;  Location: [facility];  Service: Cardiovascular;  Laterality: N/A;   CARDIAC CATHETERIZATION N/A 07/04/2016   Procedure: Temporary Pacemaker;  Surgeon: Belva Crome, MD;  Location: [facility];  Service: Cardiovascular;  Laterality: N/A;   CARPAL TUNNEL RELEASE     CATARACT EXTRACTION Bilateral    EP IMPLANTABLE DEVICE N/A 07/05/2016   Procedure: Pacemaker Implant;  Surgeon: Evans Lance, MD;  Location: [facility];  Service: Cardiovascular;  Laterality: N/A;   herniated rectum repair   11/2020   LAMINECTOMY  1966   LYMPHECTOMY  2005     Family History  Problem Relation Age of Onset   Stroke Mother    Cancer Father        colon?   Asthma Father    Pyelonephritis Sister    Pulmonary fibrosis Sister    Asthma Paternal Grandfather    Cancer Paternal Aunt    Cancer Paternal Uncle      Social History    Socioeconomic History   Marital status: Widowed    Spouse name: Not on file   Number of children: Not on file   Years of education: Not on file   Highest education level: Not on file  Occupational History   Not on file  Tobacco Use   Smoking status: Former    Packs/day: 2.00    Years: 25.00    Total pack years: 50.00    Types: Cigarettes    Quit date: 69    Years since quitting: 28.8    Passive exposure: Past   Smokeless tobacco: Never  Vaping Use   Vaping Use: Never used  Substance and Sexual Activity   Alcohol use: Yes    Alcohol/week: 14.0 standard drinks of alcohol    Types: 14 Cans of beer per week    Comment: 2 DAILY   Drug use: Never   Sexual activity: Not on file  Other Topics Concern   Not on file  Social History Narrative   Not on file   Social Determinants of Health   Financial Resource Strain: Not on file  Food Insecurity: Not on file  Transportation Needs: Not on file  Physical Activity: Not on file  Stress: Not on file  Social Connections: Not on file  Intimate Partner Violence: Not on file     BP 90/62   Pulse 88   Ht '5\' 2"'$  (1.575 m)   Wt 126 lb 9.6 oz (57.4 kg)   SpO2 96%   BMI 23.16 kg/m   Physical Exam:  Well appearing NAD HEENT: Unremarkable Neck:  No JVD, no thyromegally Lymphatics:  No adenopathy Back:  No CVA tenderness Lungs:  Clear with no wheezes HEART:  Regular rate rhythm, no murmurs, no rubs, no clicks Abd:  soft, positive bowel sounds, no organomegally, no rebound, no guarding Ext:  2 plus pulses, no edema, no cyanosis, no clubbing Skin:  No rashes no nodules Neuro:  CN II through XII intact, motor grossly intact  EKG - nsr with biv pacing  DEVICE  Normal device function.  See PaceArt for details.   Assess/Plan:  1. Chronic systolic heart failure -  Her symptoms remain class 2. No change in her meds. 2. Biv PPM - her medtronic biv device is working normally. 3. CAD - she denies anginal symptoms.  4.  Dyslipidemia - she will continue her statin therapy with lipitor.   Brandy Overlie Hurschel Paynter,MD

## 2022-10-07 ENCOUNTER — Other Ambulatory Visit: Payer: Self-pay | Admitting: Physician Assistant

## 2022-10-07 NOTE — Telephone Encounter (Signed)
Next Visit: 11/28/2022  Last Visit: 08/07/2022  Last Fill: 07/17/2022  DX: Rheumatoid arthritis involving multiple sites with positive rheumatoid factor   Current Dose per office note on 08/07/2022: Methotrexate 4 tablets by mouth once weekly   Labs: 08/07/2022 CBC and CMP are stable.   Okay to refill methotrexate?

## 2022-10-26 ENCOUNTER — Other Ambulatory Visit: Payer: Self-pay | Admitting: Physician Assistant

## 2022-10-26 DIAGNOSIS — M0579 Rheumatoid arthritis with rheumatoid factor of multiple sites without organ or systems involvement: Secondary | ICD-10-CM

## 2022-10-28 NOTE — Telephone Encounter (Signed)
Next Visit: 11/28/2022  Last Visit: 08/07/2022  Labs: 08/07/2022 CBC and CMP are stable.   Eye exam: 04/12/2022   Current Dose per office note on 08/07/2022: hydroxychloroquine 200 mg daily Monday to Friday.   XU:XYBFXOVANV arthritis involving multiple sites with positive rheumatoid factor   Last Fill: 08/07/2022  Okay to refill Plaquenil?

## 2022-10-30 DIAGNOSIS — R7303 Prediabetes: Secondary | ICD-10-CM | POA: Diagnosis not present

## 2022-10-30 DIAGNOSIS — M25552 Pain in left hip: Secondary | ICD-10-CM | POA: Diagnosis not present

## 2022-10-30 DIAGNOSIS — I10 Essential (primary) hypertension: Secondary | ICD-10-CM | POA: Diagnosis not present

## 2022-11-04 ENCOUNTER — Other Ambulatory Visit: Payer: Self-pay | Admitting: Interventional Cardiology

## 2022-11-04 ENCOUNTER — Other Ambulatory Visit: Payer: Self-pay | Admitting: Physician Assistant

## 2022-11-05 NOTE — Telephone Encounter (Signed)
Next Visit: 11/28/2022   Last Visit: 08/07/2022  Current Dose per office note on 3/89/3734:  folic acid 2 mg by mouth daily   KA:JGOTLXBWIO arthritis involving multiple sites with positive rheumatoid factor    Last Fill: 11/05/2021  Okay to refill Folic Acid?

## 2022-11-13 NOTE — Progress Notes (Signed)
Remote pacemaker transmission.   

## 2022-11-14 ENCOUNTER — Emergency Department (HOSPITAL_COMMUNITY): Payer: Medicare Other

## 2022-11-14 ENCOUNTER — Emergency Department (HOSPITAL_COMMUNITY)
Admission: EM | Admit: 2022-11-14 | Discharge: 2022-11-15 | Disposition: A | Discharge status: Home / Self Care | Payer: Medicare Other | Attending: Emergency Medicine | Admitting: Emergency Medicine

## 2022-11-14 ENCOUNTER — Encounter (HOSPITAL_COMMUNITY): Payer: Self-pay

## 2022-11-14 ENCOUNTER — Other Ambulatory Visit: Payer: Self-pay

## 2022-11-14 ENCOUNTER — Telehealth: Payer: Self-pay | Admitting: Internal Medicine

## 2022-11-14 DIAGNOSIS — T82897A Other specified complication of cardiac prosthetic devices, implants and grafts, initial encounter: Secondary | ICD-10-CM | POA: Insufficient documentation

## 2022-11-14 DIAGNOSIS — R079 Chest pain, unspecified: Secondary | ICD-10-CM | POA: Diagnosis not present

## 2022-11-14 DIAGNOSIS — R6889 Other general symptoms and signs: Secondary | ICD-10-CM | POA: Diagnosis not present

## 2022-11-14 DIAGNOSIS — I2510 Atherosclerotic heart disease of native coronary artery without angina pectoris: Secondary | ICD-10-CM | POA: Diagnosis not present

## 2022-11-14 DIAGNOSIS — Y69 Unspecified misadventure during surgical and medical care: Secondary | ICD-10-CM | POA: Insufficient documentation

## 2022-11-14 DIAGNOSIS — J4520 Mild intermittent asthma, uncomplicated: Secondary | ICD-10-CM | POA: Diagnosis not present

## 2022-11-14 DIAGNOSIS — Z950 Presence of cardiac pacemaker: Secondary | ICD-10-CM | POA: Diagnosis not present

## 2022-11-14 DIAGNOSIS — R0789 Other chest pain: Secondary | ICD-10-CM | POA: Diagnosis not present

## 2022-11-14 DIAGNOSIS — I219 Acute myocardial infarction, unspecified: Secondary | ICD-10-CM | POA: Insufficient documentation

## 2022-11-14 DIAGNOSIS — J42 Unspecified chronic bronchitis: Secondary | ICD-10-CM | POA: Diagnosis not present

## 2022-11-14 DIAGNOSIS — Z7951 Long term (current) use of inhaled steroids: Secondary | ICD-10-CM | POA: Insufficient documentation

## 2022-11-14 DIAGNOSIS — T829XXA Unspecified complication of cardiac and vascular prosthetic device, implant and graft, initial encounter: Secondary | ICD-10-CM

## 2022-11-14 DIAGNOSIS — Z7982 Long term (current) use of aspirin: Secondary | ICD-10-CM | POA: Insufficient documentation

## 2022-11-14 DIAGNOSIS — I10 Essential (primary) hypertension: Secondary | ICD-10-CM | POA: Diagnosis not present

## 2022-11-14 DIAGNOSIS — Z743 Need for continuous supervision: Secondary | ICD-10-CM | POA: Diagnosis not present

## 2022-11-14 LAB — CBC WITH DIFFERENTIAL/PLATELET
Abs Immature Granulocytes: 0.02 10*3/uL (ref 0.00–0.07)
Basophils Absolute: 0.1 10*3/uL (ref 0.0–0.1)
Basophils Relative: 1 %
Eosinophils Absolute: 0.4 10*3/uL (ref 0.0–0.5)
Eosinophils Relative: 5 %
HCT: 38 % (ref 36.0–46.0)
Hemoglobin: 13.1 g/dL (ref 12.0–15.0)
Immature Granulocytes: 0 %
Lymphocytes Relative: 22 %
Lymphs Abs: 1.8 10*3/uL (ref 0.7–4.0)
MCH: 32.5 pg (ref 26.0–34.0)
MCHC: 34.5 g/dL (ref 30.0–36.0)
MCV: 94.3 fL (ref 80.0–100.0)
Monocytes Absolute: 1 10*3/uL (ref 0.1–1.0)
Monocytes Relative: 11 %
Neutro Abs: 5.2 10*3/uL (ref 1.7–7.7)
Neutrophils Relative %: 61 %
Platelets: 290 10*3/uL (ref 150–400)
RBC: 4.03 MIL/uL (ref 3.87–5.11)
RDW: 14.3 % (ref 11.5–15.5)
WBC: 8.5 10*3/uL (ref 4.0–10.5)
nRBC: 0 % (ref 0.0–0.2)

## 2022-11-14 LAB — COMPREHENSIVE METABOLIC PANEL
ALT: 19 U/L (ref 0–44)
AST: 32 U/L (ref 15–41)
Albumin: 3.2 g/dL — ABNORMAL LOW (ref 3.5–5.0)
Alkaline Phosphatase: 76 U/L (ref 38–126)
Anion gap: 8 (ref 5–15)
BUN: 16 mg/dL (ref 8–23)
CO2: 26 mmol/L (ref 22–32)
Calcium: 8.9 mg/dL (ref 8.9–10.3)
Chloride: 104 mmol/L (ref 98–111)
Creatinine, Ser: 0.63 mg/dL (ref 0.44–1.00)
GFR, Estimated: >60 mL/min (ref >60)
Glucose, Bld: 109 mg/dL — ABNORMAL HIGH (ref 70–99)
Potassium: 4.2 mmol/L (ref 3.5–5.1)
Sodium: 138 mmol/L (ref 135–145)
Total Bilirubin: 0.6 mg/dL (ref 0.3–1.2)
Total Protein: 6.1 g/dL — ABNORMAL LOW (ref 6.5–8.1)

## 2022-11-14 LAB — TROPONIN I (HIGH SENSITIVITY)
Troponin I (High Sensitivity): 26 ng/L — ABNORMAL HIGH (ref <18)
Troponin I (High Sensitivity): 28 ng/L — ABNORMAL HIGH (ref <18)

## 2022-11-14 LAB — BRAIN NATRIURETIC PEPTIDE: B Natriuretic Peptide: 168.2 pg/mL — ABNORMAL HIGH (ref 0.0–100.0)

## 2022-11-14 LAB — MAGNESIUM: Magnesium: 1.8 mg/dL (ref 1.7–2.4)

## 2022-11-14 NOTE — Telephone Encounter (Signed)
Pt c/o of Chest Pain: STAT if CP now or developed within 24 hours  1. Are you having CP right now? Pressure comes every 6-7 seconds  2. Are you experiencing any other symptoms (ex. SOB, nausea, vomiting, sweating)?   No  3. How long have you been experiencing CP?   Started this morning around 10-11am  4. Is your CP continuous or coming and going?   Coming and going  5. Have you taken Nitroglycerin? ?   Patient stated she is having unusual intermittent pressure around her pacemaker and she would like a call back to discuss this.

## 2022-11-14 NOTE — ED Provider Notes (Signed)
[facility] EMERGENCY DEPARTMENT Provider Note   CSN: [id number] Arrival date & time: 11/14/22  1809     History  Chief Complaint  Patient presents with   Pacemaker Problem    [name] is a 76 y.o. female.  History of mild intermittent asthma, CAD s/p LAD thrombectomy without stenting in 2017, RA, GERD, hyperlipidemia, complete heart block s/p pacemaker.  She presents to the emergency department tonight for feeling jolts in the area of her pacemaker.  It is happening every 30 seconds or so, last for couple seconds and then goes away.  Describes the pain as a burning pressure-like sensation around the outside and under portion of her pacemaker.  This started today.  Has never had anything like this before.  Pacemaker has been in place for about 7 years now.  Has not had any issues until now.  No associated chest pain anywhere else, shortness of breath.  No recent infectious symptoms.  No rash or redness over the pacemaker anywhere else.  Otherwise feeling well. HPI     Home Medications Prior to Admission medications   Medication Sig Start Date End Date Taking? Authorizing Provider  acetaminophen (TYLENOL) 325 MG tablet Take 325-650 mg by mouth every 6 (six) hours as needed for mild pain, headache or fever.    [provider]  albuterol (PROVENTIL HFA;VENTOLIN HFA) 108 (90 Base) MCG/ACT inhaler Inhale 2 puffs into the lungs every 6 (six) hours as needed for wheezing or shortness of breath.    [provider]  aspirin 81 MG tablet Take 81 mg by mouth daily.    [provider]  atorvastatin (LIPITOR) 40 MG tablet TAKE 1 TABLET(40 MG) BY MOUTH DAILY AT 6 PM 06/05/22   Belva Crome, MD  calcium carbonate (OS-CAL) 600 MG TABS Take 600 mg by mouth 2 (two) times daily with a meal.    [provider]  Cholecalciferol (VITAMIN D3) 50 MCG (2000 UT) TABS Take 2,000 Units by mouth daily.    [provider]  citalopram (CELEXA) 20  MG tablet Take 20 mg by mouth daily. 04/28/17   [provider]  clonazePAM (KLONOPIN) 0.5 MG tablet Take 0.25-0.5 mg by mouth daily as needed for anxiety. 06/12/16   [provider]  fluticasone-salmeterol (ADVAIR HFA) 115-21 MCG/ACT inhaler Inhale 2 puffs into the lungs 2 (two) times daily.    [provider]  folic acid (FOLVITE) 1 MG tablet TAKE 1 TABLET BY MOUTH DAILY 11/05/22   Ofilia Neas, PA-C  hydroxychloroquine (PLAQUENIL) 200 MG tablet TAKE 1 TABLET BY MOUTH MONDAY THROUGH FRIDAY ONLY 10/28/22   Ofilia Neas, PA-C  losartan-hydrochlorothiazide [facility]) 50-12.5 MG tablet TAKE 1 TABLET BY MOUTH DAILY 08/19/22   Belva Crome, MD  melatonin 3 MG TABS tablet Take 3 mg by mouth at bedtime as needed (for sleep).    [provider]  methotrexate (RHEUMATREX) 2.5 MG tablet TAKE 4 TABLETS BY MOUTH 1 TIME WEEKLY 10/08/22   Bo Merino, MD  nitroGLYCERIN (NITROSTAT) 0.4 MG SL tablet Place 1 tablet (0.4 mg total) under the tongue every 5 (five) minutes as needed for chest pain. 05/24/21   Isaiah Serge, NP  pantoprazole (PROTONIX) 40 MG tablet TAKE 1 TABLET(40 MG) BY MOUTH DAILY 11/04/22   Belva Crome, MD  traZODone (DESYREL) 50 MG tablet Take 50 mg by mouth at bedtime as needed for sleep. 07/25/20   [provider]      Allergies  Betadine [povidone iodine] and Hydrocodone    Review of Systems   Review of Systems  Physical Exam Updated Vital Signs BP (!) 160/67   Pulse 71   Temp 98.3 F (36.8 C) (Oral)   Resp 18   Ht '5\' 2"'$  (1.575 m)   Wt 56.7 kg   SpO2 97%   BMI 22.86 kg/m  Physical Exam Vitals and nursing note reviewed.  Constitutional:      General: She is not in acute distress.    Appearance: She is well-developed. She is not ill-appearing or diaphoretic.  HENT:     Head: Normocephalic and atraumatic.     Right Ear: External ear normal.     Left Ear: External ear normal.  Eyes:     Conjunctiva/sclera: Conjunctivae  normal.  Cardiovascular:     Rate and Rhythm: Normal rate and regular rhythm.     Heart sounds: No murmur heard. Pulmonary:     Effort: Pulmonary effort is normal. No respiratory distress.     Breath sounds: Normal breath sounds.  Chest:     Chest wall: Tenderness present.    Abdominal:     Palpations: Abdomen is soft.     Tenderness: There is no abdominal tenderness. There is no guarding or rebound.  Musculoskeletal:        General: No swelling.     Cervical back: Neck supple.     Right lower leg: No edema.     Left lower leg: No edema.  Skin:    General: Skin is warm and dry.     Capillary Refill: Capillary refill takes less than 2 seconds.  Neurological:     General: No focal deficit present.     Mental Status: She is alert and oriented to person, place, and time.  Psychiatric:        Mood and Affect: Mood normal.        Behavior: Behavior normal.     ED Results / Procedures / Treatments   Labs (all labs ordered are listed, but only abnormal results are displayed) Labs Reviewed  COMPREHENSIVE METABOLIC PANEL - Abnormal; Notable for the following components:      Result Value   Glucose, Bld 109 (*)    Total Protein 6.1 (*)    Albumin 3.2 (*)    All other components within normal limits  BRAIN NATRIURETIC PEPTIDE - Abnormal; Notable for the following components:   B Natriuretic Peptide 168.2 (*)    All other components within normal limits  TROPONIN I (HIGH SENSITIVITY) - Abnormal; Notable for the following components:   Troponin I (High Sensitivity) 26 (*)    All other components within normal limits  TROPONIN I (HIGH SENSITIVITY) - Abnormal; Notable for the following components:   Troponin I (High Sensitivity) 28 (*)    All other components within normal limits  CBC WITH DIFFERENTIAL/PLATELET  MAGNESIUM    EKG EKG Interpretation  Date/Time:  Thursday November 14 2022 18:15:16 EST Ventricular Rate:  64 PR Interval:  132 QRS Duration: 148 QT  Interval:  477 QTC Calculation: 493 R Axis:   174 Text Interpretation: Sinus rhythm IVCD, consider atypical RBBB Left ventricular hypertrophy Inferior infarct, acute (RCA) Lateral leads are also involved Probable RV involvement, suggest recording right precordial leads >>> Acute MI <<< Confirmed by Campbell Stall ([id number]) on 47/82/9562 7:31:17 PM  Radiology DG Chest 2 View  Result Date: 11/14/2022 CLINICAL DATA:  Pacemaker evaluation. Patient has been feeling shocks EXAM: CHEST - 2 VIEW COMPARISON:  02/23/2022 FINDINGS: Left chest wall CRT-P is in unchanged position compared to 02/23/2022. Leads project over the expected location of the right atrium, right ventricle, and coronary sinus. Chronic bronchitic changes. No focal consolidation, pleural effusion, or pneumothorax. No acute osseous abnormality. Elevated left hemidiaphragm. IMPRESSION: No acute abnormality.  Unchanged position of the left CRT-P. Electronically Signed   By: Placido Sou M.D.   On: 11/14/2022 19:17    Procedures Procedures    Medications Ordered in ED Medications - No data to display  ED Course/ Medical Decision Making/ A&P                           Medical Decision Making Problems Addressed: Disorder of cardiac pacemaker system, initial encounter: acute illness or injury that poses a threat to life or bodily functions  Amount and/or Complexity of Data Reviewed Independent Historian: caregiver    Details: Daughter Labs: ordered. Decision-making details documented in ED Course. Radiology: ordered and independent interpretation performed. Decision-making details documented in ED Course. ECG/medicine tests: ordered and independent interpretation performed. Decision-making details documented in ED Course.  Risk OTC drugs.   This is a 76 y.o. female who presents to the emergency department with pain around her pacemaker site. History is obtained from patient and daughter. Medical history reviewed in EMR.  Past  medical/surgical history that increases complexity of ED encounter:  mild intermittent asthma, CAD s/p LAD thrombectomy without stenting in 2017, RA, GERD, hyperlipidemia, complete heart block s/p pacemaker  Initial Assessment:  Patient presenting with pain around her pacemaker site.  This is not a defibrillator, should not be having any shocks.  There are no signs on physical exam of infection.  On palpation and further physical exam, there does not appear to be any concern for or signs of an underlying seroma or pacemaker pocket infection.  She is overall very well-appearing.  No other symptoms.  Will obtain cardiac labs to evaluate and make sure she is not having any electrolyte abnormalities, elevated troponin or BNP.  At this time, low suspicion for ACS or other intrathoracic pathology such as pneumonia, PE.  Believe it is mostly musculoskeletal in origin.  Additionally, will interrogate the pacemaker  Interpretation of Diagnostics: I personally reviewed the EKG, chest x-ray, labs and my interpretation is as follows: Troponins are 26 and 28, when compared to prior these are decreased from before.  Delta is very small.  Electrolytes unremarkable, normal white blood cell count.  BNP only slightly elevated 160.  Pacer interrogation- no arrhythmias or high rate episodes, predominantly paced in ventricle but other battery and lead measurements in expected range and stable. Appears to be functioning as expected.   At this time, she is very well-appearing.  Labs are relatively unremarkable.  Believe she is stable for discharge with outpatient follow-up and evaluation with cardiology.  Discussed using over-the-counter pain medications and conservative therapies for her pain in the meantime until she is able to follow-up with cardiology.  MDM generated using voice dictation software and may contain dictation errors. Please contact me for any clarification or with any questions.          Final  Clinical Impression(s) / ED Diagnoses Final diagnoses:  Disorder of cardiac pacemaker system, initial encounter    Rx / DC Orders ED Discharge Orders          Ordered    Ambulatory referral to Cardiology       Comments: If you have not  heard from the Cardiology office within the next 72 hours please call [phone].   11/14/22 2348              Phyllis Ginger, MD 19/62/22 9798    Lianne Cure, DO 92/11/94 [phone]

## 2022-11-14 NOTE — Telephone Encounter (Signed)
I spoke with patient.  She reports chest pressure near pacemaker area in her left chest.  Thought bra strap might be too tight but she removed this with no change in her symptoms.  Pressure is every 6-7 breaths.  It has been occurring since around 10 AM.  Patient has never had issues with pacemaker pressure/pain in the past.  Today's symptoms are new for her.  History of LAD thrombectomy.  She reports she passed out prior to this event and does not recall having any chest pain at that time.  Due to onset of new chest pressure symptoms I advised patient to call 911 for transport to hospital.  Patient agreeable to this plan.

## 2022-11-14 NOTE — ED Triage Notes (Signed)
Patient arrived to the ED via EMS. Patient complains of feeling a "jolt" in the area of her pacemaker every 5-6 breaths. Patient states this started today and hasn't happened in the past. Patient states that it is not a defibrillator. Patient denies chest pain, SOB, N/V/D. Patient is A&Ox4.

## 2022-11-14 NOTE — ED Notes (Signed)
Provider notified that pacemaker interrogation Is in process and RN's are having difficulty getting it to fax

## 2022-11-14 NOTE — ED Notes (Signed)
Provider at bedside

## 2022-11-21 NOTE — Progress Notes (Signed)
Office Visit Note  Patient: [name]             Date of Birth: 1946/08/16           MRN: [id number]             PCP: Glenis Smoker, MD Referring: Glenis Smoker, * Visit Date: 11/28/2022 Occupation: '@GUAROCC'$ @  Subjective:  Follow-up (Pain and swelling in both hands)   History of Present Illness: [name] is a 77 y.o. female history of seropositive rheumatoid Tritus and osteoarthritis.  She states she has been having increased pain and discomfort in her bilateral hands for last 2 to 3 months.  She notices increased morning stiffness.  She has difficulty doing routine activities.  She still has discomfort in her right second and third trigger finger.  She continues to have some shortness of breath.  She states she was seen by Dr. Shearon Stalls but she is on maternity leave.  She has difficulty climbing stairs due to left trochanteric bursitis.  Of the other joints are painful or swollen.    Activities of Daily Living:  Patient reports morning stiffness for 30 minutes.   Patient Reports nocturnal pain.  Difficulty dressing/grooming: Denies Difficulty climbing stairs: Reports Difficulty getting out of chair: Denies Difficulty using hands for taps, buttons, cutlery, and/or writing: Reports  Review of Systems  Constitutional:  Negative for fatigue.  HENT:  Positive for mouth dryness. Negative for mouth sores.   Eyes:  Negative for dryness.  Respiratory:  Positive for shortness of breath.   Cardiovascular:  Negative for chest pain and palpitations.  Gastrointestinal:  Negative for blood in stool, constipation and diarrhea.  Endocrine: Negative for increased urination.  Genitourinary:  Negative for involuntary urination.  Musculoskeletal:  Positive for joint pain, gait problem, joint pain, joint swelling and morning stiffness. Negative for myalgias, muscle weakness, muscle tenderness and myalgias.  Skin:  Negative for color change, rash, hair loss and sensitivity  to sunlight.  Allergic/Immunologic: Negative for susceptible to infections.  Neurological:  Negative for dizziness and headaches.  Hematological:  Negative for swollen glands.  Psychiatric/Behavioral:  Negative for depressed mood and sleep disturbance. The patient is nervous/anxious.     PMFS History:  Patient Active Problem List   Diagnosis Date Noted   Acute respiratory failure with hypoxia ([place]) 02/24/2022   GERD (gastroesophageal reflux disease) 02/24/2022   Elevated troponin 02/24/2022   Bilateral carpal tunnel syndrome 04/14/2018   Cervical spinal stenosis 01/07/2018   Cervical radiculopathy 01/07/2018   Stenosis of left subclavian artery ([place]) 12/15/2017   History of gastroesophageal reflux (GERD) 06/25/2017   History of bilateral carpal tunnel release 06/25/2017   Former smoker 01/25/2017   Rheumatoid arthritis involving multiple sites with positive rheumatoid factor ([place]) 01/17/2017   High risk medication use 01/17/2017   Primary osteoarthritis of both hands 01/17/2017   Primary osteoarthritis of both feet 01/17/2017   DJD (degenerative joint disease), cervical 01/17/2017   Spondylosis of lumbar region without myelopathy or radiculopathy 01/17/2017   Cardiomyopathy, ischemic 07/07/2016   CAD- S/P LAD thrombectomy 07/04/16 07/07/2016   Pacemaker-MDT BiV placed 07/05/16 07/07/2016   History of rheumatoid arthritis 07/07/2016   Complete heart block ([place]) 07/04/2016   Essential hypertension 07/04/2016   LBBB (left bundle branch block) 07/04/2016   Exostosis 06/18/2011   History of breast cancer 02/16/2010   Vitamin D deficiency 02/15/2010   HYPERCHOLESTEROLEMIA 02/15/2010   Anxiety state 02/15/2010   Asthma exacerbation 02/15/2010   MIGRAINES, HX OF 02/15/2010  MASTECTOMY, LEFT, HX OF 02/15/2010    Past Medical History:  Diagnosis Date   Anxiety    Asthma    Breast cancer ([place])    left breast   Carpal tunnel syndrome    Depression    Habitual alcohol use     Heart attack ([place])    Hypertension    Interstitial lung disease (HCC)    Personal history of chemotherapy    Personal history of radiation therapy    RA (rheumatoid arthritis) ([place])    Spinal stenosis    Vision abnormalities     Family History  Problem Relation Age of Onset   Stroke Mother    Cancer Father        colon?   Asthma Father    Pyelonephritis Sister    Pulmonary fibrosis Sister    Asthma Paternal Grandfather    Cancer Paternal Aunt    Cancer Paternal Uncle    Past Surgical History:  Procedure Laterality Date   BREAST BIOPSY     BREAST LUMPECTOMY  2005   left   CARDIAC CATHETERIZATION N/A 07/04/2016   Procedure: Left Heart Cath and Coronary Angiography;  Surgeon: Belva Crome, MD;  Location: [facility];  Service: Cardiovascular;  Laterality: N/A;   CARDIAC CATHETERIZATION N/A 07/04/2016   Procedure: Temporary Pacemaker;  Surgeon: Belva Crome, MD;  Location: [facility];  Service: Cardiovascular;  Laterality: N/A;   CARPAL TUNNEL RELEASE     CATARACT EXTRACTION Bilateral    EP IMPLANTABLE DEVICE N/A 07/05/2016   Procedure: Pacemaker Implant;  Surgeon: Evans Lance, MD;  Location: [facility];  Service: Cardiovascular;  Laterality: N/A;   herniated rectum repair   11/2020   LAMINECTOMY  1966   LYMPHECTOMY  2005   Social History   Social History Narrative   Not on file   Immunization History  Administered Date(s) Administered   Fluad Quad(high Dose 65+) 08/16/2019, 08/05/2022   Influenza Split 09/10/2007, 09/05/2009, 08/05/2011, 09/15/2012, 08/12/2013   Influenza Whole 08/21/2009, 08/29/2010   Influenza, High Dose Seasonal PF 08/30/2021   Influenza,inj,Quad PF,6+ Mos 08/21/2016, 09/24/2017, 07/27/2018, 08/21/2020   Influenza,inj,quad, With Preservative 08/01/2014, 08/16/2015   PFIZER(Purple Top)SARS-COV-2 Vaccination 01/14/2020, 02/08/2020, 08/08/2020, 04/09/2021   Pneumococcal Conjugate-13 08/01/2014   Pneumococcal Polysaccharide-23  05/08/2009, 04/23/2022   Td 01/26/2018   Tdap 05/06/2007   Zoster, Live 06/16/2009     Objective: Vital Signs: BP (!) 155/67 (BP Location: Right Arm, Patient Position: Sitting, Cuff Size: Normal)   Pulse 84   Resp 14   Ht '5\' 2"'$  (1.575 m)   Wt 125 lb (56.7 kg)   BMI 22.86 kg/m    Physical Exam Vitals and nursing note reviewed.  Constitutional:      Appearance: She is well-developed.  HENT:     Head: Normocephalic and atraumatic.  Eyes:     Conjunctiva/sclera: Conjunctivae normal.  Cardiovascular:     Rate and Rhythm: Normal rate and regular rhythm.     Heart sounds: Normal heart sounds.  Pulmonary:     Effort: Pulmonary effort is normal.     Breath sounds: Normal breath sounds.  Abdominal:     General: Bowel sounds are normal.     Palpations: Abdomen is soft.  Musculoskeletal:     Cervical back: Normal range of motion.  Lymphadenopathy:     Cervical: No cervical adenopathy.  Skin:    General: Skin is warm and dry.     Capillary Refill: Capillary refill  takes less than 2 seconds.  Neurological:     Mental Status: She is alert and oriented to person, place, and time.  Psychiatric:        Behavior: Behavior normal.      Musculoskeletal Exam: She had limited lateral rotation of the cervical spine.  She had thoracic kyphosis.  She had discomfort range of motion of lumbar spine.  Shoulder joint abduction was limited to 110 degrees.  She had contracture in her right elbow which is unchanged.  No synovitis was noted.  She had bilateral MCP thickening without any synovitis.  She had bilateral PIP and DIP thickening.  Hip joints and knee joints in good range of motion without any warmth swelling or effusion.  There was no tenderness over ankles or MTPs. CDAI Exam: CDAI Score: -- Patient Global: 5 mm; Provider Global: 3 mm Swollen: --; Tender: -- Joint Exam 11/28/2022   No joint exam has been documented for this visit   There is currently no information documented on the  homunculus. Go to the Rheumatology activity and complete the homunculus joint exam.  Investigation: No additional findings.  Imaging: DG Chest 2 View  Result Date: 11/14/2022 CLINICAL DATA:  Pacemaker evaluation. Patient has been feeling shocks EXAM: CHEST - 2 VIEW COMPARISON:  02/23/2022 FINDINGS: Left chest wall CRT-P is in unchanged position compared to 02/23/2022. Leads project over the expected location of the right atrium, right ventricle, and coronary sinus. Chronic bronchitic changes. No focal consolidation, pleural effusion, or pneumothorax. No acute osseous abnormality. Elevated left hemidiaphragm. IMPRESSION: No acute abnormality.  Unchanged position of the left CRT-P. Electronically Signed   By: Placido Sou M.D.   On: 11/14/2022 19:17    Recent Labs: Lab Results  Component Value Date   WBC 8.5 11/14/2022   HGB 13.1 11/14/2022   PLT 290 11/14/2022   NA 138 11/14/2022   K 4.2 11/14/2022   CL 104 11/14/2022   CO2 26 11/14/2022   GLUCOSE 109 (H) 11/14/2022   BUN 16 11/14/2022   CREATININE 0.63 11/14/2022   BILITOT 0.6 11/14/2022   ALKPHOS 76 11/14/2022   AST 32 11/14/2022   ALT 19 11/14/2022   PROT 6.1 (L) 11/14/2022   ALBUMIN 3.2 (L) 11/14/2022   CALCIUM 8.9 11/14/2022   GFRAA 108 12/26/2020    Speciality Comments: PLQ Eye Exam: 04/12/2022 WNL @ [facility] f/u 12  months.    Procedures:  No procedures performed Allergies: Betadine [povidone iodine] and Hydrocodone   Assessment / Plan:     Visit Diagnoses: Rheumatoid arthritis involving multiple sites with positive rheumatoid factor (HCC)-patient complains of having increased discomfort in her hands.  No synovitis was noted on the examination today.  No trigger fingers were noted.  She has synovial thickening over bilateral MCP joints and also incomplete extension of her fingers due to synovial thickening.  She is currently on methotrexate 4 tablets weekly along with hydroxychloroquine 200 mg Monday  to Friday.  High risk medication use - Methotrexate 4 tablets by mouth once weekly, folic acid 2 mg by mouth daily, hydroxychloroquine 200 mg  daily Monday to Friday. PLQ Eye Exam: 04/12/2022  ILD (interstitial lung disease) ([place]) - Probable early UIP related to underlying RA- High-resolution chest CT 05/23/2022: Patient states that Dr. Stephens Shire is on maternity leave.  She requests seeing a physician at [facility] clinic.  She wants to discuss findings of high-resolution CT scan and possible treatment options.  I advised her to contact the pulmonary clinic.  I  will also send a request request to pulmonary clinic for patient  Arthritis of right acromioclavicular joint-she had no discomfort range of motion of her right shoulder joint.  She had limited range of motion of her left shoulder joint.  Contracture of right elbow-unchanged without synovitis.  Primary osteoarthritis of both hands-she had bilateral IP and DIP thickening.  Joint protection muscle strengthening was discussed.  She also had synovial thickening over MCP joints with limited extension.  No synovitis was noted.  Trigger finger, right middle finger - Injected on 01/05/21 and November 29, 2021. Resolved.  No triggering was noted on the examination today.  Primary osteoarthritis of right knee-she has intermittent discomfort in her knee joints.  No warmth swelling or effusion was noted.  Trochanteric bursitis, left hip - Cortisone injection performed on 05/08/2022 has alleviated her symptoms..  She gives history of intermittent discomfort in her left trochanteric region.  Primary osteoarthritis of both feet -she continues to have some stiffness in her feet.  She has bilateral bunions and overcrowding of the toes.  Proper fitting shoes were advised.   DDD (degenerative disc disease), cervical-she had limited range of motion of the cervical spine without discomfort.  DDD (degenerative disc disease), lumbar-she continues to have some discomfort in the  lower lumbar region.  Osteoporosis screening - DEXA 02/21/20: DualFemur Neck Left 02/21/2020 76.0 -0.8 0.929 g/cm2.  Calcium rich diet was advised.  History of vitamin D deficiency-she takes vit D supplement.  History of gastroesophageal reflux (GERD)  History of hyperlipidemia  History of anxiety  History of breast cancer  Former smoker  History of asthma  History of hypertension-blood pressure was repeated which was elevated.  Repeat blood pressure was normal.  She was advised to monitor blood pressure closely.  History of ventricular tachycardia  History of left bundle branch block  Orders: No orders of the defined types were placed in this encounter.  No orders of the defined types were placed in this encounter.    Follow-Up Instructions: Return in about 3 months (around 02/27/2023) for Rheumatoid arthritis.   Bo Merino, MD  Note - This record has been created using Editor, commissioning.  Chart creation errors have been sought, but may not always  have been located. Such creation errors do not reflect on  the standard of medical care.

## 2022-11-28 ENCOUNTER — Ambulatory Visit: Payer: Medicare Other | Attending: Rheumatology | Admitting: Rheumatology

## 2022-11-28 ENCOUNTER — Encounter: Payer: Self-pay | Admitting: Rheumatology

## 2022-11-28 VITALS — BP 128/71 | HR 80 | Resp 14 | Ht 62.0 in | Wt 125.0 lb

## 2022-11-28 DIAGNOSIS — Z853 Personal history of malignant neoplasm of breast: Secondary | ICD-10-CM

## 2022-11-28 DIAGNOSIS — Z8719 Personal history of other diseases of the digestive system: Secondary | ICD-10-CM

## 2022-11-28 DIAGNOSIS — J849 Interstitial pulmonary disease, unspecified: Secondary | ICD-10-CM

## 2022-11-28 DIAGNOSIS — Z8659 Personal history of other mental and behavioral disorders: Secondary | ICD-10-CM

## 2022-11-28 DIAGNOSIS — M19011 Primary osteoarthritis, right shoulder: Secondary | ICD-10-CM

## 2022-11-28 DIAGNOSIS — M5030 Other cervical disc degeneration, unspecified cervical region: Secondary | ICD-10-CM | POA: Diagnosis not present

## 2022-11-28 DIAGNOSIS — M1711 Unilateral primary osteoarthritis, right knee: Secondary | ICD-10-CM

## 2022-11-28 DIAGNOSIS — M24521 Contracture, right elbow: Secondary | ICD-10-CM

## 2022-11-28 DIAGNOSIS — Z8709 Personal history of other diseases of the respiratory system: Secondary | ICD-10-CM

## 2022-11-28 DIAGNOSIS — Z13820 Encounter for screening for osteoporosis: Secondary | ICD-10-CM

## 2022-11-28 DIAGNOSIS — M19072 Primary osteoarthritis, left ankle and foot: Secondary | ICD-10-CM

## 2022-11-28 DIAGNOSIS — M65331 Trigger finger, right middle finger: Secondary | ICD-10-CM

## 2022-11-28 DIAGNOSIS — M19041 Primary osteoarthritis, right hand: Secondary | ICD-10-CM

## 2022-11-28 DIAGNOSIS — M19042 Primary osteoarthritis, left hand: Secondary | ICD-10-CM

## 2022-11-28 DIAGNOSIS — M7062 Trochanteric bursitis, left hip: Secondary | ICD-10-CM

## 2022-11-28 DIAGNOSIS — M5136 Other intervertebral disc degeneration, lumbar region: Secondary | ICD-10-CM

## 2022-11-28 DIAGNOSIS — M0579 Rheumatoid arthritis with rheumatoid factor of multiple sites without organ or systems involvement: Secondary | ICD-10-CM | POA: Diagnosis not present

## 2022-11-28 DIAGNOSIS — Z79899 Other long term (current) drug therapy: Secondary | ICD-10-CM

## 2022-11-28 DIAGNOSIS — Z8679 Personal history of other diseases of the circulatory system: Secondary | ICD-10-CM

## 2022-11-28 DIAGNOSIS — M19071 Primary osteoarthritis, right ankle and foot: Secondary | ICD-10-CM

## 2022-11-28 DIAGNOSIS — Z8639 Personal history of other endocrine, nutritional and metabolic disease: Secondary | ICD-10-CM

## 2022-11-28 DIAGNOSIS — Z87891 Personal history of nicotine dependence: Secondary | ICD-10-CM

## 2022-11-28 NOTE — Patient Instructions (Addendum)
Standing Labs We placed an order today for your standing lab work.   Please have your standing labs drawn in March and every 3 months  Please have your labs drawn 2 weeks prior to your appointment so that the provider can discuss your lab results at your appointment.  Please note that you may see your imaging and lab results in MyChart before we have reviewed them. We will contact you once all results are reviewed. Please allow our office up to 72 hours to thoroughly review all of the results before contacting the office for clarification of your results.  Lab hours are:   Monday through Thursday from 8:00 am -12:30 pm and 1:00 pm-5:00 pm and Friday from 8:00 am-12:00 pm.  Please be advised, all patients with office appointments requiring lab work will take precedent over walk-in lab work.   Labs are drawn by [facility]. Please bring your co-pay at the time of your lab draw.  You may receive a bill from [facility] for your lab work.  Please note if you are on Hydroxychloroquine and and an order has been placed for a Hydroxychloroquine level, you will need to have it drawn 4 hours or more after your last dose.  If you wish to have your labs drawn at another location, please call the office 24 hours in advance so we can fax the orders.  The office is located at [address], [address], [place], [place] [address] No appointment is necessary.    If you have any questions regarding directions or hours of operation,  please call [phone].   As a reminder, please drink plenty of water prior to coming for your lab work. Thanks!   Vaccines You are taking a medication(s) that can suppress your immune system.  The following immunizations are recommended: Flu annually Covid-19  RSV Td/Tdap (tetanus, diphtheria, pertussis) every 10 years Pneumonia (Prevnar 15 then Pneumovax 23 at least 1 year apart.  Alternatively, can take Prevnar 20 without needing additional dose) Shingrix: 2 doses from 4  weeks to 6 months apart  Please check with your PCP to make sure you are up to date.   If you have signs or symptoms of an infection or start antibiotics: First, call your PCP for workup of your infection. Hold your medication through the infection, until you complete your antibiotics, and until symptoms resolve if you take the following: Injectable medication (Actemra, Benlysta, Cimzia, Cosentyx, Enbrel, Humira, Kevzara, Orencia, Remicade, Simponi, Stelara, Taltz, Tremfya) Methotrexate Leflunomide (Arava) Mycophenolate (Cellcept) Xeljanz, Olumiant, or Rinvoq  

## 2022-11-29 ENCOUNTER — Encounter: Payer: Self-pay | Admitting: Rheumatology

## 2022-12-01 NOTE — Progress Notes (Signed)
Cardiology Office Note Date:  12/01/2022  Patient ID:  [name], [name] 1946/04/06, MRN [id number] PCP:  Glenis Smoker, MD  Cardiologist:  Dr. Domenic Polite Electrophysiologist: Dr. Lovena Le    Chief Complaint:  post ER  History of Present Illness: Toleen Thomas is a 77 y.o. female with history of breast ca, HTN, RA, depression, CHB, CAD/MI (PCI 07/04/16, suspect to be catheter-base thrombotic event), PPM, HTN, ICM, chronic CHF, HLD  She saw Dr. Lovena Le 10/04/22, feeling well, no changes were made.  11/14/22: ER visit with jolting type pains intermittently though persistent at her PPM site, also described as burning new that day, no other CP. Pocket reported to look stable, with no signs of infection, seroma, etc. Note reports pacer checked and functioning as expected. Discharged from the ER  TODAY She reports no ongoing symptoms/pains that she went to the ER with.  This was at the lateral edge of her device, was a twitch type feeling that was momentary but repetative.  Lasted a few hours and then none again. No unusual activities leading to the symptom, and no associated ssymptoms with it. 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  She was happy she did , but wanted to come in for further reassurance that her device is OK.  She feels well otherwise, no CP, palpitations or cardiac awareness. She lives alone, independently and care for her home with no exertional intolerances or difficulties with her ADLs No dizzy spells, near syncope or syncope. No SOB, DOE   Device information MDT CRT-P implanted 07/05/2016   Past Medical History:  Diagnosis Date   Anxiety    Asthma    Breast cancer ([place])    left breast   Carpal tunnel syndrome    Depression    Habitual alcohol use    Heart attack ([place])    Hypertension    Interstitial lung disease (HCC)    Personal history of chemotherapy    Personal history of  radiation therapy    RA (rheumatoid arthritis) ([place])    Spinal stenosis    Vision abnormalities     Past Surgical History:  Procedure Laterality Date   BREAST BIOPSY     BREAST LUMPECTOMY  2005   left   CARDIAC CATHETERIZATION N/A 07/04/2016   Procedure: Left Heart Cath and Coronary Angiography;  Surgeon: Belva Crome, MD;  Location: [facility];  Service: Cardiovascular;  Laterality: N/A;   CARDIAC CATHETERIZATION N/A 07/04/2016   Procedure: Temporary Pacemaker;  Surgeon: Belva Crome, MD;  Location: [facility];  Service: Cardiovascular;  Laterality: N/A;   CARPAL TUNNEL RELEASE     CATARACT EXTRACTION Bilateral    EP IMPLANTABLE DEVICE N/A 07/05/2016   Procedure: Pacemaker Implant;  Surgeon: Evans Lance, MD;  Location: [facility];  Service: Cardiovascular;  Laterality: N/A;   herniated rectum repair   11/2020   LAMINECTOMY  1966   LYMPHECTOMY  2005    Current Outpatient Medications  Medication Sig Dispense Refill   acetaminophen (TYLENOL) 325 MG tablet Take 325-650 mg by mouth every 6 (six) hours as needed for mild pain, headache or fever.     albuterol (PROVENTIL HFA;VENTOLIN HFA) 108 (90 Base) MCG/ACT inhaler Inhale 2 puffs into the lungs every 6 (six) hours as needed for wheezing or shortness of breath.     aspirin 81 MG tablet Take 81 mg  by mouth daily.     atorvastatin (LIPITOR) 40 MG tablet TAKE 1 TABLET(40 MG) BY MOUTH DAILY AT 6 PM 90 tablet 3   calcium carbonate (OS-CAL) 600 MG TABS Take 600 mg by mouth 2 (two) times daily with a meal.     Cholecalciferol (VITAMIN D3) 50 MCG (2000 UT) TABS Take 2,000 Units by mouth daily.     citalopram (CELEXA) 20 MG tablet Take 20 mg by mouth daily.  3   clonazePAM (KLONOPIN) 0.5 MG tablet Take 0.25-0.5 mg by mouth daily as needed for anxiety.  0   fluticasone-salmeterol (ADVAIR HFA) 115-21 MCG/ACT inhaler Inhale 2 puffs into the lungs 2 (two) times daily.     folic acid (FOLVITE) 1 MG tablet TAKE 1 TABLET BY  MOUTH DAILY 90 tablet 3   hydroxychloroquine (PLAQUENIL) 200 MG tablet TAKE 1 TABLET BY MOUTH MONDAY THROUGH FRIDAY ONLY 60 tablet 0   losartan-hydrochlorothiazide (HYZAAR) 50-12.5 MG tablet TAKE 1 TABLET BY MOUTH DAILY 90 tablet 2   melatonin 3 MG TABS tablet Take 3 mg by mouth at bedtime as needed (for sleep).     methotrexate (RHEUMATREX) 2.5 MG tablet TAKE 4 TABLETS BY MOUTH 1 TIME WEEKLY 48 tablet 0   nitroGLYCERIN (NITROSTAT) 0.4 MG SL tablet Place 1 tablet (0.4 mg total) under the tongue every 5 (five) minutes as needed for chest pain. 25 tablet 2   pantoprazole (PROTONIX) 40 MG tablet TAKE 1 TABLET(40 MG) BY MOUTH DAILY 90 tablet 3   traZODone (DESYREL) 50 MG tablet Take 50 mg by mouth at bedtime as needed for sleep.     No current facility-administered medications for this visit.    Allergies:   Betadine [povidone iodine] and Hydrocodone   Social History:  The patient  reports that she quit smoking about 29 years ago. Her smoking use included cigarettes. She has a 50.00 pack-year smoking history. She has been exposed to tobacco smoke. She has never used smokeless tobacco. She reports current alcohol use of about 14.0 standard drinks of alcohol per week. She reports that she does not use drugs.   Family History:  The patient's family history includes Asthma in her father and paternal grandfather; Cancer in her father, paternal aunt, and paternal uncle; Pulmonary fibrosis in her sister; Pyelonephritis in her sister; Stroke in her mother.  ROS:  Please see the history of present illness.    All other systems are reviewed and otherwise negative.   PHYSICAL EXAM:  VS:  There were no vitals taken for this visit. BMI: There is no height or weight on file to calculate BMI. Well nourished, well developed, in no acute distress HEENT: normocephalic, atraumatic Neck: no JVD, carotid bruits or masses Cardiac:   RRR; no significant murmurs, no rubs, or gallops Lungs:   CTA b/l, no wheezing,  rhonchi or rales Abd: soft, nontender MS: no deformity or age appropriate atrophy Ext:  no edema Skin: warm and dry, no rash Neuro:  No gross deficits appreciated Psych: euthymic mood, full affect  PPM site is stable, no tethering or discomfort, she is thin, device is prominent, though no thinning/erosion   EKG:  not done today ER EKG is reviewed: SR/ Vpaced  Device interrogation done today and reviewed by myself:  Battery and led measurements are good No arrhythmias She is dependent   02/24/22: TTE (limited) 1. Left ventricular ejection fraction, by estimation, is 55 to 60%. The  left ventricle has normal function. The left ventricle has no regional  wall motion abnormalities. Left ventricular diastolic function could not  be evaluated.   2. Right ventricular systolic function is normal. The right ventricular  size is normal. There is normal pulmonary artery systolic pressure. The  estimated right ventricular systolic pressure is 87.6 mmHg.   3. Left atrial size was mild to moderately dilated.   4. The mitral valve is degenerative. Mild mitral valve regurgitation. No  evidence of mitral stenosis. Moderate to severe mitral annular  calcification.   5. The aortic valve is tricuspid. Aortic valve regurgitation is not  visualized. No aortic stenosis is present.   6. The inferior vena cava is dilated in size with >50% respiratory  variability, suggesting right atrial pressure of 8 mmHg.    07/04/16: [facility], Dr. Tamala Julian (emergent) Conclusion    Mid Cx to Dist Cx lesion, 60 %stenosed. Mid LAD 100 % thrombotic occlusion. This was likely the result of a catheter-based thromboembolic event. Post intervention with catheter-based thrombectomy, there is a 10% residual stenosis. There is mild left ventricular systolic dysfunction. LV end diastolic pressure is normal. The left ventricular ejection fraction is 45-50% by visual estimate.   High-grade AV block with syncope and polymorphic  ventricular tachycardia (nonsustained - possibly bradycardia dependent). Heavy mitral annular calcification suggests that AV block is chronic and degenerative in nature. The patient will likely require a permanent pacemaker. Temporary pacemaker implantation via right femoral approach. Total occlusion of the mid LAD likely related to a thromboembolic event complicating catheterization. Onset of chest discomfort was temporarily related to the first left coronary angiogram. Successful catheter-based thrombectomy of the mid LAD reducing 100% stenosis to less than 10% with TIMI grade 3 flow. Stenting was not required. Otherwise widely patent/normal coronary arteries. Mid anterior wall hypokinesis with EF 40-50%. (Interestingly, the ventriculogram was performed prior to left coronary angiography).   RECOMMENDATIONS:   Aggrastat 18 hours. Aspirin and Brilinta were loaded in the cath lab. Consider permanent pacemaker tomorrow afternoon. Temporary transvenous pacemaker was secured in place and hopefully will be removed within the next 24-48 hours. Trend cardiac markers.        Echo this admission is pending    05/20/06 echocardiogram  SUMMARY  -  Overall left ventricular systolic function was normal. Left        ventricular ejection fraction was estimated , range being 55        % to 60 %. This study was inadequate for the evaluation of        left ventricular regional wall motion.     Recent Labs: 11/14/2022: ALT 19; B Natriuretic Peptide 168.2; BUN 16; Creatinine, Ser 0.63; Hemoglobin 13.1; Magnesium 1.8; Platelets 290; Potassium 4.2; Sodium 138  02/23/2022: Triglycerides 61   Estimated Creatinine Clearance: 45.8 mL/min (by C-G formula based on SCr of 0.63 mg/dL).   Wt Readings from Last 3 Encounters:  11/28/22 125 lb (56.7 kg)  11/14/22 125 lb (56.7 kg)  10/04/22 126 lb 9.6 oz (57.4 kg)     Other studies reviewed: Additional studies/records reviewed today include: summarized  above  ASSESSMENT AND PLAN:  CRT-P Intact function No programming changes made Her symptom likely musculoskeletal and d/w her   ICM Chronic CHF Recovered LVEF No symptoms or exam findings of volume OL No changes to her meds today OptiVol is way down  CAD No ischemic symptoms   HTN Looks ok  HLD Labs/management are done with her PMD    Disposition: F/u with she is comfortable with ongoing annual visits, plan for remotes  as usual, sooner in clinic if needed  Current medicines are reviewed at length with the patient today.  The patient did not have any concerns regarding medicines.  Venetia Night, PA-C 12/01/2022 4:28 PM     [address] [place] [id number] [phone] (office)  [phone] (fax)  \

## 2022-12-04 ENCOUNTER — Encounter: Payer: Self-pay | Admitting: Physician Assistant

## 2022-12-04 ENCOUNTER — Ambulatory Visit: Payer: Medicare Other | Attending: Physician Assistant | Admitting: Physician Assistant

## 2022-12-04 VITALS — BP 134/66 | HR 80 | Ht 62.0 in | Wt 123.0 lb

## 2022-12-04 DIAGNOSIS — I255 Ischemic cardiomyopathy: Secondary | ICD-10-CM

## 2022-12-04 DIAGNOSIS — I2510 Atherosclerotic heart disease of native coronary artery without angina pectoris: Secondary | ICD-10-CM | POA: Diagnosis not present

## 2022-12-04 DIAGNOSIS — I5022 Chronic systolic (congestive) heart failure: Secondary | ICD-10-CM | POA: Diagnosis not present

## 2022-12-04 DIAGNOSIS — Z950 Presence of cardiac pacemaker: Secondary | ICD-10-CM | POA: Diagnosis not present

## 2022-12-04 DIAGNOSIS — I10 Essential (primary) hypertension: Secondary | ICD-10-CM

## 2022-12-04 LAB — CUP PACEART INCLINIC DEVICE CHECK
Battery Remaining Longevity: 35 mo
Battery Voltage: 2.96 V
Brady Statistic AP VP Percent: 0.99 %
Brady Statistic AP VS Percent: 0.02 %
Brady Statistic AS VP Percent: 98.94 %
Brady Statistic AS VS Percent: 0.04 %
Brady Statistic RA Percent Paced: 1.01 %
Brady Statistic RV Percent Paced: 99.79 %
Date Time Interrogation Session: 20240117133116
Implantable Lead Connection Status: 753985
Implantable Lead Connection Status: 753985
Implantable Lead Connection Status: 753985
Implantable Lead Implant Date: 20170818
Implantable Lead Implant Date: 20170818
Implantable Lead Implant Date: 20170818
Implantable Lead Location: 753858
Implantable Lead Location: 753859
Implantable Lead Location: 753860
Implantable Lead Model: 4396
Implantable Lead Model: 5076
Implantable Lead Model: 5076
Implantable Pulse Generator Implant Date: 20170818
Lead Channel Impedance Value: 361 Ohm
Lead Channel Impedance Value: 399 Ohm
Lead Channel Impedance Value: 418 Ohm
Lead Channel Impedance Value: 456 Ohm
Lead Channel Impedance Value: 494 Ohm
Lead Channel Impedance Value: 513 Ohm
Lead Channel Impedance Value: 532 Ohm
Lead Channel Impedance Value: 646 Ohm
Lead Channel Impedance Value: 817 Ohm
Lead Channel Pacing Threshold Amplitude: 0.375 V
Lead Channel Pacing Threshold Amplitude: 0.625 V
Lead Channel Pacing Threshold Amplitude: 0.75 V
Lead Channel Pacing Threshold Pulse Width: 0.4 ms
Lead Channel Pacing Threshold Pulse Width: 0.4 ms
Lead Channel Pacing Threshold Pulse Width: 0.4 ms
Lead Channel Sensing Intrinsic Amplitude: 2.375 mV
Lead Channel Sensing Intrinsic Amplitude: 2.375 mV
Lead Channel Sensing Intrinsic Amplitude: 2.75 mV
Lead Channel Sensing Intrinsic Amplitude: 3.125 mV
Lead Channel Setting Pacing Amplitude: 1.5 V
Lead Channel Setting Pacing Amplitude: 1.75 V
Lead Channel Setting Pacing Amplitude: 2 V
Lead Channel Setting Pacing Pulse Width: 0.4 ms
Lead Channel Setting Pacing Pulse Width: 0.4 ms
Lead Channel Setting Sensing Sensitivity: 0.9 mV
Zone Setting Status: 755011
Zone Setting Status: 755011

## 2022-12-04 MED ORDER — NITROGLYCERIN 0.4 MG SL SUBL: 0.4000 mg | SUBLINGUAL_TABLET | SUBLINGUAL | 2 refills | Status: AC | PRN

## 2022-12-04 NOTE — Patient Instructions (Signed)
Medication Instructions:   Your physician recommends that you continue on your current medications as directed. Please refer to the Current Medication list given to you today.  *If you need a refill on your cardiac medications before your next appointment, please call your pharmacy*   Lab Work: NONE ORDERED  TODAY   If you have labs (blood work) drawn today and your tests are completely normal, you will receive your results only by: MyChart Message (if you have MyChart) OR A paper copy in the mail If you have any lab test that is abnormal or we need to change your treatment, we will call you to review the results.   Testing/Procedures: NONE ORDERED  TODAY     Follow-Up: At [facility], you and your health needs are our priority.  As part of our continuing mission to provide you with exceptional heart care, we have created designated Provider Care Teams.  These Care Teams include your primary Cardiologist (physician) and Advanced Practice Providers (APPs -  Physician Assistants and Nurse Practitioners) who all work together to provide you with the care you need, when you need it.  We recommend signing up for the patient portal called "MyChart".  Sign up information is provided on this After Visit Summary.  MyChart is used to connect with patients for Virtual Visits (Telemedicine).  Patients are able to view lab/test results, encounter notes, upcoming appointments, etc.  Non-urgent messages can be sent to your provider as well.   To learn more about what you can do with MyChart, go to [email or web address].    Your next appointment:   1 year(s)  Provider:   You may see Dr.Taylor or one of the following Advanced Practice Providers on your designated Care Team:   Renee Ursuy, PA-C Michael "Andy" Tillery, PA-C  Other Instructions  

## 2022-12-30 ENCOUNTER — Ambulatory Visit (INDEPENDENT_AMBULATORY_CARE_PROVIDER_SITE_OTHER): Payer: Medicare Other

## 2022-12-30 ENCOUNTER — Other Ambulatory Visit: Payer: Self-pay | Admitting: Rheumatology

## 2022-12-30 DIAGNOSIS — I442 Atrioventricular block, complete: Secondary | ICD-10-CM | POA: Diagnosis not present

## 2022-12-30 NOTE — Telephone Encounter (Signed)
Next Visit: 02/27/2023  Last Visit: 11/28/2022  Last Fill: 10/08/2022  DX: Rheumatoid arthritis involving multiple sites with positive rheumatoid factor   Current Dose per office note 11/18/2022: Methotrexate 4 tablets by mouth once weekly   Labs: 11/14/2022 Glucose 109, Total Protein 6.1, Albumin 3.2   Okay to refill MTX?

## 2023-01-03 LAB — CUP PACEART REMOTE DEVICE CHECK
Battery Remaining Longevity: 37 mo
Battery Voltage: 2.96 V
Brady Statistic AP VP Percent: 0.9 %
Brady Statistic AP VS Percent: 0.03 %
Brady Statistic AS VP Percent: 99 %
Brady Statistic AS VS Percent: 0.06 %
Brady Statistic RA Percent Paced: 0.93 %
Brady Statistic RV Percent Paced: 99.79 %
Date Time Interrogation Session: 20240216222920
Implantable Lead Connection Status: 753985
Implantable Lead Connection Status: 753985
Implantable Lead Connection Status: 753985
Implantable Lead Implant Date: 20170818
Implantable Lead Implant Date: 20170818
Implantable Lead Implant Date: 20170818
Implantable Lead Location: 753858
Implantable Lead Location: 753859
Implantable Lead Location: 753860
Implantable Lead Model: 4396
Implantable Lead Model: 5076
Implantable Lead Model: 5076
Implantable Pulse Generator Implant Date: 20170818
Lead Channel Impedance Value: 323 Ohm
Lead Channel Impedance Value: 399 Ohm
Lead Channel Impedance Value: 418 Ohm
Lead Channel Impedance Value: 437 Ohm
Lead Channel Impedance Value: 475 Ohm
Lead Channel Impedance Value: 513 Ohm
Lead Channel Impedance Value: 513 Ohm
Lead Channel Impedance Value: 627 Ohm
Lead Channel Impedance Value: 817 Ohm
Lead Channel Pacing Threshold Amplitude: 0.5 V
Lead Channel Pacing Threshold Amplitude: 0.625 V
Lead Channel Pacing Threshold Amplitude: 0.75 V
Lead Channel Pacing Threshold Pulse Width: 0.4 ms
Lead Channel Pacing Threshold Pulse Width: 0.4 ms
Lead Channel Pacing Threshold Pulse Width: 0.4 ms
Lead Channel Sensing Intrinsic Amplitude: 1.5 mV
Lead Channel Sensing Intrinsic Amplitude: 1.5 mV
Lead Channel Sensing Intrinsic Amplitude: 2.375 mV
Lead Channel Sensing Intrinsic Amplitude: 2.375 mV
Lead Channel Setting Pacing Amplitude: 1.5 V
Lead Channel Setting Pacing Amplitude: 1.75 V
Lead Channel Setting Pacing Amplitude: 2 V
Lead Channel Setting Pacing Pulse Width: 0.4 ms
Lead Channel Setting Pacing Pulse Width: 0.4 ms
Lead Channel Setting Sensing Sensitivity: 0.9 mV
Zone Setting Status: 755011
Zone Setting Status: 755011

## 2023-01-16 ENCOUNTER — Other Ambulatory Visit: Payer: Self-pay | Admitting: Physician Assistant

## 2023-01-16 DIAGNOSIS — M0579 Rheumatoid arthritis with rheumatoid factor of multiple sites without organ or systems involvement: Secondary | ICD-10-CM

## 2023-01-16 NOTE — Telephone Encounter (Signed)
Next Visit: 02/27/2023  Last Visit: 11/28/2022  Labs: 11/14/2022 Glucose 109 Total Protein 6.1 Albumin 3.2  Eye exam: 04/12/2022 WNL   Current Dose per office note 11/28/2022: hydroxychloroquine 200 mg daily Monday to Friday   [id number] arthritis involving multiple sites with positive rheumatoid factor   Last Fill: 10/28/2022  Okay to refill Plaquenil?

## 2023-01-21 ENCOUNTER — Ambulatory Visit (INDEPENDENT_AMBULATORY_CARE_PROVIDER_SITE_OTHER): Payer: Medicare Other | Admitting: Internal Medicine

## 2023-01-21 ENCOUNTER — Encounter: Payer: Self-pay | Admitting: Primary Care

## 2023-01-21 ENCOUNTER — Ambulatory Visit (INDEPENDENT_AMBULATORY_CARE_PROVIDER_SITE_OTHER): Payer: Medicare Other | Admitting: Primary Care

## 2023-01-21 VITALS — BP 114/60 | HR 83 | Temp 98.1°F | Ht 62.0 in | Wt 127.0 lb

## 2023-01-21 DIAGNOSIS — J4530 Mild persistent asthma, uncomplicated: Secondary | ICD-10-CM | POA: Diagnosis not present

## 2023-01-21 DIAGNOSIS — J849 Interstitial pulmonary disease, unspecified: Secondary | ICD-10-CM | POA: Diagnosis not present

## 2023-01-21 DIAGNOSIS — M0579 Rheumatoid arthritis with rheumatoid factor of multiple sites without organ or systems involvement: Secondary | ICD-10-CM | POA: Diagnosis not present

## 2023-01-21 NOTE — Patient Instructions (Addendum)
CT of your chest in July 2023 suggestive of interstitial lung disease  Breathing test today did show about a 10% decrease in lung function, this could be due to technique but I do recommend we get updated imaging of your lungs to ensure fibrosis has not progressed due to your rheumatoid arthritis  We will continue to monitor lung function every 6 months  Orders: Full pulmonary function testing with DLCO  Follow-up: Next months with Dr. Shearon Stalls or sooner if needed

## 2023-01-21 NOTE — Assessment & Plan Note (Addendum)
-   Breathing is stable/ no changes respiratory wise. She has baseline dyspnea with exertion and intermittent wheezing. CT chest in July 2023 suggestive of interstitial lung disease. Breathing test today did show about a 10% decrease in lung function, this could be due to technique but I do recommend we get updated chest imaging to ensure fibrosis has not progressed due to underlying rheumatoid arthritis and methotrexate use. Her sister died from pulmonary fibrosis. We will continue to monitor lung function every 6 months or as clinically indicated.   Orders: HRCT re: ILD Full pulmonary function testing with DLCO in 6 months   Follow-up: 6 months with Dr. Shearon Stalls or sooner if needed

## 2023-01-21 NOTE — Assessment & Plan Note (Addendum)
-   Stable; Not acutely exacerbated - Continue Advair two puffs twice daily  - Recommend getting full PFTs with post bronchodilator next visit

## 2023-01-21 NOTE — Progress Notes (Signed)
$'@Patient'G$  ID: [name], female    DOB: 08-14-1946, 77 y.o.   MRN: [id number]  Chief Complaint  Patient presents with   Follow-up    Review PFT today.  RA getting worse.  Breathing stable    Referring provider: Glenis Smoker, *  HPI: [name] is a 77 y.o. woman with history of tobacco use disorder, rheumatoid arthritis and shortness of breath.   08/05/22- Dr. Shearon Stalls  Here for follow up after HRCT chest. Still taking advair inhaler 2 puffs twice a day. Wheezing improved with albuterol.   She is having worsening fatigue - all the time including when she wakes up in the morning. This feels different from the shortness of breath symptoms.   RA is stable - no worsening arthralgias, rashes, fevers, chills.  Still on methotrexate and plaquenil.   No interval hospitalizations   I have reviewed the patient's family social and past medical history and updated as appropriate.    01/21/2023- interim hx  Patient presents today to for pulmonary function testing. Patient is followed by Dr. Shearon Stalls for history of tobacco use disorder, RA and shortness of breath.   Reports no changes breathing wise or worsening respiratory symptoms She does get out of breath with exertion and will experiences intermittent wheezing  She is compliant with Advair twice a day. Uses albuterol rescue inhaler once a week on average.  She is on plaquenil and methotrexate for Rheumatoid arthritis  Her RA symptoms are worse, she will be seeing rheumatology in 2-3 weeks Her sister had pulmonary fibrosis    Pulmonary testing 05/01/22 PFT>> FVC 1.93 (78%). FEV1 1.50 (82%). Ratio 77 Interpretation: Normal pulmonary function. Diffusion capacity is moderately reduced but may be underestimated for hemoglobin   01/21/2023 PFTs>> FVC 1.65 (68%), FEV1 1.28 (71%), ratio 78, DLCOcor 10.43 (59%)    Allergies  Allergen Reactions   Betadine [Povidone Iodine] Shortness Of Breath   Hydrocodone Itching     Immunization History  Administered Date(s) Administered   Fluad Quad(high Dose 65+) 08/16/2019, 08/05/2022   Influenza Split 09/10/2007, 09/05/2009, 08/05/2011, 09/15/2012, 08/12/2013   Influenza Whole 08/21/2009, 08/29/2010   Influenza, High Dose Seasonal PF 08/30/2021   Influenza,inj,Quad PF,6+ Mos 08/21/2016, 09/24/2017, 07/27/2018, 08/21/2020   Influenza,inj,quad, With Preservative 08/01/2014, 08/16/2015   PFIZER(Purple Top)SARS-COV-2 Vaccination 01/14/2020, 02/08/2020, 08/08/2020, 04/09/2021   Pneumococcal Conjugate-13 08/01/2014   Pneumococcal Polysaccharide-23 05/08/2009, 04/23/2022   Td 01/26/2018   Tdap 05/06/2007   Zoster, Live 06/16/2009    Past Medical History:  Diagnosis Date   Anxiety    Asthma    Breast cancer ([place])    left breast   Carpal tunnel syndrome    Depression    Habitual alcohol use    Heart attack ([place])    Hypertension    Interstitial lung disease ([place])    Personal history of chemotherapy    Personal history of radiation therapy    RA (rheumatoid arthritis) ([place])    Spinal stenosis    Vision abnormalities     Tobacco History: Social History   Tobacco Use  Smoking Status Former   Packs/day: 2.00   Years: 25.00   Total pack years: 50.00   Types: Cigarettes   Quit date: 1995   Years since quitting: 29.2   Passive exposure: Past  Smokeless Tobacco Never   Counseling given: Not Answered   Outpatient Medications Prior to Visit  Medication Sig Dispense Refill   acetaminophen (TYLENOL) 325 MG tablet Take 325-650 mg by mouth every 6 (six) hours  as needed for mild pain, headache or fever.     albuterol (PROVENTIL HFA;VENTOLIN HFA) 108 (90 Base) MCG/ACT inhaler Inhale 2 puffs into the lungs every 6 (six) hours as needed for wheezing or shortness of breath.     aspirin 81 MG tablet Take 81 mg by mouth daily.     atorvastatin (LIPITOR) 40 MG tablet TAKE 1 TABLET(40 MG) BY MOUTH DAILY AT 6 PM 90 tablet 3   calcium carbonate (OS-CAL) 600 MG  TABS Take 600 mg by mouth 2 (two) times daily with a meal.     Cholecalciferol (VITAMIN D3) 50 MCG (2000 UT) TABS Take 2,000 Units by mouth daily.     citalopram (CELEXA) 20 MG tablet Take 20 mg by mouth daily.  3   clonazePAM (KLONOPIN) 0.5 MG tablet Take 0.25-0.5 mg by mouth daily as needed for anxiety.  0   fluticasone-salmeterol (ADVAIR HFA) 115-21 MCG/ACT inhaler Inhale 2 puffs into the lungs 2 (two) times daily.     folic acid (FOLVITE) 1 MG tablet TAKE 1 TABLET BY MOUTH DAILY 90 tablet 3   hydroxychloroquine (PLAQUENIL) 200 MG tablet TAKE 1 TABLET BY MOUTH MONDAY THROUGH FRIDAY. 60 tablet 0   losartan-hydrochlorothiazide (HYZAAR) 50-12.5 MG tablet TAKE 1 TABLET BY MOUTH DAILY 90 tablet 2   melatonin 3 MG TABS tablet Take 3 mg by mouth at bedtime as needed (for sleep).     methotrexate (RHEUMATREX) 2.5 MG tablet TAKE 4 TABLETS BY MOUTH 1 TIME WEEKLY 48 tablet 0   nitroGLYCERIN (NITROSTAT) 0.4 MG SL tablet Place 1 tablet (0.4 mg total) under the tongue every 5 (five) minutes as needed for chest pain. 25 tablet 2   pantoprazole (PROTONIX) 40 MG tablet TAKE 1 TABLET(40 MG) BY MOUTH DAILY 90 tablet 3   traZODone (DESYREL) 50 MG tablet Take 50 mg by mouth at bedtime as needed for sleep.     budesonide-formoterol (SYMBICORT) 160-4.5 MCG/ACT inhaler Inhale 2 puffs into the lungs 2 (two) times daily. (Patient not taking: Reported on 01/21/2023)     No facility-administered medications prior to visit.    Review of Systems  Review of Systems  Constitutional: Negative.   HENT: Negative.    Respiratory:  Positive for shortness of breath and wheezing.   Cardiovascular: Negative.   Musculoskeletal:  Positive for arthralgias.     Physical Exam  BP 114/60 (BP Location: Right Arm, Patient Position: Sitting, Cuff Size: Normal)   Pulse 83   Temp 98.1 F (36.7 C) (Oral)   Ht '5\' 2"'$  (1.575 m)   Wt 127 lb (57.6 kg)   SpO2 95%   BMI 23.23 kg/m  Physical Exam Constitutional:      Appearance:  Normal appearance.  HENT:     Head: Normocephalic and atraumatic.     Mouth/Throat:     Mouth: Mucous membranes are moist.     Pharynx: Oropharynx is clear.  Cardiovascular:     Rate and Rhythm: Normal rate and regular rhythm.  Pulmonary:     Effort: Pulmonary effort is normal.     Breath sounds: Normal breath sounds.  Musculoskeletal:        General: Normal range of motion.  Skin:    General: Skin is warm and dry.  Neurological:     General: No focal deficit present.     Mental Status: She is alert and oriented to person, place, and time. Mental status is at baseline.  Psychiatric:        Behavior: Behavior normal.  Thought Content: Thought content normal.        Judgment: Judgment normal.      Lab Results:  CBC    Component Value Date/Time   WBC 8.5 11/14/2022 2017   RBC 4.03 11/14/2022 2017   HGB 13.1 11/14/2022 2017   HGB 13.8 11/01/2021 1057   HGB 14.2 09/05/2011 1332   HCT 38.0 11/14/2022 2017   HCT 41.0 11/01/2021 1057   HCT 40.8 09/05/2011 1332   PLT 290 11/14/2022 2017   PLT 284 11/01/2021 1057   MCV 94.3 11/14/2022 2017   MCV 95 11/01/2021 1057   MCV 91.5 09/05/2011 1332   MCH 32.5 11/14/2022 2017   MCHC 34.5 11/14/2022 2017   RDW 14.3 11/14/2022 2017   RDW 12.3 11/01/2021 1057   RDW 13.6 09/05/2011 1332   LYMPHSABS 1.8 11/14/2022 2017   LYMPHSABS 1.7 11/01/2021 1057   LYMPHSABS 1.3 09/05/2011 1332   MONOABS 1.0 11/14/2022 2017   MONOABS 0.6 09/05/2011 1332   EOSABS 0.4 11/14/2022 2017   EOSABS 0.4 11/01/2021 1057   BASOSABS 0.1 11/14/2022 2017   BASOSABS 0.1 11/01/2021 1057   BASOSABS 0.0 09/05/2011 1332    BMET    Component Value Date/Time   NA 138 11/14/2022 2017   NA 139 11/01/2021 1057   K 4.2 11/14/2022 2017   CL 104 11/14/2022 2017   CO2 26 11/14/2022 2017   GLUCOSE 109 (H) 11/14/2022 2017   BUN 16 11/14/2022 2017   BUN 15 11/01/2021 1057   CREATININE 0.63 11/14/2022 2017   CREATININE 0.49 (L) 08/07/2022 1058   CALCIUM  8.9 11/14/2022 2017   GFRNONAA >60 11/14/2022 2017   GFRNONAA 93 12/26/2020 1056   GFRAA 108 12/26/2020 1056    BNP    Component Value Date/Time   BNP 168.2 (H) 11/14/2022 2017    ProBNP No results found for: "PROBNP"  Imaging: CUP PACEART REMOTE DEVICE CHECK  Result Date: 01/03/2023 Scheduled remote reviewed. Normal device function.  There was one short NSVT and one short atrial fib event that was 3 minutes and 4 seconds Next remote 91 days. Kathy Breach, RN, CCDS, [employer] Scheduled remote reviewed. Normal device function.  There was one short NSVT and one short atrial fib event that was 3 minutes and 4 seconds Next remote 91 days. Kathy Breach, RN, CCDS, [employer]    Assessment & Plan:   ILD (interstitial lung disease) ([place]) - Breathing is stable/ no changes respiratory wise. She has baseline dyspnea with exertion and intermittent wheezing. CT chest in July 2023 suggestive of interstitial lung disease. Breathing test today did show about a 10% decrease in lung function, this could be due to technique but I do recommend we get updated chest imaging to ensure fibrosis has not progressed due to underlying rheumatoid arthritis and methotrexate use. Her sister died from pulmonary fibrosis. We will continue to monitor lung function every 6 months or as clinically indicated.   Orders: HRCT re: ILD Full pulmonary function testing with DLCO in 6 months   Follow-up: 6 months with Dr. Shearon Stalls or sooner if needed  Asthma - Stable; Not acutely exacerbated - Continue Advair two puffs twice daily  - Recommend getting full PFTs with post bronchodilator next visit   Rheumatoid arthritis involving multiple sites with positive rheumatoid factor (HCC) - Worsening RA symptoms. Maintained on Methotrexate and Plaquenil. Following with rheumatology.   Brandy Ehrich, NP 01/27/2023

## 2023-01-21 NOTE — Progress Notes (Signed)
Spiro/DLCO performed today.  

## 2023-01-21 NOTE — Patient Instructions (Signed)
Spiro/DLCO performed today.  

## 2023-01-27 NOTE — Assessment & Plan Note (Signed)
-   Worsening RA symptoms. Maintained on Methotrexate and Plaquenil. Following with rheumatology.

## 2023-01-29 LAB — PULMONARY FUNCTION TEST
DL/VA % pred: 88 %
DL/VA: 3.66 ml/min/mmHg/L
DLCO cor % pred: 59 %
DLCO cor: 10.53 ml/min/mmHg
DLCO unc % pred: 59 %
DLCO unc: 10.43 ml/min/mmHg
FEF 25-75 Pre: 1.12 L/sec
FEF2575-%Pred-Pre: 82 %
FEV1-%Pred-Pre: 71 %
FEV1-Pre: 1.28 L
FEV1FVC-%Pred-Pre: 105 %
FEV6-%Pred-Pre: 70 %
FEV6-Pre: 1.6 L
FEV6FVC-%Pred-Pre: 104 %
FVC-%Pred-Pre: 68 %
FVC-Pre: 1.65 L
Pre FEV1/FVC ratio: 78 %
Pre FEV6/FVC Ratio: 98 %

## 2023-02-06 ENCOUNTER — Other Ambulatory Visit: Payer: Self-pay | Admitting: Family Medicine

## 2023-02-06 DIAGNOSIS — M069 Rheumatoid arthritis, unspecified: Secondary | ICD-10-CM | POA: Diagnosis not present

## 2023-02-06 DIAGNOSIS — N644 Mastodynia: Secondary | ICD-10-CM | POA: Diagnosis not present

## 2023-02-06 DIAGNOSIS — N63 Unspecified lump in unspecified breast: Secondary | ICD-10-CM

## 2023-02-06 DIAGNOSIS — N6320 Unspecified lump in the left breast, unspecified quadrant: Secondary | ICD-10-CM | POA: Diagnosis not present

## 2023-02-07 ENCOUNTER — Ambulatory Visit (HOSPITAL_COMMUNITY)
Admission: RE | Admit: 2023-02-07 | Discharge: 2023-02-07 | Disposition: A | Discharge status: Home / Self Care | Payer: Medicare Other | Source: Ambulatory Visit | Attending: Primary Care | Admitting: Primary Care

## 2023-02-07 DIAGNOSIS — M0579 Rheumatoid arthritis with rheumatoid factor of multiple sites without organ or systems involvement: Secondary | ICD-10-CM | POA: Insufficient documentation

## 2023-02-07 DIAGNOSIS — J849 Interstitial pulmonary disease, unspecified: Secondary | ICD-10-CM | POA: Insufficient documentation

## 2023-02-07 DIAGNOSIS — J432 Centrilobular emphysema: Secondary | ICD-10-CM | POA: Diagnosis not present

## 2023-02-07 DIAGNOSIS — J479 Bronchiectasis, uncomplicated: Secondary | ICD-10-CM | POA: Diagnosis not present

## 2023-02-13 ENCOUNTER — Ambulatory Visit
Admission: RE | Admit: 2023-02-13 | Discharge: 2023-02-13 | Disposition: A | Discharge status: Home / Self Care | Payer: Medicare Other | Source: Ambulatory Visit | Attending: Family Medicine | Admitting: Family Medicine

## 2023-02-13 DIAGNOSIS — N63 Unspecified lump in unspecified breast: Secondary | ICD-10-CM

## 2023-02-13 DIAGNOSIS — Z853 Personal history of malignant neoplasm of breast: Secondary | ICD-10-CM | POA: Diagnosis not present

## 2023-02-13 DIAGNOSIS — R922 Inconclusive mammogram: Secondary | ICD-10-CM | POA: Diagnosis not present

## 2023-02-13 DIAGNOSIS — N644 Mastodynia: Secondary | ICD-10-CM | POA: Diagnosis not present

## 2023-02-13 NOTE — Progress Notes (Unsigned)
Office Visit Note  Patient: [name]             Date of Birth: February 16, 1946           MRN: [id number]             PCP: Shon Hale, MD Referring: Shon Hale, * Visit Date: 02/27/2023 Occupation: @  Subjective:  Discuss medication options   History of Present Illness: [name] is a 77 y.o. female with history of seropositive rheumatoid arthritis and ILD. Patient is taking Methotrexate 4 tablets by mouth once weekly, folic acid 2 mg by mouth daily, hydroxychloroquine 200 mg daily Monday to Friday.  She denies missing any doses recently.  She has been tolerating combination therapy without any side effects.  Patient reports that she had an updated high-resolution chest CT performed on 02/07/2023.  She was evaluated by her pulmonologist Dr. Celine Mans yesterday.   She has been experiencing worsening symptoms of rheumatoid arthritis recently.  Her pain has been most severe in both hands but she has had intermittent sharp pains in her feet as well.  She has been taking Tylenol as needed for symptomatic relief.   Activities of Daily Living:  Patient reports morning stiffness for a few minutes  Patient Reports nocturnal pain.  Difficulty dressing/grooming: Denies Difficulty climbing stairs: Reports Difficulty getting out of chair: Reports Difficulty using hands for taps, buttons, cutlery, and/or writing: Reports  Review of Systems  Constitutional:  Positive for fatigue.  HENT:  Positive for mouth dryness. Negative for mouth sores.   Eyes:  Negative for dryness.  Respiratory:  Negative for shortness of breath.   Cardiovascular:  Negative for chest pain and palpitations.  Gastrointestinal:  Negative for blood in stool, constipation and diarrhea.  Endocrine: Negative for increased urination.  Genitourinary:  Negative for involuntary urination.  Musculoskeletal:  Positive for joint pain, gait problem, joint pain, joint swelling, myalgias, muscle weakness,  morning stiffness, muscle tenderness and myalgias.  Skin:  Negative for color change, rash, hair loss and sensitivity to sunlight.  Allergic/Immunologic: Negative for susceptible to infections.  Neurological:  Positive for dizziness. Negative for headaches.  Hematological:  Negative for swollen glands.  Psychiatric/Behavioral:  Negative for depressed mood and sleep disturbance. The patient is nervous/anxious.     PMFS History:  Patient Active Problem List   Diagnosis Date Noted   ILD (interstitial lung disease) 01/21/2023   Acute respiratory failure with hypoxia 02/24/2022   GERD (gastroesophageal reflux disease) 02/24/2022   Elevated troponin 02/24/2022   Bilateral carpal tunnel syndrome 04/14/2018   Cervical spinal stenosis 01/07/2018   Cervical radiculopathy 01/07/2018   Stenosis of left subclavian artery 12/15/2017   History of gastroesophageal reflux (GERD) 06/25/2017   History of bilateral carpal tunnel release 06/25/2017   Former smoker 01/25/2017   Rheumatoid arthritis involving multiple sites with positive rheumatoid factor 01/17/2017   High risk medication use 01/17/2017   Primary osteoarthritis of both hands 01/17/2017   Primary osteoarthritis of both feet 01/17/2017   DJD (degenerative joint disease), cervical 01/17/2017   Spondylosis of lumbar region without myelopathy or radiculopathy 01/17/2017   Cardiomyopathy, ischemic 07/07/2016   CAD- S/P LAD thrombectomy 07/04/16 07/07/2016   Pacemaker-MDT BiV placed 07/05/16 07/07/2016   History of rheumatoid arthritis 07/07/2016   Complete heart block 07/04/2016   Essential hypertension 07/04/2016   LBBB (left bundle branch block) 07/04/2016   Exostosis 06/18/2011   History of breast cancer 02/16/2010   Vitamin D deficiency 02/15/2010   HYPERCHOLESTEROLEMIA  02/15/2010   Anxiety state 02/15/2010   Asthma 02/15/2010   MIGRAINES, HX OF 02/15/2010   MASTECTOMY, LEFT, HX OF 02/15/2010    Past Medical History:  Diagnosis  Date   Anxiety    Asthma    Breast cancer    left breast   Carpal tunnel syndrome    Depression    Habitual alcohol use    Heart attack    Hypertension    Interstitial lung disease    Personal history of chemotherapy    Personal history of radiation therapy    RA (rheumatoid arthritis)    Spinal stenosis    Vision abnormalities     Family History  Problem Relation Age of Onset   Stroke Mother    Cancer Father        colon?   Asthma Father    Pyelonephritis Sister    Pulmonary fibrosis Sister    Asthma Paternal Grandfather    Cancer Paternal Aunt    Cancer Paternal Uncle    Past Surgical History:  Procedure Laterality Date   BREAST BIOPSY     BREAST LUMPECTOMY  2005   left   CARDIAC CATHETERIZATION N/A 07/04/2016   Procedure: Left Heart Cath and Coronary Angiography;  Surgeon: Lyn Records, MD;  Location: [facility] INVASIVE CV LAB;  Service: Cardiovascular;  Laterality: N/A;   CARDIAC CATHETERIZATION N/A 07/04/2016   Procedure: Temporary Pacemaker;  Surgeon: Lyn Records, MD;  Location: [facility] INVASIVE CV LAB;  Service: Cardiovascular;  Laterality: N/A;   CARPAL TUNNEL RELEASE     CATARACT EXTRACTION Bilateral    EP IMPLANTABLE DEVICE N/A 07/05/2016   Procedure: Pacemaker Implant;  Surgeon: Marinus Maw, MD;  Location: [facility] INVASIVE CV LAB;  Service: Cardiovascular;  Laterality: N/A;   herniated rectum repair   11/2020   LAMINECTOMY  1966   LYMPHECTOMY  2005   Social History   Social History Narrative   Not on file   Immunization History  Administered Date(s) Administered   Fluad Quad(high Dose 65+) 08/16/2019, 08/05/2022   Influenza Split 09/10/2007, 09/05/2009, 08/05/2011, 09/15/2012, 08/12/2013   Influenza Whole 08/21/2009, 08/29/2010   Influenza, High Dose Seasonal PF 08/30/2021   Influenza,inj,Quad PF,6+ Mos 08/21/2016, 09/24/2017, 07/27/2018, 08/21/2020   Influenza,inj,quad, With Preservative 08/01/2014, 08/16/2015   PFIZER(Purple Top)SARS-COV-2 Vaccination  01/14/2020, 02/08/2020, 08/08/2020, 04/09/2021   Pneumococcal Conjugate-13 08/01/2014   Pneumococcal Polysaccharide-23 05/08/2009, 04/23/2022   Td 01/26/2018   Tdap 05/06/2007   Zoster, Live 06/16/2009     Objective: Vital Signs: BP 119/61 (BP Location: Right Arm, Patient Position: Sitting, Cuff Size: Normal)   Pulse 76   Resp 14   Ht 5\' 2"  (1.575 m)   Wt 126 lb 3.2 oz (57.2 kg)   BMI 23.08 kg/m    Physical Exam Vitals and nursing note reviewed.  Constitutional:      Appearance: She is well-developed.  HENT:     Head: Normocephalic and atraumatic.  Eyes:     Conjunctiva/sclera: Conjunctivae normal.  Cardiovascular:     Rate and Rhythm: Normal rate and regular rhythm.     Heart sounds: Normal heart sounds.  Pulmonary:     Effort: Pulmonary effort is normal.     Breath sounds: Normal breath sounds.  Abdominal:     General: Bowel sounds are normal.     Palpations: Abdomen is soft.  Musculoskeletal:     Cervical back: Normal range of motion.  Lymphadenopathy:     Cervical: No cervical adenopathy.  Skin:  General: Skin is warm and dry.     Capillary Refill: Capillary refill takes less than 2 seconds.  Neurological:     Mental Status: She is alert and oriented to person, place, and time.  Psychiatric:        Behavior: Behavior normal.      Musculoskeletal Exam: C-spine has limited range of motion with lateral rotation.  Thoracic kyphosis noted.  Discomfort with lumbar range of motion.  Shoulder joint abduction to about 110 degrees bilaterally.  Right elbow joint contractures unchanged.  Thickening of MCP joints with some tenderness over bilateral second and third MCP joints.  PIP and DIP thickening noted.  Hip joints have slightly limited range of motion.  Knee joints have good range of motion with no warmth or effusion.  Ankle joints have good range of motion with no tenderness or joint swelling.  CDAI Exam: CDAI Score: 6  Patient Global: 5 mm; Provider Global: 5  mm Swollen: 0 ; Tender: 6  Joint Exam 02/27/2023      Right  Left  Glenohumeral   Tender     MCP 2   Tender   Tender  MCP 3   Tender   Tender  Ankle   Tender        Investigation: No additional findings.  Imaging: MM 3D DIAGNOSTIC MAMMOGRAM BILATERAL BREAST  Addendum Date: 02/19/2023   ADDENDUM REPORT: 02/19/2023 08:49 ADDENDUM: The exam title should include the following: ULTRASOUND LEFT BREAST LIMITED Electronically Signed   By: Beckie SaltsSteven  Reid M.D.   On: 02/19/2023 08:49   Result Date: 02/19/2023 CLINICAL DATA:  Swelling and marked tenderness superior to the patient's left lumpectomy scar for the past 10 days. Status post left lumpectomy and radiation therapy for breast cancer in 2005. EXAM: DIGITAL DIAGNOSTIC BILATERAL MAMMOGRAM WITH TOMOSYNTHESIS TECHNIQUE: Bilateral digital diagnostic mammography and breast tomosynthesis was performed. COMPARISON:  Previous exam(s). ACR Breast Density Category c: The breasts are heterogeneously dense, which may obscure small masses. FINDINGS: Stable post lumpectomy changes on the left. No interval findings suspicious for malignancy in either breast. On physical exam, no mass is palpable in the upper left breast. The patient is markedly tender to palpation throughout that region of the breast. She has a tunneled pacemaker superior to the lateral aspect of the breast. Targeted ultrasound is performed, showing normal appearing breast tissue throughout the upper left breast in the area of patient concern and tenderness. There is a densely calcified vessel coursing through the upper inner aspect of the breast, simulating a pacer wire. A densely calcified vessel is confirmed mammographically and on the chest CT dated 02/07/2023. IMPRESSION: No evidence of malignancy. RECOMMENDATION: Bilateral screening mammogram in 1 year. I have discussed the findings and recommendations with the patient. If applicable, a reminder letter will be sent to the patient regarding the next  appointment. BI-RADS CATEGORY  2: Benign. Electronically Signed: By: Beckie SaltsSteven  Reid M.D. On: 02/13/2023 16:23   US LIMITED ULTRASOUND INCLUDING AXILLA LEFT BREAST   Addendum Date: 02/19/2023   ADDENDUM REPORT: 02/19/2023 08:42 ADDENDUM: The exam title should include the following: ULTRASOUND LEFT BREAST LIMITED Electronically Signed   By: Beckie SaltsSteven  Reid M.D.   On: 02/19/2023 08:42   Result Date: 02/19/2023 CLINICAL DATA:  Swelling and marked tenderness superior to the patient's left lumpectomy scar for the past 10 days. Status post left lumpectomy and radiation therapy for breast cancer in 2005. EXAM: DIGITAL DIAGNOSTIC BILATERAL MAMMOGRAM WITH TOMOSYNTHESIS TECHNIQUE: Bilateral digital diagnostic mammography and breast tomosynthesis was  performed. COMPARISON:  Previous exam(s). ACR Breast Density Category c: The breasts are heterogeneously dense, which may obscure small masses. FINDINGS: Stable post lumpectomy changes on the left. No interval findings suspicious for malignancy in either breast. On physical exam, no mass is palpable in the upper left breast. The patient is markedly tender to palpation throughout that region of the breast. She has a tunneled pacemaker superior to the lateral aspect of the breast. Targeted ultrasound is performed, showing normal appearing breast tissue throughout the upper left breast in the area of patient concern and tenderness. There is a densely calcified vessel coursing through the upper inner aspect of the breast, simulating a pacer wire. A densely calcified vessel is confirmed mammographically and on the chest CT dated 02/07/2023. IMPRESSION: No evidence of malignancy. RECOMMENDATION: Bilateral screening mammogram in 1 year. I have discussed the findings and recommendations with the patient. If applicable, a reminder letter will be sent to the patient regarding the next appointment. BI-RADS CATEGORY  2: Benign. Electronically Signed: By: Beckie Salts M.D. On: 02/14/2023 08:07    CT CHEST HIGH RESOLUTION  Result Date: 02/10/2023 CLINICAL DATA:  Shortness of breath and wheezing. EXAM: CT CHEST WITHOUT CONTRAST TECHNIQUE: Multidetector CT imaging of the chest was performed following the standard protocol without intravenous contrast. High resolution imaging of the lungs, as well as inspiratory and expiratory imaging, was performed. RADIATION DOSE REDUCTION: This exam was performed according to the departmental dose-optimization program which includes automated exposure control, adjustment of the mA and/or kV according to patient size and/or use of iterative reconstruction technique. COMPARISON:  05/23/2022, 02/24/2022. FINDINGS: Cardiovascular: Atherosclerotic calcification of the aorta, aortic valve and coronary arteries. Heart is at the upper limits of normal in size to mildly enlarged. No pericardial effusion. Dense mitral annulus calcification. Mediastinum/Nodes: Calcified mediastinal and hilar lymph nodes. No pathologically enlarged mediastinal or axillary lymph nodes. Hilar regions are difficult to definitively evaluate without IV contrast. Surgical clips in the left axilla. Air in the esophagus can be seen with dysmotility. Lungs/Pleura: Centrilobular and paraseptal emphysema. Subpleural reticulation and ground-glass with traction bronchiolectasis and no definite zonal predominance. Findings are likely stable from baseline 02/24/2022 although respiratory motion on that study is complicating. Questionable single layer honeycombing in the posterior right lower lobe. Subpleural radiation scarring and volume loss in the left upper lobe. No pleural fluid. Airway is unremarkable. No air trapping. Upper Abdomen: Probable cyst in the right hepatic lobe. Visualized portions of the liver, gallbladder, adrenal glands and right kidney are otherwise unremarkable. Subcentimeter low-attenuation lesion in the left kidney. No specific follow-up necessary. Left renal stones. Visualized portions of  the spleen, pancreas, stomach and bowel are grossly unremarkable. No upper abdominal adenopathy. Musculoskeletal: Degenerative changes in the spine. Minimal retrolisthesis of T12 on L1 and L1 on L2. Old sternal fracture. No worrisome lytic or sclerotic lesions. IMPRESSION: 1. Mild subpleural fibrosis without a definite zonal predominance and questionable single layer honeycombing. Findings are categorized as probable UIP per consensus guidelines: Diagnosis of Idiopathic Pulmonary Fibrosis: An Official ATS/ERS/JRS/ALAT Clinical Practice Guideline. Am Rosezetta Schlatter Crit Care Med Vol 198, Iss 5, [phone], Jul 19 2017. 2. Left renal stones. 3. Aortic atherosclerosis (ICD10-I70.0). Coronary artery calcification. 4.  Emphysema (ICD10-J43.9). Electronically Signed   By: Leanna Battles M.D.   On: 02/10/2023 14:08    Recent Labs: Lab Results  Component Value Date   WBC 8.5 11/14/2022   HGB 13.1 11/14/2022   PLT 290 11/14/2022   NA 138 11/14/2022   K  4.2 11/14/2022   CL 104 11/14/2022   CO2 26 11/14/2022   GLUCOSE 109 (H) 11/14/2022   BUN 16 11/14/2022   CREATININE 0.63 11/14/2022   BILITOT 0.6 11/14/2022   ALKPHOS 76 11/14/2022   AST 32 11/14/2022   ALT 19 11/14/2022   PROT 6.1 (L) 11/14/2022   ALBUMIN 3.2 (L) 11/14/2022   CALCIUM 8.9 11/14/2022   GFRAA 108 12/26/2020    Speciality Comments: PLQ Eye Exam: 04/12/2022 WNL @ [facility] f/u 12  months.    Procedures:  No procedures performed Allergies: Betadine [povidone iodine] and Hydrocodone    Assessment / Plan:     Visit Diagnoses: Rheumatoid arthritis involving multiple sites with positive rheumatoid factor: Patient has been experiencing more frequent flares of rheumatoid arthritis involving her hands and both feet.  She has been taking methotrexate 4 tablets by mouth once weekly, folic acid 1 mg daily, and Plaquenil 200 mg 1 tablet daily Monday through Friday.  She has been tolerating combination therapy without any side effects.   She has been having to take Tylenol more frequently for breakthrough symptoms.  She was evaluated by her pulmonologist Dr. Celine Mans yesterday at which time there was discussion of increasing the dose of methotrexate or discussing other treatment options to better manage her rheumatoid arthritis.  Other treatment options discussed by Dr. Celine Mans including CellCept, Imuran, and rituximab.  She is apprehensive to change any medications drastically at this time.  Plan on trying to increase methotrexate to 6 tablets by mouth once weekly and to remain on Plaquenil as prescribed.  She was in agreement.  She will notify us if she cannot tolerate taking methotrexate.  We also discussed the option of switching from oral to injectable methotrexate to improve efficacy but she would like to hold off at this time.  CBC and CMP were updated today.  If labs are stable she will increase methotrexate to 6 tablets weekly.  She will have updated lab work in 1 month and every 3 months.  She will follow-up in the office in 8 weeks or sooner if needed.  High risk medication use - Methotrexate 6 tablets by mouth once weekly, folic acid 1 mg by mouth daily, and hydroxychloroquine 200 mg  daily Monday to Friday.  Plan to increase the dose of methotrexate to 6 tablets by mouth once weekly.  She will remain on the current dose of Plaquenil as prescribed. Encouraged patient to try to avoid the use of Tylenol and NSAIDs while taking methotrexate concurrently. CBC and CMP updated on 11/14/22. Orders for CBC and CMP released today.  Patient will require updated lab work in 1 month and every 3 months. PLQ Eye Exam: 04/12/2022 WNL @ [facility] f/u 12 months.  She is scheduled to updated Plaquenil eye examination in June 2024.  - Plan: CBC with Differential/Platelet, COMPLETE METABOLIC PANEL WITH GFR, Hydroxychloroquine, Blood  ILD (interstitial lung disease) - Probable early UIP related to underlying RA- High-resolution chest CT  05/23/2022.   Patient was evaluated at [facility] on 01/21/23-reviewed office visit note today: Dyspnea with exertion and intermittent wheezing. CT chest in July 2023 suggestive of interstitial lung disease. 10% decrease in lung function.  Recommended updating CT: 02/07/23: Mild subpleural fibrosis without a definite zonal predominance and questionable single layer honeycombing. Findings are categorized as probable UIP per consensus guidelines: Diagnosis of Idiopathic Pulmonary Fibrosis. Patient was evaluated by Dr. Celine Mans yesterday and the office visit note was reviewed today-there was discussion of remaining on methotrexate  and Plaquenil from a pulmonary standpoint and she was okay with increasing the dose of methotrexate if needed.  There was also discussion of possibly changing to CellCept, Imuran, and rituximab in the future if needed.  The patient is apprehensive to make any drastic medication changes at this time.  The plan is for her to increase methotrexate to 6 tablets by mouth once weekly as discussed above.  Arthritis of right acromioclavicular joint: Limited abduction to about 110 degrees.  Contracture of right elbow: Unchanged.  No tenderness or inflammation noted.  Primary osteoarthritis of both hands: She has PIP and DIP thickening consistent with osteoarthritis of both hands.  She is been experiencing increased pain and stiffness in both hands.  She has also had intermittent inflammation concerning for frequent rheumatoid arthritis flares.  She is been having to take Tylenol more frequently for breakthrough symptoms.  As discussed above plan on increasing the dose of methotrexate to 6 tablets once weekly.  If her symptoms persist or worsen she will notify us.  Trigger finger, right middle finger - Injected on 01/05/21 and November 29, 2021  Primary osteoarthritis of right knee: Good range of motion of the right knee joint on examination today.  No warmth or effusion noted.  Trochanteric  bursitis, left hip: Intermittently symptomatic.  Primary osteoarthritis of both feet: She has been experiencing increased pain in both feet.  DDD (degenerative disc disease), cervical: Limited ROM with lateral rotation.   DDD (degenerative disc disease), lumbar: Intermittent discomfort.    Other medical conditions are listed as follows:  Osteoporosis screening - - DEXA 02/21/20: DualFemur Neck Left 02/21/2020 76.0 -0.8 0.929 g/cm2.  She remains on calcium and vitamin D supplement daily.  History of vitamin D deficiency: She is taking vitamin D 2000 units daily.  History of gastroesophageal reflux (GERD)  History of hyperlipidemia  History of anxiety  History of breast cancer  Former smoker  History of asthma  History of hypertension: Blood pressure was 119/61 today in the office.  History of ventricular tachycardia  History of left bundle branch block   Orders: Orders Placed This Encounter  Procedures   CBC with Differential/Platelet   COMPLETE METABOLIC PANEL WITH GFR   Hydroxychloroquine, Blood   No orders of the defined types were placed in this encounter.     Follow-Up Instructions: Return in about 8 weeks (around 04/24/2023) for Rheumatoid arthritis, ILD.   Gearldine Bienenstock, PA-C  Note - This record has been created using Dragon software.  Chart creation errors have been sought, but may not always  have been located. Such creation errors do not reflect on  the standard of medical care.

## 2023-02-13 NOTE — Progress Notes (Signed)
Remote pacemaker transmission.   

## 2023-02-26 ENCOUNTER — Ambulatory Visit: Payer: Medicare Other | Admitting: Internal Medicine

## 2023-02-26 ENCOUNTER — Encounter: Payer: Self-pay | Admitting: Internal Medicine

## 2023-02-26 VITALS — BP 114/52 | HR 82 | Temp 98.2°F | Ht 62.0 in | Wt 127.0 lb

## 2023-02-26 DIAGNOSIS — M0579 Rheumatoid arthritis with rheumatoid factor of multiple sites without organ or systems involvement: Secondary | ICD-10-CM

## 2023-02-26 DIAGNOSIS — J849 Interstitial pulmonary disease, unspecified: Secondary | ICD-10-CM

## 2023-02-26 NOTE — Patient Instructions (Addendum)
Please schedule follow up scheduled with myself in 4 months.  If my schedule is not open yet, we will contact you with a reminder closer to that time. Please call [phone] if you haven't heard from [place] a month before.   Lung function and imaging is stable.  Continue advair and albuterol inhaler. I will reach out to Dr. Corliss Skains about your rheumatoid arthritis and next steps. Call me sooner if change in your breathing.

## 2023-02-26 NOTE — Progress Notes (Signed)
Kielyn Dudziak    [id number]    22-Mar-1946  Primary Care Physician:Timberlake, Meridee Score, MD Date of Appointment: 02/26/2023 Established Patient Visit  Chief complaint:   Chief Complaint  Patient presents with   Follow-up    Ct results      HPI: Tayiah Amirian is a 77 y.o. woman with history of tobacco use disorder, rheumatoid arthritis and shortness of breath.   Interval Updates: Here for follow up for RA-ILD, likely UIP.   Having worsening dyspnea, wheezing improved by albuterol.   Her RA is not doing well. She is having progression of symptoms despite being on methotrexate and plaquenil, with hands and shoulder involvement. Plan is to see Dr. Corliss Skains tomorrow. Taking tylenol for pain.       I have reviewed the patient's family social and past medical history and updated as appropriate.   Past Medical History:  Diagnosis Date   Anxiety    Asthma    Breast cancer    left breast   Carpal tunnel syndrome    Depression    Habitual alcohol use    Heart attack    Hypertension    Interstitial lung disease    Personal history of chemotherapy    Personal history of radiation therapy    RA (rheumatoid arthritis)    Spinal stenosis    Vision abnormalities     Past Surgical History:  Procedure Laterality Date   BREAST BIOPSY     BREAST LUMPECTOMY  2005   left   CARDIAC CATHETERIZATION N/A 07/04/2016   Procedure: Left Heart Cath and Coronary Angiography;  Surgeon: Lyn Records, MD;  Location: [facility] INVASIVE CV LAB;  Service: Cardiovascular;  Laterality: N/A;   CARDIAC CATHETERIZATION N/A 07/04/2016   Procedure: Temporary Pacemaker;  Surgeon: Lyn Records, MD;  Location: [facility] INVASIVE CV LAB;  Service: Cardiovascular;  Laterality: N/A;   CARPAL TUNNEL RELEASE     CATARACT EXTRACTION Bilateral    EP IMPLANTABLE DEVICE N/A 07/05/2016   Procedure: Pacemaker Implant;  Surgeon: Marinus Maw, MD;  Location: [facility] INVASIVE CV LAB;  Service: Cardiovascular;   Laterality: N/A;   herniated rectum repair   11/2020   LAMINECTOMY  1966   LYMPHECTOMY  2005    Family History  Problem Relation Age of Onset   Stroke Mother    Cancer Father        colon?   Asthma Father    Pyelonephritis Sister    Pulmonary fibrosis Sister    Asthma Paternal Grandfather    Cancer Paternal Aunt    Cancer Paternal Uncle     Social History   Occupational History   Not on file  Tobacco Use   Smoking status: Former    Packs/day: 2.00    Years: 25.00    Additional pack years: 0.00    Total pack years: 50.00    Types: Cigarettes    Quit date: 1995    Years since quitting: 29.2    Passive exposure: Past   Smokeless tobacco: Never  Vaping Use   Vaping Use: Never used  Substance and Sexual Activity   Alcohol use: Yes    Alcohol/week: 14.0 standard drinks of alcohol    Types: 14 Cans of beer per week    Comment: 2 DAILY   Drug use: Never   Sexual activity: Not on file     Physical Exam: Blood pressure (!) 114/52, pulse 82, temperature 98.2 F (36.8 C), temperature  source Oral, height 5\' 2"  (1.575 m), weight 127 lb (57.6 kg), SpO2 95 %.  Gen:      NAD Lungs:    ctab no wheezes or crackles CV:         RRR Ext: MCP joint synovitis, swan hand deformity   Data Reviewed: Imaging: I have personally reviewed the CT scan Hi Res from July 2023 which shows peripheral reticular markings with traction bronchiectasis in an apical basal gradient. There is no ground glass. These findings are consistent with UIP.   PFTs:     Latest Ref Rng & Units 01/21/2023    2:03 PM 05/01/2022   12:05 PM  PFT Results  FVC-Pre L 1.65  1.88   FVC-Predicted Pre % 68  76   FVC-Post L  1.93   FVC-Predicted Post %  78   Pre FEV1/FVC % % 78  77   Post FEV1/FCV % %  77   FEV1-Pre L 1.28  1.45   FEV1-Predicted Pre % 71  79   FEV1-Post L  1.50   DLCO uncorrected ml/min/mmHg 10.43  10.33   DLCO UNC% % 59  58   DLCO corrected ml/min/mmHg 10.53  10.33   DLCO COR %Predicted %  59  58   DLVA Predicted % 88  75   TLC L  4.21   TLC % Predicted %  88   RV % Predicted %  98    I have personally reviewed the patient's PFTs and normal spirometry and lung volumes, mildly reduced diffusion capacity.   Labs: Lab Results  Component Value Date   WBC 8.5 11/14/2022   HGB 13.1 11/14/2022   HCT 38.0 11/14/2022   MCV 94.3 11/14/2022   PLT 290 11/14/2022   Lab Results  Component Value Date   NA 138 11/14/2022   K 4.2 11/14/2022   CL 104 11/14/2022   CO2 26 11/14/2022    Immunization status: Immunization History  Administered Date(s) Administered   Fluad Quad(high Dose 65+) 08/16/2019, 08/05/2022   Influenza Split 09/10/2007, 09/05/2009, 08/05/2011, 09/15/2012, 08/12/2013   Influenza Whole 08/21/2009, 08/29/2010   Influenza, High Dose Seasonal PF 08/30/2021   Influenza,inj,Quad PF,6+ Mos 08/21/2016, 09/24/2017, 07/27/2018, 08/21/2020   Influenza,inj,quad, With Preservative 08/01/2014, 08/16/2015   PFIZER(Purple Top)SARS-COV-2 Vaccination 01/14/2020, 02/08/2020, 08/08/2020, 04/09/2021   Pneumococcal Conjugate-13 08/01/2014   Pneumococcal Polysaccharide-23 05/08/2009, 04/23/2022   Td 01/26/2018   Tdap 05/06/2007   Zoster, Live 06/16/2009    External Records Personally Reviewed: ED, PCP  Assessment:  ILD - probable early UIP related to underlying Rheumatoid Arthritis with airway involvement. History of tobacco use disorder Left hemidiaphragm eventration Rheumatoid arthritis Encounter for high risk drug monitoring.   Plan/Recommendations:  Lung function is slightly reduced but still within the normal range and not reduced to be clinically significant.  CT Chest shows stable ILD.   She probably has some mild airway but cannot exclude airway involvement of her ILD that is being treated with advair twice daily with prn albuterol which we should continue.   Labs reviewed Dec 2023 LFTs stable - ok on methotrexate.   Unfortunately joint symptoms are  worsening - I am comfortable with keeping her on mtx and plaquenil from a pulmonary perspective, and if dose needs to be increased that is ok with me. However if we are changing medications, cell cept, imuran, and rituxumab all have evidence to be efficacious in RA-ILD.   Return to Care: Return in about 4 months (around 06/28/2023).   Durel Salts,  MD Pulmonary and Critical Care Medicine [facility] [phone]

## 2023-02-27 ENCOUNTER — Ambulatory Visit: Payer: Medicare Other | Attending: Physician Assistant | Admitting: Physician Assistant

## 2023-02-27 ENCOUNTER — Encounter: Payer: Self-pay | Admitting: Physician Assistant

## 2023-02-27 ENCOUNTER — Telehealth: Payer: Self-pay

## 2023-02-27 VITALS — BP 119/61 | HR 76 | Resp 14 | Ht 62.0 in | Wt 126.2 lb

## 2023-02-27 DIAGNOSIS — M5136 Other intervertebral disc degeneration, lumbar region: Secondary | ICD-10-CM | POA: Diagnosis not present

## 2023-02-27 DIAGNOSIS — M0579 Rheumatoid arthritis with rheumatoid factor of multiple sites without organ or systems involvement: Secondary | ICD-10-CM

## 2023-02-27 DIAGNOSIS — J849 Interstitial pulmonary disease, unspecified: Secondary | ICD-10-CM

## 2023-02-27 DIAGNOSIS — Z13820 Encounter for screening for osteoporosis: Secondary | ICD-10-CM

## 2023-02-27 DIAGNOSIS — M24521 Contracture, right elbow: Secondary | ICD-10-CM | POA: Diagnosis not present

## 2023-02-27 DIAGNOSIS — M5030 Other cervical disc degeneration, unspecified cervical region: Secondary | ICD-10-CM

## 2023-02-27 DIAGNOSIS — M19041 Primary osteoarthritis, right hand: Secondary | ICD-10-CM

## 2023-02-27 DIAGNOSIS — M19071 Primary osteoarthritis, right ankle and foot: Secondary | ICD-10-CM

## 2023-02-27 DIAGNOSIS — M19072 Primary osteoarthritis, left ankle and foot: Secondary | ICD-10-CM

## 2023-02-27 DIAGNOSIS — M1711 Unilateral primary osteoarthritis, right knee: Secondary | ICD-10-CM

## 2023-02-27 DIAGNOSIS — Z8659 Personal history of other mental and behavioral disorders: Secondary | ICD-10-CM

## 2023-02-27 DIAGNOSIS — Z87891 Personal history of nicotine dependence: Secondary | ICD-10-CM

## 2023-02-27 DIAGNOSIS — Z8719 Personal history of other diseases of the digestive system: Secondary | ICD-10-CM

## 2023-02-27 DIAGNOSIS — M19011 Primary osteoarthritis, right shoulder: Secondary | ICD-10-CM | POA: Diagnosis not present

## 2023-02-27 DIAGNOSIS — Z853 Personal history of malignant neoplasm of breast: Secondary | ICD-10-CM

## 2023-02-27 DIAGNOSIS — M19042 Primary osteoarthritis, left hand: Secondary | ICD-10-CM

## 2023-02-27 DIAGNOSIS — Z79899 Other long term (current) drug therapy: Secondary | ICD-10-CM

## 2023-02-27 DIAGNOSIS — M65331 Trigger finger, right middle finger: Secondary | ICD-10-CM

## 2023-02-27 DIAGNOSIS — Z8709 Personal history of other diseases of the respiratory system: Secondary | ICD-10-CM

## 2023-02-27 DIAGNOSIS — Z8639 Personal history of other endocrine, nutritional and metabolic disease: Secondary | ICD-10-CM

## 2023-02-27 DIAGNOSIS — M7062 Trochanteric bursitis, left hip: Secondary | ICD-10-CM | POA: Diagnosis not present

## 2023-02-27 DIAGNOSIS — Z8679 Personal history of other diseases of the circulatory system: Secondary | ICD-10-CM

## 2023-02-27 NOTE — Patient Instructions (Addendum)
Standing Labs We placed an order today for your standing lab work.   Please have your standing labs drawn in 1 month and every 3 months   Please have your labs drawn 2 weeks prior to your appointment so that the provider can discuss your lab results at your appointment, if possible.  Please note that you may see your imaging and lab results in MyChart before we have reviewed them. We will contact you once all results are reviewed. Please allow our office up to 72 hours to thoroughly review all of the results before contacting the office for clarification of your results.  WALK-IN LAB HOURS  Monday through Thursday from 8:00 am -12:30 pm and 1:00 pm-5:00 pm and Friday from 8:00 am-12:00 pm.  Patients with office visits requiring labs will be seen before walk-in labs.  You may encounter longer than normal wait times. Please allow additional time. Wait times may be shorter on  Monday and Thursday afternoons.  We do not book appointments for walk-in labs. We appreciate your patience and understanding with our staff.   Labs are drawn by [facility]. Please bring your co-pay at the time of your lab draw.  You may receive a bill from [facility] for your lab work.  Please note if you are on Hydroxychloroquine and and an order has been placed for a Hydroxychloroquine level,  you will need to have it drawn 4 hours or more after your last dose.  If you wish to have your labs drawn at another location, please call the office 24 hours in advance so we can fax the orders.  The office is located at [address], [address], [place], [place] [address]   If you have any questions regarding directions or hours of operation,  please call [phone].   As a reminder, please drink plenty of water prior to coming for your lab work. Thanks!  

## 2023-02-27 NOTE — Telephone Encounter (Signed)
Per Sherron Ales, PA-C, pending lab results please place new rx as a no print for methotrexate 6 tablets weekly. Thanks!

## 2023-02-28 ENCOUNTER — Other Ambulatory Visit: Payer: Self-pay

## 2023-02-28 DIAGNOSIS — R7989 Other specified abnormal findings of blood chemistry: Secondary | ICD-10-CM

## 2023-02-28 NOTE — Telephone Encounter (Signed)
See lab note from 02/28/2023 for details.

## 2023-02-28 NOTE — Progress Notes (Unsigned)
Please review and sign future lab order.

## 2023-02-28 NOTE — Progress Notes (Signed)
CBC Wnl   AST is borderline elevated-36.ALT WNL.   Rest of CMP Wnl.   Reviewed lab work with Dr. Corliss Skains.   Recommend holding tylenol.  Recheck hepatic function panel in 2 weeks if liver enzymes have improved--we can discuss increasing the dose of MTX at that time.

## 2023-03-04 ENCOUNTER — Other Ambulatory Visit: Payer: Self-pay | Admitting: *Deleted

## 2023-03-05 ENCOUNTER — Ambulatory Visit: Payer: Medicare Other | Attending: Family Medicine | Admitting: Occupational Therapy

## 2023-03-05 DIAGNOSIS — M25542 Pain in joints of left hand: Secondary | ICD-10-CM | POA: Insufficient documentation

## 2023-03-05 DIAGNOSIS — M25512 Pain in left shoulder: Secondary | ICD-10-CM | POA: Insufficient documentation

## 2023-03-05 DIAGNOSIS — M25541 Pain in joints of right hand: Secondary | ICD-10-CM | POA: Diagnosis not present

## 2023-03-05 DIAGNOSIS — M25511 Pain in right shoulder: Secondary | ICD-10-CM | POA: Insufficient documentation

## 2023-03-05 NOTE — Therapy (Signed)
OUTPATIENT OCCUPATIONAL THERAPY NEURO EVALUATION  Patient Name: [name] MRN: [id number] DOB:Jan 05, 1946, 77 y.o., female Today's Date: 03/05/2023  PCP: Shon Hale, MD REFERRING PROVIDER: Shon Hale, MD  END OF SESSION:  OT End of Session - 03/05/23 1211     Visit Number 1    Number of Visits 6    Date for OT Re-Evaluation 05/02/23    Authorization Type [employer] Medicare    OT Start Time 1150    OT Stop Time 1230    OT Time Calculation (min) 40 min    Activity Tolerance Patient tolerated treatment well             Past Medical History:  Diagnosis Date   Anxiety    Asthma    Breast cancer    left breast   Carpal tunnel syndrome    Depression    Habitual alcohol use    Heart attack    Hypertension    Interstitial lung disease    Personal history of chemotherapy    Personal history of radiation therapy    RA (rheumatoid arthritis)    Spinal stenosis    Vision abnormalities    Past Surgical History:  Procedure Laterality Date   BREAST BIOPSY     BREAST LUMPECTOMY  2005   left   CARDIAC CATHETERIZATION N/A 07/04/2016   Procedure: Left Heart Cath and Coronary Angiography;  Surgeon: Lyn Records, MD;  Location: [facility] INVASIVE CV LAB;  Service: Cardiovascular;  Laterality: N/A;   CARDIAC CATHETERIZATION N/A 07/04/2016   Procedure: Temporary Pacemaker;  Surgeon: Lyn Records, MD;  Location: [facility] INVASIVE CV LAB;  Service: Cardiovascular;  Laterality: N/A;   CARPAL TUNNEL RELEASE     CATARACT EXTRACTION Bilateral    EP IMPLANTABLE DEVICE N/A 07/05/2016   Procedure: Pacemaker Implant;  Surgeon: Marinus Maw, MD;  Location: [facility] INVASIVE CV LAB;  Service: Cardiovascular;  Laterality: N/A;   herniated rectum repair   11/2020   LAMINECTOMY  1966   LYMPHECTOMY  2005   Patient Active Problem List   Diagnosis Date Noted   ILD (interstitial lung disease) 01/21/2023   Acute respiratory failure with hypoxia 02/24/2022   GERD (gastroesophageal reflux  disease) 02/24/2022   Elevated troponin 02/24/2022   Bilateral carpal tunnel syndrome 04/14/2018   Cervical spinal stenosis 01/07/2018   Cervical radiculopathy 01/07/2018   Stenosis of left subclavian artery 12/15/2017   History of gastroesophageal reflux (GERD) 06/25/2017   History of bilateral carpal tunnel release 06/25/2017   Former smoker 01/25/2017   Rheumatoid arthritis involving multiple sites with positive rheumatoid factor 01/17/2017   High risk medication use 01/17/2017   Primary osteoarthritis of both hands 01/17/2017   Primary osteoarthritis of both feet 01/17/2017   DJD (degenerative joint disease), cervical 01/17/2017   Spondylosis of lumbar region without myelopathy or radiculopathy 01/17/2017   Cardiomyopathy, ischemic 07/07/2016   CAD- S/P LAD thrombectomy 07/04/16 07/07/2016   Pacemaker-MDT BiV placed 07/05/16 07/07/2016   History of rheumatoid arthritis 07/07/2016   Complete heart block 07/04/2016   Essential hypertension 07/04/2016   LBBB (left bundle branch block) 07/04/2016   Exostosis 06/18/2011   History of breast cancer 02/16/2010   Vitamin D deficiency 02/15/2010   HYPERCHOLESTEROLEMIA 02/15/2010   Anxiety state 02/15/2010   Asthma 02/15/2010   MIGRAINES, HX OF 02/15/2010   MASTECTOMY, LEFT, HX OF 02/15/2010    ONSET DATE: referral date 02/12/23  REFERRING DIAG: M06.9 (ICD-10-CM) - Rheumatoid arthritis, unspecified  THERAPY DIAG:  Pain  in joint of left hand  Pain in joint of right hand  Left shoulder pain, unspecified chronicity  Right shoulder pain, unspecified chronicity  Rationale for Evaluation and Treatment: Rehabilitation  SUBJECTIVE:   SUBJECTIVE STATEMENT: "I have constant pain in my hands." Pt reports pain in hands and shoulders due to rheumatoid arthritis. Pt reports attempts to wear compression gloves, however too difficult to don small size therefore wearing medium which does not provide appropriate compression. Pt accompanied by:  self  PERTINENT HISTORY: RA, ILD  PRECAUTIONS: None  WEIGHT BEARING RESTRICTIONS: No  PAIN:  Are you having pain? Yes: NPRS scale: 6/10 Pain location: hands and shoulders Pain description: sharp, shooting, burning Aggravating factors: using hands Relieving factors: not doing anything  FALLS: Has patient fallen in last 6 months? No  LIVING ENVIRONMENT: Lives with: lives alone Lives in: Other townhouse Stairs: Yes: Internal: full flights steps; can reach both and External: 2 steps; none Has following equipment at home:  utilizing adaptive tools for opening jars/bottles, scissors, needle nose plyers  PLOF: Independent and Independent with basic ADLs  PATIENT GOALS: pain reduction  OBJECTIVE:   HAND DOMINANCE: Right  ADLs: Transfers/ambulation related to ADLs: Independent Eating: difficulty with opening containers (tabs and jars) Grooming: difficulty with styling hair due to pain in shoulder with holding hair dryer UB Dressing: difficulty with pulling shirt over head, buttons are extremely difficult LB Dressing: Mod I  Toileting: Mod I Bathing: difficulty with washing back Tub Shower transfers: Mod I with grab bars Equipment: Grab bars  MOBILITY STATUS: Independent  POSTURE COMMENTS:  rounded shoulders  FUNCTIONAL OUTCOME MEASURES: Upper Extremity Functional Scale (UEFS): 35/80 = 44% function  UPPER EXTREMITY ROM:    Active ROM Right eval Left eval  Shoulder flexion 142 108  Shoulder abduction    Shoulder adduction    Shoulder extension    Shoulder internal rotation    Shoulder external rotation    Elbow flexion    Elbow extension -14 WNL  Wrist flexion    Wrist extension    Wrist ulnar deviation    Wrist radial deviation    Wrist pronation    Wrist supination    (Blank rows = not tested)  COORDINATION: 9 Hole Peg test: Right: 31.56 sec; Left: 32.16 sec  SENSATION: Reports occasional numbness in hands based on position when  sleeping  COGNITION: Overall cognitive status: Within functional limits for tasks assessed    TODAY'S TREATMENT:                                                                          03/05/23 Brief introduction to use of AE to increase ease with aspects of bathing/dressing to allow for increased function with other tasks.  Discussed modifications to grasp positioning to reduce impact/strain on joints.  Plan to further instruct during future sessions.  PATIENT EDUCATION: Education details: Educated on role and purpose of OT as well as potential interventions and goals for therapy based on initial evaluation findings. Person educated: Patient Education method: Explanation Education comprehension: verbalized understanding and needs further education  HOME EXERCISE PROGRAM: TBD   GOALS: Goals reviewed with patient? Yes  SHORT TERM GOALS: Target date: 04/04/23  Pt will be independent in gentle  ROM exercises for pain management. Baseline: Goal status: INITIAL  2.  Pt will verbalize understanding of use of modalities to decrease inflammation/pain.  Baseline:  Goal status: INITIAL  3.  Pt will verbalize understanding of task modifications and/or potential AE needs to increase ease, safety, and independence with IADLS.  Baseline:  Goal status: INITIAL   LONG TERM GOALS: Target date: 05/02/23  Pt will verbalize and/or demonstrate ability to don shirt with decreased pain with use of adaptive strategies. Baseline:  Goal status: INITIAL  2.  Pt will demonstrate improved ease to manage clothing fasteners as demonstrated by decreased time to button/unbutton 3 buttons by 10 seconds. Baseline: TBD Goal status: INITIAL  3.  Pt will report decreased pain in R hand/index finger during activities requiring grip to less than 5 on pain scale.  Baseline:  Goal status: INITIAL  4.  Pt will report improved functional use of BUE as evidenced by improved score on UEFS to >/= to  40/80. Baseline: 35/80 Goal status: INITIAL   ASSESSMENT:  CLINICAL IMPRESSION: Patient is a 77 y.o. female who was seen today for occupational therapy evaluation for B hand pain due to RA. Pt reports limitations in functional tasks due to RA pain and stiffness.  Pt has made some modifications and utilizes tools to increase ability to complete tasks as able.  Pt currently lives alone and has adult children in the area that will assist with heavier/more challenging tasks.  Pt will benefit from skilled occupational therapy services to address strength and coordination, ROM, pain management, altered sensation, introduction of compensatory strategies/AE prn, and implementation of an HEP to improve participation and safety during ADLs and IADLs.   PERFORMANCE DEFICITS: in functional skills including ADLs, IADLs, ROM, strength, pain, flexibility, Fine motor control, decreased knowledge of precautions, decreased knowledge of use of DME, and UE functional use and psychosocial skills including environmental adaptation.   IMPAIRMENTS: are limiting patient from ADLs and IADLs.   CO-MORBIDITIES: may have co-morbidities  that affects occupational performance. Patient will benefit from skilled OT to address above impairments and improve overall function.  MODIFICATION OR ASSISTANCE TO COMPLETE EVALUATION: Min-Moderate modification of tasks or assist with assess necessary to complete an evaluation.  OT OCCUPATIONAL PROFILE AND HISTORY: Problem focused assessment: Including review of records relating to presenting problem.  CLINICAL DECISION MAKING: LOW - limited treatment options, no task modification necessary  REHAB POTENTIAL: Good  EVALUATION COMPLEXITY: Low    PLAN:  OT FREQUENCY: 1x/week  OT DURATION: 8 weeks (to accommodate scheduling)  PLANNED INTERVENTIONS: self care/ADL training, therapeutic exercise, therapeutic activity, manual therapy, passive range of motion, splinting, ultrasound,  paraffin, fluidotherapy, compression bandaging, moist heat, cryotherapy, patient/family education, psychosocial skills training, and DME and/or AE instructions  RECOMMENDED OTHER SERVICES: NA  CONSULTED AND AGREED WITH PLAN OF CARE: Patient  PLAN FOR NEXT SESSION: heat prior to movement, educate on tendon gliding, initiate education on modifications of grasp and/or AE to aid in ADLs/IADLs   [place], Maralyn Sago, OT 03/05/2023, 12:53 PM

## 2023-03-07 LAB — COMPLETE METABOLIC PANEL WITH GFR
AG Ratio: 1.5 (calc) (ref 1.0–2.5)
ALT: 26 U/L (ref 6–29)
AST: 36 U/L — ABNORMAL HIGH (ref 10–35)
Albumin: 3.8 g/dL (ref 3.6–5.1)
Alkaline phosphatase (APISO): 61 U/L (ref 37–153)
BUN: 21 mg/dL (ref 7–25)
CO2: 29 mmol/L (ref 20–32)
Calcium: 9.4 mg/dL (ref 8.6–10.4)
Chloride: 99 mmol/L (ref 98–110)
Creat: 0.66 mg/dL (ref 0.60–1.00)
Globulin: 2.6 g/dL (calc) (ref 1.9–3.7)
Glucose, Bld: 95 mg/dL (ref 65–99)
Potassium: 4.1 mmol/L (ref 3.5–5.3)
Sodium: 137 mmol/L (ref 135–146)
Total Bilirubin: 0.6 mg/dL (ref 0.2–1.2)
Total Protein: 6.4 g/dL (ref 6.1–8.1)
eGFR: 89 mL/min/{1.73_m2} (ref >=60)

## 2023-03-07 LAB — CBC WITH DIFFERENTIAL/PLATELET
Absolute Monocytes: 837 cells/uL (ref 200–950)
Basophils Absolute: 109 cells/uL (ref 0–200)
Basophils Relative: 1.2 %
Eosinophils Absolute: 400 cells/uL (ref 15–500)
Eosinophils Relative: 4.4 %
HCT: 40.3 % (ref 35.0–45.0)
Hemoglobin: 13.5 g/dL (ref 11.7–15.5)
Lymphs Abs: 1966 cells/uL (ref 850–3900)
MCH: 32.2 pg (ref 27.0–33.0)
MCHC: 33.5 g/dL (ref 32.0–36.0)
MCV: 96.2 fL (ref 80.0–100.0)
MPV: 9.2 fL (ref 7.5–12.5)
Monocytes Relative: 9.2 %
Neutro Abs: 5788 cells/uL (ref 1500–7800)
Neutrophils Relative %: 63.6 %
Platelets: 308 10*3/uL (ref 140–400)
RBC: 4.19 10*6/uL (ref 3.80–5.10)
RDW: 13.1 % (ref 11.0–15.0)
Total Lymphocyte: 21.6 %
WBC: 9.1 10*3/uL (ref 3.8–10.8)

## 2023-03-07 LAB — HYDROXYCHLOROQUINE,BLOOD: HYDROXYCHLOROQUINE, (B): 980 ng/mL — ABNORMAL HIGH

## 2023-03-10 ENCOUNTER — Telehealth: Payer: Self-pay

## 2023-03-10 NOTE — Progress Notes (Signed)
Plaquenil is within a therapeutic range

## 2023-03-10 NOTE — Telephone Encounter (Signed)
Patient called the office about lab results. Advised patient Plaquenil is within a therapeutic range. Patient verbalized understanding.

## 2023-03-11 ENCOUNTER — Other Ambulatory Visit: Payer: Self-pay

## 2023-03-11 MED ORDER — ATORVASTATIN CALCIUM 40 MG PO TABS: ORAL_TABLET | ORAL | 0 refills | Status: DC

## 2023-03-11 NOTE — Telephone Encounter (Signed)
Pt's medication was sent to pt's pharmacy as requested. Confirmation received.  °

## 2023-03-13 ENCOUNTER — Ambulatory Visit: Payer: Medicare Other | Admitting: Occupational Therapy

## 2023-03-13 ENCOUNTER — Other Ambulatory Visit: Payer: Self-pay | Admitting: *Deleted

## 2023-03-13 DIAGNOSIS — M25512 Pain in left shoulder: Secondary | ICD-10-CM | POA: Diagnosis not present

## 2023-03-13 DIAGNOSIS — R7989 Other specified abnormal findings of blood chemistry: Secondary | ICD-10-CM

## 2023-03-13 DIAGNOSIS — M25542 Pain in joints of left hand: Secondary | ICD-10-CM | POA: Diagnosis not present

## 2023-03-13 DIAGNOSIS — M25541 Pain in joints of right hand: Secondary | ICD-10-CM

## 2023-03-13 DIAGNOSIS — M25511 Pain in right shoulder: Secondary | ICD-10-CM

## 2023-03-13 NOTE — Therapy (Signed)
OUTPATIENT OCCUPATIONAL THERAPY NEURO EVALUATION  Patient Name: [name] MRN: [id number] DOB:[birth year], 77 y.o., female Today's Date: 03/13/2023  PCP: Shon Hale, MD REFERRING PROVIDER: Shon Hale, MD  END OF SESSION:  OT End of Session - 03/13/23 1246     Visit Number 2    Number of Visits 6    Date for OT Re-Evaluation 05/02/23    Authorization Type [employer] Medicare    OT Start Time 1147    OT Stop Time 1231    OT Time Calculation (min) 44 min    Activity Tolerance Patient tolerated treatment well              Past Medical History:  Diagnosis Date   Anxiety    Asthma    Breast cancer    left breast   Carpal tunnel syndrome    Depression    Habitual alcohol use    Heart attack    Hypertension    Interstitial lung disease    Personal history of chemotherapy    Personal history of radiation therapy    RA (rheumatoid arthritis)    Spinal stenosis    Vision abnormalities    Past Surgical History:  Procedure Laterality Date   BREAST BIOPSY     BREAST LUMPECTOMY  2005   left   CARDIAC CATHETERIZATION N/A 07/04/2016   Procedure: Left Heart Cath and Coronary Angiography;  Surgeon: Lyn Records, MD;  Location: [facility] INVASIVE CV LAB;  Service: Cardiovascular;  Laterality: N/A;   CARDIAC CATHETERIZATION N/A 07/04/2016   Procedure: Temporary Pacemaker;  Surgeon: Lyn Records, MD;  Location: [facility] INVASIVE CV LAB;  Service: Cardiovascular;  Laterality: N/A;   CARPAL TUNNEL RELEASE     CATARACT EXTRACTION Bilateral    EP IMPLANTABLE DEVICE N/A 07/05/2016   Procedure: Pacemaker Implant;  Surgeon: Marinus Maw, MD;  Location: [facility] INVASIVE CV LAB;  Service: Cardiovascular;  Laterality: N/A;   herniated rectum repair   11/2020   LAMINECTOMY  1966   LYMPHECTOMY  2005   Patient Active Problem List   Diagnosis Date Noted   ILD (interstitial lung disease) 01/21/2023   Acute respiratory failure with hypoxia 02/24/2022   GERD (gastroesophageal reflux  disease) 02/24/2022   Elevated troponin 02/24/2022   Bilateral carpal tunnel syndrome 04/14/2018   Cervical spinal stenosis 01/07/2018   Cervical radiculopathy 01/07/2018   Stenosis of left subclavian artery 12/15/2017   History of gastroesophageal reflux (GERD) 06/25/2017   History of bilateral carpal tunnel release 06/25/2017   Former smoker 01/25/2017   Rheumatoid arthritis involving multiple sites with positive rheumatoid factor 01/17/2017   High risk medication use 01/17/2017   Primary osteoarthritis of both hands 01/17/2017   Primary osteoarthritis of both feet 01/17/2017   DJD (degenerative joint disease), cervical 01/17/2017   Spondylosis of lumbar region without myelopathy or radiculopathy 01/17/2017   Cardiomyopathy, ischemic 07/07/2016   CAD- S/P LAD thrombectomy 07/04/16 07/07/2016   Pacemaker-MDT BiV placed 07/05/16 07/07/2016   History of rheumatoid arthritis 07/07/2016   Complete heart block 07/04/2016   Essential hypertension 07/04/2016   LBBB (left bundle branch block) 07/04/2016   Exostosis 06/18/2011   History of breast cancer 02/16/2010   Vitamin D deficiency 02/15/2010   HYPERCHOLESTEROLEMIA 02/15/2010   Anxiety state 02/15/2010   Asthma 02/15/2010   MIGRAINES, HX OF 02/15/2010   MASTECTOMY, LEFT, HX OF 02/15/2010    ONSET DATE: referral date 02/12/23  REFERRING DIAG: M06.9 (ICD-10-CM) - Rheumatoid arthritis, unspecified  THERAPY DIAG:  Pain in joint of left hand  Pain in joint of right hand  Left shoulder pain, unspecified chronicity  Right shoulder pain, unspecified chronicity  Rationale for Evaluation and Treatment: Rehabilitation  SUBJECTIVE:   SUBJECTIVE STATEMENT: Pt reports no changes, but that's a good thing.  Pt reports increased pain in palmar aspect of index finger and hand.   Pt accompanied by: self  PERTINENT HISTORY: RA, ILD  PRECAUTIONS: None  WEIGHT BEARING RESTRICTIONS: No  PAIN:  Are you having pain? Yes: NPRS scale:  6/10 Pain location: hands and shoulders Pain description: sharp, shooting, burning Aggravating factors: using hands Relieving factors: not doing anything  FALLS: Has patient fallen in last 6 months? No  LIVING ENVIRONMENT: Lives with: lives alone Lives in: Other townhouse Stairs: Yes: Internal: full flights steps; can reach both and External: 2 steps; none Has following equipment at home:  utilizing adaptive tools for opening jars/bottles, scissors, needle nose plyers  PLOF: Independent and Independent with basic ADLs  PATIENT GOALS: pain reduction  OBJECTIVE:   HAND DOMINANCE: Right  ADLs: Transfers/ambulation related to ADLs: Independent Eating: difficulty with opening containers (tabs and jars) Grooming: difficulty with styling hair due to pain in shoulder with holding hair dryer UB Dressing: difficulty with pulling shirt over head, buttons are extremely difficult LB Dressing: Mod I  Toileting: Mod I Bathing: difficulty with washing back Tub Shower transfers: Mod I with grab bars Equipment: Grab bars  MOBILITY STATUS: Independent  POSTURE COMMENTS:  rounded shoulders  FUNCTIONAL OUTCOME MEASURES: Upper Extremity Functional Scale (UEFS): 35/80 = 44% function  UPPER EXTREMITY ROM:    Active ROM Right eval Left eval  Shoulder flexion 142 108  Shoulder abduction    Shoulder adduction    Shoulder extension    Shoulder internal rotation    Shoulder external rotation    Elbow flexion    Elbow extension -14 WNL  Wrist flexion    Wrist extension    Wrist ulnar deviation    Wrist radial deviation    Wrist pronation    Wrist supination    (Blank rows = not tested)  COORDINATION: 9 Hole Peg test: Right: 31.56 sec; Left: 32.16 sec  SENSATION: Reports occasional numbness in hands based on position when sleeping  COGNITION: Overall cognitive status: Within functional limits for tasks assessed    TODAY'S TREATMENT:                                                                           03/13/23 Massage: OT providing retrograde massage to R index finger and into palm to decrease edema and pain in finger and palm.  Pt with initial reports of pain in index finger but tolerating massage while OT educating on additional pain relief strategies. Pain:  OT educated on moist heat and provided pictures and suggestions for pt to purchase as needed. AE/strategies: educated on alternative tools to aid in cutting foods (use of pizza cutter, salad sheers, etc), use of electric toothbrush for increased grasp and decreased effort with brushing, and began looking for adaptive key holder to aid in pt concerns with decreased ability to turn key in car ignition due to pain.  Provided pt with printed resources of items to consider  and alternative handling strategies to decrease joint pressure (from OT toolkit)   03/05/23 Brief introduction to use of AE to increase ease with aspects of bathing/dressing to allow for increased function with other tasks.  Discussed modifications to grasp positioning to reduce impact/strain on joints.  Plan to further instruct during future sessions.  PATIENT EDUCATION: Education details: Educated on role and purpose of OT as well as potential interventions and goals for therapy based on initial evaluation findings. Person educated: Patient Education method: Explanation Education comprehension: verbalized understanding and needs further education  HOME EXERCISE PROGRAM: TBD   GOALS: Goals reviewed with patient? Yes  SHORT TERM GOALS: Target date: 04/04/23  Pt will be independent in gentle ROM exercises for pain management. Baseline: Goal status: IN PROGRESS  2.  Pt will verbalize understanding of use of modalities to decrease inflammation/pain.  Baseline:  Goal status: IN PROGRESS  3.  Pt will verbalize understanding of task modifications and/or potential AE needs to increase ease, safety, and independence with IADLS.  Baseline:   Goal status: IN PROGRESS   LONG TERM GOALS: Target date: 05/02/23  Pt will verbalize and/or demonstrate ability to don shirt with decreased pain with use of adaptive strategies. Baseline:  Goal status: IN PROGRESS  2.  Pt will demonstrate improved ease to manage clothing fasteners as demonstrated by decreased time to button/unbutton 3 buttons by 10 seconds. Baseline: TBD Goal status: IN PROGRESS  3.  Pt will report decreased pain in R hand/index finger during activities requiring grip to less than 5 on pain scale.  Baseline:  Goal status: IN PROGRESS  4.  Pt will report improved functional use of BUE as evidenced by improved score on UEFS to >/= to 40/80. Baseline: 35/80 Goal status: IN PROGRESS   ASSESSMENT:  CLINICAL IMPRESSION: Pt brought in a list of tasks that she is having challenges with at home, pt reports difficulty with turning key in the car ignition, writing, pulling shirts over head, and reaching to retreive items from shelf in the kitchen.  OT providing education on heat and massage for pain relief, encouraging pt to purchase moist heat pack - educating on rationale for moist heat and heat over cold.  Provided pt with resources for alternative tools as well as grasps/positioning to decrease strain on joints.  PERFORMANCE DEFICITS: in functional skills including ADLs, IADLs, ROM, strength, pain, flexibility, Fine motor control, decreased knowledge of precautions, decreased knowledge of use of DME, and UE functional use and psychosocial skills including environmental adaptation.   IMPAIRMENTS: are limiting patient from ADLs and IADLs.   CO-MORBIDITIES: may have co-morbidities  that affects occupational performance. Patient will benefit from skilled OT to address above impairments and improve overall function.  MODIFICATION OR ASSISTANCE TO COMPLETE EVALUATION: Min-Moderate modification of tasks or assist with assess necessary to complete an evaluation.  OT OCCUPATIONAL  PROFILE AND HISTORY: Problem focused assessment: Including review of records relating to presenting problem.  CLINICAL DECISION MAKING: LOW - limited treatment options, no task modification necessary  REHAB POTENTIAL: Good  EVALUATION COMPLEXITY: Low    PLAN:  OT FREQUENCY: 1x/week  OT DURATION: 8 weeks (to accommodate scheduling)  PLANNED INTERVENTIONS: self care/ADL training, therapeutic exercise, therapeutic activity, manual therapy, passive range of motion, splinting, ultrasound, paraffin, fluidotherapy, compression bandaging, moist heat, cryotherapy, patient/family education, psychosocial skills training, and DME and/or AE instructions  RECOMMENDED OTHER SERVICES: NA  CONSULTED AND AGREED WITH PLAN OF CARE: Patient  PLAN FOR NEXT SESSION: heat prior to movement, educate on  tendon gliding, review education on modifications of grasp and/or AE to aid in ADLs/IADLs, make adaptive key handle.   Rosalio Loud, OTR/L 03/13/2023, 12:51 PM

## 2023-03-14 LAB — HEPATIC FUNCTION PANEL
AG Ratio: 1.8 (calc) (ref 1.0–2.5)
ALT: 17 U/L (ref 6–29)
AST: 27 U/L (ref 10–35)
Albumin: 3.7 g/dL (ref 3.6–5.1)
Alkaline phosphatase (APISO): 62 U/L (ref 37–153)
Bilirubin, Direct: 0.1 mg/dL (ref 0.0–0.2)
Globulin: 2.1 g/dL (calc) (ref 1.9–3.7)
Indirect Bilirubin: 0.4 mg/dL (calc) (ref 0.2–1.2)
Total Bilirubin: 0.5 mg/dL (ref 0.2–1.2)
Total Protein: 5.8 g/dL — ABNORMAL LOW (ref 6.1–8.1)

## 2023-03-14 NOTE — Progress Notes (Signed)
AST has returned to WNL. ALT WNL.  Ok to increase MTX to 6 tablets weekly   Avoid Tylenol and alcohol use.  Recheck CBC and CMP in 2 weeks

## 2023-03-17 ENCOUNTER — Other Ambulatory Visit: Payer: Self-pay | Admitting: *Deleted

## 2023-03-17 DIAGNOSIS — R7989 Other specified abnormal findings of blood chemistry: Secondary | ICD-10-CM

## 2023-03-17 DIAGNOSIS — J849 Interstitial pulmonary disease, unspecified: Secondary | ICD-10-CM

## 2023-03-17 DIAGNOSIS — Z79899 Other long term (current) drug therapy: Secondary | ICD-10-CM

## 2023-03-17 DIAGNOSIS — M0579 Rheumatoid arthritis with rheumatoid factor of multiple sites without organ or systems involvement: Secondary | ICD-10-CM

## 2023-03-17 MED ORDER — METHOTREXATE SODIUM 2.5 MG PO TABS: ORAL_TABLET | ORAL | 0 refills | Status: DC

## 2023-03-17 NOTE — Telephone Encounter (Signed)
Last Fill: 12/30/2022  Labs: 02/27/2023 CBC Wnl   AST is borderline elevated-36.ALT WNL.   Rest of CMP Wnl.   Reviewed lab work with Dr. Corliss Skains.   Recommend holding tylenol.  Recheck hepatic function panel in 2 weeks if liver enzymes have improved--we can discuss increasing the dose of MTX at that time. Plaquenil is within a therapeutic range   Next Visit: 05/08/2023  Last Visit: 02/27/2023  DX: Rheumatoid arthritis involving multiple sites with positive rheumatoid factor   Current Dose per office note 02/27/2023:  Methotrexate 6 tablets by mouth once weekly   Okay to refill Methotrexate?

## 2023-03-19 ENCOUNTER — Ambulatory Visit: Payer: Medicare Other | Attending: Family Medicine | Admitting: Occupational Therapy

## 2023-03-19 DIAGNOSIS — M25541 Pain in joints of right hand: Secondary | ICD-10-CM | POA: Diagnosis not present

## 2023-03-19 DIAGNOSIS — M25512 Pain in left shoulder: Secondary | ICD-10-CM | POA: Diagnosis not present

## 2023-03-19 DIAGNOSIS — M25511 Pain in right shoulder: Secondary | ICD-10-CM | POA: Diagnosis not present

## 2023-03-19 DIAGNOSIS — M25542 Pain in joints of left hand: Secondary | ICD-10-CM | POA: Insufficient documentation

## 2023-03-19 NOTE — Therapy (Addendum)
OUTPATIENT OCCUPATIONAL THERAPY NEURO  Treatment Note  Patient Name: [name] MRN: [id number] DOB:1946-04-27, 77 y.o., female Today's Date: 03/20/2023  PCP: Shon Hale, MD REFERRING PROVIDER: Shon Hale, MD  END OF SESSION:  OT End of Session - 03/19/23 1154     Visit Number 3    Number of Visits 6    Date for OT Re-Evaluation 05/02/23    Authorization Type [employer] Medicare    OT Start Time 1149    OT Stop Time 1231    OT Time Calculation (min) 42 min    Activity Tolerance Patient tolerated treatment well               Past Medical History:  Diagnosis Date   Anxiety    Asthma    Breast cancer (HCC)    left breast   Carpal tunnel syndrome    Depression    Habitual alcohol use    Heart attack (HCC)    Hypertension    Interstitial lung disease (HCC)    Personal history of chemotherapy    Personal history of radiation therapy    RA (rheumatoid arthritis) (HCC)    Spinal stenosis    Vision abnormalities    Past Surgical History:  Procedure Laterality Date   BREAST BIOPSY     BREAST LUMPECTOMY  2005   left   CARDIAC CATHETERIZATION N/A 07/04/2016   Procedure: Left Heart Cath and Coronary Angiography;  Surgeon: Lyn Records, MD;  Location[facility] INVASIVE CV LAB;  Service: Cardiovascular;  Laterality: N/A;   CARDIAC CATHETERIZATION N/A 07/04/2016   Procedure: Temporary Pacemaker;  Surgeon: Lyn Records, MD;  Location: [facility] INVASIVE CV LAB;  Service: Cardiovascular;  Laterality: N/A;   CARPAL TUNNEL RELEASE     CATARACT EXTRACTION Bilateral    EP IMPLANTABLE DEVICE N/A 07/05/2016   Procedure: Pacemaker Implant;  Surgeon: Marinus Maw, MD;  Location: [facility] INVASIVE CV LAB;  Service: Cardiovascular;  Laterality: N/A;   herniated rectum repair   11/2020   LAMINECTOMY  1966   LYMPHECTOMY  2005   Patient Active Problem List   Diagnosis Date Noted   ILD (interstitial lung disease) (HCC) 01/21/2023   Acute respiratory failure with hypoxia (HCC)  02/24/2022   GERD (gastroesophageal reflux disease) 02/24/2022   Elevated troponin 02/24/2022   Bilateral carpal tunnel syndrome 04/14/2018   Cervical spinal stenosis 01/07/2018   Cervical radiculopathy 01/07/2018   Stenosis of left subclavian artery (HCC) 12/15/2017   History of gastroesophageal reflux (GERD) 06/25/2017   History of bilateral carpal tunnel release 06/25/2017   Former smoker 01/25/2017   Rheumatoid arthritis involving multiple sites with positive rheumatoid factor (HCC) 01/17/2017   High risk medication use 01/17/2017   Primary osteoarthritis of both hands 01/17/2017   Primary osteoarthritis of both feet 01/17/2017   DJD (degenerative joint disease), cervical 01/17/2017   Spondylosis of lumbar region without myelopathy or radiculopathy 01/17/2017   Cardiomyopathy, ischemic 07/07/2016   CAD- S/P LAD thrombectomy 07/04/16 07/07/2016   Pacemaker-MDT BiV placed 07/05/16 07/07/2016   History of rheumatoid arthritis 07/07/2016   Complete heart block (HCC) 07/04/2016   Essential hypertension 07/04/2016   LBBB (left bundle branch block) 07/04/2016   Exostosis 06/18/2011   History of breast cancer 02/16/2010   Vitamin D deficiency 02/15/2010   HYPERCHOLESTEROLEMIA 02/15/2010   Anxiety state 02/15/2010   Asthma 02/15/2010   MIGRAINES, HX OF 02/15/2010   MASTECTOMY, LEFT, HX OF 02/15/2010    ONSET DATE: referral date 02/12/23  REFERRING DIAG: M06.9 (ICD-10-CM) - Rheumatoid arthritis, unspecified  THERAPY DIAG:  Pain in joint of left hand  Pain in joint of right hand  Left shoulder pain, unspecified chronicity  Right shoulder pain, unspecified chronicity  Rationale for Evaluation and Treatment: Rehabilitation  SUBJECTIVE:   SUBJECTIVE STATEMENT: Pt reports purchasing moist heating pad and utilizing it for pain.   Pt accompanied by: self  PERTINENT HISTORY: RA, ILD  PRECAUTIONS: None  WEIGHT BEARING RESTRICTIONS: No  PAIN:  Are you having pain? Yes: NPRS  scale: 5/10 Pain location: hands and shoulders Pain description: sharp, shooting, burning Aggravating factors: using hands Relieving factors: not doing anything  FALLS: Has patient fallen in last 6 months? No  LIVING ENVIRONMENT: Lives with: lives alone Lives in: Other townhouse Stairs: Yes: Internal: full flights steps; can reach both and External: 2 steps; none Has following equipment at home:  utilizing adaptive tools for opening jars/bottles, scissors, needle nose plyers  PLOF: Independent and Independent with basic ADLs  PATIENT GOALS: pain reduction  OBJECTIVE:   HAND DOMINANCE: Right  ADLs: Transfers/ambulation related to ADLs: Independent Eating: difficulty with opening containers (tabs and jars) Grooming: difficulty with styling hair due to pain in shoulder with holding hair dryer UB Dressing: difficulty with pulling shirt over head, buttons are extremely difficult LB Dressing: Mod I  Toileting: Mod I Bathing: difficulty with washing back Tub Shower transfers: Mod I with grab bars Equipment: Grab bars  MOBILITY STATUS: Independent  POSTURE COMMENTS:  rounded shoulders  FUNCTIONAL OUTCOME MEASURES: Upper Extremity Functional Scale (UEFS): 35/80 = 44% function  UPPER EXTREMITY ROM:    Active ROM Right eval Left eval  Shoulder flexion 142 108  Shoulder abduction    Shoulder adduction    Shoulder extension    Shoulder internal rotation    Shoulder external rotation    Elbow flexion    Elbow extension -14 WNL  Wrist flexion    Wrist extension    Wrist ulnar deviation    Wrist radial deviation    Wrist pronation    Wrist supination    (Blank rows = not tested)  COORDINATION: 9 Hole Peg test: Right: 31.56 sec; Left: 32.16 sec  SENSATION: Reports occasional numbness in hands based on position when sleeping  COGNITION: Overall cognitive status: Within functional limits for tasks assessed    TODAY'S TREATMENT:                                                                           03/19/23 AE/strategies: OT created built up handle for car key to allow pt increased ease to turn key in ignition.  OT utilized blue built up handle foam and placed around key to allow for increased hand hold and decreased pressure to turn.   Pain management: pt reports recognizing need to balance activity with rest to allow and "respect pain".  Reviewed use of heat for pain as well as resting between sets of exercises and alternating stressful and less stressful tasks. Shoulders:  Engaged in shoulder rolls forward and backward, scapular retraction, and shoulder internal/external rotation with focus on slow and controlled movements to allow for increased ROM.  OT educated on shoulder flexion exercise with use of shoulder height cabinet  shelf or counter top to facilitate gentle shoulder ROM (arm on body movements).  Reiterated utilizing available ROM before focusing on strengthening.    03/13/23 Massage: OT providing retrograde massage to R index finger and into palm to decrease edema and pain in finger and palm.  Pt with initial reports of pain in index finger but tolerating massage while OT educating on additional pain relief strategies. Pain:  OT educated on moist heat and provided pictures and suggestions for pt to purchase as needed. AE/strategies: educated on alternative tools to aid in cutting foods (use of pizza cutter, salad sheers, etc), use of electric toothbrush for increased grasp and decreased effort with brushing, and began looking for adaptive key holder to aid in pt concerns with decreased ability to turn key in car ignition due to pain.  Provided pt with printed resources of items to consider and alternative handling strategies to decrease joint pressure (from OT toolkit)   03/05/23 Brief introduction to use of AE to increase ease with aspects of bathing/dressing to allow for increased function with other tasks.  Discussed modifications to grasp  positioning to reduce impact/strain on joints.  Plan to further instruct during future sessions.  PATIENT EDUCATION: Education details: Educated on role and purpose of OT as well as potential interventions and goals for therapy based on initial evaluation findings. Person educated: Patient Education method: Explanation Education comprehension: verbalized understanding and needs further education  HOME EXERCISE PROGRAM: Access Code: [email or web address] URL: [email or web address] Date: 03/19/2023 Prepared by: [facility] - [facility]  Exercises - Seated Shoulder Rolls  - 2 x daily - 1-2 sets - 5-8 reps - Seated Scapular Retraction  - 2 x daily - 1-2 sets - 5-8 reps - Standing Shoulder External and Internal Rotation AROM  - 2 x daily - 1-2 sets - 5-8 reps   GOALS: Goals reviewed with patient? Yes  SHORT TERM GOALS: Target date: 04/04/23  Pt will be independent in gentle ROM exercises for pain management. Baseline: Goal status: IN PROGRESS  2.  Pt will verbalize understanding of use of modalities to decrease inflammation/pain.  Baseline:  Goal status: IN PROGRESS  3.  Pt will verbalize understanding of task modifications and/or potential AE needs to increase ease, safety, and independence with IADLS.  Baseline:  Goal status: IN PROGRESS   LONG TERM GOALS: Target date: 05/02/23  Pt will verbalize and/or demonstrate ability to don shirt with decreased pain with use of adaptive strategies. Baseline:  Goal status: IN PROGRESS  2.  Pt will demonstrate improved ease to manage clothing fasteners as demonstrated by decreased time to button/unbutton 3 buttons by 10 seconds. Baseline: TBD Goal status: IN PROGRESS  3.  Pt will report decreased pain in R hand/index finger during activities requiring grip to less than 5 on pain scale.  Baseline:  Goal status: IN PROGRESS  4.  Pt will report improved functional use of BUE as evidenced by improved score on  UEFS to >/= to 40/80. Baseline: 35/80 Goal status: IN PROGRESS   ASSESSMENT:  CLINICAL IMPRESSION: Pt reports making notes on previous handout for modifications and recognizing some modifications that she has already made as well as others than she can begin to incorporate.  Pt reports needing to "respect my pain" and focus on balancing rest with activity.  Pt pleased with built up handle for car key and completed shoulder ROM with good mobility and no reports of pain.  PERFORMANCE DEFICITS: in functional skills including ADLs, IADLs,  ROM, strength, pain, flexibility, Fine motor control, decreased knowledge of precautions, decreased knowledge of use of DME, and UE functional use and psychosocial skills including environmental adaptation.   IMPAIRMENTS: are limiting patient from ADLs and IADLs.   CO-MORBIDITIES: may have co-morbidities  that affects occupational performance. Patient will benefit from skilled OT to address above impairments and improve overall function.  MODIFICATION OR ASSISTANCE TO COMPLETE EVALUATION: Min-Moderate modification of tasks or assist with assess necessary to complete an evaluation.  OT OCCUPATIONAL PROFILE AND HISTORY: Problem focused assessment: Including review of records relating to presenting problem.  CLINICAL DECISION MAKING: LOW - limited treatment options, no task modification necessary  REHAB POTENTIAL: Good  EVALUATION COMPLEXITY: Low    PLAN:  OT FREQUENCY: 1x/week  OT DURATION: 8 weeks (to accommodate scheduling)  PLANNED INTERVENTIONS: self care/ADL training, therapeutic exercise, therapeutic activity, manual therapy, passive range of motion, splinting, ultrasound, paraffin, fluidotherapy, compression bandaging, moist heat, cryotherapy, patient/family education, psychosocial skills training, and DME and/or AE instructions  RECOMMENDED OTHER SERVICES: NA  CONSULTED AND AGREED WITH PLAN OF CARE: Patient  PLAN FOR NEXT SESSION: heat  prior to movement, educate on tendon gliding, review education on modifications of grasp and/or AE to aid in ADLs/IADLs  [place], Maralyn Sago, OTR/L 07/25/2951, 7:37 AM   OCCUPATIONAL THERAPY DISCHARGE SUMMARY  Visits from Start of Care: 3  Current functional level related to goals / functional outcomes: Unsure as pt has not returned since above visit.  On 04/07/2023 Pt called to cancel and requested to not schedule any further appointments.  Pt had reported utilizing joint protection strategies and had begun shoulder ROM/strengthening HEP.   Remaining deficits: Joint swelling, decreased shoulder ROM, painful joints   Education / Equipment: Educated on joint protection principles, modifications, AE/DME, and shoulder ROM HEP   Patient agrees to discharge. Patient goals were not met. Patient is being discharged due to not returning since the last visit.Marland Kitchen   Rosalio Loud, OT 05/07/23

## 2023-03-26 ENCOUNTER — Encounter: Payer: Medicare Other | Admitting: Occupational Therapy

## 2023-03-27 ENCOUNTER — Other Ambulatory Visit: Payer: Self-pay | Admitting: *Deleted

## 2023-03-27 DIAGNOSIS — Z79899 Other long term (current) drug therapy: Secondary | ICD-10-CM | POA: Diagnosis not present

## 2023-03-27 DIAGNOSIS — M0579 Rheumatoid arthritis with rheumatoid factor of multiple sites without organ or systems involvement: Secondary | ICD-10-CM | POA: Diagnosis not present

## 2023-03-27 DIAGNOSIS — J849 Interstitial pulmonary disease, unspecified: Secondary | ICD-10-CM

## 2023-03-27 DIAGNOSIS — R7989 Other specified abnormal findings of blood chemistry: Secondary | ICD-10-CM | POA: Diagnosis not present

## 2023-03-28 LAB — CBC WITH DIFFERENTIAL/PLATELET
Absolute Monocytes: 781 cells/uL (ref 200–950)
Basophils Absolute: 73 cells/uL (ref 0–200)
Basophils Relative: 1 %
Eosinophils Absolute: 453 cells/uL (ref 15–500)
Eosinophils Relative: 6.2 %
HCT: 41.6 % (ref 35.0–45.0)
Hemoglobin: 13.9 g/dL (ref 11.7–15.5)
Lymphs Abs: 1832 cells/uL (ref 850–3900)
MCH: 31.4 pg (ref 27.0–33.0)
MCHC: 33.4 g/dL (ref 32.0–36.0)
MCV: 93.9 fL (ref 80.0–100.0)
MPV: 9.2 fL (ref 7.5–12.5)
Monocytes Relative: 10.7 %
Neutro Abs: 4161 cells/uL (ref 1500–7800)
Neutrophils Relative %: 57 %
Platelets: 322 10*3/uL (ref 140–400)
RBC: 4.43 10*6/uL (ref 3.80–5.10)
RDW: 13.2 % (ref 11.0–15.0)
Total Lymphocyte: 25.1 %
WBC: 7.3 10*3/uL (ref 3.8–10.8)

## 2023-03-28 LAB — COMPLETE METABOLIC PANEL WITH GFR
AG Ratio: 1.7 (calc) (ref 1.0–2.5)
ALT: 15 U/L (ref 6–29)
AST: 25 U/L (ref 10–35)
Albumin: 3.9 g/dL (ref 3.6–5.1)
Alkaline phosphatase (APISO): 62 U/L (ref 37–153)
BUN/Creatinine Ratio: 33 (calc) — ABNORMAL HIGH (ref 6–22)
BUN: 17 mg/dL (ref 7–25)
CO2: 30 mmol/L (ref 20–32)
Calcium: 9.2 mg/dL (ref 8.6–10.4)
Chloride: 101 mmol/L (ref 98–110)
Creat: 0.52 mg/dL — ABNORMAL LOW (ref 0.60–1.00)
Globulin: 2.3 g/dL (calc) (ref 1.9–3.7)
Glucose, Bld: 65 mg/dL (ref 65–99)
Potassium: 4.1 mmol/L (ref 3.5–5.3)
Sodium: 139 mmol/L (ref 135–146)
Total Bilirubin: 0.6 mg/dL (ref 0.2–1.2)
Total Protein: 6.2 g/dL (ref 6.1–8.1)
eGFR: 94 mL/min/{1.73_m2} (ref >=60)

## 2023-03-28 NOTE — Progress Notes (Signed)
CBC WNL.  LFTs are WNL.  Creatinine is borderline low likely due to hydration status. GFR WNL. Rest of CMP WNL

## 2023-04-03 ENCOUNTER — Other Ambulatory Visit: Payer: Self-pay | Admitting: *Deleted

## 2023-04-03 MED ORDER — FLUTICASONE-SALMETEROL 115-21 MCG/ACT IN AERO: 2.0000 | INHALATION_SPRAY | Freq: Two times a day (BID) | RESPIRATORY_TRACT | 3 refills | Status: DC

## 2023-04-09 ENCOUNTER — Ambulatory Visit: Payer: Medicare Other | Admitting: Occupational Therapy

## 2023-04-21 DIAGNOSIS — Z79899 Other long term (current) drug therapy: Secondary | ICD-10-CM | POA: Diagnosis not present

## 2023-04-21 DIAGNOSIS — H52203 Unspecified astigmatism, bilateral: Secondary | ICD-10-CM | POA: Diagnosis not present

## 2023-04-21 DIAGNOSIS — H26493 Other secondary cataract, bilateral: Secondary | ICD-10-CM | POA: Diagnosis not present

## 2023-04-24 NOTE — Progress Notes (Unsigned)
Office Visit Note  Patient: [name]             Date of Birth: 26-Apr-1946           MRN: [id number]             PCP: Shon Hale, MD Referring: Shon Hale, * Visit Date: 05/08/2023 Occupation: @GUAROCC @  Subjective:  Left SI joint pain   History of Present Illness: Adrena Lundeen is a 77 y.o. female with history of seropositive rheumatoid arthritis and ILD.  Patient remains on Methotrexate 6 tablets by mouth once weekly, folic acid 1 mg by mouth daily, and hydroxychloroquine 200 mg daily Monday to Friday.  She has been tolerating combination therapy without any side effects.  Patient states since his increasing the dose of methotrexate from 4 tablets to 6 tablets weekly she has noticed better wellbeing overall.  She states that she has not necessarily noticed any improvement in the pain and inflammation in her joints since increasing the dose.  She has had intermittent pain in the right shoulder as well as nocturnal symptoms at times.  She has also been having ongoing pain and intermittent formation in both hands.  She has been avoiding Tylenol as advised.  She denies any new or worsening pulmonary symptoms.  She is apprehensive to make any medication changes since her symptoms have been manageable overall.  Patient states that she has had increased discomfort in the left SI joint.  She has been having difficulty ambulating at times and would like to set up a left SI joint injection.   Activities of Daily Living:  Patient reports morning stiffness for a few minutes.   Patient Denies nocturnal pain.  Difficulty dressing/grooming: Reports Difficulty climbing stairs: Reports Difficulty getting out of chair: Denies Difficulty using hands for taps, buttons, cutlery, and/or writing: Reports  Review of Systems  Constitutional:  Positive for fatigue.  HENT:  Positive for mouth dryness. Negative for mouth sores.   Eyes:  Negative for dryness.  Respiratory:  Positive  for cough, shortness of breath and wheezing.   Cardiovascular:  Negative for chest pain and palpitations.  Gastrointestinal:  Negative for blood in stool, constipation and diarrhea.  Endocrine: Negative for increased urination.  Genitourinary:  Negative for involuntary urination.  Musculoskeletal:  Positive for joint pain, gait problem, joint pain, muscle weakness and morning stiffness. Negative for joint swelling, myalgias, muscle tenderness and myalgias.  Skin:  Negative for color change, rash, hair loss and sensitivity to sunlight.  Allergic/Immunologic: Negative for susceptible to infections.  Neurological:  Negative for dizziness and headaches.  Hematological:  Negative for swollen glands.  Psychiatric/Behavioral:  Negative for depressed mood and sleep disturbance. The patient is nervous/anxious.     PMFS History:  Patient Active Problem List   Diagnosis Date Noted   ILD (interstitial lung disease) (HCC) 01/21/2023   Acute respiratory failure with hypoxia (HCC) 02/24/2022   GERD (gastroesophageal reflux disease) 02/24/2022   Elevated troponin 02/24/2022   Bilateral carpal tunnel syndrome 04/14/2018   Cervical spinal stenosis 01/07/2018   Cervical radiculopathy 01/07/2018   Stenosis of left subclavian artery (HCC) 12/15/2017   History of gastroesophageal reflux (GERD) 06/25/2017   History of bilateral carpal tunnel release 06/25/2017   Former smoker 01/25/2017   Rheumatoid arthritis involving multiple sites with positive rheumatoid factor (HCC) 01/17/2017   High risk medication use 01/17/2017   Primary osteoarthritis of both hands 01/17/2017   Primary osteoarthritis of both feet 01/17/2017   DJD (degenerative  joint disease), cervical 01/17/2017   Spondylosis of lumbar region without myelopathy or radiculopathy 01/17/2017   Cardiomyopathy, ischemic 07/07/2016   CAD- S/P LAD thrombectomy 07/04/16 07/07/2016   Pacemaker-MDT BiV placed 07/05/16 07/07/2016   History of rheumatoid  arthritis 07/07/2016   Complete heart block (HCC) 07/04/2016   Essential hypertension 07/04/2016   LBBB (left bundle branch block) 07/04/2016   Exostosis 06/18/2011   History of breast cancer 02/16/2010   Vitamin D deficiency 02/15/2010   HYPERCHOLESTEROLEMIA 02/15/2010   Anxiety state 02/15/2010   Asthma 02/15/2010   MIGRAINES, HX OF 02/15/2010   MASTECTOMY, LEFT, HX OF 02/15/2010    Past Medical History:  Diagnosis Date   Anxiety    Asthma    Breast cancer (HCC)    left breast   Carpal tunnel syndrome    Depression    Habitual alcohol use    Heart attack (HCC)    Hypertension    Interstitial lung disease (HCC)    Personal history of chemotherapy    Personal history of radiation therapy    RA (rheumatoid arthritis) (HCC)    Spinal stenosis    Vision abnormalities     Family History  Problem Relation Age of Onset   Stroke Mother    Cancer Father        colon?   Asthma Father    Pyelonephritis Sister    Pulmonary fibrosis Sister    Asthma Paternal Grandfather    Cancer Paternal Aunt    Cancer Paternal Uncle    Past Surgical History:  Procedure Laterality Date   BREAST BIOPSY     BREAST LUMPECTOMY  2005   left   CARDIAC CATHETERIZATION N/A 07/04/2016   Procedure: Left Heart Cath and Coronary Angiography;  Surgeon: Lyn Records, MD;  Location: [facility] INVASIVE CV LAB;  Service: Cardiovascular;  Laterality: N/A;   CARDIAC CATHETERIZATION N/A 07/04/2016   Procedure: Temporary Pacemaker;  Surgeon: Lyn Records, MD;  Location: [facility] INVASIVE CV LAB;  Service: Cardiovascular;  Laterality: N/A;   CARPAL TUNNEL RELEASE     CATARACT EXTRACTION Bilateral    EP IMPLANTABLE DEVICE N/A 07/05/2016   Procedure: Pacemaker Implant;  Surgeon: Marinus Maw, MD;  Location: [facility] INVASIVE CV LAB;  Service: Cardiovascular;  Laterality: N/A;   herniated rectum repair   11/2020   LAMINECTOMY  1966   LYMPHECTOMY  2005   Social History   Social History Narrative   Not on file    Immunization History  Administered Date(s) Administered   Fluad Quad(high Dose 65+) 08/16/2019, 08/05/2022   Influenza Split 09/10/2007, 09/05/2009, 08/05/2011, 09/15/2012, 08/12/2013   Influenza Whole 08/21/2009, 08/29/2010   Influenza, High Dose Seasonal PF 08/30/2021   Influenza,inj,Quad PF,6+ Mos 08/21/2016, 09/24/2017, 07/27/2018, 08/21/2020   Influenza,inj,quad, With Preservative 08/01/2014, 08/16/2015   PFIZER(Purple Top)SARS-COV-2 Vaccination 01/14/2020, 02/08/2020, 08/08/2020, 04/09/2021   Pneumococcal Conjugate-13 08/01/2014   Pneumococcal Polysaccharide-23 05/08/2009, 04/23/2022   Td 01/26/2018   Tdap 05/06/2007   Zoster, Live 06/16/2009     Objective: Vital Signs: BP (!) 151/62 (BP Location: Right Arm, Patient Position: Sitting, Cuff Size: Normal)   Pulse 73   Resp 14   Ht 5\' 2"  (1.575 m)   Wt 122 lb (55.3 kg)   BMI 22.31 kg/m    Physical Exam Vitals and nursing note reviewed.  Constitutional:      Appearance: She is well-developed.  HENT:     Head: Normocephalic and atraumatic.  Eyes:     Conjunctiva/sclera: Conjunctivae normal.  Cardiovascular:  Rate and Rhythm: Normal rate and regular rhythm.     Heart sounds: Normal heart sounds.  Pulmonary:     Effort: Pulmonary effort is normal.     Breath sounds: Wheezing present.  Abdominal:     General: Bowel sounds are normal.     Palpations: Abdomen is soft.  Musculoskeletal:     Cervical back: Normal range of motion.  Skin:    General: Skin is warm and dry.     Capillary Refill: Capillary refill takes less than 2 seconds.  Neurological:     Mental Status: She is alert and oriented to person, place, and time.  Psychiatric:        Behavior: Behavior normal.      Musculoskeletal Exam: C-spine has limited range of motion.  Thoracic kyphosis noted.  Tenderness over both SI joints, left greater than right.  No midline spinal tenderness.  Shoulder joints have good range of motion with some discomfort in  her right shoulder.  Tenderness over the right subacromial bursa.  Right elbow joint flexion contracture unchanged.  Thickening of the MCP joints.  PIP and DIP thickening consistent with osteoarthritis of both hands.  Hip joints have slightly limited range of motion.  Knee joints have good range of motion with no warmth or effusion.  Ankle joints have good range of motion with no tenderness or joint swelling.  CDAI Exam: CDAI Score: -- Patient Global: --; Provider Global: -- Swollen: --; Tender: -- Joint Exam 05/08/2023   No joint exam has been documented for this visit   There is currently no information documented on the homunculus. Go to the Rheumatology activity and complete the homunculus joint exam.  Investigation: No additional findings.  Imaging: CUP PACEART REMOTE DEVICE CHECK  Result Date: 05/07/2023 Scheduled remote reviewed. Normal device function.  2 NSVT, 7 & 33 beats (5/8 @ 04:48) Next remote 91 days. LA, CVRS   Recent Labs: Lab Results  Component Value Date   WBC 7.3 03/27/2023   HGB 13.9 03/27/2023   PLT 322 03/27/2023   NA 139 03/27/2023   K 4.1 03/27/2023   CL 101 03/27/2023   CO2 30 03/27/2023   GLUCOSE 65 03/27/2023   BUN 17 03/27/2023   CREATININE 0.52 (L) 03/27/2023   BILITOT 0.6 03/27/2023   ALKPHOS 76 11/14/2022   AST 25 03/27/2023   ALT 15 03/27/2023   PROT 6.2 03/27/2023   ALBUMIN 3.2 (L) 11/14/2022   CALCIUM 9.2 03/27/2023   GFRAA 108 12/26/2020    Speciality Comments: PLQ Eye Exam: 04/21/2023 WNL @ [facility] f/u 12  months.  Procedures:  No procedures performed Allergies: Betadine [povidone iodine] and Hydrocodone     Assessment / Plan:     Visit Diagnoses: Rheumatoid arthritis involving multiple sites with positive rheumatoid factor (HCC): She has no synovitis on examination today.  She continues to experience intermittent arthralgias and joint stiffness.  She has had intermittent discomfort in her right shoulder as well  as ongoing pain in both hands.  She is currently taking methotrexate 6 tablets by mouth once weekly along with Plaquenil 200 mg 1 tablet by mouth Monday through Friday.  She is tolerating combination therapy.  Patient states that since increasing methotrexate from 4 tablets to 6 tablets once weekly after last office visit on 02/27/2023 she has noticed a feeling of greater general wellbeing but continues to have persistent pain involving multiple joints.  Different treatment options were discussed today including the use of Imuran, CellCept, or rituximab  for management of rheumatoid arthritis and ILD overlap.  She is apprehensive to make any medication changes at this time.  She would like to remain on methotrexate and Plaquenil as prescribed.  She will notify us if her symptoms persist or worsen.  We can also schedule an ultrasound in the future to assess for synovitis if needed.  She will follow-up in the office in 2 months or sooner if needed.  High risk medication use - Methotrexate 6 tablets by mouth once weekly, folic acid 1 mg by mouth daily, and hydroxychloroquine 200 mg  daily Monday to Friday. CBC and CMP updated on 03/27/23. Her next lab work will be due in August and every 3 months.  PLQ Eye Exam: 04/21/2023 WNL @ [facility] f/u 12 months.   ILD (interstitial lung disease) (HCC) - Probable early UIP related to underlying RA- High-resolution chest CT 05/23/2022. Patient was evaluated at [place] on 01/21/23-Dyspnea with exertion and intermittent wheezing. CT chest in July 2023 suggestive of interstitial lung disease. 10% decrease in lung function. CT: 02/07/23: Mild subpleural fibrosis without a definite zonal predominance and questionable single layer honeycombing. Findings are categorized as probable UIP per consensus guidelines: Diagnosis of Idiopathic Pulmonary Fibrosis. Patient was evaluated by Dr. Celine Mans 02/26/23-there was discussion of remaining on methotrexate and Plaquenil from a  pulmonary standpoint and she was okay with increasing the dose of methotrexate if needed.  There was also discussion of possibly changing to CellCept, Imuran, and rituximab in the future if needed. The patient remains apprehensive of making any drastic medication changes.  The dose of methotrexate was increased from 4 tablets to 6 tablets weekly after her last office visit on 02/27/2023.  She has not noticed any new or worsening pulmonary symptoms  Elevated LFTs: LFTs within normal limits on 03/27/2023.  She has been avoiding the use of Tylenol.  Arthritis of right acromioclavicular joint: Patient has good range of motion of the right shoulder joint on examination today.  She has been experiencing intermittent discomfort in her right shoulder and has had an episode of nocturnal pain.  On examination she has tenderness over the subacromial bursa.  She was advised to notify us if her symptoms persist or worsen at which time we can further evaluate.   Contracture of right elbow: No tenderness or inflammation at this time.   Primary osteoarthritis of both hands: She has PIP and DIP thickening consistent with osteoarthritis of both hands.  Trigger finger, right middle finger: Not currently symptomatic.   Primary osteoarthritis of right knee: Good ROM of the right knee on examination today.  No warmth or effusion noted.   Trochanteric bursitis, left hip: Resolved.  No tenderness upon palpation today.  Primary osteoarthritis of both feet: Good range of motion of both ankle joints with no tenderness or synovitis on examination today.  DDD (degenerative disc disease), cervical: Limited range of motion.  DDD (degenerative disc disease), lumbar: No midline spinal tenderness in the lumbar region.  Chronic left SI joint pain -Patient presents today with left SI joint pain.  She has been having some difficulty ambulating at times due to the severity of discomfort.  On examination she has no midline spinal  tenderness.  She has tenderness over the left SI joint upon palpation.  Different treatment options were discussed.  X-rays of the pelvis were obtained today for further evaluation.  Plan to schedule follow-up visit next week for a left SI joint cortisone injection.   plan: XR Pelvis 1-2 Views  Osteoporosis screening - DEXA 02/21/20: DualFemur Neck Left 02/21/2020 76.0 -0.8 0.929 g/cm2.  She remains on calcium and vitamin D supplement daily.  History of vitamin D deficiency: She is taking vitamin D 2000 units daily.   Other medical conditions are listed as follows:  History of gastroesophageal reflux (GERD)  History of hyperlipidemia  History of anxiety  History of breast cancer  History of ventricular tachycardia  Former smoker  History of asthma  History of hypertension: Blood pressure was slightly elevated today in the office and was rechecked prior to leaving.  Patient was advised to monitor blood pressure closely and reach out to her PCP if it remains elevated.   History of left bundle branch block   Orders: Orders Placed This Encounter  Procedures   XR Pelvis 1-2 Views   No orders of the defined types were placed in this encounter.    Follow-Up Instructions: Return in about 2 months (around 07/08/2023) for Rheumatoid arthritis, ILD.   Gearldine Bienenstock, PA-C  Note - This record has been created using Dragon software.  Chart creation errors have been sought, but may not always  have been located. Such creation errors do not reflect on  the standard of medical care.

## 2023-05-01 ENCOUNTER — Other Ambulatory Visit: Payer: Self-pay | Admitting: Rheumatology

## 2023-05-01 DIAGNOSIS — M0579 Rheumatoid arthritis with rheumatoid factor of multiple sites without organ or systems involvement: Secondary | ICD-10-CM

## 2023-05-01 NOTE — Telephone Encounter (Signed)
Last Fill: 01/16/2023  Eye exam:  04/21/2023 WNL    Labs: 03/27/2023 CBC WNL. LFTs are WNL.  Creatinine is borderline low likely due to hydration status. GFR WNL. Rest of CMP WNL  Next Visit: 05/08/2023  Last Visit: 02/27/2023  [id number] arthritis involving multiple sites with positive rheumatoid facto   Current Dose per office note 02/27/2023: hydroxychloroquine 200 mg daily Monday to Friday.   Okay to refill Plaquenil?

## 2023-05-06 ENCOUNTER — Ambulatory Visit (INDEPENDENT_AMBULATORY_CARE_PROVIDER_SITE_OTHER): Payer: Medicare Other

## 2023-05-06 DIAGNOSIS — I442 Atrioventricular block, complete: Secondary | ICD-10-CM | POA: Diagnosis not present

## 2023-05-07 DIAGNOSIS — J439 Emphysema, unspecified: Secondary | ICD-10-CM | POA: Diagnosis not present

## 2023-05-07 DIAGNOSIS — R7303 Prediabetes: Secondary | ICD-10-CM | POA: Diagnosis not present

## 2023-05-07 DIAGNOSIS — D84821 Immunodeficiency due to drugs: Secondary | ICD-10-CM | POA: Diagnosis not present

## 2023-05-07 DIAGNOSIS — I700 Atherosclerosis of aorta: Secondary | ICD-10-CM | POA: Diagnosis not present

## 2023-05-07 DIAGNOSIS — I442 Atrioventricular block, complete: Secondary | ICD-10-CM | POA: Diagnosis not present

## 2023-05-07 DIAGNOSIS — I10 Essential (primary) hypertension: Secondary | ICD-10-CM | POA: Diagnosis not present

## 2023-05-07 DIAGNOSIS — E785 Hyperlipidemia, unspecified: Secondary | ICD-10-CM | POA: Diagnosis not present

## 2023-05-07 DIAGNOSIS — I2510 Atherosclerotic heart disease of native coronary artery without angina pectoris: Secondary | ICD-10-CM | POA: Diagnosis not present

## 2023-05-07 DIAGNOSIS — J84112 Idiopathic pulmonary fibrosis: Secondary | ICD-10-CM | POA: Diagnosis not present

## 2023-05-07 DIAGNOSIS — Z0000 Encounter for general adult medical examination without abnormal findings: Secondary | ICD-10-CM | POA: Diagnosis not present

## 2023-05-07 DIAGNOSIS — Z950 Presence of cardiac pacemaker: Secondary | ICD-10-CM | POA: Diagnosis not present

## 2023-05-07 LAB — CUP PACEART REMOTE DEVICE CHECK
Battery Remaining Longevity: 32 mo
Battery Voltage: 2.94 V
Brady Statistic AP VP Percent: 0.66 %
Brady Statistic AP VS Percent: 0.03 %
Brady Statistic AS VP Percent: 99.28 %
Brady Statistic AS VS Percent: 0.04 %
Brady Statistic RA Percent Paced: 0.68 %
Brady Statistic RV Percent Paced: 99.83 %
Date Time Interrogation Session: 20240618023316
Implantable Lead Connection Status: 753985
Implantable Lead Connection Status: 753985
Implantable Lead Connection Status: 753985
Implantable Lead Implant Date: 20170818
Implantable Lead Implant Date: 20170818
Implantable Lead Implant Date: 20170818
Implantable Lead Location: 753858
Implantable Lead Location: 753859
Implantable Lead Location: 753860
Implantable Lead Model: 4396
Implantable Lead Model: 5076
Implantable Lead Model: 5076
Implantable Pulse Generator Implant Date: 20170818
Lead Channel Impedance Value: 342 Ohm
Lead Channel Impedance Value: 418 Ohm
Lead Channel Impedance Value: 437 Ohm
Lead Channel Impedance Value: 456 Ohm
Lead Channel Impedance Value: 513 Ohm
Lead Channel Impedance Value: 513 Ohm
Lead Channel Impedance Value: 532 Ohm
Lead Channel Impedance Value: 646 Ohm
Lead Channel Impedance Value: 836 Ohm
Lead Channel Pacing Threshold Amplitude: 0.375 V
Lead Channel Pacing Threshold Amplitude: 0.625 V
Lead Channel Pacing Threshold Amplitude: 0.75 V
Lead Channel Pacing Threshold Pulse Width: 0.4 ms
Lead Channel Pacing Threshold Pulse Width: 0.4 ms
Lead Channel Pacing Threshold Pulse Width: 0.4 ms
Lead Channel Sensing Intrinsic Amplitude: 1 mV
Lead Channel Sensing Intrinsic Amplitude: 1 mV
Lead Channel Sensing Intrinsic Amplitude: 5.875 mV
Lead Channel Sensing Intrinsic Amplitude: 5.875 mV
Lead Channel Setting Pacing Amplitude: 1.5 V
Lead Channel Setting Pacing Amplitude: 1.75 V
Lead Channel Setting Pacing Amplitude: 2 V
Lead Channel Setting Pacing Pulse Width: 0.4 ms
Lead Channel Setting Pacing Pulse Width: 0.4 ms
Lead Channel Setting Sensing Sensitivity: 0.9 mV
Zone Setting Status: 755011
Zone Setting Status: 755011

## 2023-05-08 ENCOUNTER — Encounter: Payer: Self-pay | Admitting: Physician Assistant

## 2023-05-08 ENCOUNTER — Ambulatory Visit (INDEPENDENT_AMBULATORY_CARE_PROVIDER_SITE_OTHER): Payer: Medicare Other

## 2023-05-08 ENCOUNTER — Ambulatory Visit: Payer: Medicare Other | Attending: Physician Assistant | Admitting: Physician Assistant

## 2023-05-08 VITALS — BP 151/62 | HR 73 | Resp 14 | Ht 62.0 in | Wt 122.0 lb

## 2023-05-08 DIAGNOSIS — Z8719 Personal history of other diseases of the digestive system: Secondary | ICD-10-CM

## 2023-05-08 DIAGNOSIS — G8929 Other chronic pain: Secondary | ICD-10-CM

## 2023-05-08 DIAGNOSIS — M7062 Trochanteric bursitis, left hip: Secondary | ICD-10-CM | POA: Diagnosis not present

## 2023-05-08 DIAGNOSIS — M65331 Trigger finger, right middle finger: Secondary | ICD-10-CM | POA: Diagnosis not present

## 2023-05-08 DIAGNOSIS — M5030 Other cervical disc degeneration, unspecified cervical region: Secondary | ICD-10-CM | POA: Diagnosis not present

## 2023-05-08 DIAGNOSIS — R7989 Other specified abnormal findings of blood chemistry: Secondary | ICD-10-CM | POA: Diagnosis not present

## 2023-05-08 DIAGNOSIS — M19041 Primary osteoarthritis, right hand: Secondary | ICD-10-CM

## 2023-05-08 DIAGNOSIS — M533 Sacrococcygeal disorders, not elsewhere classified: Secondary | ICD-10-CM

## 2023-05-08 DIAGNOSIS — Z8709 Personal history of other diseases of the respiratory system: Secondary | ICD-10-CM

## 2023-05-08 DIAGNOSIS — M19072 Primary osteoarthritis, left ankle and foot: Secondary | ICD-10-CM

## 2023-05-08 DIAGNOSIS — J849 Interstitial pulmonary disease, unspecified: Secondary | ICD-10-CM

## 2023-05-08 DIAGNOSIS — Z79899 Other long term (current) drug therapy: Secondary | ICD-10-CM | POA: Diagnosis not present

## 2023-05-08 DIAGNOSIS — Z8659 Personal history of other mental and behavioral disorders: Secondary | ICD-10-CM

## 2023-05-08 DIAGNOSIS — M1711 Unilateral primary osteoarthritis, right knee: Secondary | ICD-10-CM | POA: Diagnosis not present

## 2023-05-08 DIAGNOSIS — Z8679 Personal history of other diseases of the circulatory system: Secondary | ICD-10-CM

## 2023-05-08 DIAGNOSIS — M24521 Contracture, right elbow: Secondary | ICD-10-CM

## 2023-05-08 DIAGNOSIS — M0579 Rheumatoid arthritis with rheumatoid factor of multiple sites without organ or systems involvement: Secondary | ICD-10-CM

## 2023-05-08 DIAGNOSIS — Z87891 Personal history of nicotine dependence: Secondary | ICD-10-CM

## 2023-05-08 DIAGNOSIS — M19011 Primary osteoarthritis, right shoulder: Secondary | ICD-10-CM

## 2023-05-08 DIAGNOSIS — M19071 Primary osteoarthritis, right ankle and foot: Secondary | ICD-10-CM

## 2023-05-08 DIAGNOSIS — M19042 Primary osteoarthritis, left hand: Secondary | ICD-10-CM

## 2023-05-08 DIAGNOSIS — Z8639 Personal history of other endocrine, nutritional and metabolic disease: Secondary | ICD-10-CM

## 2023-05-08 DIAGNOSIS — M5136 Other intervertebral disc degeneration, lumbar region: Secondary | ICD-10-CM

## 2023-05-08 DIAGNOSIS — M51369 Other intervertebral disc degeneration, lumbar region without mention of lumbar back pain or lower extremity pain: Secondary | ICD-10-CM

## 2023-05-08 DIAGNOSIS — Z853 Personal history of malignant neoplasm of breast: Secondary | ICD-10-CM

## 2023-05-08 DIAGNOSIS — Z13820 Encounter for screening for osteoporosis: Secondary | ICD-10-CM

## 2023-05-14 ENCOUNTER — Encounter: Payer: Self-pay | Admitting: Rheumatology

## 2023-05-14 ENCOUNTER — Ambulatory Visit: Payer: Medicare Other | Attending: Rheumatology | Admitting: Rheumatology

## 2023-05-14 VITALS — BP 131/64 | HR 81

## 2023-05-14 DIAGNOSIS — G8929 Other chronic pain: Secondary | ICD-10-CM | POA: Diagnosis not present

## 2023-05-14 DIAGNOSIS — M533 Sacrococcygeal disorders, not elsewhere classified: Secondary | ICD-10-CM | POA: Diagnosis not present

## 2023-05-14 MED ORDER — LIDOCAINE HCL 1 % IJ SOLN
1.0000 mL | INTRAMUSCULAR | Status: AC | PRN
Start: 2023-05-14 — End: 2023-05-14
  Administered 2023-05-14: 1 mL

## 2023-05-14 MED ORDER — TRIAMCINOLONE ACETONIDE 40 MG/ML IJ SUSP
40.0000 mg | INTRAMUSCULAR | Status: AC | PRN
Start: 2023-05-14 — End: 2023-05-14
  Administered 2023-05-14: 40 mg via INTRA_ARTICULAR

## 2023-05-14 NOTE — Progress Notes (Signed)
   Procedure Note  Patient: [name]             Date of Birth: Dec 15, 1945           MRN: [id number]             Visit Date: 05/14/2023  Procedures: Visit Diagnoses:  1. Chronic left SI joint pain   Patient was evaluated by Sherron Ales, PA-C on May 16, 2023.  She had been experiencing left SI joint discomfort and requested a cortisone injection.  She is here to receive cortisone injection to the left SI joint.  Indications and side effects of cortisone injection were discussed at length.  Patient wanted to proceed with the cortisone injection.  The procedure is described below.  Sacroiliac Joint Inj on 05/14/2023 11:38 AM Indications: pain Details: 27 G 1.5 in needle, posterior approach Medications: 1 mL lidocaine 1 %; 40 mg triamcinolone acetonide 40 MG/ML Aspirate: 0 mL Outcome: tolerated well, no immediate complications Procedure, treatment alternatives, risks and benefits explained, specific risks discussed. Consent was given by the patient. Immediately prior to procedure a time out was called to verify the correct patient, procedure, equipment, support staff and site/side marked as required. Patient was prepped and draped in the usual sterile fashion.     Pollyann Savoy, MD

## 2023-05-16 ENCOUNTER — Ambulatory Visit: Payer: Medicare Other | Admitting: Internal Medicine

## 2023-05-21 ENCOUNTER — Other Ambulatory Visit: Payer: Self-pay | Admitting: *Deleted

## 2023-05-21 MED ORDER — LOSARTAN POTASSIUM-HCTZ 50-12.5 MG PO TABS: 1.0000 | ORAL_TABLET | Freq: Every day | ORAL | 0 refills | Status: DC

## 2023-05-28 NOTE — Progress Notes (Signed)
Remote pacemaker transmission.   

## 2023-06-05 ENCOUNTER — Other Ambulatory Visit: Payer: Self-pay | Admitting: Internal Medicine

## 2023-06-19 DIAGNOSIS — U071 COVID-19: Secondary | ICD-10-CM

## 2023-06-19 HISTORY — DX: COVID-19: U07.1

## 2023-06-23 ENCOUNTER — Telehealth: Payer: Self-pay | Admitting: *Deleted

## 2023-06-23 NOTE — Telephone Encounter (Signed)
FYI - Patient tested positive for COVID, patient advised to hold methotrexate 2 weeks after symptoms resolve, patient verbalized understanding, patient advised to contact PCP for RX for COVID and pain medication.

## 2023-07-01 NOTE — Progress Notes (Unsigned)
Office Visit Note  Patient: [name]             Date of Birth: [birth year]           MRN: [id number]             PCP: Shon Hale, MD Referring: Shon Hale, * Visit Date: 07/15/2023 Occupation: @GUAROCC @  Subjective:  Medication monitoring   History of Present Illness: Neyla Pestka is a 77 y.o. female with history of seropositive rheumatoid arthritis and osteoarthritis.  Patient remains on Methotrexate 6 tablets by mouth once weekly, folic acid 1 mg by mouth daily, and hydroxychloroquine 200 mg daily Monday to Friday.  She is tolerating combination therapy without any side effects.  Patient states that she was recently diagnosed with COVID-19 infection and held methotrexate for 2 weeks.  She states that she continues to have some residual wheezing which is typical for her but states that her other symptoms have resolved.  She denies any recurrent infections since her last office visit.  She states that her SI joint pain has improved since having a cortisone injection on 05/14/2023.  She continues to have some stiffness in both shoulder joints as well as intermittent discomfort in the right elbow with activity.  She denies any joint swelling at this time.      Activities of Daily Living:  Patient reports morning stiffness for 0 minutes.   Patient Denies nocturnal pain.  Difficulty dressing/grooming: Denies Difficulty climbing stairs: Denies Difficulty getting out of chair: Denies Difficulty using hands for taps, buttons, cutlery, and/or writing: Reports  Review of Systems  Constitutional:  Negative for fatigue.  HENT:  Positive for mouth dryness. Negative for mouth sores.   Eyes:  Positive for dryness.  Respiratory:  Positive for cough, shortness of breath and wheezing.   Cardiovascular:  Negative for chest pain and palpitations.  Gastrointestinal:  Negative for blood in stool, constipation and diarrhea.  Endocrine: Negative for increased urination.   Genitourinary:  Negative for involuntary urination.  Musculoskeletal:  Positive for joint pain, joint pain, myalgias, muscle weakness, muscle tenderness and myalgias. Negative for gait problem, joint swelling and morning stiffness.  Skin:  Negative for color change, rash, hair loss and sensitivity to sunlight.  Allergic/Immunologic: Negative for susceptible to infections.  Neurological:  Negative for dizziness and headaches.  Hematological:  Negative for swollen glands.  Psychiatric/Behavioral:  Positive for depressed mood. Negative for sleep disturbance. The patient is nervous/anxious.     PMFS History:  Patient Active Problem List   Diagnosis Date Noted   ILD (interstitial lung disease) (HCC) 01/21/2023   Acute respiratory failure with hypoxia (HCC) 02/24/2022   GERD (gastroesophageal reflux disease) 02/24/2022   Elevated troponin 02/24/2022   Bilateral carpal tunnel syndrome 04/14/2018   Cervical spinal stenosis 01/07/2018   Cervical radiculopathy 01/07/2018   Stenosis of left subclavian artery (HCC) 12/15/2017   History of gastroesophageal reflux (GERD) 06/25/2017   History of bilateral carpal tunnel release 06/25/2017   Former smoker 01/25/2017   Rheumatoid arthritis involving multiple sites with positive rheumatoid factor (HCC) 01/17/2017   High risk medication use 01/17/2017   Primary osteoarthritis of both hands 01/17/2017   Primary osteoarthritis of both feet 01/17/2017   DJD (degenerative joint disease), cervical 01/17/2017   Spondylosis of lumbar region without myelopathy or radiculopathy 01/17/2017   Cardiomyopathy, ischemic 07/07/2016   CAD- S/P LAD thrombectomy 07/04/16 07/07/2016   Pacemaker-MDT BiV placed 07/05/16 07/07/2016   History of rheumatoid arthritis 07/07/2016  Complete heart block (HCC) 07/04/2016   Essential hypertension 07/04/2016   LBBB (left bundle branch block) 07/04/2016   Exostosis 06/18/2011   History of breast cancer 02/16/2010   Vitamin D  deficiency 02/15/2010   HYPERCHOLESTEROLEMIA 02/15/2010   Anxiety state 02/15/2010   Asthma 02/15/2010   MIGRAINES, HX OF 02/15/2010   MASTECTOMY, LEFT, HX OF 02/15/2010    Past Medical History:  Diagnosis Date   Anxiety    Asthma    Breast cancer (HCC)    left breast   Carpal tunnel syndrome    COVID-19 06/2023   Depression    Habitual alcohol use    Heart attack (HCC)    Hypertension    Interstitial lung disease (HCC)    Personal history of chemotherapy    Personal history of radiation therapy    RA (rheumatoid arthritis) (HCC)    Spinal stenosis    Vision abnormalities     Family History  Problem Relation Age of Onset   Stroke Mother    Cancer Father        colon?   Asthma Father    Pyelonephritis Sister    Pulmonary fibrosis Sister    Cancer Paternal Aunt    Cancer Paternal Uncle    Asthma Paternal Grandfather    Past Surgical History:  Procedure Laterality Date   BREAST BIOPSY     BREAST LUMPECTOMY  2005   left   CARDIAC CATHETERIZATION N/A 07/04/2016   Procedure: Left Heart Cath and Coronary Angiography;  Surgeon: Lyn Records, MD;  Location: [facility] INVASIVE CV LAB;  Service: Cardiovascular;  Laterality: N/A;   CARDIAC CATHETERIZATION N/A 07/04/2016   Procedure: Temporary Pacemaker;  Surgeon: Lyn Records, MD;  Location: [facility] INVASIVE CV LAB;  Service: Cardiovascular;  Laterality: N/A;   CARPAL TUNNEL RELEASE     CATARACT EXTRACTION Bilateral    EP IMPLANTABLE DEVICE N/A 07/05/2016   Procedure: Pacemaker Implant;  Surgeon: Marinus Maw, MD;  Location: [facility] INVASIVE CV LAB;  Service: Cardiovascular;  Laterality: N/A;   herniated rectum repair   11/2020   LAMINECTOMY  1966   LYMPHECTOMY  2005   Social History   Social History Narrative   Not on file   Immunization History  Administered Date(s) Administered   Fluad Quad(high Dose 65+) 08/16/2019, 08/05/2022   Influenza Split 09/10/2007, 09/05/2009, 08/05/2011, 09/15/2012, 08/12/2013   Influenza Whole  08/21/2009, 08/29/2010   Influenza, High Dose Seasonal PF 08/30/2021   Influenza,inj,Quad PF,6+ Mos 08/21/2016, 09/24/2017, 07/27/2018, 08/21/2020   Influenza,inj,quad, With Preservative 08/01/2014, 08/16/2015   PFIZER(Purple Top)SARS-COV-2 Vaccination 01/14/2020, 02/08/2020, 08/08/2020, 04/09/2021   Pneumococcal Conjugate-13 08/01/2014   Pneumococcal Polysaccharide-23 05/08/2009, 04/23/2022   Td 01/26/2018   Tdap 05/06/2007   Zoster, Live 06/16/2009     Objective: Vital Signs: BP 133/65 (BP Location: Right Arm, Patient Position: Sitting, Cuff Size: Normal)   Pulse 80   Resp 16   Ht 5\' 2"  (1.575 m)   Wt 122 lb 3.2 oz (55.4 kg)   BMI 22.35 kg/m    Physical Exam Vitals and nursing note reviewed.  Constitutional:      Appearance: She is well-developed.  HENT:     Head: Normocephalic and atraumatic.  Eyes:     Conjunctiva/sclera: Conjunctivae normal.  Cardiovascular:     Rate and Rhythm: Normal rate and regular rhythm.     Heart sounds: Normal heart sounds.  Pulmonary:     Effort: Pulmonary effort is normal.     Breath sounds: Wheezing present.  Abdominal:     General: Bowel sounds are normal.     Palpations: Abdomen is soft.  Musculoskeletal:     Cervical back: Normal range of motion.  Skin:    General: Skin is warm and dry.     Capillary Refill: Capillary refill takes less than 2 seconds.  Neurological:     Mental Status: She is alert and oriented to person, place, and time.  Psychiatric:        Behavior: Behavior normal.      Musculoskeletal Exam: C-spine has limited range of motion especially with lateral rotation.  Thoracic kyphosis noted. Discomfort is range of motion and stiffness with both shoulders with full abduction.  Right elbow flexion contracture noted.  Wrist joints, MCPs, PIPs, DIPs have good range of motion with no synovitis.  PIP and DIP thickening noted.  No synovitis noted.  Hip joints have good range of motion with no groin pain.  Knee joints have  good range of motion no warmth or effusion.  Ankle joints have good range of motion with no tenderness or joint swelling.  CDAI Exam: CDAI Score: -- Patient Global: 50 / 100; Provider Global: 50 / 100 Swollen: --; Tender: -- Joint Exam 07/15/2023   No joint exam has been documented for this visit   There is currently no information documented on the homunculus. Go to the Rheumatology activity and complete the homunculus joint exam.  Investigation: No additional findings.  Imaging: No results found.  Recent Labs: Lab Results  Component Value Date   WBC 7.3 03/27/2023   HGB 13.9 03/27/2023   PLT 322 03/27/2023   NA 139 03/27/2023   K 4.1 03/27/2023   CL 101 03/27/2023   CO2 30 03/27/2023   GLUCOSE 65 03/27/2023   BUN 17 03/27/2023   CREATININE 0.52 (L) 03/27/2023   BILITOT 0.6 03/27/2023   ALKPHOS 76 11/14/2022   AST 25 03/27/2023   ALT 15 03/27/2023   PROT 6.2 03/27/2023   ALBUMIN 3.2 (L) 11/14/2022   CALCIUM 9.2 03/27/2023   GFRAA 108 12/26/2020    Speciality Comments: PLQ Eye Exam: 04/21/2023 WNL @ [facility] f/u 12  months.  Procedures:  No procedures performed Allergies: Betadine [povidone iodine] and Hydrocodone     Assessment / Plan:     Visit Diagnoses: Rheumatoid arthritis involving multiple sites with positive rheumatoid factor (HCC): She has no synovitis on examination today.  She experiences intermittent stiffness in both shoulders as well as intermittent discomfort in her right elbow which has a flexion contracture.  She has been taking methotrexate 6 tablets by mouth once weekly along with Plaquenil 200 mg 1 tablet Monday through Friday.  She recently had a gap of methotrexate for 2 weeks while recovering from COVID-19 infection.  She has not had any recurrent infections.  Overall she feels that her symptoms have been stable on the current treatment regimen.  Her hand pain and inflammation has improved since increasing the dose of  methotrexate in April 2024.  She has not noticed any new or worsening pulmonary symptoms.  She will remain on combination therapy as prescribed.  She was vies notify us if she develop signs or symptoms of a flare.  She will follow-up in the office in 3 months or sooner if needed.  High risk medication use - Methotrexate 6 tablets by mouth once weekly, folic acid 1 mg by mouth daily, and hydroxychloroquine 200 mg  daily Monday to Friday.  CBC and CMP updated on 03/27/23. Orders  for CBC and CMP released today.  Her next lab work will be due in November and every 3 months.  Standing orders for CBC and CMP placed today.   Patient hold methotrexate for 2 weeks while recovering from a recent COVID-19 infection.  Discussed the importance of holding methotrexate if she develops signs or symptoms of an infection and to resume once the infection has completely cleared.  Encourage patient to get the annual flu shot this fall and to remain up-to-date with recommended vaccines. - Plan: CBC with Differential/Platelet, COMPLETE METABOLIC PANEL WITH GFR, CBC with Differential/Platelet, COMPLETE METABOLIC PANEL WITH GFR  ILD (interstitial lung disease) (HCC): Probable early UIP related to underlying RA- High-resolution chest CT 05/23/2022. Evaluated at [facility] on 01/21/23-Dyspnea with exertion and intermittent wheezing. CT chest in July 2023 suggestive of interstitial lung disease. 10% decrease in lung function. CT: 02/07/23: Mild subpleural fibrosis without a definite zonal predominance and questionable single layer honeycombing. Findings are categorized as probable UIP per consensus guidelines: Diagnosis of Idiopathic Pulmonary Fibrosis. Evaluated by Dr. Celine Mans 02/26/23-there was discussion of remaining on methotrexate and Plaquenil from a pulmonary standpoint and she was okay with increasing the dose of methotrexate if needed.  There was also discussion of possibly changing to CellCept, Imuran, and rituximab in the future  if needed. The patient remains apprehensive of making any drastic medication changes.  The dose of methotrexate was increased from 4 tablets to 6 tablets weekly in mid-April. She has not noticed any new or worsening pulmonary symptoms.   Advised patient to schedule routine follow up with Dr. Celine Mans.   Elevated LFTs -LFTs within normal limits on 03/13/2023.  CMP updated today.  Plan: COMPLETE METABOLIC PANEL WITH GFR  Arthritis of right acromioclavicular joint: Stiffness noted in the right shoulder.    Contracture of right elbow: Flexion contracture noted in the right elbow.  Patient has intermittent discomfort and stiffness in the right elbow especially with activity.  She had no tenderness or synovitis on examination today.  Primary osteoarthritis of both hands: PIP and DIP thickening consistent with osteoarthritis of both hands.  No synovitis noted on examination today.  Trigger finger, right middle finger: Resolved.   Primary osteoarthritis of right knee:  Good ROM of the right knee with no warmth or effusion.  Trochanteric bursitis, left hip: Not currently symptomatic.   Primary osteoarthritis of both feet: Good ROM of both ankle joints with no tenderness or joint swelling.  She is wearing proper fitting shoes.   DDD (degenerative disc disease), cervical: Limited ROM of the C-spine. Thoracic kyphosis noted.   DDD (degenerative disc disease), lumbar: No midline spinal tenderness.   Chronic left SI joint pain: Patient had a left SI joint cortisone injection performed on 05/14/2023 which provided significant relief.  She has no tenderness upon palpation today.  History of vitamin D deficiency: She is taking vitamin D 2000 units daily.   Other medical conditions are listed as follows:  History of gastroesophageal reflux (GERD)  History of hyperlipidemia  History of anxiety  History of breast cancer  History of ventricular tachycardia  Former smoker  History of asthma  History  of hypertension: Blood pressure was 133/65 today in the office.  History of left bundle branch block  Orders: Orders Placed This Encounter  Procedures   CBC with Differential/Platelet   COMPLETE METABOLIC PANEL WITH GFR   CBC with Differential/Platelet   COMPLETE METABOLIC PANEL WITH GFR   No orders of the defined types were placed in  this encounter.   Follow-Up Instructions: Return in about 3 months (around 10/15/2023) for Rheumatoid arthritis.   Gearldine Bienenstock, PA-C  Note - This record has been created using Dragon software.  Chart creation errors have been sought, but may not always  have been located. Such creation errors do not reflect on  the standard of medical care.

## 2023-07-07 ENCOUNTER — Other Ambulatory Visit: Payer: Self-pay | Admitting: Physician Assistant

## 2023-07-07 DIAGNOSIS — M0579 Rheumatoid arthritis with rheumatoid factor of multiple sites without organ or systems involvement: Secondary | ICD-10-CM

## 2023-07-07 DIAGNOSIS — Z79899 Other long term (current) drug therapy: Secondary | ICD-10-CM

## 2023-07-07 DIAGNOSIS — R7989 Other specified abnormal findings of blood chemistry: Secondary | ICD-10-CM

## 2023-07-07 DIAGNOSIS — J849 Interstitial pulmonary disease, unspecified: Secondary | ICD-10-CM

## 2023-07-07 NOTE — Telephone Encounter (Signed)
Last Fill: 03/17/2023  Labs: 03/27/2023 CBC WNL. LFTs are WNL.  Creatinine is borderline low likely due to hydration status. GFR WNL. Rest of CMP WNL  Next Visit: 07/15/2023  Last Visit: 05/08/2023  DX: Rheumatoid arthritis involving multiple sites with positive rheumatoid factor   Current Dose per office note 05/08/2023: Methotrexate 6 tablets by mouth once weekly   Patient to update labs at upcoming appointment on 07/15/2023.   Okay to refill Methotrexate?

## 2023-07-08 ENCOUNTER — Ambulatory Visit: Payer: Medicare Other | Attending: Internal Medicine | Admitting: Internal Medicine

## 2023-07-08 ENCOUNTER — Encounter: Payer: Self-pay | Admitting: Internal Medicine

## 2023-07-08 VITALS — BP 158/62 | HR 76 | Ht 62.0 in | Wt 121.6 lb

## 2023-07-08 DIAGNOSIS — I2510 Atherosclerotic heart disease of native coronary artery without angina pectoris: Secondary | ICD-10-CM | POA: Diagnosis not present

## 2023-07-08 NOTE — Progress Notes (Unsigned)
Cardiology Office Note:    Date:  07/08/2023   ID:  [name], [name] July 23, 1946, MRN [id number]  PCP:  Shon Hale, MD  Cardiologist:  None   Referring MD: Shon Hale, *   No chief complaint on file.   History of Present Illness:    [name] is a 77 y.o. female with a hx of breast Ca, HTN, RA, depression, CAD s/p thrombotic occlusion of the LAD s/p thrombectomy (07/04/16), VT, and CHB s/p BiV PPM . LVEF 55% by echo 2018.  Saw Dr. Ladona Ridgel in November.  Was doing well at that time.  No changes were made.  An echo was done in April during acute respiratory failure and revealed an EF of 55 to 60%.  Her pulmonary status has improved.  She is back to independent living.  She is not limited by physical activity.  She has biventricular permanent pacemaker which has led to significant improvement in LV function from 35 to 40% to 55% 2018 and greater than 55% most recently in 2023.  She does have occasional lower extremity swelling.  Has obvious varicose veins on her legs.   She was previously followd by Mendel Ryder  last seen in June 2023  She also follows with EP Ladona Ridgel)   Pt says with exertion breathing isnt good   Not worse    Used to walk a distance every day   Now 1/2 way      Aerobics 2x per week   Does OK   Past Medical History:  Diagnosis Date   Anxiety    Asthma    Breast cancer (HCC)    left breast   Carpal tunnel syndrome    Depression    Habitual alcohol use    Heart attack (HCC)    Hypertension    Interstitial lung disease (HCC)    Personal history of chemotherapy    Personal history of radiation therapy    RA (rheumatoid arthritis) (HCC)    Spinal stenosis    Vision abnormalities     Past Surgical History:  Procedure Laterality Date   BREAST BIOPSY     BREAST LUMPECTOMY  2005   left   CARDIAC CATHETERIZATION N/A 07/04/2016   Procedure: Left Heart Cath and Coronary Angiography;  Surgeon: Lyn Records, MD;  Location: [facility] INVASIVE CV  LAB;  Service: Cardiovascular;  Laterality: N/A;   CARDIAC CATHETERIZATION N/A 07/04/2016   Procedure: Temporary Pacemaker;  Surgeon: Lyn Records, MD;  Location: [facility] INVASIVE CV LAB;  Service: Cardiovascular;  Laterality: N/A;   CARPAL TUNNEL RELEASE     CATARACT EXTRACTION Bilateral    EP IMPLANTABLE DEVICE N/A 07/05/2016   Procedure: Pacemaker Implant;  Surgeon: Marinus Maw, MD;  Location: [facility] INVASIVE CV LAB;  Service: Cardiovascular;  Laterality: N/A;   herniated rectum repair   11/2020   LAMINECTOMY  1966   LYMPHECTOMY  2005    Current Medications: Current Meds  Medication Sig   acetaminophen (TYLENOL) 325 MG tablet Take 325-650 mg by mouth every 6 (six) hours as needed for mild pain, headache or fever.   albuterol (PROVENTIL HFA;VENTOLIN HFA) 108 (90 Base) MCG/ACT inhaler Inhale 2 puffs into the lungs every 6 (six) hours as needed for wheezing or shortness of breath.   aspirin 81 MG tablet Take 81 mg by mouth daily.   atorvastatin (LIPITOR) 40 MG tablet TAKE 1 TABLET(40 MG) BY MOUTH DAILY AT 6 PM   calcium carbonate (OS-CAL) 600 MG TABS Take  600 mg by mouth 2 (two) times daily with a meal.   Cholecalciferol (VITAMIN D3) 50 MCG (2000 UT) TABS Take 2,000 Units by mouth daily.   citalopram (CELEXA) 20 MG tablet Take 20 mg by mouth daily.   clonazePAM (KLONOPIN) 0.5 MG tablet Take 0.25-0.5 mg by mouth daily as needed for anxiety.   fluticasone-salmeterol (ADVAIR HFA) 115-21 MCG/ACT inhaler Inhale 2 puffs into the lungs 2 (two) times daily.   folic acid (FOLVITE) 1 MG tablet TAKE 1 TABLET BY MOUTH DAILY   hydroxychloroquine (PLAQUENIL) 200 MG tablet TAKE 1 TABLET BY MOUTH DAILY MONDAY-FRIDAY   losartan-hydrochlorothiazide (HYZAAR) 50-12.5 MG tablet Take 1 tablet by mouth daily.   melatonin 3 MG TABS tablet Take 3 mg by mouth at bedtime as needed (for sleep).   methotrexate (RHEUMATREX) 2.5 MG tablet TAKE 6 TABLETS BY MOUTH 1 TIME WEEKLY   nitroGLYCERIN (NITROSTAT) 0.4 MG SL tablet  Place 1 tablet (0.4 mg total) under the tongue every 5 (five) minutes as needed for chest pain.   pantoprazole (PROTONIX) 40 MG tablet TAKE 1 TABLET(40 MG) BY MOUTH DAILY   traZODone (DESYREL) 50 MG tablet Take 50 mg by mouth at bedtime as needed for sleep.     Allergies:   Betadine [povidone iodine] and Hydrocodone   Social History   Socioeconomic History   Marital status: Widowed    Spouse name: Not on file   Number of children: Not on file   Years of education: Not on file   Highest education level: Not on file  Occupational History   Not on file  Tobacco Use   Smoking status: Former    Current packs/day: 0.00    Average packs/day: 2.0 packs/day for 25.0 years (50.0 ttl pk-yrs)    Types: Cigarettes    Start date: 56    Quit date: 73    Years since quitting: 29.6    Passive exposure: Past   Smokeless tobacco: Never  Vaping Use   Vaping status: Never Used  Substance and Sexual Activity   Alcohol use: Yes    Alcohol/week: 14.0 standard drinks of alcohol    Types: 14 Cans of beer per week    Comment: 2 DAILY   Drug use: Never   Sexual activity: Not on file  Other Topics Concern   Not on file  Social History Narrative   Not on file   Social Determinants of Health   Financial Resource Strain: Not on file  Food Insecurity: Not on file  Transportation Needs: Not on file  Physical Activity: Not on file  Stress: Not on file  Social Connections: Not on file     Family History: The patient's family history includes Asthma in her father and paternal grandfather; Cancer in her father, paternal aunt, and paternal uncle; Pulmonary fibrosis in her sister; Pyelonephritis in her sister; Stroke in her mother.  ROS:   Please see the history of present illness.    She has no current complaints.  She is in [place] for the summer.  She is having no exertional intolerance.  She is able to sleep laying flat all other systems reviewed and are negative.  EKGs/Labs/Other  Studies Reviewed:    The following studies were reviewed today:  2 D Doppler ECHOCARDIOGRAM 02/24/2022: IMPRESSIONS   1. Left ventricular ejection fraction, by estimation, is 55 to 60%. The  left ventricle has normal function. The left ventricle has no regional  wall motion abnormalities. Left ventricular diastolic function could not  be  evaluated.   2. Right ventricular systolic function is normal. The right ventricular  size is normal. There is normal pulmonary artery systolic pressure. The  estimated right ventricular systolic pressure is 32.6 mmHg.   3. Left atrial size was mild to moderately dilated.   4. The mitral valve is degenerative. Mild mitral valve regurgitation. No  evidence of mitral stenosis. Moderate to severe mitral annular  calcification.   5. The aortic valve is tricuspid. Aortic valve regurgitation is not  visualized. No aortic stenosis is present.   6. The inferior vena cava is dilated in size with >50% respiratory  variability, suggesting right atrial pressure of 8 mmHg.   EKG:  EKG performed April 2023 demonstrated atrial tracking with ventricular pacing.  Pacemaker V spikes have significant amplitude.  Recent Labs: 11/14/2022: B Natriuretic Peptide 168.2; Magnesium 1.8 03/27/2023: ALT 15; BUN 17; Creat 0.52; Hemoglobin 13.9; Platelets 322; Potassium 4.1; Sodium 139  Recent Lipid Panel    Component Value Date/Time   CHOL 166 07/05/2016 0313   TRIG 61 02/23/2022 1647   HDL 54 07/05/2016 0313   CHOLHDL 3.1 07/05/2016 0313   VLDL 18 07/05/2016 0313   LDLCALC 94 07/05/2016 0313    Physical Exam:    VS:  BP (!) 158/62   Pulse 76   Ht 5\' 2"  (1.575 m)   Wt 121 lb 9.6 oz (55.2 kg)   SpO2 97%   BMI 22.24 kg/m     Wt Readings from Last 3 Encounters:  07/08/23 121 lb 9.6 oz (55.2 kg)  05/08/23 122 lb (55.3 kg)  02/27/23 126 lb 3.2 oz (57.2 kg)     GEN: Healthy appearing. No acute distress HEENT: Normal NECK: No JVD. LYMPHATICS: No  lymphadenopathy CARDIAC: No murmur. RRR no gallop, or edema. VASCULAR: No edema normal Pulses. No bruits.  Bilateral lower extremities have skin discoloration has evidence of prior substantial edema. RESPIRATORY:  Clear to auscultation without rales, wheezing or rhonchi  ABDOMEN: Soft, non-tender, non-distended, No pulsatile mass, MUSCULOSKELETAL: No deformity  SKIN: Warm and dry NEUROLOGIC:  Alert and oriented x 3 PSYCHIATRIC:  Normal affect   ASSESSMENT:    No diagnosis found.  PLAN:    In order of problems listed above:  Iatrogenic LAD occlusion treated with catheter-based thrombectomy with improvement.  LV function has again been demonstrated to be normal by recent echo. Complete heart block is being treated with biventricular permanent pacemaker. Treated with biventricular pacing Normal function when last seen by the device clinic Resolved with medical therapy and BiV pacing.  Clinical follow-up in 1 year.   Medication Adjustments/Labs and Tests Ordered: Current medicines are reviewed at length with the patient today.  Concerns regarding medicines are outlined above.  No orders of the defined types were placed in this encounter.  No orders of the defined types were placed in this encounter.   There are no Patient Instructions on file for this visit.   Signed, Dietrich Pates, MD  07/08/2023 11:40 AM    [facility]

## 2023-07-08 NOTE — Patient Instructions (Signed)
Medication Instructions:   *If you need a refill on your cardiac medications before your next appointment, please call your pharmacy*   Lab Work:  If you have labs (blood work) drawn today and your tests are completely normal, you will receive your results only by: MyChart Message (if you have MyChart) OR A paper copy in the mail If you have any lab test that is abnormal or we need to change your treatment, we will call you to review the results.   Testing/Procedures:    Follow-Up: At [facility], you and your health needs are our priority.  As part of our continuing mission to provide you with exceptional heart care, we have created designated Provider Care Teams.  These Care Teams include your primary Cardiologist (physician) and Advanced Practice Providers (APPs -  Physician Assistants and Nurse Practitioners) who all work together to provide you with the care you need, when you need it.  We recommend signing up for the patient portal called "MyChart".  Sign up information is provided on this After Visit Summary.  MyChart is used to connect with patients for Virtual Visits (Telemedicine).  Patients are able to view lab/test results, encounter notes, upcoming appointments, etc.  Non-urgent messages can be sent to your provider as well.   To learn more about what you can do with MyChart, go to [email or web address].    Your next appointment:   1 year(s)  Provider:  Dr Dietrich Pates

## 2023-07-09 DIAGNOSIS — D225 Melanocytic nevi of trunk: Secondary | ICD-10-CM | POA: Diagnosis not present

## 2023-07-09 DIAGNOSIS — B353 Tinea pedis: Secondary | ICD-10-CM | POA: Diagnosis not present

## 2023-07-09 DIAGNOSIS — Z08 Encounter for follow-up examination after completed treatment for malignant neoplasm: Secondary | ICD-10-CM | POA: Diagnosis not present

## 2023-07-09 DIAGNOSIS — L820 Inflamed seborrheic keratosis: Secondary | ICD-10-CM | POA: Diagnosis not present

## 2023-07-09 DIAGNOSIS — L821 Other seborrheic keratosis: Secondary | ICD-10-CM | POA: Diagnosis not present

## 2023-07-09 DIAGNOSIS — Z85828 Personal history of other malignant neoplasm of skin: Secondary | ICD-10-CM | POA: Diagnosis not present

## 2023-07-09 DIAGNOSIS — L814 Other melanin hyperpigmentation: Secondary | ICD-10-CM | POA: Diagnosis not present

## 2023-07-09 DIAGNOSIS — L538 Other specified erythematous conditions: Secondary | ICD-10-CM | POA: Diagnosis not present

## 2023-07-15 ENCOUNTER — Encounter: Payer: Self-pay | Admitting: Physician Assistant

## 2023-07-15 ENCOUNTER — Ambulatory Visit: Payer: Medicare Other | Attending: Physician Assistant | Admitting: Physician Assistant

## 2023-07-15 VITALS — BP 133/65 | HR 80 | Resp 16 | Ht 62.0 in | Wt 122.2 lb

## 2023-07-15 DIAGNOSIS — R7989 Other specified abnormal findings of blood chemistry: Secondary | ICD-10-CM

## 2023-07-15 DIAGNOSIS — M7062 Trochanteric bursitis, left hip: Secondary | ICD-10-CM | POA: Diagnosis not present

## 2023-07-15 DIAGNOSIS — Z87891 Personal history of nicotine dependence: Secondary | ICD-10-CM

## 2023-07-15 DIAGNOSIS — Z853 Personal history of malignant neoplasm of breast: Secondary | ICD-10-CM

## 2023-07-15 DIAGNOSIS — Z8719 Personal history of other diseases of the digestive system: Secondary | ICD-10-CM

## 2023-07-15 DIAGNOSIS — M19011 Primary osteoarthritis, right shoulder: Secondary | ICD-10-CM

## 2023-07-15 DIAGNOSIS — Z8679 Personal history of other diseases of the circulatory system: Secondary | ICD-10-CM

## 2023-07-15 DIAGNOSIS — M19041 Primary osteoarthritis, right hand: Secondary | ICD-10-CM | POA: Diagnosis not present

## 2023-07-15 DIAGNOSIS — M5030 Other cervical disc degeneration, unspecified cervical region: Secondary | ICD-10-CM | POA: Diagnosis not present

## 2023-07-15 DIAGNOSIS — M19072 Primary osteoarthritis, left ankle and foot: Secondary | ICD-10-CM

## 2023-07-15 DIAGNOSIS — M19042 Primary osteoarthritis, left hand: Secondary | ICD-10-CM

## 2023-07-15 DIAGNOSIS — Z8659 Personal history of other mental and behavioral disorders: Secondary | ICD-10-CM

## 2023-07-15 DIAGNOSIS — M19071 Primary osteoarthritis, right ankle and foot: Secondary | ICD-10-CM | POA: Diagnosis not present

## 2023-07-15 DIAGNOSIS — M65331 Trigger finger, right middle finger: Secondary | ICD-10-CM

## 2023-07-15 DIAGNOSIS — G8929 Other chronic pain: Secondary | ICD-10-CM

## 2023-07-15 DIAGNOSIS — M5136 Other intervertebral disc degeneration, lumbar region: Secondary | ICD-10-CM

## 2023-07-15 DIAGNOSIS — J849 Interstitial pulmonary disease, unspecified: Secondary | ICD-10-CM | POA: Diagnosis not present

## 2023-07-15 DIAGNOSIS — Z8709 Personal history of other diseases of the respiratory system: Secondary | ICD-10-CM

## 2023-07-15 DIAGNOSIS — M51369 Other intervertebral disc degeneration, lumbar region without mention of lumbar back pain or lower extremity pain: Secondary | ICD-10-CM

## 2023-07-15 DIAGNOSIS — M24521 Contracture, right elbow: Secondary | ICD-10-CM

## 2023-07-15 DIAGNOSIS — M0579 Rheumatoid arthritis with rheumatoid factor of multiple sites without organ or systems involvement: Secondary | ICD-10-CM

## 2023-07-15 DIAGNOSIS — Z79899 Other long term (current) drug therapy: Secondary | ICD-10-CM

## 2023-07-15 DIAGNOSIS — Z8639 Personal history of other endocrine, nutritional and metabolic disease: Secondary | ICD-10-CM

## 2023-07-15 DIAGNOSIS — M533 Sacrococcygeal disorders, not elsewhere classified: Secondary | ICD-10-CM

## 2023-07-15 DIAGNOSIS — M1711 Unilateral primary osteoarthritis, right knee: Secondary | ICD-10-CM | POA: Diagnosis not present

## 2023-07-15 NOTE — Patient Instructions (Addendum)
 Standing Labs We placed an order today for your standing lab work.   Please have your standing labs drawn in November and every 3 months  Please have your labs drawn 2 weeks prior to your appointment so that the provider can discuss your lab results at your appointment, if possible.  Please note that you may see your imaging and lab results in MyChart before we have reviewed them. We will contact you once all results are reviewed. Please allow our office up to 72 hours to thoroughly review all of the results before contacting the office for clarification of your results.  WALK-IN LAB HOURS  Monday through Thursday from 8:00 am -12:30 pm and 1:00 pm-5:00 pm and Friday from 8:00 am-12:00 pm.  Patients with office visits requiring labs will be seen before walk-in labs.  You may encounter longer than normal wait times. Please allow additional time. Wait times may be shorter on  Monday and Thursday afternoons.  We do not book appointments for walk-in labs. We appreciate your patience and understanding with our staff.   Labs are drawn by [facility]. Please bring your co-pay at the time of your lab draw.  You may receive a bill from [facility] for your lab work.  Please note if you are on Hydroxychloroquine and and an order has been placed for a Hydroxychloroquine level,  you will need to have it drawn 4 hours or more after your last dose.  If you wish to have your labs drawn at another location, please call the office 24 hours in advance so we can fax the orders.  The office is located at [address], [place], [place] [address]   If you have any questions regarding directions or hours of operation,  please call [phone].   As a reminder, please drink plenty of water prior to coming for your lab work. Thanks!   Vaccines You are taking a medication(s) that can suppress your immune system.  The following immunizations are recommended: Flu annually Covid-19  Td/Tdap (tetanus,  diphtheria, pertussis) every 10 years Pneumonia (Prevnar 15 then Pneumovax 23 at least 1 year apart.  Alternatively, can take Prevnar 20 without needing additional dose) Shingrix: 2 doses from 4 weeks to 6 months apart  Please check with your PCP to make sure you are up to date.  If you have signs or symptoms of an infection or start antibiotics: First, call your PCP for workup of your infection. Hold your medication through the infection, until you complete your antibiotics, and until symptoms resolve if you take the following: Injectable medication (Actemra, Benlysta, Cimzia, Cosentyx, Enbrel, Humira, Kevzara, Orencia, Remicade, Simponi, Stelara, Taltz, Tremfya) Methotrexate Leflunomide (Arava) Mycophenolate (Cellcept) Harriette Ohara, Olumiant, or Rinvoq

## 2023-07-16 LAB — CBC WITH DIFFERENTIAL/PLATELET
Absolute Monocytes: 980 cells/uL — ABNORMAL HIGH (ref 200–950)
Basophils Absolute: 90 cells/uL (ref 0–200)
Basophils Relative: 1.3 %
Eosinophils Absolute: 531 {cells}/uL — ABNORMAL HIGH (ref 15–500)
Eosinophils Relative: 7.7 %
HCT: 40.8 % (ref 35.0–45.0)
Hemoglobin: 13.7 g/dL (ref 11.7–15.5)
Lymphs Abs: 1622 {cells}/uL (ref 850–3900)
MCH: 32.3 pg (ref 27.0–33.0)
MCHC: 33.6 g/dL (ref 32.0–36.0)
MCV: 96.2 fL (ref 80.0–100.0)
MPV: 9.2 fL (ref 7.5–12.5)
Monocytes Relative: 14.2 %
Neutro Abs: 3678 {cells}/uL (ref 1500–7800)
Neutrophils Relative %: 53.3 %
Platelets: 348 10*3/uL (ref 140–400)
RBC: 4.24 10*6/uL (ref 3.80–5.10)
RDW: 13 % (ref 11.0–15.0)
Total Lymphocyte: 23.5 %
WBC: 6.9 10*3/uL (ref 3.8–10.8)

## 2023-07-16 LAB — COMPLETE METABOLIC PANEL WITH GFR
AG Ratio: 1.4 (calc) (ref 1.0–2.5)
ALT: 22 U/L (ref 6–29)
AST: 33 U/L (ref 10–35)
Albumin: 3.7 g/dL (ref 3.6–5.1)
Alkaline phosphatase (APISO): 64 U/L (ref 37–153)
BUN/Creatinine Ratio: 28 (calc) — ABNORMAL HIGH (ref 6–22)
BUN: 15 mg/dL (ref 7–25)
CO2: 31 mmol/L (ref 20–32)
Calcium: 9.2 mg/dL (ref 8.6–10.4)
Chloride: 98 mmol/L (ref 98–110)
Creat: 0.54 mg/dL — ABNORMAL LOW (ref 0.60–1.00)
Globulin: 2.6 g/dL (ref 1.9–3.7)
Glucose, Bld: 64 mg/dL — ABNORMAL LOW (ref 65–99)
Potassium: 4.1 mmol/L (ref 3.5–5.3)
Sodium: 137 mmol/L (ref 135–146)
Total Bilirubin: 0.5 mg/dL (ref 0.2–1.2)
Total Protein: 6.3 g/dL (ref 6.1–8.1)
eGFR: 94 mL/min/{1.73_m2} (ref >=60)

## 2023-07-16 NOTE — Progress Notes (Signed)
Absolute monocytes and eosinophils are slightly elevated.  Patient had recent covid-19 infection.  Rest of CBC WNL.  CMP stable.  No medication changes recommended at this time.

## 2023-07-21 ENCOUNTER — Other Ambulatory Visit: Payer: Self-pay | Admitting: Physician Assistant

## 2023-07-21 DIAGNOSIS — M0579 Rheumatoid arthritis with rheumatoid factor of multiple sites without organ or systems involvement: Secondary | ICD-10-CM

## 2023-07-22 NOTE — Telephone Encounter (Signed)
Last Fill: 05/01/2023  Eye exam: 04/21/2023 WNL   Labs: 07/15/2023 Absolute monocytes and eosinophils are slightly elevated. Rest of CBC WNL. CMP stable.  Next Visit: 10/15/2023  Last Visit: 07/15/2023  DX: Rheumatoid arthritis involving multiple sites with positive rheumatoid factor   Current Dose per office note 07/15/2023: hydroxychloroquine 200 mg daily Monday to Friday.   Okay to refill Plaquenil?

## 2023-07-25 ENCOUNTER — Encounter: Payer: Self-pay | Admitting: Internal Medicine

## 2023-07-26 NOTE — Telephone Encounter (Signed)
Looks good    Keep on same meds

## 2023-08-05 ENCOUNTER — Ambulatory Visit: Payer: Medicare Other

## 2023-08-05 DIAGNOSIS — I442 Atrioventricular block, complete: Secondary | ICD-10-CM

## 2023-08-05 DIAGNOSIS — I255 Ischemic cardiomyopathy: Secondary | ICD-10-CM

## 2023-08-06 LAB — CUP PACEART REMOTE DEVICE CHECK
Battery Remaining Longevity: 28 mo
Battery Voltage: 2.94 V
Brady Statistic AP VP Percent: 0.91 %
Brady Statistic AP VS Percent: 0.03 %
Brady Statistic AS VP Percent: 99.04 %
Brady Statistic AS VS Percent: 0.03 %
Brady Statistic RA Percent Paced: 0.93 %
Brady Statistic RV Percent Paced: 99.87 %
Date Time Interrogation Session: 20240918185248
Implantable Lead Connection Status: 753985
Implantable Lead Connection Status: 753985
Implantable Lead Connection Status: 753985
Implantable Lead Implant Date: 20170818
Implantable Lead Implant Date: 20170818
Implantable Lead Implant Date: 20170818
Implantable Lead Location: 753858
Implantable Lead Location: 753859
Implantable Lead Location: 753860
Implantable Lead Model: 4396
Implantable Lead Model: 5076
Implantable Lead Model: 5076
Implantable Pulse Generator Implant Date: 20170818
Lead Channel Impedance Value: 361 Ohm
Lead Channel Impedance Value: 437 Ohm
Lead Channel Impedance Value: 456 Ohm
Lead Channel Impedance Value: 456 Ohm
Lead Channel Impedance Value: 513 Ohm
Lead Channel Impedance Value: 532 Ohm
Lead Channel Impedance Value: 551 Ohm
Lead Channel Impedance Value: 646 Ohm
Lead Channel Impedance Value: 836 Ohm
Lead Channel Pacing Threshold Amplitude: 0.5 V
Lead Channel Pacing Threshold Amplitude: 0.625 V
Lead Channel Pacing Threshold Amplitude: 0.875 V
Lead Channel Pacing Threshold Pulse Width: 0.4 ms
Lead Channel Pacing Threshold Pulse Width: 0.4 ms
Lead Channel Pacing Threshold Pulse Width: 0.4 ms
Lead Channel Sensing Intrinsic Amplitude: 1.25 mV
Lead Channel Sensing Intrinsic Amplitude: 1.25 mV
Lead Channel Sensing Intrinsic Amplitude: 4.125 mV
Lead Channel Sensing Intrinsic Amplitude: 4.125 mV
Lead Channel Setting Pacing Amplitude: 1.5 V
Lead Channel Setting Pacing Amplitude: 2 V
Lead Channel Setting Pacing Amplitude: 2 V
Lead Channel Setting Pacing Pulse Width: 0.4 ms
Lead Channel Setting Pacing Pulse Width: 0.4 ms
Lead Channel Setting Sensing Sensitivity: 0.9 mV
Zone Setting Status: 755011
Zone Setting Status: 755011

## 2023-08-08 ENCOUNTER — Telehealth: Payer: Self-pay | Admitting: Internal Medicine

## 2023-08-08 DIAGNOSIS — J849 Interstitial pulmonary disease, unspecified: Secondary | ICD-10-CM

## 2023-08-08 NOTE — Telephone Encounter (Signed)
Pt needs a refill on her Advair  [id number] - [place], [place] - [address] AT [facility] [address] [facility] [address]

## 2023-08-13 MED ORDER — FLUTICASONE-SALMETEROL 115-21 MCG/ACT IN AERO
2.0000 | INHALATION_SPRAY | Freq: Two times a day (BID) | RESPIRATORY_TRACT | 12 refills | Status: DC
Start: 2023-08-13 — End: 2024-06-23

## 2023-08-13 MED ORDER — BUDESONIDE-FORMOTEROL FUMARATE 160-4.5 MCG/ACT IN AERO
2.0000 | INHALATION_SPRAY | Freq: Two times a day (BID) | RESPIRATORY_TRACT | 6 refills | Status: DC
Start: 2023-08-13 — End: 2024-02-17

## 2023-08-13 NOTE — Telephone Encounter (Signed)
I sent in prescription for advair and sybicort per patient's request to see which would be best covered by insurance. She will let us know which one she is able to pick up.  Melody Comas, MD [facility] Office: [phone]   See Amion for personal pager PCCM on call pager [phone] until 7pm. Please call Elink 7p-7a. [phone]

## 2023-08-13 NOTE — Telephone Encounter (Signed)
Patient checking on message for Advair inhaler. Patient out of medication. Patient phone number is [phone].

## 2023-08-13 NOTE — Telephone Encounter (Signed)
Per Epic Advair is no longer on ins formulary.    Please advise on alternative rx, thank you!

## 2023-08-18 ENCOUNTER — Other Ambulatory Visit: Payer: Self-pay | Admitting: Internal Medicine

## 2023-08-21 NOTE — Progress Notes (Signed)
Remote pacemaker transmission.   

## 2023-09-21 IMAGING — MG MM BREAST BX W LOC DEV 1ST LESION IMAGE BX SPEC STEREO GUIDE*L*
6 of 11 series · 6 of 31 positions shown · non-contrast
Comparison: With priors.
COMPARISON: With priors.

Addendum:
CLINICAL DATA: Indeterminate left breast calcifications.

EXAM:
LEFT BREAST STEREOTACTIC CORE NEEDLE BIOPSY

[L (1 of 5)]
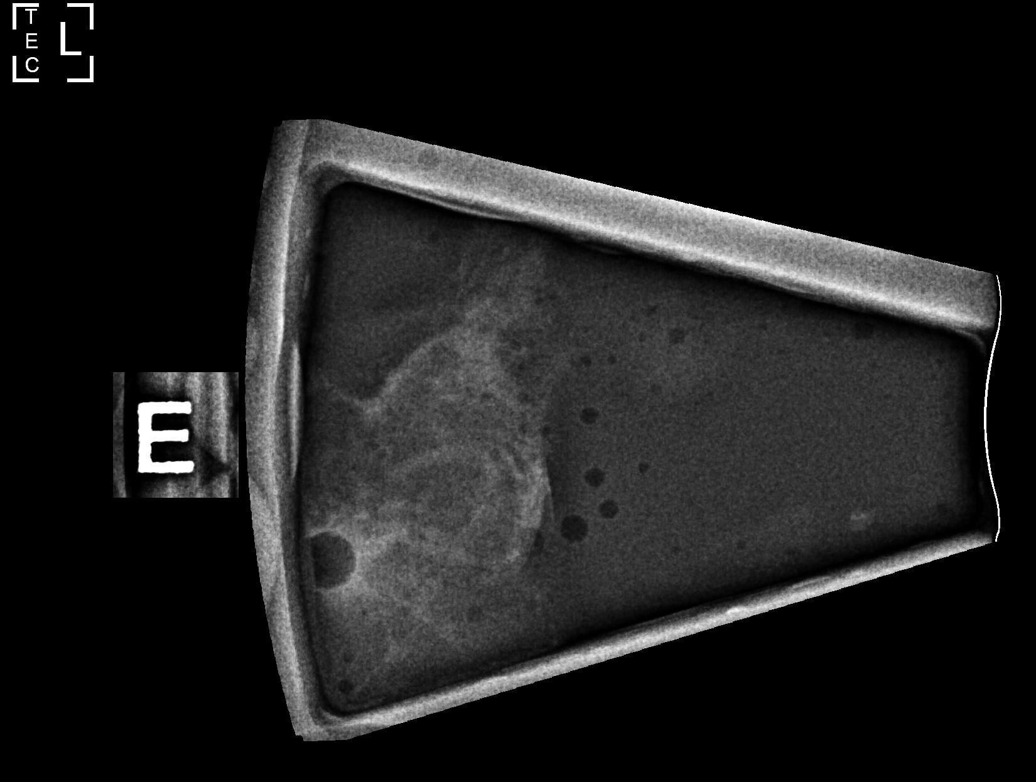

[L (2 of 5)]
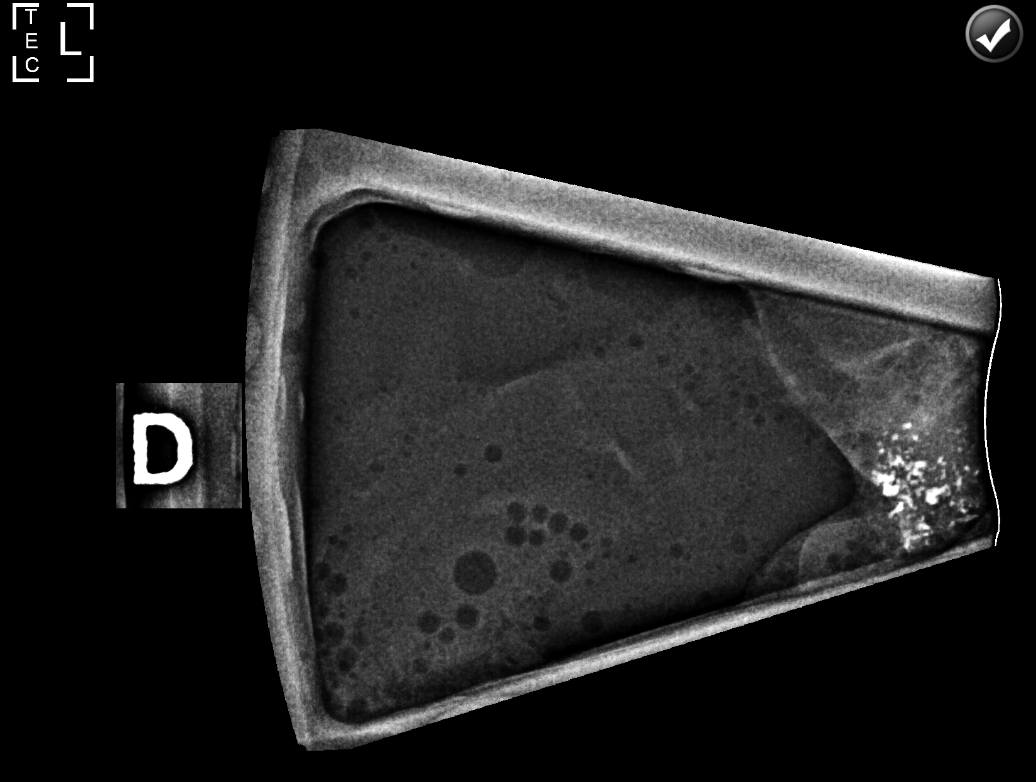

[L (3 of 5)]
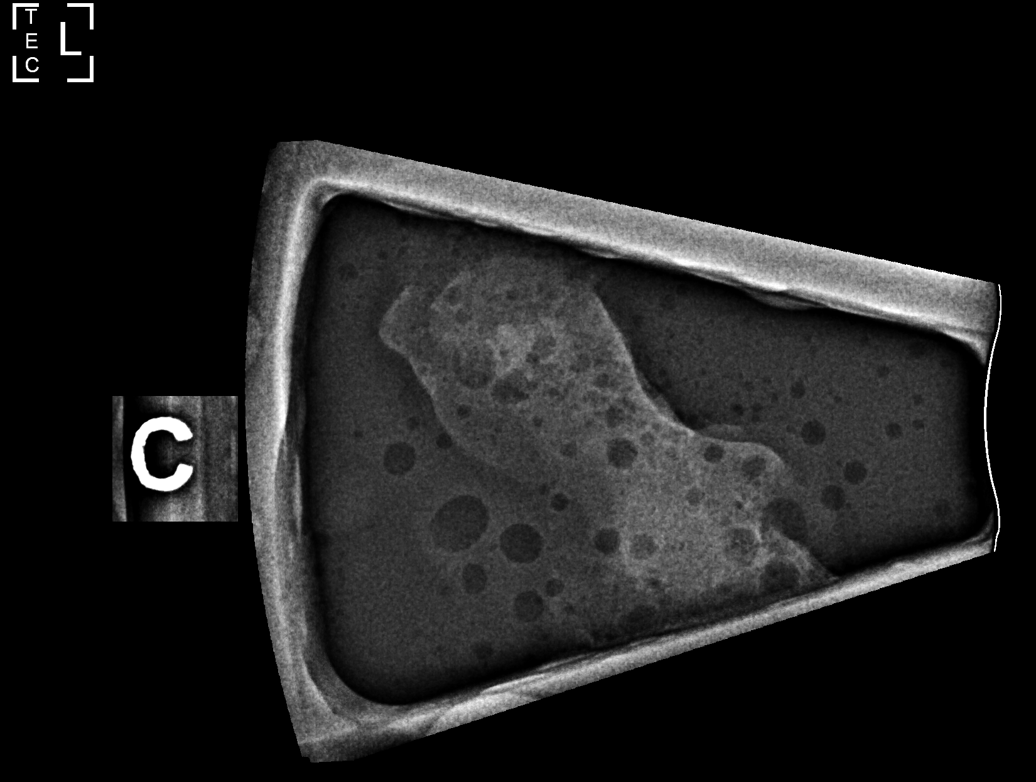

[L (4 of 5)]
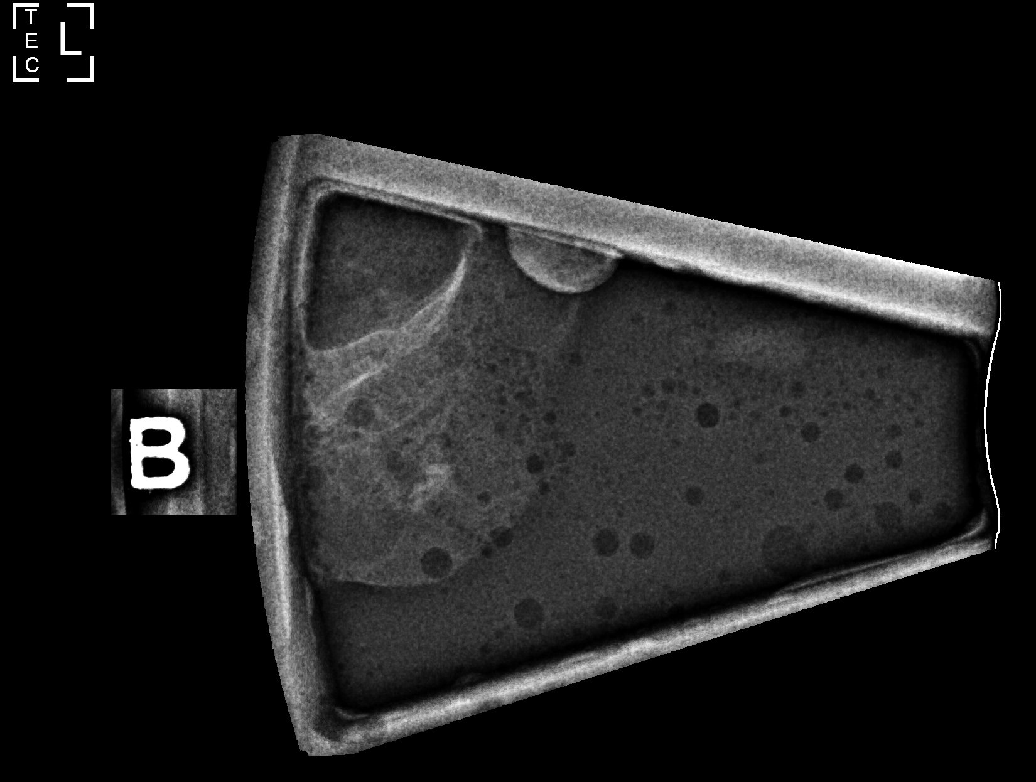

[L (5 of 5)]
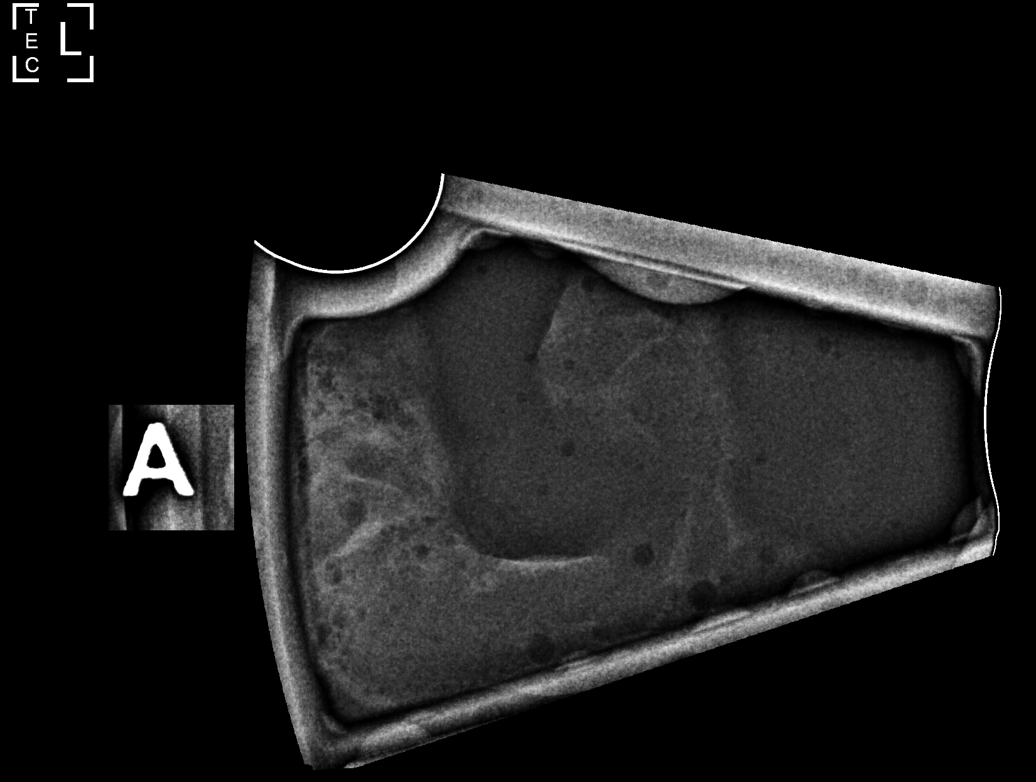

[L LM]
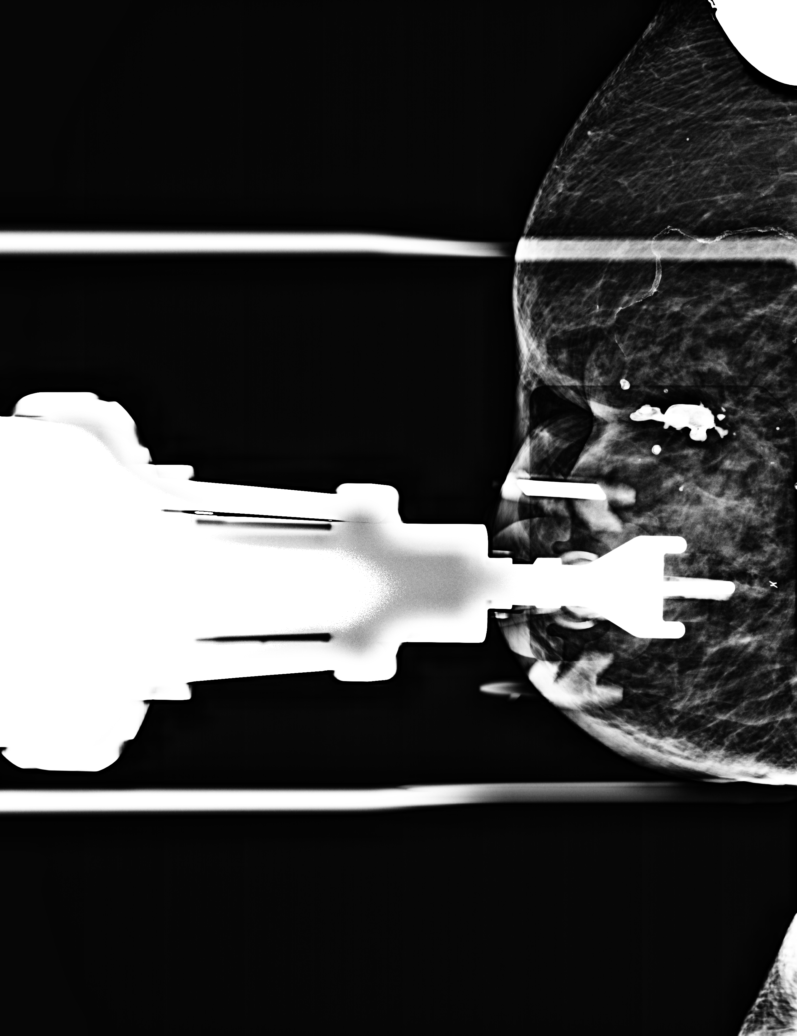

[6 of 31 positions shown; findings below may reference images not displayed]



Using sterile technique and 1% lidocaine and 1% lidocaine with
epinephrine as local anesthetic, under stereotactic guidance, a 9
gauge vacuum assisted device was used to perform core needle biopsy
of calcifications in the lower outer quadrant of the left breast
using a lateral to medial approach. Specimen radiograph was
performed showing calcifications are present in the tissue samples.
Specimens with calcifications are identified for pathology.

Lesion quadrant: Lower outer quadrant

At the conclusion of the procedure, X shaped tissue marker clip was
deployed into the biopsy cavity. Follow-up 2-view mammogram was
performed and dictated separately.
IMPRESSION: Stereotactic-guided biopsy of the left breast. No apparent
complications.

ADDENDUM:
Pathology revealed Breast, LEFT, needle core biopsy, lower outer
quadrant- FIBROADENOMATOID NODULE WITH CALCIFICATIONS- NEGATIVE FOR
MALIGNANCY. This was found to be concordant by Dr. Bokhari Nany.

Pathology results were discussed with the patient by telephone with
Phindani Tsabedze RN. The patient reported doing well after the
biopsy with tenderness at the site. Post biopsy instructions and
care were reviewed and questions were answered. The patient was
encouraged to call The [REDACTED] for any
additional concerns.

The patient was instructed to return for annual screening
mammography and informed a reminder notice would be sent regarding
this appointment.

Pathology results reported by Jaylon Aujla RN on 04/23/2022.



Using sterile technique and 1% lidocaine and 1% lidocaine with
epinephrine as local anesthetic, under stereotactic guidance, a 9
gauge vacuum assisted device was used to perform core needle biopsy
of calcifications in the lower outer quadrant of the left breast
using a lateral to medial approach. Specimen radiograph was
performed showing calcifications are present in the tissue samples.
Specimens with calcifications are identified for pathology.

Lesion quadrant: Lower outer quadrant

At the conclusion of the procedure, X shaped tissue marker clip was
deployed into the biopsy cavity. Follow-up 2-view mammogram was
performed and dictated separately.
IMPRESSION: Stereotactic-guided biopsy of the left breast. No apparent
complications.

## 2023-09-24 ENCOUNTER — Other Ambulatory Visit: Payer: Self-pay | Admitting: Physician Assistant

## 2023-09-24 DIAGNOSIS — Z79899 Other long term (current) drug therapy: Secondary | ICD-10-CM

## 2023-09-24 DIAGNOSIS — R7989 Other specified abnormal findings of blood chemistry: Secondary | ICD-10-CM

## 2023-09-24 DIAGNOSIS — M0579 Rheumatoid arthritis with rheumatoid factor of multiple sites without organ or systems involvement: Secondary | ICD-10-CM

## 2023-09-24 DIAGNOSIS — J849 Interstitial pulmonary disease, unspecified: Secondary | ICD-10-CM

## 2023-09-25 NOTE — Telephone Encounter (Signed)
Last Fill: 07/07/2023  Labs: 07/15/2023  Absolute monocytes and eosinophils are slightly elevated.  Patient had recent covid-19 infection. Rest of CBC WNL. CMP stable. No medication changes recommended at this time.  Next Visit: 10/15/2023  Last Visit: 07/15/2023  DX: Rheumatoid arthritis involving multiple sites with positive rheumatoid factor   Current Dose per office note 07/15/2023: Methotrexate 6 tablets by mouth once weekly   Okay to refill Methotrexate?

## 2023-10-01 NOTE — Progress Notes (Unsigned)
Office Visit Note  Patient: [name]             Date of Birth: 09-22-1946           MRN: [id number]             PCP: Shon Hale, MD Referring: Shon Hale, * Visit Date: 10/15/2023 Occupation: @GUAROCC @  Subjective:  Right elbow joints    History of Present Illness: [name] is a 77 y.o. female with history of seropositive rheumatoid arthritis and ILD.  Patient remains on Methotrexate 6 tablets by mouth once weekly, folic acid 1 mg by mouth daily, and hydroxychloroquine 200 mg daily Monday to Friday.  She has been tolerating combination therapy without any side effects and has not had any gaps in therapy recently.  She denies any recent or recurrent infections.  Patient is up-to-date with annual flu shot, COVID-vaccine, and RSV vaccine.  Patient states that she has had intermittent discomfort in her right elbow but denies any warmth or swelling at this time.  She denies any other increased joint pain or inflammation currently.  Patient states that overall her symptoms seem stable on the current treatment regimen.  She denies any new medical conditions.    Activities of Daily Living:  Patient reports morning stiffness for 0 minutes.   Patient Denies nocturnal pain.  Difficulty dressing/grooming: Denies Difficulty climbing stairs: Reports Difficulty getting out of chair: Denies Difficulty using hands for taps, buttons, cutlery, and/or writing: Reports  Review of Systems  Constitutional:  Positive for fatigue.  HENT:  Positive for mouth dryness. Negative for mouth sores.   Eyes:  Positive for dryness. Negative for pain and visual disturbance.  Respiratory:  Positive for cough, shortness of breath and wheezing.   Cardiovascular:  Negative for chest pain and palpitations.  Gastrointestinal:  Negative for blood in stool, constipation and diarrhea.  Endocrine: Negative for increased urination.  Genitourinary:  Negative for involuntary urination.   Musculoskeletal:  Positive for joint pain, joint pain and muscle weakness. Negative for gait problem, joint swelling, myalgias, morning stiffness, muscle tenderness and myalgias.  Skin:  Negative for color change, rash, hair loss and sensitivity to sunlight.  Allergic/Immunologic: Negative for susceptible to infections.  Neurological:  Positive for dizziness and headaches.  Hematological:  Negative for swollen glands.  Psychiatric/Behavioral:  Positive for depressed mood and sleep disturbance. The patient is nervous/anxious.     PMFS History:  Patient Active Problem List   Diagnosis Date Noted   ILD (interstitial lung disease) (HCC) 01/21/2023   Acute respiratory failure with hypoxia (HCC) 02/24/2022   GERD (gastroesophageal reflux disease) 02/24/2022   Elevated troponin 02/24/2022   Bilateral carpal tunnel syndrome 04/14/2018   Cervical spinal stenosis 01/07/2018   Cervical radiculopathy 01/07/2018   Stenosis of left subclavian artery (HCC) 12/15/2017   History of gastroesophageal reflux (GERD) 06/25/2017   History of bilateral carpal tunnel release 06/25/2017   Former smoker 01/25/2017   Rheumatoid arthritis involving multiple sites with positive rheumatoid factor (HCC) 01/17/2017   High risk medication use 01/17/2017   Primary osteoarthritis of both hands 01/17/2017   Primary osteoarthritis of both feet 01/17/2017   DJD (degenerative joint disease), cervical 01/17/2017   Spondylosis of lumbar region without myelopathy or radiculopathy 01/17/2017   Cardiomyopathy, ischemic 07/07/2016   CAD- S/P LAD thrombectomy 07/04/16 07/07/2016   Pacemaker-MDT BiV placed 07/05/16 07/07/2016   History of rheumatoid arthritis 07/07/2016   Complete heart block (HCC) 07/04/2016   Essential hypertension 07/04/2016  LBBB (left bundle branch block) 07/04/2016   Exostosis 06/18/2011   History of breast cancer 02/16/2010   Vitamin D deficiency 02/15/2010   HYPERCHOLESTEROLEMIA 02/15/2010   Anxiety  state 02/15/2010   Asthma 02/15/2010   MIGRAINES, HX OF 02/15/2010   MASTECTOMY, LEFT, HX OF 02/15/2010    Past Medical History:  Diagnosis Date   Anxiety    Asthma    Breast cancer (HCC)    left breast   Carpal tunnel syndrome    COVID-19 06/2023   Depression    Habitual alcohol use    Heart attack (HCC)    Hypertension    Interstitial lung disease (HCC)    Personal history of chemotherapy    Personal history of radiation therapy    RA (rheumatoid arthritis) (HCC)    Spinal stenosis    Vision abnormalities     Family History  Problem Relation Age of Onset   Stroke Mother    Cancer Father        colon?   Asthma Father    Pyelonephritis Sister    Pulmonary fibrosis Sister    Cancer Paternal Aunt    Cancer Paternal Uncle    Asthma Paternal Grandfather    Past Surgical History:  Procedure Laterality Date   BREAST BIOPSY     BREAST LUMPECTOMY  2005   left   CARDIAC CATHETERIZATION N/A 07/04/2016   Procedure: Left Heart Cath and Coronary Angiography;  Surgeon: Lyn Records, MD;  Location: [facility] INVASIVE CV LAB;  Service: Cardiovascular;  Laterality: N/A;   CARDIAC CATHETERIZATION N/A 07/04/2016   Procedure: Temporary Pacemaker;  Surgeon: Lyn Records, MD;  Location: [facility] INVASIVE CV LAB;  Service: Cardiovascular;  Laterality: N/A;   CARPAL TUNNEL RELEASE     CATARACT EXTRACTION Bilateral    EP IMPLANTABLE DEVICE N/A 07/05/2016   Procedure: Pacemaker Implant;  Surgeon: Marinus Maw, MD;  Location: [facility] INVASIVE CV LAB;  Service: Cardiovascular;  Laterality: N/A;   herniated rectum repair   11/2020   LAMINECTOMY  1966   LYMPHECTOMY  2005   Social History   Social History Narrative   Not on file   Immunization History  Administered Date(s) Administered   Fluad Quad(high Dose 65+) 08/16/2019, 08/05/2022   Influenza Split 09/10/2007, 09/05/2009, 08/05/2011, 09/15/2012, 08/12/2013   Influenza Whole 08/21/2009, 08/29/2010   Influenza, High Dose Seasonal PF 08/30/2021    Influenza,inj,Quad PF,6+ Mos 08/21/2016, 09/24/2017, 07/27/2018, 08/21/2020   Influenza,inj,quad, With Preservative 08/01/2014, 08/16/2015   PFIZER(Purple Top)SARS-COV-2 Vaccination 01/14/2020, 02/08/2020, 08/08/2020, 04/09/2021   Pneumococcal Conjugate-13 08/01/2014   Pneumococcal Polysaccharide-23 05/08/2009, 04/23/2022   Td 01/26/2018   Tdap 05/06/2007   Zoster, Live 06/16/2009     Objective: Vital Signs: BP 127/65 (BP Location: Right Arm, Patient Position: Sitting, Cuff Size: Normal)   Pulse 74   Resp 14   Ht 5\' 2"  (1.575 m)   Wt 124 lb (56.2 kg)   BMI 22.68 kg/m    Physical Exam Vitals and nursing note reviewed.  Constitutional:      Appearance: She is well-developed.  HENT:     Head: Normocephalic and atraumatic.  Eyes:     Conjunctiva/sclera: Conjunctivae normal.  Cardiovascular:     Rate and Rhythm: Normal rate and regular rhythm.     Heart sounds: Normal heart sounds.  Pulmonary:     Effort: Pulmonary effort is normal.     Breath sounds: Normal breath sounds.  Abdominal:     General: Bowel sounds are normal.  Palpations: Abdomen is soft.  Musculoskeletal:     Cervical back: Normal range of motion.  Lymphadenopathy:     Cervical: No cervical adenopathy.  Skin:    General: Skin is warm and dry.     Capillary Refill: Capillary refill takes less than 2 seconds.  Neurological:     Mental Status: She is alert and oriented to person, place, and time.  Psychiatric:        Behavior: Behavior normal.      Musculoskeletal Exam: C-spine has limited range of motion with lateral rotation. Thoracic kyphosis noted. No midline spinal tenderness.  No SI joint tenderness.  Shoulder joints have limited abduction to about 120 degrees.  Flexion contracture noted in the right elbow.  Tenderness along the medial epicondyle of the right elbow.  Wrist joints, MCPs, PIPs, DIPs have good range of motion with no synovitis.  Complete fist formation bilaterally.  Synovial thickening of  MCP joints but no active synovitis.  PIP and DIP thickening consistent with osteoarthritis of both hands.  Hip joints have good range of motion.  Knee joints have good range of motion with no warmth or effusion.  Ankle joints have good range of motion with no tenderness or joint swelling.  CDAI Exam: CDAI Score: 9  Patient Global: 40 / 100; Provider Global: 40 / 100 Swollen: 0 ; Tender: 1  Joint Exam 10/15/2023      Right  Left  Elbow   Tender        Investigation: No additional findings.  Imaging: No results found.  Recent Labs: Lab Results  Component Value Date   WBC 6.9 07/15/2023   HGB 13.7 07/15/2023   PLT 348 07/15/2023   NA 137 07/15/2023   K 4.1 07/15/2023   CL 98 07/15/2023   CO2 31 07/15/2023   GLUCOSE 64 (L) 07/15/2023   BUN 15 07/15/2023   CREATININE 0.54 (L) 07/15/2023   BILITOT 0.5 07/15/2023   ALKPHOS 76 11/14/2022   AST 33 07/15/2023   ALT 22 07/15/2023   PROT 6.3 07/15/2023   ALBUMIN 3.2 (L) 11/14/2022   CALCIUM 9.2 07/15/2023   GFRAA 108 12/26/2020    Speciality Comments: PLQ Eye Exam: 04/21/2023 WNL @ [facility] f/u 12  months.  Procedures:  No procedures performed Allergies: Betadine [povidone iodine] and Hydrocodone      Assessment / Plan:     Visit Diagnoses: Rheumatoid arthritis involving multiple sites with positive rheumatoid factor (HCC) -She has no synovitis on examination today.  Her symptoms have been clinically stable while taking methotrexate 6 tablets by mouth once weekly, folic acid 1 mg daily, and Plaquenil 200 mg 1 tablet daily Monday through Friday.  She has been tolerating combination therapy without any side effects and has not had any gaps in therapy.  No recent or recurrent infections.  She experiences intermittent discomfort in her right elbow--flexion contracture appears unchanged.  No warmth or inflammation noted but she has some tenderness along the joint line and over the medial epicondyle of the right elbow.   She will notify us if her symptoms persist or worsen.  She has declined a cortisone injection at this time.  She will remain on Plaquenil and methotrexate as combination therapy.  A refill of Plaquenil sent to the pharmacy today.  She was advised to notify us if she develops signs or symptoms of a flare.  She will follow-up in the office in 3 months or sooner if needed.  Plan: hydroxychloroquine (PLAQUENIL) 200 MG  tablet  High risk medication use - Methotrexate 6 tablets by mouth once weekly, folic acid 1 mg by mouth daily, and hydroxychloroquine 200 mg  daily Monday to Friday.  CBC and CMP updated on 07/15/23. Orders for CBC and CMP released today.  Her next lab work will be due at the end of February and every 3 months to monitor for drug toxicity. No recent or recurrent infections. Discussed the importance of holding methotrexate if she develops signs or symptoms of an infection and to resume once the infection has completely cleared.   Up-to-date with annual flu shot, COVID-vaccine, and RSV vaccine. - Plan: COMPLETE METABOLIC PANEL WITH GFR, CBC with Differential/Platelet  ILD (interstitial lung disease) (HCC): Probable early UIP related to underlying RA- High-resolution chest CT 05/23/2022. Evaluated at [facility] on 01/21/23-Dyspnea with exertion and intermittent wheezing. CT chest in July 2023 suggestive of interstitial lung disease. 10% decrease in lung function. CT: 02/07/23: Mild subpleural fibrosis without a definite zonal predominance and questionable single layer honeycombing. Findings are categorized as probable UIP per consensus guidelines: Diagnosis of Idiopathic Pulmonary Fibrosis. Cleared by pulmonary to continue methotrexate. The dose of methotrexate was increased from 4 tablets to 6 tablets weekly in mid-April. She has not noticed any new or worsening pulmonary symptoms while taking methotrexate.   Evaluated by Dr. Celine Mans on 10/02/23: CT Chest shows stable ILD from 2023 to 2024.  Spirometry and DLCO reinforce stable lung function. Recommended continuing symbicort 2 puffs twice daily for airway involvement.   Elevated LFTs -AST and ALT were within normal limits on 07/15/2023.  CMP updated today.  Plan: COMPLETE METABOLIC PANEL WITH GFR  Arthritis of right acromioclavicular joint: Limited abduction.  Contracture of right elbow: Unchanged.  Tenderness over the medial epicondyle and along the joint line of the right elbow.  She has intermittent discomfort in her right elbow but has not noticed any warmth or swelling.  Discussed that if her symptoms persist or worsen she can return for an injection in the future if needed.  Primary osteoarthritis of both hands: She has PIP and DIP thickening consistent with osteoarthritis of both hands.  No synovitis noted on examination today.  Trigger finger, right middle finger: Not currently symptomatic.  Primary osteoarthritis of right knee: She has good range of motion of the right knee joint on examination today.  No warmth or effusion noted.  Trochanteric bursitis, left hip: Not currently symptomatic.  Primary osteoarthritis of both feet: She is not experiencing any increased discomfort in her feet at this time.  DDD (degenerative disc disease), cervical: C-spine has limited ROM with lateral rotation. She has been performing neck exercises daily.   Degeneration of intervertebral disc of lumbar region without discogenic back pain or lower extremity pain: No midline spinal tenderness.  No symptoms of radiculopathy.   Chronic left SI joint pain: No SI joint tenderness upon palpation.   Other medical conditions are listed as follows:  History of vitamin D deficiency  History of gastroesophageal reflux (GERD)  History of hyperlipidemia  History of anxiety  History of breast cancer  History of ventricular tachycardia  Former smoker  History of asthma  History of hypertension: Blood pressure was 147/74 today in the  office.  Her blood pressure was rechecked prior to leaving.  Patient was advised to monitor blood pressure closely and to reach out to PCP if remains elevated.  History of left bundle branch block  Orders: Orders Placed This Encounter  Procedures   COMPLETE METABOLIC PANEL WITH  GFR   CBC with Differential/Platelet   Meds ordered this encounter  Medications   hydroxychloroquine (PLAQUENIL) 200 MG tablet    Sig: TAKE 1 TABLET BY MOUTH DAILY MONDAY-FRIDAY    Dispense:  60 tablet    Refill:  0     Follow-Up Instructions: Return in about 3 months (around 01/15/2024) for Rheumatoid arthritis, ILD.   Gearldine Bienenstock, PA-C  Note - This record has been created using Dragon software.  Chart creation errors have been sought, but may not always  have been located. Such creation errors do not reflect on  the standard of medical care.

## 2023-10-02 ENCOUNTER — Ambulatory Visit: Payer: Medicare Other | Admitting: Internal Medicine

## 2023-10-02 ENCOUNTER — Encounter: Payer: Self-pay | Admitting: Internal Medicine

## 2023-10-02 VITALS — BP 138/60 | HR 68 | Ht 62.0 in | Wt 124.1 lb

## 2023-10-02 DIAGNOSIS — M05742 Rheumatoid arthritis with rheumatoid factor of left hand without organ or systems involvement: Secondary | ICD-10-CM

## 2023-10-02 DIAGNOSIS — J849 Interstitial pulmonary disease, unspecified: Secondary | ICD-10-CM

## 2023-10-02 DIAGNOSIS — M05741 Rheumatoid arthritis with rheumatoid factor of right hand without organ or systems involvement: Secondary | ICD-10-CM

## 2023-10-02 DIAGNOSIS — M0579 Rheumatoid arthritis with rheumatoid factor of multiple sites without organ or systems involvement: Secondary | ICD-10-CM

## 2023-10-02 LAB — PULMONARY FUNCTION TEST
DL/VA % pred: 78 %
DL/VA: 3.26 ml/min/mmHg/L
DLCO cor % pred: 54 %
DLCO cor: 9.68 ml/min/mmHg
DLCO unc % pred: 54 %
DLCO unc: 9.68 ml/min/mmHg
FEF 25-75 Pre: 0.96 L/s
FEF2575-%Pred-Pre: 71 %
FEV1-%Pred-Pre: 79 %
FEV1-Pre: 1.42 L
FEV1FVC-%Pred-Pre: 99 %
FEV6-%Pred-Pre: 85 %
FEV6-Pre: 1.94 L
FEV6FVC-%Pred-Pre: 105 %
FVC-%Pred-Pre: 80 %
FVC-Pre: 1.94 L
Pre FEV1/FVC ratio: 73 %
Pre FEV6/FVC Ratio: 100 %

## 2023-10-02 NOTE — Progress Notes (Signed)
Spirometry/DLCO performed today. 

## 2023-10-02 NOTE — Progress Notes (Signed)
Daleisa Strege    [id number]    1946/10/01  Primary Care Physician:Timberlake, Meridee Score, MD Date of Appointment: 10/02/2023 Established Patient Visit  Chief complaint:   Chief Complaint  Patient presents with   Follow-up    PFT /FU visit today     HPI: [name] is a 77 y.o. woman with history of tobacco use disorder, rheumatoid arthritis and shortness of breath.   Interval Updates: Here for follow up for RA-ILD, likely UIP. Here with her daughter.   Had follow up PFT which shows stable spirometry (unchanged) and stable DLCO. Still on symbicort.   She does have dyspnea with exertion - has to stop during vacuuming and during other ADLs. She is using albuterol more frequently than she was.  Her pain is improved from RA overall except for lower back pain.  The pain also limits her mobility. Her methotrexate was increased at the last visit which did help. Still on plaquenil.   No interval hospitalizations or ED visits.   I have reviewed the patient's family social and past medical history and updated as appropriate.   Past Medical History:  Diagnosis Date   Anxiety    Asthma    Breast cancer (HCC)    left breast   Carpal tunnel syndrome    COVID-19 06/2023   Depression    Habitual alcohol use    Heart attack (HCC)    Hypertension    Interstitial lung disease (HCC)    Personal history of chemotherapy    Personal history of radiation therapy    RA (rheumatoid arthritis) (HCC)    Spinal stenosis    Vision abnormalities     Past Surgical History:  Procedure Laterality Date   BREAST BIOPSY     BREAST LUMPECTOMY  2005   left   CARDIAC CATHETERIZATION N/A 07/04/2016   Procedure: Left Heart Cath and Coronary Angiography;  Surgeon: Lyn Records, MD;  Location: [facility] INVASIVE CV LAB;  Service: Cardiovascular;  Laterality: N/A;   CARDIAC CATHETERIZATION N/A 07/04/2016   Procedure: Temporary Pacemaker;  Surgeon: Lyn Records, MD;  Location: [facility] INVASIVE  CV LAB;  Service: Cardiovascular;  Laterality: N/A;   CARPAL TUNNEL RELEASE     CATARACT EXTRACTION Bilateral    EP IMPLANTABLE DEVICE N/A 07/05/2016   Procedure: Pacemaker Implant;  Surgeon: Marinus Maw, MD;  Location: [facility] INVASIVE CV LAB;  Service: Cardiovascular;  Laterality: N/A;   herniated rectum repair   11/2020   LAMINECTOMY  1966   LYMPHECTOMY  2005    Family History  Problem Relation Age of Onset   Stroke Mother    Cancer Father        colon?   Asthma Father    Pyelonephritis Sister    Pulmonary fibrosis Sister    Cancer Paternal Aunt    Cancer Paternal Uncle    Asthma Paternal Grandfather     Social History   Occupational History   Not on file  Tobacco Use   Smoking status: Former    Current packs/day: 0.00    Average packs/day: 2.0 packs/day for 25.0 years (50.0 ttl pk-yrs)    Types: Cigarettes    Start date: 62    Quit date: 1995    Years since quitting: 29.8    Passive exposure: Past   Smokeless tobacco: Never  Vaping Use   Vaping status: Never Used  Substance and Sexual Activity   Alcohol use: Yes    Alcohol/week:  14.0 standard drinks of alcohol    Types: 14 Cans of beer per week    Comment: 2 DAILY   Drug use: Never   Sexual activity: Not on file     Physical Exam: Blood pressure 138/60, pulse 68, height 5\' 2"  (1.575 m), weight 124 lb 1.6 oz (56.3 kg), SpO2 95%.  Gen:      No distress, on room air Lungs:    ctab no wheezes, mild kyphosis CV:         RRR Ext: swan hand deformity   Data Reviewed: Imaging: I have personally reviewed the CT scan Hi Res from July 2023 which shows peripheral reticular markings with traction bronchiectasis in an apical basal gradient. There is no ground glass. These findings are consistent with UIP.   PFTs:     Latest Ref Rng & Units 10/02/2023   10:20 AM 01/21/2023    2:03 PM 05/01/2022   12:05 PM  PFT Results  FVC-Pre L 1.94  P 1.65  1.88   FVC-Predicted Pre % 80  P 68  76   FVC-Post L   1.93    FVC-Predicted Post %   78   Pre FEV1/FVC % % 73  P 78  77   Post FEV1/FCV % %   77   FEV1-Pre L 1.42  P 1.28  1.45   FEV1-Predicted Pre % 79  P 71  79   FEV1-Post L   1.50   DLCO uncorrected ml/min/mmHg 9.68  P 10.43  10.33   DLCO UNC% % 54  P 59  58   DLCO corrected ml/min/mmHg 9.68  P 10.53  10.33   DLCO COR %Predicted % 54  P 59  58   DLVA Predicted % 78  P 88  75   TLC L   4.21   TLC % Predicted %   88   RV % Predicted %   98     P Preliminary result   I have personally reviewed the patient's PFTs and normal spirometry and lung volumes, mildly reduced diffusion capacity.   Labs: Lab Results  Component Value Date   WBC 6.9 07/15/2023   HGB 13.7 07/15/2023   HCT 40.8 07/15/2023   MCV 96.2 07/15/2023   PLT 348 07/15/2023   Lab Results  Component Value Date   NA 137 07/15/2023   K 4.1 07/15/2023   CL 98 07/15/2023   CO2 31 07/15/2023    Immunization status: Immunization History  Administered Date(s) Administered   Fluad Quad(high Dose 65+) 08/16/2019, 08/05/2022   Influenza Split 09/10/2007, 09/05/2009, 08/05/2011, 09/15/2012, 08/12/2013   Influenza Whole 08/21/2009, 08/29/2010   Influenza, High Dose Seasonal PF 08/30/2021   Influenza,inj,Quad PF,6+ Mos 08/21/2016, 09/24/2017, 07/27/2018, 08/21/2020   Influenza,inj,quad, With Preservative 08/01/2014, 08/16/2015   PFIZER(Purple Top)SARS-COV-2 Vaccination 01/14/2020, 02/08/2020, 08/08/2020, 04/09/2021   Pneumococcal Conjugate-13 08/01/2014   Pneumococcal Polysaccharide-23 05/08/2009, 04/23/2022   Td 01/26/2018   Tdap 05/06/2007   Zoster, Live 06/16/2009    External Records Personally Reviewed: ED, PCP  Assessment:  ILD - probable early UIP related to underlying Rheumatoid Arthritis with airway involvement. History of tobacco use disorder Left hemidiaphragm eventration Rheumatoid arthritis Encounter for high risk drug monitoring.   Plan/Recommendations:   CT Chest shows stable ILD. From 2023 to 2024.   Spirometry and dlco reinforce stable lung function.  Continue symbicort 2 puffs twice daily for airway involvement.   Labs reviewed August 2024 2024, stable cmet, ok to continue methotrexate.  Her RA  ILD seems stable based on the above with methotrexate and plaquenil.  Cell cept, imuran, and rituxumab all have evidence to be efficacious in RA-ILD if we need to change therapy in the future.   Return to Care: Return in about 6 months (around 03/31/2024).   Durel Salts, MD Pulmonary and Critical Care Medicine [facility] [phone]

## 2023-10-02 NOTE — Patient Instructions (Signed)
It was a pleasure to see you today!  Please schedule follow up scheduled with myself in 6 months.  If my schedule is not open yet, we will contact you with a reminder closer to that time. Please call [phone] if you haven't heard from [place] a month before, and always call us sooner if issues or concerns arise. You can also send [place] a message through MyChart, but but aware that this is not to be used for urgent issues and it may take up to 5-7 days to receive a reply. Please be aware that you will likely be able to view your results before I have a chance to respond to them. Please give [place] 5 business days to respond to any non-urgent results.   Call us in 3 months to make your 6 month appointment.   Continue symbicort 2 puffs twice daily.  The breathing testing looks stable!   Call me sooner if you think breathing is worsening.

## 2023-10-02 NOTE — Patient Instructions (Signed)
Spirometry/DLCO performed today. 

## 2023-10-15 ENCOUNTER — Ambulatory Visit: Payer: Medicare Other | Attending: Physician Assistant | Admitting: Physician Assistant

## 2023-10-15 ENCOUNTER — Encounter: Payer: Self-pay | Admitting: Physician Assistant

## 2023-10-15 VITALS — BP 127/65 | HR 74 | Resp 14 | Ht 62.0 in | Wt 124.0 lb

## 2023-10-15 DIAGNOSIS — Z8719 Personal history of other diseases of the digestive system: Secondary | ICD-10-CM

## 2023-10-15 DIAGNOSIS — M19072 Primary osteoarthritis, left ankle and foot: Secondary | ICD-10-CM

## 2023-10-15 DIAGNOSIS — Z79899 Other long term (current) drug therapy: Secondary | ICD-10-CM

## 2023-10-15 DIAGNOSIS — Z8659 Personal history of other mental and behavioral disorders: Secondary | ICD-10-CM

## 2023-10-15 DIAGNOSIS — Z87891 Personal history of nicotine dependence: Secondary | ICD-10-CM

## 2023-10-15 DIAGNOSIS — M19011 Primary osteoarthritis, right shoulder: Secondary | ICD-10-CM | POA: Diagnosis not present

## 2023-10-15 DIAGNOSIS — Z8679 Personal history of other diseases of the circulatory system: Secondary | ICD-10-CM

## 2023-10-15 DIAGNOSIS — J849 Interstitial pulmonary disease, unspecified: Secondary | ICD-10-CM

## 2023-10-15 DIAGNOSIS — M19071 Primary osteoarthritis, right ankle and foot: Secondary | ICD-10-CM

## 2023-10-15 DIAGNOSIS — M19042 Primary osteoarthritis, left hand: Secondary | ICD-10-CM

## 2023-10-15 DIAGNOSIS — R7989 Other specified abnormal findings of blood chemistry: Secondary | ICD-10-CM

## 2023-10-15 DIAGNOSIS — M7062 Trochanteric bursitis, left hip: Secondary | ICD-10-CM | POA: Diagnosis not present

## 2023-10-15 DIAGNOSIS — Z853 Personal history of malignant neoplasm of breast: Secondary | ICD-10-CM

## 2023-10-15 DIAGNOSIS — M1711 Unilateral primary osteoarthritis, right knee: Secondary | ICD-10-CM | POA: Diagnosis not present

## 2023-10-15 DIAGNOSIS — M0579 Rheumatoid arthritis with rheumatoid factor of multiple sites without organ or systems involvement: Secondary | ICD-10-CM | POA: Diagnosis not present

## 2023-10-15 DIAGNOSIS — M19041 Primary osteoarthritis, right hand: Secondary | ICD-10-CM

## 2023-10-15 DIAGNOSIS — M51369 Other intervertebral disc degeneration, lumbar region without mention of lumbar back pain or lower extremity pain: Secondary | ICD-10-CM

## 2023-10-15 DIAGNOSIS — Z8639 Personal history of other endocrine, nutritional and metabolic disease: Secondary | ICD-10-CM

## 2023-10-15 DIAGNOSIS — M533 Sacrococcygeal disorders, not elsewhere classified: Secondary | ICD-10-CM

## 2023-10-15 DIAGNOSIS — M65331 Trigger finger, right middle finger: Secondary | ICD-10-CM | POA: Diagnosis not present

## 2023-10-15 DIAGNOSIS — G8929 Other chronic pain: Secondary | ICD-10-CM

## 2023-10-15 DIAGNOSIS — M5030 Other cervical disc degeneration, unspecified cervical region: Secondary | ICD-10-CM

## 2023-10-15 DIAGNOSIS — M24521 Contracture, right elbow: Secondary | ICD-10-CM

## 2023-10-15 DIAGNOSIS — Z8709 Personal history of other diseases of the respiratory system: Secondary | ICD-10-CM

## 2023-10-15 MED ORDER — HYDROXYCHLOROQUINE SULFATE 200 MG PO TABS
ORAL_TABLET | ORAL | 0 refills | Status: DC
Start: 2023-10-15 — End: 2024-01-15

## 2023-10-15 NOTE — Progress Notes (Signed)
CBC WNL

## 2023-10-16 LAB — COMPLETE METABOLIC PANEL WITH GFR
AG Ratio: 1.4 (calc) (ref 1.0–2.5)
ALT: 20 U/L (ref 6–29)
AST: 35 U/L (ref 10–35)
Albumin: 3.7 g/dL (ref 3.6–5.1)
Alkaline phosphatase (APISO): 62 U/L (ref 37–153)
BUN/Creatinine Ratio: 27 (calc) — ABNORMAL HIGH (ref 6–22)
BUN: 15 mg/dL (ref 7–25)
CO2: 31 mmol/L (ref 20–32)
Calcium: 9.2 mg/dL (ref 8.6–10.4)
Chloride: 99 mmol/L (ref 98–110)
Creat: 0.55 mg/dL — ABNORMAL LOW (ref 0.60–1.00)
Globulin: 2.7 g/dL (ref 1.9–3.7)
Glucose, Bld: 90 mg/dL (ref 65–99)
Potassium: 4.3 mmol/L (ref 3.5–5.3)
Sodium: 137 mmol/L (ref 135–146)
Total Bilirubin: 0.5 mg/dL (ref 0.2–1.2)
Total Protein: 6.4 g/dL (ref 6.1–8.1)
eGFR: 93 mL/min/{1.73_m2} (ref >=60)

## 2023-10-16 LAB — CBC WITH DIFFERENTIAL/PLATELET
Absolute Lymphocytes: 1576 {cells}/uL (ref 850–3900)
Absolute Monocytes: 866 {cells}/uL (ref 200–950)
Basophils Absolute: 74 {cells}/uL (ref 0–200)
Basophils Relative: 1 %
Eosinophils Absolute: 311 {cells}/uL (ref 15–500)
Eosinophils Relative: 4.2 %
HCT: 42 % (ref 35.0–45.0)
Hemoglobin: 14 g/dL (ref 11.7–15.5)
MCH: 31.5 pg (ref 27.0–33.0)
MCHC: 33.3 g/dL (ref 32.0–36.0)
MCV: 94.6 fL (ref 80.0–100.0)
MPV: 9.2 fL (ref 7.5–12.5)
Monocytes Relative: 11.7 %
Neutro Abs: 4573 {cells}/uL (ref 1500–7800)
Neutrophils Relative %: 61.8 %
Platelets: 325 10*3/uL (ref 140–400)
RBC: 4.44 10*6/uL (ref 3.80–5.10)
RDW: 12.9 % (ref 11.0–15.0)
Total Lymphocyte: 21.3 %
WBC: 7.4 10*3/uL (ref 3.8–10.8)

## 2023-10-17 ENCOUNTER — Other Ambulatory Visit: Payer: Self-pay | Admitting: Physician Assistant

## 2023-10-17 NOTE — Progress Notes (Signed)
Creatinine remains borderline low-stable. Rest of CMP WNL.

## 2023-10-20 ENCOUNTER — Other Ambulatory Visit: Payer: Self-pay

## 2023-10-20 MED ORDER — PANTOPRAZOLE SODIUM 40 MG PO TBEC: DELAYED_RELEASE_TABLET | ORAL | 2 refills | Status: DC

## 2023-10-20 NOTE — Telephone Encounter (Signed)
Last Fill: 11/06/2023  Next Visit: 01/15/2024  Last Visit: 10/15/2023  Dx: Rheumatoid arthritis involving multiple sites with positive rheumatoid factor   Current Dose per office note on 10/15/2023: folic acid 1 mg by mouth daily   Okay to refill Folic Acid?

## 2023-11-06 DIAGNOSIS — I10 Essential (primary) hypertension: Secondary | ICD-10-CM | POA: Diagnosis not present

## 2023-11-06 DIAGNOSIS — R053 Chronic cough: Secondary | ICD-10-CM | POA: Diagnosis not present

## 2023-11-06 DIAGNOSIS — R059 Cough, unspecified: Secondary | ICD-10-CM | POA: Diagnosis not present

## 2023-11-06 DIAGNOSIS — S8012XA Contusion of left lower leg, initial encounter: Secondary | ICD-10-CM | POA: Diagnosis not present

## 2023-11-06 DIAGNOSIS — Z03818 Encounter for observation for suspected exposure to other biological agents ruled out: Secondary | ICD-10-CM | POA: Diagnosis not present

## 2023-11-06 DIAGNOSIS — Z20822 Contact with and (suspected) exposure to covid-19: Secondary | ICD-10-CM | POA: Diagnosis not present

## 2023-11-06 DIAGNOSIS — R7303 Prediabetes: Secondary | ICD-10-CM | POA: Diagnosis not present

## 2023-12-05 ENCOUNTER — Encounter: Payer: Self-pay | Admitting: Internal Medicine

## 2023-12-05 ENCOUNTER — Ambulatory Visit: Payer: Medicare Other | Attending: Internal Medicine | Admitting: Internal Medicine

## 2023-12-05 VITALS — BP 132/60 | HR 82 | Ht 62.0 in | Wt 122.2 lb

## 2023-12-05 DIAGNOSIS — I442 Atrioventricular block, complete: Secondary | ICD-10-CM | POA: Diagnosis not present

## 2023-12-05 DIAGNOSIS — I10 Essential (primary) hypertension: Secondary | ICD-10-CM

## 2023-12-05 DIAGNOSIS — Z950 Presence of cardiac pacemaker: Secondary | ICD-10-CM

## 2023-12-05 LAB — CUP PACEART INCLINIC DEVICE CHECK
Battery Remaining Longevity: 25 mo
Battery Voltage: 2.92 V
Brady Statistic AP VP Percent: 0.91 %
Brady Statistic AP VS Percent: 0.02 %
Brady Statistic AS VP Percent: 99.02 %
Brady Statistic AS VS Percent: 0.04 %
Brady Statistic RA Percent Paced: 0.93 %
Brady Statistic RV Percent Paced: 99.84 %
Date Time Interrogation Session: 20250117165214
Implantable Lead Connection Status: 753985
Implantable Lead Connection Status: 753985
Implantable Lead Connection Status: 753985
Implantable Lead Implant Date: 20170818
Implantable Lead Implant Date: 20170818
Implantable Lead Implant Date: 20170818
Implantable Lead Location: 753858
Implantable Lead Location: 753859
Implantable Lead Location: 753860
Implantable Lead Model: 4396
Implantable Lead Model: 5076
Implantable Lead Model: 5076
Implantable Pulse Generator Implant Date: 20170818
Lead Channel Impedance Value: 323 Ohm
Lead Channel Impedance Value: 380 Ohm
Lead Channel Impedance Value: 418 Ohm
Lead Channel Impedance Value: 418 Ohm
Lead Channel Impedance Value: 494 Ohm
Lead Channel Impedance Value: 513 Ohm
Lead Channel Impedance Value: 513 Ohm
Lead Channel Impedance Value: 646 Ohm
Lead Channel Impedance Value: 817 Ohm
Lead Channel Pacing Threshold Amplitude: 0.5 V
Lead Channel Pacing Threshold Amplitude: 0.625 V
Lead Channel Pacing Threshold Amplitude: 0.875 V
Lead Channel Pacing Threshold Pulse Width: 0.4 ms
Lead Channel Pacing Threshold Pulse Width: 0.4 ms
Lead Channel Pacing Threshold Pulse Width: 0.4 ms
Lead Channel Sensing Intrinsic Amplitude: 0.75 mV
Lead Channel Sensing Intrinsic Amplitude: 2.625 mV
Lead Channel Sensing Intrinsic Amplitude: 4.875 mV
Lead Channel Sensing Intrinsic Amplitude: 7.625 mV
Lead Channel Setting Pacing Amplitude: 1.5 V
Lead Channel Setting Pacing Amplitude: 2 V
Lead Channel Setting Pacing Amplitude: 2 V
Lead Channel Setting Pacing Pulse Width: 0.4 ms
Lead Channel Setting Pacing Pulse Width: 0.4 ms
Lead Channel Setting Sensing Sensitivity: 0.9 mV
Zone Setting Status: 755011
Zone Setting Status: 755011

## 2023-12-05 NOTE — Progress Notes (Signed)
HPI [name] returns today for followup of her biv PPM. She is a pleasant 78 yo woman with a  H/o HTN, CAD, s/p MI, chronic systolic heart failure, s/p biv PPM insertion in 2017. In the interim, she notes she has done well with no chest pain or sob, or edema.  She admits to some dietary indiscretion.  Allergies  Allergen Reactions   Betadine [Povidone Iodine] Shortness Of Breath   Hydrocodone Itching     Current Outpatient Medications  Medication Sig Dispense Refill   acetaminophen (TYLENOL) 325 MG tablet Take 325-650 mg by mouth every 6 (six) hours as needed for mild pain, headache or fever.     albuterol (PROVENTIL HFA;VENTOLIN HFA) 108 (90 Base) MCG/ACT inhaler Inhale 2 puffs into the lungs every 6 (six) hours as needed for wheezing or shortness of breath.     aspirin 81 MG tablet Take 81 mg by mouth daily.     atorvastatin (LIPITOR) 40 MG tablet TAKE 1 TABLET(40 MG) BY MOUTH DAILY AT 6 PM 90 tablet 3   budesonide-formoterol (SYMBICORT) 160-4.5 MCG/ACT inhaler Inhale 2 puffs into the lungs 2 (two) times daily. 1 each 6   calcium carbonate (OS-CAL) 600 MG TABS Take 600 mg by mouth 2 (two) times daily with a meal.     Cholecalciferol (VITAMIN D3) 50 MCG (2000 UT) TABS Take 2,000 Units by mouth daily.     citalopram (CELEXA) 20 MG tablet Take 20 mg by mouth daily.  3   clonazePAM (KLONOPIN) 0.5 MG tablet Take 0.25-0.5 mg by mouth daily as needed for anxiety.  0   fluticasone-salmeterol (ADVAIR HFA) 115-21 MCG/ACT inhaler Inhale 2 puffs into the lungs 2 (two) times daily. 1 each 12   folic acid (FOLVITE) 1 MG tablet TAKE 1 TABLET BY MOUTH DAILY 90 tablet 3   hydroxychloroquine (PLAQUENIL) 200 MG tablet TAKE 1 TABLET BY MOUTH DAILY MONDAY-FRIDAY 60 tablet 0   losartan-hydrochlorothiazide (HYZAAR) 50-12.5 MG tablet TAKE 1 TABLET BY MOUTH DAILY 90 tablet 3   melatonin 3 MG TABS tablet Take 3 mg by mouth at bedtime as needed (for sleep).     methotrexate (RHEUMATREX) 2.5 MG tablet  TAKE 6 TABLETS BY MOUTH 1 TIME WEEKLY 72 tablet 0   nitroGLYCERIN (NITROSTAT) 0.4 MG SL tablet Place 1 tablet (0.4 mg total) under the tongue every 5 (five) minutes as needed for chest pain. 25 tablet 2   pantoprazole (PROTONIX) 40 MG tablet TAKE 1 TABLET(40 MG) BY MOUTH DAILY. 90 tablet 2   traZODone (DESYREL) 50 MG tablet Take 50 mg by mouth at bedtime as needed for sleep.     No current facility-administered medications for this visit.     Past Medical History:  Diagnosis Date   Anxiety    Asthma    Breast cancer (HCC)    left breast   Carpal tunnel syndrome    COVID-19 06/2023   Depression    Habitual alcohol use    Heart attack (HCC)    Hypertension    Interstitial lung disease (HCC)    Personal history of chemotherapy    Personal history of radiation therapy    RA (rheumatoid arthritis) (HCC)    Spinal stenosis    Vision abnormalities     ROS:   All systems reviewed and negative except as noted in the HPI.   Past Surgical History:  Procedure Laterality Date   BREAST BIOPSY     BREAST LUMPECTOMY  2005  left   CARDIAC CATHETERIZATION N/A 07/04/2016   Procedure: Left Heart Cath and Coronary Angiography;  Surgeon: Lyn Records, MD;  Location: [facility] INVASIVE CV LAB;  Service: Cardiovascular;  Laterality: N/A;   CARDIAC CATHETERIZATION N/A 07/04/2016   Procedure: Temporary Pacemaker;  Surgeon: Lyn Records, MD;  Location: [facility] INVASIVE CV LAB;  Service: Cardiovascular;  Laterality: N/A;   CARPAL TUNNEL RELEASE     CATARACT EXTRACTION Bilateral    EP IMPLANTABLE DEVICE N/A 07/05/2016   Procedure: Pacemaker Implant;  Surgeon: Marinus Maw, MD;  Location: [facility] INVASIVE CV LAB;  Service: Cardiovascular;  Laterality: N/A;   herniated rectum repair   11/2020   LAMINECTOMY  1966   LYMPHECTOMY  2005     Family History  Problem Relation Age of Onset   Stroke Mother    Cancer Father        colon?   Asthma Father    Pyelonephritis Sister    Pulmonary fibrosis Sister     Cancer Paternal Aunt    Cancer Paternal Uncle    Asthma Paternal Grandfather      Social History   Socioeconomic History   Marital status: Widowed    Spouse name: Not on file   Number of children: Not on file   Years of education: Not on file   Highest education level: Not on file  Occupational History   Not on file  Tobacco Use   Smoking status: Former    Current packs/day: 0.00    Average packs/day: 2.0 packs/day for 25.0 years (50.0 ttl pk-yrs)    Types: Cigarettes    Start date: 61    Quit date: 26    Years since quitting: 30.0    Passive exposure: Past   Smokeless tobacco: Never  Vaping Use   Vaping status: Never Used  Substance and Sexual Activity   Alcohol use: Yes    Alcohol/week: 14.0 standard drinks of alcohol    Types: 14 Cans of beer per week    Comment: 2 DAILY   Drug use: Never   Sexual activity: Not on file  Other Topics Concern   Not on file  Social History Narrative   Not on file   Social Drivers of Health   Financial Resource Strain: Not on file  Food Insecurity: Not on file  Transportation Needs: Not on file  Physical Activity: Not on file  Stress: Not on file  Social Connections: Not on file  Intimate Partner Violence: Not on file     BP 132/60   Pulse 82   Ht 5\' 2"  (1.575 m)   Wt 122 lb 3.2 oz (55.4 kg)   SpO2 97%   BMI 22.35 kg/m   Physical Exam:  Well appearing NAD HEENT: Unremarkable Neck:  No JVD, no thyromegally Lymphatics:  No adenopathy Back:  No CVA tenderness Lungs:  Clear with no wheezes HEART:  Regular rate rhythm, no murmurs, no rubs, no clicks Abd:  soft, positive bowel sounds, no organomegally, no rebound, no guarding Ext:  2 plus pulses, no edema, no cyanosis, no clubbing Skin:  No rashes no nodules Neuro:  CN II through XII intact, motor grossly intact  EKG - NSR with biv pacing  DEVICE  Normal device function.  See PaceArt for details.   Assess/Plan:  1. Chronic systolic heart failure -  Her  symptoms remain class 2. No change in her meds. 2. Biv PPM - her medtronic biv device is working normally. 3. CAD - she  denies anginal symptoms.  4. Dyslipidemia - she will continue her statin therapy with lipitor.   Sharlot Gowda Brittanie Dosanjh,MD

## 2023-12-05 NOTE — Patient Instructions (Addendum)
 Medication Instructions:  Your physician recommends that you continue on your current medications as directed. Please refer to the Current Medication list given to you today.  *If you need a refill on your cardiac medications before your next appointment, please call your pharmacy*  Lab Work: None ordered.  If you have labs (blood work) drawn today and your tests are completely normal, you will receive your results only by: MyChart Message (if you have MyChart) OR A paper copy in the mail If you have any lab test that is abnormal or we need to change your treatment, we will call you to review the results.  Testing/Procedures: None ordered.  Follow-Up: At [facility], you and your health needs are our priority.  As part of our continuing mission to provide you with exceptional heart care, we have created designated Provider Care Teams.  These Care Teams include your primary Cardiologist (physician) and Advanced Practice Providers (APPs -  Physician Assistants and Nurse Practitioners) who all work together to provide you with the care you need, when you need it.   Your next appointment:   1 year(s)  The format for your next appointment:   In Person  Provider:   Dr. Virl Son one of the following Advanced Practice Providers on your designated Care Team:   Francis Dowse, [place] Casimiro Needle "Mardelle Matte" [place], [place] Earnest Rosier, NP  Remote monitoring is used to monitor your Pacemaker/ ICD from home. This monitoring reduces the number of office visits required to check your device to one time per year. It allows [place] to keep an eye on the functioning of your device to ensure it is working properly.   Important Information About Sugar

## 2023-12-15 ENCOUNTER — Other Ambulatory Visit: Payer: Self-pay | Admitting: Physician Assistant

## 2023-12-15 DIAGNOSIS — J849 Interstitial pulmonary disease, unspecified: Secondary | ICD-10-CM

## 2023-12-15 DIAGNOSIS — R7989 Other specified abnormal findings of blood chemistry: Secondary | ICD-10-CM

## 2023-12-15 DIAGNOSIS — Z79899 Other long term (current) drug therapy: Secondary | ICD-10-CM

## 2023-12-15 DIAGNOSIS — M0579 Rheumatoid arthritis with rheumatoid factor of multiple sites without organ or systems involvement: Secondary | ICD-10-CM

## 2023-12-15 NOTE — Telephone Encounter (Signed)
Last Fill: 09/25/2023  Labs: 10/15/2023 Creatinine remains borderline low-stable. Rest of CMP WNL. CBC WNL   Next Visit: 01/15/2024  Last Visit: 10/15/2023  DX:  Rheumatoid arthritis involving multiple sites with positive rheumatoid factor   Current Dose per office note 10/15/2023:  Methotrexate 6 tablets by mouth once weekly  Okay to refill Methotrexate?

## 2024-01-01 NOTE — Progress Notes (Unsigned)
 Office Visit Note  Patient: [name]             Date of Birth: 08-Jan-1946           MRN: [id number]             PCP: Shon Hale, MD Referring: Shon Hale, * Visit Date: 01/15/2024 Occupation: @GUAROCC @  Subjective:  Right elbow pain   History of Present Illness: Zen Marciel is a 78 y.o. female with history of seropositive rheumatoid arthritis and ILD.  Patient remains on  Methotrexate 6 tablets by mouth once weekly, folic acid 1 mg by mouth daily, and hydroxychloroquine 200 mg daily Monday to Friday.  She is tolerating combination therapy without any side effects and has not had any recent gaps in therapy.  Patient requested a refill of Plaquenil to be sent to the pharmacy.  Patient presents today with pain in the right elbow for the past several weeks. She denies any injury prior to the onset of symptoms.  She continues to have chronic pain in her lower back.  She describes the sensation as aching pain which improves with lying down.  Patient states that her biggest concern has been her level of fatigue.  She states that she lacks motivation due to her fatigue and daytime drowsiness.  She states that she has felt more breathless and winded and will be following up with her pulmonologist Dr. Celine Mans. She denies any recent or recurrent infections.      Activities of Daily Living:  Patient reports morning stiffness for 0 minute.   Patient Reports nocturnal pain.  Difficulty dressing/grooming: Denies Difficulty climbing stairs: Reports Difficulty getting out of chair: Denies Difficulty using hands for taps, buttons, cutlery, and/or writing: Reports  Review of Systems  Constitutional:  Positive for fatigue.  HENT:  Positive for mouth dryness. Negative for mouth sores.   Eyes:  Positive for dryness.  Respiratory:  Positive for shortness of breath.   Cardiovascular:  Negative for chest pain and palpitations.  Gastrointestinal:  Negative for blood in  stool, constipation and diarrhea.  Endocrine: Positive for increased urination.  Genitourinary:  Negative for involuntary urination.  Musculoskeletal:  Positive for joint pain, gait problem, joint pain, joint swelling, myalgias, muscle weakness, muscle tenderness and myalgias. Negative for morning stiffness.  Skin:  Negative for color change, rash, hair loss and sensitivity to sunlight.  Allergic/Immunologic: Negative for susceptible to infections.  Neurological:  Negative for dizziness and headaches.  Hematological:  Negative for swollen glands.  Psychiatric/Behavioral:  Negative for depressed mood and sleep disturbance. The patient is nervous/anxious.     PMFS History:  Patient Active Problem List   Diagnosis Date Noted   ILD (interstitial lung disease) (HCC) 01/21/2023   Acute respiratory failure with hypoxia (HCC) 02/24/2022   GERD (gastroesophageal reflux disease) 02/24/2022   Elevated troponin 02/24/2022   Bilateral carpal tunnel syndrome 04/14/2018   Cervical spinal stenosis 01/07/2018   Cervical radiculopathy 01/07/2018   Stenosis of left subclavian artery (HCC) 12/15/2017   History of gastroesophageal reflux (GERD) 06/25/2017   History of bilateral carpal tunnel release 06/25/2017   Former smoker 01/25/2017   Rheumatoid arthritis involving multiple sites with positive rheumatoid factor (HCC) 01/17/2017   High risk medication use 01/17/2017   Primary osteoarthritis of both hands 01/17/2017   Primary osteoarthritis of both feet 01/17/2017   DJD (degenerative joint disease), cervical 01/17/2017   Spondylosis of lumbar region without myelopathy or radiculopathy 01/17/2017   Cardiomyopathy, ischemic 07/07/2016  CAD- S/P LAD thrombectomy 07/04/16 07/07/2016   Pacemaker-MDT BiV placed 07/05/16 07/07/2016   History of rheumatoid arthritis 07/07/2016   Complete heart block (HCC) 07/04/2016   Essential hypertension 07/04/2016   LBBB (left bundle branch block) 07/04/2016    Exostosis 06/18/2011   History of breast cancer 02/16/2010   Vitamin D deficiency 02/15/2010   HYPERCHOLESTEROLEMIA 02/15/2010   Anxiety state 02/15/2010   Asthma 02/15/2010   MIGRAINES, HX OF 02/15/2010   MASTECTOMY, LEFT, HX OF 02/15/2010    Past Medical History:  Diagnosis Date   Anxiety    Asthma    Breast cancer (HCC)    left breast   Carpal tunnel syndrome    COVID-19 06/2023   Depression    Habitual alcohol use    Heart attack (HCC)    Hypertension    Interstitial lung disease (HCC)    Personal history of chemotherapy    Personal history of radiation therapy    RA (rheumatoid arthritis) (HCC)    Spinal stenosis    Vision abnormalities     Family History  Problem Relation Age of Onset   Stroke Mother    Cancer Father        colon?   Asthma Father    Pyelonephritis Sister    Pulmonary fibrosis Sister    Cancer Paternal Aunt    Cancer Paternal Uncle    Asthma Paternal Grandfather    Past Surgical History:  Procedure Laterality Date   BREAST BIOPSY     BREAST LUMPECTOMY  2005   left   CARDIAC CATHETERIZATION N/A 07/04/2016   Procedure: Left Heart Cath and Coronary Angiography;  Surgeon: Lyn Records, MD;  Location: [facility]VASIVE CV LAB;  Service: Cardiovascular;  Laterality: N/A;   CARDIAC CATHETERIZATION N/A 07/04/2016   Procedure: Temporary Pacemaker;  Surgeon: Lyn Records, MD;  Location: [facility] INVASIVE CV LAB;  Service: Cardiovascular;  Laterality: N/A;   CARPAL TUNNEL RELEASE     CATARACT EXTRACTION Bilateral    EP IMPLANTABLE DEVICE N/A 07/05/2016   Procedure: Pacemaker Implant;  Surgeon: Marinus Maw, MD;  Location: [facility] INVASIVE CV LAB;  Service: Cardiovascular;  Laterality: N/A;   herniated rectum repair   11/2020   LAMINECTOMY  1966   LYMPHECTOMY  2005   Social History   Social History Narrative   Not on file   Immunization History  Administered Date(s) Administered   Fluad Quad(high Dose 65+) 08/16/2019, 08/05/2022   Influenza Split 09/10/2007,  09/05/2009, 08/05/2011, 09/15/2012, 08/12/2013   Influenza Whole 08/21/2009, 08/29/2010   Influenza, High Dose Seasonal PF 08/30/2021   Influenza,inj,Quad PF,6+ Mos 08/21/2016, 09/24/2017, 07/27/2018, 08/21/2020   Influenza,inj,quad, With Preservative 08/01/2014, 08/16/2015   PFIZER(Purple Top)SARS-COV-2 Vaccination 01/14/2020, 02/08/2020, 08/08/2020, 04/09/2021   Pneumococcal Conjugate-13 08/01/2014   Pneumococcal Polysaccharide-23 05/08/2009, 04/23/2022   Td 01/26/2018   Tdap 05/06/2007   Zoster, Live 06/16/2009     Objective: Vital Signs: BP 138/64 (BP Location: Right Arm, Patient Position: Sitting, Cuff Size: Normal)   Pulse 80   Resp 14   Ht 5\' 2"  (1.575 m)   Wt 123 lb (55.8 kg)   BMI 22.50 kg/m    Physical Exam Vitals and nursing note reviewed.  Constitutional:      Appearance: She is well-developed.  HENT:     Head: Normocephalic and atraumatic.  Eyes:     Conjunctiva/sclera: Conjunctivae normal.  Cardiovascular:     Rate and Rhythm: Normal rate and regular rhythm.     Heart sounds: Normal heart  sounds.  Pulmonary:     Effort: Pulmonary effort is normal.     Breath sounds: Normal breath sounds.  Abdominal:     General: Bowel sounds are normal.     Palpations: Abdomen is soft.  Musculoskeletal:     Cervical back: Normal range of motion.  Lymphadenopathy:     Cervical: No cervical adenopathy.  Skin:    General: Skin is warm and dry.     Capillary Refill: Capillary refill takes less than 2 seconds.  Neurological:     Mental Status: She is alert and oriented to person, place, and time.  Psychiatric:        Behavior: Behavior normal.      Musculoskeletal Exam: C-spine has limited ROM with lateral rotation.  Good flexion and extension of cervical spine.  Limited mobility of lumbar spine.  Shoulder joints have slightly limited ROM with stiffness bilaterally. Right elbow flexion contracture unchanged. Tenderness over the medial epicondyle of the right elbow.  No  tenderness along the elbow joint line.  Wrist joints, MCPs, PIPs, and DIPs good ROM with no synovitis.  Synovial thickening of MCP joints but no synovitis. Complete fist formation bilaterally.  Knee joints have good ROM with no warmth or effusion.  Ankle joints have good ROM with no tenderness or joint swelling.   CDAI Exam: CDAI Score: -- Patient Global: --; Provider Global: -- Swollen: --; Tender: -- Joint Exam 01/15/2024   No joint exam has been documented for this visit   There is currently no information documented on the homunculus. Go to the Rheumatology activity and complete the homunculus joint exam.  Investigation: No additional findings.  Imaging: No results found.   Recent Labs: Lab Results  Component Value Date   WBC 7.4 10/15/2023   HGB 14.0 10/15/2023   PLT 325 10/15/2023   NA 137 10/15/2023   K 4.3 10/15/2023   CL 99 10/15/2023   CO2 31 10/15/2023   GLUCOSE 90 10/15/2023   BUN 15 10/15/2023   CREATININE 0.55 (L) 10/15/2023   BILITOT 0.5 10/15/2023   ALKPHOS 76 11/14/2022   AST 35 10/15/2023   ALT 20 10/15/2023   PROT 6.4 10/15/2023   ALBUMIN 3.2 (L) 11/14/2022   CALCIUM 9.2 10/15/2023   GFRAA 108 12/26/2020    Speciality Comments: PLQ Eye Exam: 04/21/2023 WNL @ [facility] f/u 12  months.  Procedures:  Medium Joint Inj (Right medial epicondyle) on 01/15/2024 11:12 AM Indications: pain Details: 27 G 1.5 in needle, medial approach Medications: 0.5 mL lidocaine 1 %; 30 mg triamcinolone acetonide 40 MG/ML Aspirate: 0 mL Outcome: tolerated well, no immediate complications Procedure, treatment alternatives, risks and benefits explained, specific risks discussed. Consent was given by the patient. Immediately prior to procedure a time out was called to verify the correct patient, procedure, equipment, support staff and site/side marked as required. Patient was prepped and draped in the usual sterile fashion.     Allergies: Betadine [povidone  iodine] and Hydrocodone    Assessment / Plan:     Visit Diagnoses: Rheumatoid arthritis involving multiple sites with positive rheumatoid factor (HCC) - She has no synovitis examination today.  She has not had any signs or symptoms of a rheumatoid arthritis flare.  She has clinically been doing well taking methotrexate 6 tablets by mouth once weekly and Plaquenil 200 mg 1 tablet by mouth daily Monday through Friday.  She is tolerating combination therapy without any side effects and has not had any recent gaps in therapy.  No medication changes will be made at this time.  A refill of Plaquenil sent to the pharmacy today.  She was advised to notify us if she develops signs or symptoms of a flare.  She will follow up in the office in 5 months or sooner if needed.  Plan: hydroxychloroquine (PLAQUENIL) 200 MG tablet  High risk medication use - Methotrexate 6 tablets by mouth once weekly, folic acid 1 mg by mouth daily, and hydroxychloroquine 200 mg  daily Monday to Friday.  CBC and CMP updated on 10/15/23.  Orders for CBC and CMP released today.  Her next lab work will be due at the end of May and every 3 months to monitor for drug toxicity. No recent or recurrent infections. Discussed the importance of holding methotrexate if she develops signs or symptoms of an infection and to resume once the infection has completely cleared.  Lipid panel was ordered by PCP-plan to call to obtain records.  PLQ Eye Exam: 04/21/2023 WNL @ [facility] f/u 12 months.  - Plan: CBC with Differential/Platelet, COMPLETE METABOLIC PANEL WITH GFR  ILD (interstitial lung disease) (HCC) - Probable early UIP related to underlying RA- High-resolution chest CT 05/23/2022.  Evaluated by Dr. Celine Mans on 10/02/2023--no medication changes were made at that time.  Chest CT and spirometry were felt to be stable. Patient has noticed some increased breathlessness and fatigue intermittently and plans on following back up with Dr. Celine Mans  for further evaluation.  Elevated LFTs: AST and ALT WNL on 10/15/23. CMP order released today.    Arthritis of right acromioclavicular joint: Limited ROM with abduction to about 120 degrees.  Contracture of right elbow: Flexion contracture-unchanged.  No tenderness or inflammation along the joint line.   Medial epicondylitis of right elbow: Patient presents today with pain in the right elbow.  No recent injury prior to the onset of symptoms.  On examination she has tenderness along the medial aspect of her right elbow consistent with medial epicondylitis.  Different treatment options were discussed.  She requested a right medial epicondyle cortisone injection today.  She tolerated procedure well.  Procedure note was completed above.  Aftercare was discussed.  She will notify us if her symptoms persist or worsen.  Once her symptoms have improved she can start performing daily exercises-handout provided.   Primary osteoarthritis of both hands: She has PIP and DIP thickening consistent with osteoarthritis of both hands.  No inflammation noted.  Discussed the importance of joint protection and muscle strengthening.   Trigger finger, right middle finger: Not currently symptomatic.   Primary osteoarthritis of right knee: No warmth or effusion noted.  Trochanteric bursitis, left hip: Not currently symptomatic.   Primary osteoarthritis of both feet:  Good ROM of both ankles with no tenderness or joint swelling.  DDD (degenerative disc disease), cervical: C-spine has limited range of motion with lateral rotation.  Degeneration of intervertebral disc of lumbar region without discogenic back pain or lower extremity pain: Chronic pain and stiffness. Limited mobility.  She has not been taking any over-the-counter products for pain relief.  Discussed the use of a heating pad.  Chronic left SI joint pain: Chronic pain.  Vitamin D deficiency -Plan to check vitamin D today.  She has been experiencing  increased fatigue.  Plan: VITAMIN D 25 Hydroxy (Vit-D Deficiency, Fractures)  Vitamin B12 deficiency - She still experiencing increased fatigue and daytime drowsiness.  Plan to check vitamin B12 level today.  Plan: Vitamin B12  Other fatigue - Patient presents  today with increased fatigue.  She has been lacking motivation due to a reduction in her energy level.  At times she does not wake up feeling restored.  Plan to check CBC with differential, vitamin D, and vitamin B12 today.  Plan: Vitamin B12, VITAMIN D 25 Hydroxy (Vit-D Deficiency, Fractures)  Other medical conditions are listed as follows:   History of gastroesophageal reflux (GERD)  History of hyperlipidemia  History of anxiety  History of breast cancer  History of ventricular tachycardia  Former smoker  History of asthma  History of hypertension:BP was 138/64 today in the office.  Advised patient to monitor blood pressure closely following the cortisone injection today.   History of left bundle branch block   Orders: Orders Placed This Encounter  Procedures   Medium Joint Inj   CBC with Differential/Platelet   COMPLETE METABOLIC PANEL WITH GFR   Vitamin B12   VITAMIN D 25 Hydroxy (Vit-D Deficiency, Fractures)   Meds ordered this encounter  Medications   hydroxychloroquine (PLAQUENIL) 200 MG tablet    Sig: TAKE 1 TABLET BY MOUTH DAILY MONDAY-FRIDAY    Dispense:  60 tablet    Refill:  0    Follow-Up Instructions: Return in about 5 months (around 06/13/2024) for Rheumatoid arthritis, ILD.   Gearldine Bienenstock, PA-C  Note - This record has been created using Dragon software.  Chart creation errors have been sought, but may not always  have been located. Such creation errors do not reflect on  the standard of medical care.

## 2024-01-05 ENCOUNTER — Telehealth: Payer: Self-pay | Admitting: *Deleted

## 2024-01-05 NOTE — Telephone Encounter (Signed)
Patient contacted the office and left message asking if she can have an injection in her elbow at her upcoming appointment on 01/15/2024. Please advise.

## 2024-01-05 NOTE — Telephone Encounter (Signed)
That should be fine but she will require examination first

## 2024-01-06 NOTE — Telephone Encounter (Signed)
Attempted to contact the patient and left message to advise patient is should be fine to give her an injection in her elbow. Advised patient she will require an examination at her appointment first.

## 2024-01-15 ENCOUNTER — Encounter: Payer: Self-pay | Admitting: Physician Assistant

## 2024-01-15 ENCOUNTER — Ambulatory Visit: Payer: Medicare Other | Attending: Physician Assistant | Admitting: Physician Assistant

## 2024-01-15 VITALS — BP 138/64 | HR 80 | Resp 14 | Ht 62.0 in | Wt 123.0 lb

## 2024-01-15 DIAGNOSIS — M19042 Primary osteoarthritis, left hand: Secondary | ICD-10-CM

## 2024-01-15 DIAGNOSIS — M19071 Primary osteoarthritis, right ankle and foot: Secondary | ICD-10-CM

## 2024-01-15 DIAGNOSIS — E559 Vitamin D deficiency, unspecified: Secondary | ICD-10-CM

## 2024-01-15 DIAGNOSIS — Z853 Personal history of malignant neoplasm of breast: Secondary | ICD-10-CM

## 2024-01-15 DIAGNOSIS — M7701 Medial epicondylitis, right elbow: Secondary | ICD-10-CM

## 2024-01-15 DIAGNOSIS — M19011 Primary osteoarthritis, right shoulder: Secondary | ICD-10-CM | POA: Diagnosis not present

## 2024-01-15 DIAGNOSIS — R5383 Other fatigue: Secondary | ICD-10-CM

## 2024-01-15 DIAGNOSIS — J849 Interstitial pulmonary disease, unspecified: Secondary | ICD-10-CM

## 2024-01-15 DIAGNOSIS — Z87891 Personal history of nicotine dependence: Secondary | ICD-10-CM

## 2024-01-15 DIAGNOSIS — M24521 Contracture, right elbow: Secondary | ICD-10-CM | POA: Diagnosis not present

## 2024-01-15 DIAGNOSIS — M65331 Trigger finger, right middle finger: Secondary | ICD-10-CM | POA: Diagnosis not present

## 2024-01-15 DIAGNOSIS — M1711 Unilateral primary osteoarthritis, right knee: Secondary | ICD-10-CM | POA: Diagnosis not present

## 2024-01-15 DIAGNOSIS — M5030 Other cervical disc degeneration, unspecified cervical region: Secondary | ICD-10-CM

## 2024-01-15 DIAGNOSIS — Z8679 Personal history of other diseases of the circulatory system: Secondary | ICD-10-CM

## 2024-01-15 DIAGNOSIS — M0579 Rheumatoid arthritis with rheumatoid factor of multiple sites without organ or systems involvement: Secondary | ICD-10-CM

## 2024-01-15 DIAGNOSIS — Z79899 Other long term (current) drug therapy: Secondary | ICD-10-CM

## 2024-01-15 DIAGNOSIS — M19041 Primary osteoarthritis, right hand: Secondary | ICD-10-CM

## 2024-01-15 DIAGNOSIS — Z8709 Personal history of other diseases of the respiratory system: Secondary | ICD-10-CM

## 2024-01-15 DIAGNOSIS — M533 Sacrococcygeal disorders, not elsewhere classified: Secondary | ICD-10-CM

## 2024-01-15 DIAGNOSIS — Z8639 Personal history of other endocrine, nutritional and metabolic disease: Secondary | ICD-10-CM

## 2024-01-15 DIAGNOSIS — Z8719 Personal history of other diseases of the digestive system: Secondary | ICD-10-CM

## 2024-01-15 DIAGNOSIS — M19072 Primary osteoarthritis, left ankle and foot: Secondary | ICD-10-CM

## 2024-01-15 DIAGNOSIS — E538 Deficiency of other specified B group vitamins: Secondary | ICD-10-CM

## 2024-01-15 DIAGNOSIS — R7989 Other specified abnormal findings of blood chemistry: Secondary | ICD-10-CM

## 2024-01-15 DIAGNOSIS — M7062 Trochanteric bursitis, left hip: Secondary | ICD-10-CM

## 2024-01-15 DIAGNOSIS — G8929 Other chronic pain: Secondary | ICD-10-CM

## 2024-01-15 DIAGNOSIS — Z8659 Personal history of other mental and behavioral disorders: Secondary | ICD-10-CM

## 2024-01-15 DIAGNOSIS — M51369 Other intervertebral disc degeneration, lumbar region without mention of lumbar back pain or lower extremity pain: Secondary | ICD-10-CM

## 2024-01-15 MED ORDER — TRIAMCINOLONE ACETONIDE 40 MG/ML IJ SUSP
30.0000 mg | INTRAMUSCULAR | Status: AC | PRN
  Administered 2024-01-15: 30 mg via INTRA_ARTICULAR

## 2024-01-15 MED ORDER — HYDROXYCHLOROQUINE SULFATE 200 MG PO TABS: ORAL_TABLET | ORAL | 0 refills | Status: DC

## 2024-01-15 MED ORDER — LIDOCAINE HCL 1 % IJ SOLN
0.5000 mL | INTRAMUSCULAR | Status: AC | PRN
  Administered 2024-01-15: .5 mL

## 2024-01-15 NOTE — Patient Instructions (Addendum)
 Standing Labs We placed an order today for your standing lab work.   Please have your standing labs drawn at end of may and every 3 months   Please have your labs drawn 2 weeks prior to your appointment so that the provider can discuss your lab results at your appointment, if possible.  Please note that you may see your imaging and lab results in MyChart before we have reviewed them. We will contact you once all results are reviewed. Please allow our office up to 72 hours to thoroughly review all of the results before contacting the office for clarification of your results.  WALK-IN LAB HOURS  Monday through Thursday from 8:00 am -12:30 pm and 1:00 pm-5:00 pm and Friday from 8:00 am-12:00 pm.  Patients with office visits requiring labs will be seen before walk-in labs.  You may encounter longer than normal wait times. Please allow additional time. Wait times may be shorter on  Monday and Thursday afternoons.  We do not book appointments for walk-in labs. We appreciate your patience and understanding with our staff.   Labs are drawn by [facility]. Please bring your co-pay at the time of your lab draw.  You may receive a bill from [facility] for your lab work.  Please note if you are on Hydroxychloroquine and and an order has been placed for a Hydroxychloroquine level,  you will need to have it drawn 4 hours or more after your last dose.  If you wish to have your labs drawn at another location, please call the office 24 hours in advance so we can fax the orders.  The office is located at [address], [place], [place] [address]   If you have any questions regarding directions or hours of operation,  please call [phone].   As a reminder, please drink plenty of water prior to coming for your lab work. Thanks!   Exercises for Golfer's Elbow Elbow exercises can help you get better if you have golfer's elbow. Only do the exercises you were told to do. Make sure you know how to do  the exercises safely. Follow the steps below. It's normal to feel mild discomfort. Stop if you feel pain or your pain gets worse. Do not start these exercises until told by your health care provider. Stretching and range-of-motion exercises These exercises warm up your muscles and joints. They can help your elbow move better and be more flexible. Wrist extension, assisted  Straighten your left / right elbow in front of you with your palm facing up toward the ceiling. If told by your provider, bend your left / right elbow to a 90-degree angle (right angle) at your side. Do this instead of holding it straight. With your other hand, gently pull your left / right hand and fingers toward the floor. Stop when you feel a gentle stretch on the palm side of your forearm. Hold this position for __________ seconds. Repeat __________ times. Do this exercise __________ times a day. Wrist flexion, assisted  Straighten your left / right elbow in front of you with your palm facing down toward the floor. If told by your provider, bend your left / right elbow to a 90-degree angle at your side. Do this instead of holding it straight. With your other hand, gently push over the back of your left / right hand so your fingers point toward the floor. Stop when you feel a gentle stretch on the back of your forearm. Hold this position for __________  seconds. Repeat __________ times. Do this exercise __________ times a day. Assisted forearm rotation, supination  Sit or stand with your elbows at your side. Bend your left / right elbow to a 90-degree angle. Using your uninjured hand, turn your left / right palm up toward the ceiling. Stop when you feel a gentle stretch along the inside of your forearm. Hold this position for __________ seconds. Repeat __________ times. Do this exercise __________ times a day. Assisted forearm rotation, pronation  Sit or stand with your elbows at your side. Bend your left / right elbow  to a 90-degree angle. Using your uninjured hand, turn your left / right palm down toward the floor. Stop when you feel a gentle stretch along the top of your forearm. Hold this position for __________ seconds. Repeat __________ times. Do this exercise __________ times a day. Strengthening exercises These exercises build strength and endurance in your elbow. Endurance is the ability to use your muscles for a long time, even after they get tired. Wrist flexion  Sit with your left / right forearm supported on a table or other surface. Turn your palm up toward the ceiling. Let your left / right wrist extend over the edge of the surface. Hold a __________ weight or a piece of rubber exercise band or tubing. If using a rubber exercise band or tubing, hold the other end of the tubing with your other hand. Slowly bend your wrist so your hand moves up toward the ceiling. Try to only move your wrist. Keep the rest of your arm still. Hold this position for __________ seconds. Slowly go back to the starting position. Repeat __________ times. Do this exercise __________ times a day. Wrist flexion, eccentric  Sit with your left / right forearm supported on a table or other surface. Turn your palm up toward the ceiling. Let your left / right wrist extend over the edge of the surface. Hold a __________ weight or a piece of rubber exercise band or tubing in your left / right hand. If using a rubber exercise band or tubing, hold the other end of the tubing with your other hand. Use your uninjured hand to move your left / right hand up toward the ceiling. Take your other hand away. Slowly go back to the starting position using only your left / right hand. Repeat __________ times. Do this exercise __________ times a day. Forearm rotation, pronation To do this exercise, you'll need a lightweight hammer or rubber mallet. Sit with your left / right forearm supported on a table or other surface. Bend your elbow to  a 90-degree angle. Have your forearm so that your palm is facing up toward the ceiling, with your hand resting over the edge of the table. Hold a hammer in your left / right hand. To make this exercise easier, hold the hammer near the head of the hammer. To make this exercise harder, hold the hammer near the end of the handle. Without moving your elbow, slowly turn your forearm so your palm faces down toward the floor. Hold this position for __________ seconds. Slowly return to the starting position. Repeat __________ times. Do this exercise __________ times a day. Shoulder blade squeeze     Sit in a stable chair or stand with good posture. If you're sitting down, don't let your back touch the back of the chair. Your arms should be at your sides with your elbows bent to a 90-degree angle. Position your forearms so that your thumbs are  facing the ceiling. Without lifting your shoulders up, squeeze your shoulder blades tightly together. Hold this position for __________ seconds. Slowly release. Go back to the starting position. Repeat __________ times. Do this exercise __________ times a day. This information is not intended to replace advice given to you by your health care provider. Make sure you discuss any questions you have with your health care provider. Document Revised: 05/29/2023 Document Reviewed: 05/29/2023 Elsevier Patient Education  2024 [employer].

## 2024-01-16 LAB — CBC WITH DIFFERENTIAL/PLATELET
Absolute Lymphocytes: 1536 {cells}/uL (ref 850–3900)
Absolute Monocytes: 1046 {cells}/uL — ABNORMAL HIGH (ref 200–950)
Basophils Absolute: 96 {cells}/uL (ref 0–200)
Basophils Relative: 1 %
Eosinophils Absolute: 384 {cells}/uL (ref 15–500)
Eosinophils Relative: 4 %
HCT: 41 % (ref 35.0–45.0)
Hemoglobin: 13.7 g/dL (ref 11.7–15.5)
MCH: 31.6 pg (ref 27.0–33.0)
MCHC: 33.4 g/dL (ref 32.0–36.0)
MCV: 94.7 fL (ref 80.0–100.0)
MPV: 9.1 fL (ref 7.5–12.5)
Monocytes Relative: 10.9 %
Neutro Abs: 6538 {cells}/uL (ref 1500–7800)
Neutrophils Relative %: 68.1 %
Platelets: 376 10*3/uL (ref 140–400)
RBC: 4.33 10*6/uL (ref 3.80–5.10)
RDW: 13 % (ref 11.0–15.0)
Total Lymphocyte: 16 %
WBC: 9.6 10*3/uL (ref 3.8–10.8)

## 2024-01-16 LAB — COMPLETE METABOLIC PANEL WITH GFR
AG Ratio: 1.5 (calc) (ref 1.0–2.5)
ALT: 20 U/L (ref 6–29)
AST: 30 U/L (ref 10–35)
Albumin: 3.8 g/dL (ref 3.6–5.1)
Alkaline phosphatase (APISO): 71 U/L (ref 37–153)
BUN/Creatinine Ratio: 25 (calc) — ABNORMAL HIGH (ref 6–22)
BUN: 14 mg/dL (ref 7–25)
CO2: 31 mmol/L (ref 20–32)
Calcium: 9.5 mg/dL (ref 8.6–10.4)
Chloride: 101 mmol/L (ref 98–110)
Creat: 0.56 mg/dL — ABNORMAL LOW (ref 0.60–1.00)
Globulin: 2.6 g/dL (ref 1.9–3.7)
Glucose, Bld: 80 mg/dL (ref 65–99)
Potassium: 4.8 mmol/L (ref 3.5–5.3)
Sodium: 138 mmol/L (ref 135–146)
Total Bilirubin: 0.6 mg/dL (ref 0.2–1.2)
Total Protein: 6.4 g/dL (ref 6.1–8.1)
eGFR: 93 mL/min/{1.73_m2} (ref >=60)

## 2024-01-16 LAB — VITAMIN D 25 HYDROXY (VIT D DEFICIENCY, FRACTURES): Vit D, 25-Hydroxy: 67 ng/mL (ref 30–100)

## 2024-01-16 LAB — VITAMIN B12: Vitamin B-12: 378 pg/mL (ref 200–1100)

## 2024-01-16 NOTE — Progress Notes (Signed)
 Vitamin D WNL-continue maintenance dose of vitamin D. Vitamin B12 is lower end of normal.  CBC and CMP stable.

## 2024-01-23 ENCOUNTER — Other Ambulatory Visit: Payer: Self-pay | Admitting: Family Medicine

## 2024-01-23 DIAGNOSIS — Z1231 Encounter for screening mammogram for malignant neoplasm of breast: Secondary | ICD-10-CM

## 2024-02-03 ENCOUNTER — Ambulatory Visit (INDEPENDENT_AMBULATORY_CARE_PROVIDER_SITE_OTHER): Payer: Medicare Other

## 2024-02-03 DIAGNOSIS — I442 Atrioventricular block, complete: Secondary | ICD-10-CM | POA: Diagnosis not present

## 2024-02-04 LAB — CUP PACEART REMOTE DEVICE CHECK
Battery Remaining Longevity: 25 mo
Battery Voltage: 2.92 V
Brady Statistic AP VP Percent: 1.5 %
Brady Statistic AP VS Percent: 0.02 %
Brady Statistic AS VP Percent: 98.37 %
Brady Statistic AS VS Percent: 0.11 %
Brady Statistic RA Percent Paced: 1.52 %
Brady Statistic RV Percent Paced: 99.75 %
Date Time Interrogation Session: 20250319161326
Implantable Lead Connection Status: 753985
Implantable Lead Connection Status: 753985
Implantable Lead Connection Status: 753985
Implantable Lead Implant Date: 20170818
Implantable Lead Implant Date: 20170818
Implantable Lead Implant Date: 20170818
Implantable Lead Location: 753858
Implantable Lead Location: 753859
Implantable Lead Location: 753860
Implantable Lead Model: 4396
Implantable Lead Model: 5076
Implantable Lead Model: 5076
Implantable Pulse Generator Implant Date: 20170818
Lead Channel Impedance Value: 342 Ohm
Lead Channel Impedance Value: 399 Ohm
Lead Channel Impedance Value: 399 Ohm
Lead Channel Impedance Value: 437 Ohm
Lead Channel Impedance Value: 494 Ohm
Lead Channel Impedance Value: 513 Ohm
Lead Channel Impedance Value: 532 Ohm
Lead Channel Impedance Value: 608 Ohm
Lead Channel Impedance Value: 798 Ohm
Lead Channel Pacing Threshold Amplitude: 0.5 V
Lead Channel Pacing Threshold Amplitude: 0.625 V
Lead Channel Pacing Threshold Amplitude: 0.875 V
Lead Channel Pacing Threshold Pulse Width: 0.4 ms
Lead Channel Pacing Threshold Pulse Width: 0.4 ms
Lead Channel Pacing Threshold Pulse Width: 0.4 ms
Lead Channel Sensing Intrinsic Amplitude: 0.875 mV
Lead Channel Sensing Intrinsic Amplitude: 0.875 mV
Lead Channel Sensing Intrinsic Amplitude: 4.25 mV
Lead Channel Sensing Intrinsic Amplitude: 4.25 mV
Lead Channel Setting Pacing Amplitude: 1.5 V
Lead Channel Setting Pacing Amplitude: 2 V
Lead Channel Setting Pacing Amplitude: 2 V
Lead Channel Setting Pacing Pulse Width: 0.4 ms
Lead Channel Setting Pacing Pulse Width: 0.4 ms
Lead Channel Setting Sensing Sensitivity: 0.9 mV
Zone Setting Status: 755011
Zone Setting Status: 755011

## 2024-02-09 ENCOUNTER — Encounter: Payer: Self-pay | Admitting: Internal Medicine

## 2024-02-11 ENCOUNTER — Encounter: Payer: Self-pay | Admitting: Emergency Medicine

## 2024-02-11 ENCOUNTER — Ambulatory Visit: Admitting: Emergency Medicine

## 2024-02-11 ENCOUNTER — Telehealth: Payer: Self-pay | Admitting: *Deleted

## 2024-02-11 ENCOUNTER — Ambulatory Visit

## 2024-02-11 VITALS — BP 118/58 | HR 84 | Temp 99.0°F | Ht 62.0 in | Wt 122.0 lb

## 2024-02-11 DIAGNOSIS — I700 Atherosclerosis of aorta: Secondary | ICD-10-CM | POA: Diagnosis not present

## 2024-02-11 DIAGNOSIS — R0602 Shortness of breath: Secondary | ICD-10-CM | POA: Diagnosis not present

## 2024-02-11 DIAGNOSIS — J4530 Mild persistent asthma, uncomplicated: Secondary | ICD-10-CM

## 2024-02-11 DIAGNOSIS — Z950 Presence of cardiac pacemaker: Secondary | ICD-10-CM | POA: Diagnosis not present

## 2024-02-11 DIAGNOSIS — R9389 Abnormal findings on diagnostic imaging of other specified body structures: Secondary | ICD-10-CM | POA: Diagnosis not present

## 2024-02-11 MED ORDER — PREDNISONE 10 MG PO TABS: ORAL_TABLET | ORAL | 0 refills | Status: DC

## 2024-02-11 MED ORDER — AZITHROMYCIN 250 MG PO TABS: ORAL_TABLET | ORAL | 0 refills | Status: DC

## 2024-02-11 NOTE — Patient Instructions (Signed)
 Chest x-ray today Please take prednisone as directed until completely gone Please take azithromycin as directed until completely gone Continue your Symbicort twice a day as you have been taking it.  Rinse and gargle after using. Keep albuterol available to use 2 puffs up to every 4 hours if needed for shortness of breath, chest tightness, wheezing.  Please follow with Dr. Celine Mans as planned.  If you are not improving in the next week then please make an appointment to see [place] again for an acute visit

## 2024-02-11 NOTE — Progress Notes (Signed)
 Subjective:    Patient ID: [name], female    DOB: December 26, 1945, 77 y.o.   MRN: [id number]  HPI Acute office visit 02/11/2024: 78 year old woman with a history of tobacco use (50 pack years) followed by Dr. Celine Mans in our office for interstitial lung disease, presumed rheumatoid arthritis related ILD.  Has been managed on methotrexate and hydroxychloroquine.  Pulmonary function testing overall reassuring with only subtle decrease in FEV1 and some slight curve to her flow-volume loop to suggest any mild obstruction.  Managed on Symbicort, usually uses albuterol a few times a week   Today she reports that she started to have increased chest mucous and cough, wheeze w exertion, beginning about 2-3 weeks ago. Then a fever 10 days ago x 1. She checked for covid and was negative. Her mucous is cloudy tan, no blood. Some resting dyspnea, certainly present when active. She has used albuterol w partial response, using several times a day   Pulmonary function testing performed 10/02/23 reviewed by me shows mild obstruction (FEV1 79% predicted), subtle curve of her flow-volume loop, decreased diffusion capacity.  High-resolution CT scan of the chest 02/07/2023 reviewed by me shows centrilobular and paraseptal emphysema, subpleural reticulation with some traction bronchiolectasis, question single-layer honeycombing in the posterior right lower lobe.  Probable UIP pattern, overall stable from 02/2022  Review of Systems As per HPI  Past Medical History:  Diagnosis Date   Anxiety    Asthma    Breast cancer (HCC)    left breast   Carpal tunnel syndrome    COVID-19 06/2023   Depression    Habitual alcohol use    Heart attack (HCC)    Hypertension    Interstitial lung disease (HCC)    Personal history of chemotherapy    Personal history of radiation therapy    RA (rheumatoid arthritis) (HCC)    Spinal stenosis    Vision abnormalities      Family History  Problem Relation Age of Onset   Stroke  Mother    Cancer Father        colon?   Asthma Father    Pyelonephritis Sister    Pulmonary fibrosis Sister    Cancer Paternal Aunt    Cancer Paternal Uncle    Asthma Paternal Grandfather      Social History   Socioeconomic History   Marital status: Widowed    Spouse name: Not on file   Number of children: Not on file   Years of education: Not on file   Highest education level: Not on file  Occupational History   Not on file  Tobacco Use   Smoking status: Former    Current packs/day: 0.00    Average packs/day: 2.0 packs/day for 25.0 years (50.0 ttl pk-yrs)    Types: Cigarettes    Start date: 41    Quit date: 61    Years since quitting: 30.2    Passive exposure: Past   Smokeless tobacco: Never  Vaping Use   Vaping status: Never Used  Substance and Sexual Activity   Alcohol use: Yes    Alcohol/week: 14.0 standard drinks of alcohol    Types: 14 Cans of beer per week    Comment: 2 DAILY   Drug use: Never   Sexual activity: Not on file  Other Topics Concern   Not on file  Social History Narrative   Not on file   Social Drivers of Health   Financial Resource Strain: Not on file  Food Insecurity:  Not on file  Transportation Needs: Not on file  Physical Activity: Not on file  Stress: Not on file  Social Connections: Not on file  Intimate Partner Violence: Not on file     Allergies  Allergen Reactions   Betadine [Povidone Iodine] Shortness Of Breath   Hydrocodone Itching     Outpatient Medications Prior to Visit  Medication Sig Dispense Refill   acetaminophen (TYLENOL) 325 MG tablet Take 325-650 mg by mouth every 6 (six) hours as needed for mild pain, headache or fever.     albuterol (PROVENTIL HFA;VENTOLIN HFA) 108 (90 Base) MCG/ACT inhaler Inhale 2 puffs into the lungs every 6 (six) hours as needed for wheezing or shortness of breath.     aspirin 81 MG tablet Take 81 mg by mouth daily.     atorvastatin (LIPITOR) 40 MG tablet TAKE 1 TABLET(40 MG) BY  MOUTH DAILY AT 6 PM 90 tablet 3   budesonide-formoterol (SYMBICORT) 160-4.5 MCG/ACT inhaler Inhale 2 puffs into the lungs 2 (two) times daily. 1 each 6   calcium carbonate (OS-CAL) 600 MG TABS Take 600 mg by mouth 2 (two) times daily with a meal.     Cholecalciferol (VITAMIN D3) 50 MCG (2000 UT) TABS Take 2,000 Units by mouth daily.     citalopram (CELEXA) 20 MG tablet Take 20 mg by mouth daily.  3   clonazePAM (KLONOPIN) 0.5 MG tablet Take 0.25-0.5 mg by mouth daily as needed for anxiety.  0   folic acid (FOLVITE) 1 MG tablet TAKE 1 TABLET BY MOUTH DAILY 90 tablet 3   hydroxychloroquine (PLAQUENIL) 200 MG tablet TAKE 1 TABLET BY MOUTH DAILY MONDAY-FRIDAY 60 tablet 0   losartan-hydrochlorothiazide (HYZAAR) 50-12.5 MG tablet TAKE 1 TABLET BY MOUTH DAILY 90 tablet 3   methotrexate (RHEUMATREX) 2.5 MG tablet TAKE 6 TABLETS BY MOUTH 1 TIME WEEKLY 72 tablet 0   nitroGLYCERIN (NITROSTAT) 0.4 MG SL tablet Place 1 tablet (0.4 mg total) under the tongue every 5 (five) minutes as needed for chest pain. 25 tablet 2   pantoprazole (PROTONIX) 40 MG tablet TAKE 1 TABLET(40 MG) BY MOUTH DAILY. 90 tablet 2   fluticasone-salmeterol (ADVAIR HFA) 115-21 MCG/ACT inhaler Inhale 2 puffs into the lungs 2 (two) times daily. (Patient not taking: Reported on 02/11/2024) 1 each 12   melatonin 3 MG TABS tablet Take 3 mg by mouth at bedtime as needed (for sleep). (Patient not taking: Reported on 01/15/2024)     traZODone (DESYREL) 50 MG tablet Take 50 mg by mouth at bedtime as needed for sleep. (Patient not taking: Reported on 02/11/2024)     No facility-administered medications prior to visit.        Objective:   Physical Exam  Vitals:   02/11/24 1339 02/11/24 1342  BP: (!) 114/52 (!) 118/58  Pulse: 84   Temp: 99 F (37.2 C)   SpO2: 99%   Weight: 122 lb (55.3 kg)   Height: 5\' 2"  (1.575 m)     Gen: Pleasant, well-nourished, in no distress,  normal affect  ENT: No lesions,  mouth clear,  oropharynx clear, no  postnasal drip  Neck: No JVD, no stridor  Lungs: No use of accessory muscles, no crackles.  Mild end expiratory wheezing on forced expiration, associated with some cough  Cardiovascular: RRR, heart sounds normal, no murmur or gallops, no peripheral edema  Musculoskeletal: No deformities, no cyanosis or clubbing  Neuro: alert, awake, non focal  Skin: Warm, no lesions or rash  Assessment & Plan:   Asthma Presentation consistent with an acute flare of her mild asthma/COPD, question brought on by URI.  I will check a chest x-ray to ensure no evidence of infiltrate, any evidence for possible ILD flare (doubt).  Plan to treat for an acute exacerbation and follow progress, see her back if she is not improving  Chest x-ray today Please take prednisone as directed until completely gone Please take azithromycin as directed until completely gone Continue your Symbicort twice a day as you have been taking it.  Rinse and gargle after using. Keep albuterol available to use 2 puffs up to every 4 hours if needed for shortness of breath, chest tightness, wheezing.  Please follow with Dr. Celine Mans as planned.  If you are not improving in the next week then please make an appointment to see [place] again for an acute visit  Time spent 33 minutes  Levy Pupa, MD, PhD 02/11/2024, 2:00 PM [facility] [phone] or if no answer before 7:00PM call [phone] For any issues after 7:00PM please call eLink [phone]

## 2024-02-11 NOTE — Assessment & Plan Note (Signed)
 Presentation consistent with an acute flare of her mild asthma/COPD, question brought on by URI.  I will check a chest x-ray to ensure no evidence of infiltrate, any evidence for possible ILD flare (doubt).  Plan to treat for an acute exacerbation and follow progress, see her back if she is not improving  Chest x-ray today Please take prednisone as directed until completely gone Please take azithromycin as directed until completely gone Continue your Symbicort twice a day as you have been taking it.  Rinse and gargle after using. Keep albuterol available to use 2 puffs up to every 4 hours if needed for shortness of breath, chest tightness, wheezing.  Please follow with Dr. Celine Mans as planned.  If you are not improving in the next week then please make an appointment to see [place] again for an acute visit

## 2024-02-11 NOTE — Telephone Encounter (Signed)
 Patient contacted the office and left message stating she saw pulmonary today. Patient states she was prescribed Azithromycin and Prednisone. Patient wants to know if she needs to make any changes to her medications.   Returned call to patient. Advised patient she can continue her PLQ. Patient advised she will need to hold her MTX until she has completed the antibiotic and her infections has cleared.

## 2024-02-17 ENCOUNTER — Ambulatory Visit

## 2024-02-17 ENCOUNTER — Other Ambulatory Visit: Payer: Self-pay | Admitting: Pulmonary Disease

## 2024-02-17 DIAGNOSIS — J849 Interstitial pulmonary disease, unspecified: Secondary | ICD-10-CM

## 2024-02-26 ENCOUNTER — Ambulatory Visit
Admission: RE | Admit: 2024-02-26 | Discharge: 2024-02-26 | Disposition: A | Discharge status: Home / Self Care | Source: Ambulatory Visit | Attending: Family Medicine

## 2024-02-26 DIAGNOSIS — Z1231 Encounter for screening mammogram for malignant neoplasm of breast: Secondary | ICD-10-CM

## 2024-03-14 ENCOUNTER — Other Ambulatory Visit: Payer: Self-pay | Admitting: Physician Assistant

## 2024-03-14 DIAGNOSIS — R7989 Other specified abnormal findings of blood chemistry: Secondary | ICD-10-CM

## 2024-03-14 DIAGNOSIS — J849 Interstitial pulmonary disease, unspecified: Secondary | ICD-10-CM

## 2024-03-14 DIAGNOSIS — Z79899 Other long term (current) drug therapy: Secondary | ICD-10-CM

## 2024-03-14 DIAGNOSIS — M0579 Rheumatoid arthritis with rheumatoid factor of multiple sites without organ or systems involvement: Secondary | ICD-10-CM

## 2024-03-15 NOTE — Telephone Encounter (Signed)
 Last Fill: 12/15/2023  Labs: 01/15/2024 CBC and CMP stable.   Next Visit: 06/23/2024  Last Visit: 01/15/2024  DX: Rheumatoid arthritis involving multiple sites with positive rheumatoid factor   Current Dose per office note 01/15/2024: Methotrexate  6 tablets by mouth once weekly   Okay to refill Methotrexate ?

## 2024-03-18 ENCOUNTER — Other Ambulatory Visit: Payer: Self-pay | Admitting: Internal Medicine

## 2024-03-18 DIAGNOSIS — J849 Interstitial pulmonary disease, unspecified: Secondary | ICD-10-CM

## 2024-03-18 MED ORDER — BUDESONIDE-FORMOTEROL FUMARATE 160-4.5 MCG/ACT IN AERO: 2.0000 | INHALATION_SPRAY | Freq: Two times a day (BID) | RESPIRATORY_TRACT | 2 refills | Status: DC

## 2024-03-18 NOTE — Telephone Encounter (Signed)
 Copied from CRM [id number]. Topic: Clinical - Medication Refill >> Mar 18, 2024  1:24 PM Crist Dominion wrote: Most Recent Primary Care Visit:   Medication: budesonide -formoterol  (SYMBICORT ) 160-4.5 MCG/ACT inhaler  Has the patient contacted their pharmacy? Yes (Agent: If no, request that the patient contact the pharmacy for the refill. If patient does not wish to contact the pharmacy document the reason why and proceed with request.) (Agent: If yes, when and what did the pharmacy advise?)  Is this the correct pharmacy for this prescription? Yes If no, delete pharmacy and type the correct one.  This is the patient's preferred pharmacy:  [id number] [place], [place] - [address] AT [facility] OF [facility] [address] & [facility] [address] [place] [address] Phone: [phone] Fax: [phone]   Has the prescription been filled recently? Yes  Is the patient out of the medication? Yes  Has the patient been seen for an appointment in the last year OR does the patient have an upcoming appointment? Yes  Can we respond through MyChart? Yes  Agent: Please be advised that Rx refills may take up to 3 business days. We ask that you follow-up with your pharmacy.

## 2024-03-22 NOTE — Addendum Note (Signed)
 Addended by: Lott Rouleau A on: 03/22/2024 10:43 AM   Modules accepted: Orders

## 2024-03-22 NOTE — Progress Notes (Signed)
 Remote pacemaker transmission.

## 2024-04-04 ENCOUNTER — Other Ambulatory Visit: Payer: Self-pay | Admitting: Physician Assistant

## 2024-04-14 ENCOUNTER — Other Ambulatory Visit: Payer: Self-pay | Admitting: *Deleted

## 2024-04-14 DIAGNOSIS — Z79899 Other long term (current) drug therapy: Secondary | ICD-10-CM

## 2024-04-15 DIAGNOSIS — Z79899 Other long term (current) drug therapy: Secondary | ICD-10-CM | POA: Diagnosis not present

## 2024-04-16 ENCOUNTER — Other Ambulatory Visit: Payer: Self-pay | Admitting: Physician Assistant

## 2024-04-16 ENCOUNTER — Ambulatory Visit: Payer: Self-pay | Admitting: Physician Assistant

## 2024-04-16 DIAGNOSIS — M0579 Rheumatoid arthritis with rheumatoid factor of multiple sites without organ or systems involvement: Secondary | ICD-10-CM

## 2024-04-16 LAB — CMP14+EGFR
ALT: 22 IU/L (ref 0–32)
AST: 40 IU/L (ref 0–40)
Albumin: 3.8 g/dL (ref 3.8–4.8)
Alkaline Phosphatase: 77 IU/L (ref 44–121)
BUN/Creatinine Ratio: 24 (ref 12–28)
BUN: 16 mg/dL (ref 8–27)
Bilirubin Total: 0.5 mg/dL (ref 0.0–1.2)
CO2: 23 mmol/L (ref 20–29)
Calcium: 9.2 mg/dL (ref 8.7–10.3)
Chloride: 100 mmol/L (ref 96–106)
Creatinine, Ser: 0.67 mg/dL (ref 0.57–1.00)
Globulin, Total: 2.6 g/dL (ref 1.5–4.5)
Glucose: 107 mg/dL — ABNORMAL HIGH (ref 70–99)
Potassium: 4.1 mmol/L (ref 3.5–5.2)
Sodium: 139 mmol/L (ref 134–144)
Total Protein: 6.4 g/dL (ref 6.0–8.5)
eGFR: 88 mL/min/{1.73_m2} (ref >59)

## 2024-04-16 LAB — CBC WITH DIFFERENTIAL/PLATELET
Basophils Absolute: 0.1 10*3/uL (ref 0.0–0.2)
Basos: 1 %
EOS (ABSOLUTE): 0.5 10*3/uL — ABNORMAL HIGH (ref 0.0–0.4)
Eos: 6 %
Hematocrit: 42 % (ref 34.0–46.6)
Hemoglobin: 13.5 g/dL (ref 11.1–15.9)
Immature Grans (Abs): 0 10*3/uL (ref 0.0–0.1)
Immature Granulocytes: 0 %
Lymphocytes Absolute: 1.4 10*3/uL (ref 0.7–3.1)
Lymphs: 20 %
MCH: 30.6 pg (ref 26.6–33.0)
MCHC: 32.1 g/dL (ref 31.5–35.7)
MCV: 95 fL (ref 79–97)
Monocytes Absolute: 0.7 10*3/uL (ref 0.1–0.9)
Monocytes: 11 %
Neutrophils Absolute: 4.3 10*3/uL (ref 1.4–7.0)
Neutrophils: 62 %
Platelets: 367 10*3/uL (ref 150–450)
RBC: 4.41 x10E6/uL (ref 3.77–5.28)
RDW: 13.5 % (ref 11.7–15.4)
WBC: 7 10*3/uL (ref 3.4–10.8)

## 2024-04-16 NOTE — Progress Notes (Signed)
 Glucose is 107. Rest of CMP WNL.   Absolute eosinophils are elevated-may be related to allergies. Rest of CBC WNL.

## 2024-04-19 ENCOUNTER — Other Ambulatory Visit (HOSPITAL_BASED_OUTPATIENT_CLINIC_OR_DEPARTMENT_OTHER): Payer: Self-pay

## 2024-04-19 DIAGNOSIS — J849 Interstitial pulmonary disease, unspecified: Secondary | ICD-10-CM

## 2024-04-19 NOTE — Telephone Encounter (Signed)
 Last Fill: 01/15/2024  Eye exam: 04/22/2023   Labs: 04/15/2024 Glucose is 107. Rest of CMP WNL.   Absolute eosinophils are elevated-may be related to allergies. Rest of CBC WNL.  Next Visit: 06/23/2024  Last Visit: 01/15/2024  UJ:[id number] arthritis involving multiple sites with positive rheumatoid factor   Current Dose per office note on 01/15/2024: hydroxychloroquine  200 mg daily Monday to Friday.   Patient is scheduled for PLQ eye exam this month, per patient.   Okay to refill Plaquenil ?

## 2024-04-21 ENCOUNTER — Ambulatory Visit: Admitting: Internal Medicine

## 2024-04-21 ENCOUNTER — Encounter: Payer: Self-pay | Admitting: Internal Medicine

## 2024-04-21 ENCOUNTER — Other Ambulatory Visit: Payer: Self-pay | Admitting: Physician Assistant

## 2024-04-21 VITALS — BP 124/60 | HR 76 | Ht 62.0 in | Wt 123.0 lb

## 2024-04-21 DIAGNOSIS — Q791 Other congenital malformations of diaphragm: Secondary | ICD-10-CM

## 2024-04-21 DIAGNOSIS — Z5181 Encounter for therapeutic drug level monitoring: Secondary | ICD-10-CM

## 2024-04-21 DIAGNOSIS — Z87891 Personal history of nicotine dependence: Secondary | ICD-10-CM

## 2024-04-21 DIAGNOSIS — J849 Interstitial pulmonary disease, unspecified: Secondary | ICD-10-CM | POA: Diagnosis not present

## 2024-04-21 DIAGNOSIS — M0579 Rheumatoid arthritis with rheumatoid factor of multiple sites without organ or systems involvement: Secondary | ICD-10-CM

## 2024-04-21 DIAGNOSIS — D7219 Other eosinophilia: Secondary | ICD-10-CM

## 2024-04-21 DIAGNOSIS — M069 Rheumatoid arthritis, unspecified: Secondary | ICD-10-CM | POA: Diagnosis not present

## 2024-04-21 DIAGNOSIS — J84112 Idiopathic pulmonary fibrosis: Secondary | ICD-10-CM

## 2024-04-21 LAB — PULMONARY FUNCTION TEST
DL/VA % pred: 78 %
DL/VA: 3.26 ml/min/mmHg/L
DLCO cor % pred: 51 %
DLCO cor: 9.09 ml/min/mmHg
DLCO unc % pred: 51 %
DLCO unc: 9.12 ml/min/mmHg
FEF 25-75 Post: 1.41 L/s
FEF 25-75 Pre: 1.01 L/s
FEF2575-%Change-Post: 40 %
FEF2575-%Pred-Post: 109 %
FEF2575-%Pred-Pre: 77 %
FEV1-%Change-Post: 8 %
FEV1-%Pred-Post: 85 %
FEV1-%Pred-Pre: 79 %
FEV1-Post: 1.51 L
FEV1-Pre: 1.39 L
FEV1FVC-%Change-Post: 4 %
FEV1FVC-%Pred-Pre: 99 %
FEV6-%Change-Post: 2 %
FEV6-%Pred-Post: 86 %
FEV6-%Pred-Pre: 84 %
FEV6-Post: 1.94 L
FEV6-Pre: 1.89 L
FEV6FVC-%Change-Post: 0 %
FEV6FVC-%Pred-Post: 106 %
FEV6FVC-%Pred-Pre: 105 %
FVC-%Change-Post: 3 %
FVC-%Pred-Post: 82 %
FVC-%Pred-Pre: 79 %
FVC-Post: 1.96 L
FVC-Pre: 1.9 L
Post FEV1/FVC ratio: 77 %
Post FEV6/FVC ratio: 100 %
Pre FEV1/FVC ratio: 73 %
Pre FEV6/FVC Ratio: 100 %
RV % pred: 101 %
RV: 2.32 L
TLC % pred: 89 %
TLC: 4.28 L

## 2024-04-21 NOTE — Progress Notes (Signed)
 [name]    [id number]    1946-05-10  Primary Care Physician:Timberlake, Ancel Kass, MD Date of Appointment: 04/21/2024 Established Patient Visit  Chief complaint:   Chief Complaint  Patient presents with   Follow-up    PFT / SOB, Wheezing, dry cough, Pt refused to answer ACT questions because as she states she is not sure.     HPI: [name] is a 78 y.o. woman with history of tobacco use disorder, rheumatoid arthritis and shortness of breath.   Interval Updates: Here for follow up for RA-ILD, likely UIP.  Unchanged dyspnea, chest tightness, wheezing. Shortness of breath with exertion improved with rest.   Here for annual pft follow up which shows stable FVC and dlco.  Breathing does feel improved with symbicort  2 puffs twice daily.  Using albuterol  about once/day.   RA symptoms unchanged - poor range of motion and joint pain.  Still on methotrexate  and plaquenil .  No worsening of symptoms.   Saw Dr. Byrum in March 2025 for acute visit and was treated with steroids and abx.   I have reviewed the patient's family social and past medical history and updated as appropriate.   Past Medical History:  Diagnosis Date   Anxiety    Asthma    Breast cancer (HCC)    left breast   Carpal tunnel syndrome    COVID-19 06/2023   Depression    Habitual alcohol use    Heart attack (HCC)    Hypertension    Interstitial lung disease (HCC)    Personal history of chemotherapy    Personal history of radiation therapy    RA (rheumatoid arthritis) (HCC)    Spinal stenosis    Vision abnormalities     Past Surgical History:  Procedure Laterality Date   BREAST BIOPSY     BREAST LUMPECTOMY  2005   left   CARDIAC CATHETERIZATION N/A 07/04/2016   Procedure: Left Heart Cath and Coronary Angiography;  Surgeon: Arty Binning, MD;  Location: [facility] INVASIVE CV LAB;  Service: Cardiovascular;  Laterality: N/A;   CARDIAC CATHETERIZATION N/A 07/04/2016   Procedure: Temporary  Pacemaker;  Surgeon: Arty Binning, MD;  Location: [facility] INVASIVE CV LAB;  Service: Cardiovascular;  Laterality: N/A;   CARPAL TUNNEL RELEASE     CATARACT EXTRACTION Bilateral    EP IMPLANTABLE DEVICE N/A 07/05/2016   Procedure: Pacemaker Implant;  Surgeon: Tammie Fall, MD;  Location: [facility] INVASIVE CV LAB;  Service: Cardiovascular;  Laterality: N/A;   herniated rectum repair   11/2020   LAMINECTOMY  1966   LYMPHECTOMY  2005    Family History  Problem Relation Age of Onset   Stroke Mother    Cancer Father        colon?   Asthma Father    Pyelonephritis Sister    Pulmonary fibrosis Sister    Cancer Paternal Aunt    Cancer Paternal Uncle    Asthma Paternal Grandfather     Social History   Occupational History   Not on file  Tobacco Use   Smoking status: Former    Current packs/day: 0.00    Average packs/day: 2.0 packs/day for 25.0 years (50.0 ttl pk-yrs)    Types: Cigarettes    Start date: 28    Quit date: 1995    Years since quitting: 30.4    Passive exposure: Past   Smokeless tobacco: Never  Vaping Use   Vaping status: Never Used  Substance  and Sexual Activity   Alcohol use: Yes    Alcohol/week: 14.0 standard drinks of alcohol    Types: 14 Cans of beer per week    Comment: 2 DAILY   Drug use: Never   Sexual activity: Not on file     Physical Exam: Blood pressure 124/60, pulse 76, height 5\' 2"  (1.575 m), weight 123 lb (55.8 kg), SpO2 95%.  Gen:      No distress, well appearing on room air Lungs:    ctab no wheezes, mild kyphosis CV:         RRR Ext: swan hand deformity, no acute synovitis   Data Reviewed: Imaging: I have personally reviewed the CT scan Hi Res from March 2024 shows lower lobe predominant bronchiectasis, subpleural reticulation, no ggo c/w uip  PFTs:     Latest Ref Rng & Units 04/21/2024    8:58 AM 10/02/2023   10:20 AM 01/21/2023    2:03 PM 05/01/2022   12:05 PM  PFT Results  FVC-Pre L 1.90  P 1.94  1.65  1.88   FVC-Predicted Pre % 79  P  80  68  76   FVC-Post L 1.96  P   1.93   FVC-Predicted Post % 82  P   78   Pre FEV1/FVC % % 73  P 73  78  77   Post FEV1/FCV % % 77  P   77   FEV1-Pre L 1.39  P 1.42  1.28  1.45   FEV1-Predicted Pre % 79  P 79  71  79   FEV1-Post L 1.51  P   1.50   DLCO uncorrected ml/min/mmHg 9.12  P 9.68  10.43  10.33   DLCO UNC% % 51  P 54  59  58   DLCO corrected ml/min/mmHg 9.09  P 9.68  10.53  10.33   DLCO COR %Predicted % 51  P 54  59  58   DLVA Predicted % 78  P 78  88  75   TLC L 4.28  P   4.21   TLC % Predicted % 89  P   88   RV % Predicted % 101  P   98     P Preliminary result   I have personally reviewed the patient's PFTs and normal spirometry and lung volumes, mildly reduced diffusion capacity.   Labs: Lab Results  Component Value Date   WBC 7.0 04/15/2024   HGB 13.5 04/15/2024   HCT 42.0 04/15/2024   MCV 95 04/15/2024   PLT 367 04/15/2024   Lab Results  Component Value Date   NA 139 04/15/2024   K 4.1 04/15/2024   CL 100 04/15/2024   CO2 23 04/15/2024   Labs absolute eosinophil count 500  Immunization status: Immunization History  Administered Date(s) Administered   Fluad Quad(high Dose 65+) 08/16/2019, 08/05/2022   Fluzone Influenza virus vaccine,trivalent (IIV3), split virus 08/05/2011, 09/15/2012, 08/12/2013   Influenza Split 09/10/2007, 09/05/2009   Influenza Whole 08/21/2009, 08/29/2010   Influenza, High Dose Seasonal PF 08/30/2021   Influenza,inj,Quad PF,6+ Mos 08/21/2016, 09/24/2017, 07/27/2018, 08/21/2020   Influenza,inj,quad, With Preservative 08/01/2014, 08/16/2015   PFIZER(Purple Top)SARS-COV-2 Vaccination 01/14/2020, 02/08/2020, 08/08/2020, 04/09/2021   Pfizer(Comirnaty)Fall Seasonal Vaccine 12 years and older 10/03/2023   Pneumococcal Conjugate-13 08/01/2014   Pneumococcal Polysaccharide-23 05/08/2009, 04/23/2022   Td 01/26/2018   Tdap 05/06/2007   Zoster, Live 06/16/2009    External Records Personally Reviewed: pulmonary  Assessment:  ILD -  probable early UIP related  to underlying Rheumatoid Arthritis with airway involvement. History of tobacco use disorder Left hemidiaphragm eventration Rheumatoid arthritis Encounter for high risk drug monitoring.  Peripheral eosinophilia  Plan/Recommendations: Full set of PFTs next year.   Glad to hear breathing is doing well.  At this point we are monitoring your lung function with annual pulmonary function testing.  It has been stable for the last 3 years.  Your CT scans have also not changed from 20 23-20 24.  I suspect the methotrexate  is likely having some benefit inside your lungs for the ILD.  Please let us  know if there are any changes in your RA or any changes in your immunosuppression medication with your rheumatologist.  If you are having worsening RA symptoms and other sites, I would be more concerned for worsening in your lungs.  Your labs look good while on methotrexate  and Plaquenil .  I would make a change in your immunosuppression with coordinating with your rheumatologist if you were to develop worsening lung findings.  Continue the Symbicort  2 puffs twice daily, gargle after use Continue albuterol  inhaler as needed.  I suspect this is treating some component of smoking-related lung disease as well as the airway involvement of the RA.  Labs reviewed May 2025 stable cmet, ok to continue methotrexate .  Her RA ILD seems stable based on the above with methotrexate  and plaquenil . Cell cept, imuran, and rituxumab all have evidence to be efficacious in RA-ILD if we need to change therapy in the future.   Return to Care: Return in about 1 year (around 04/21/2025) for follow up with PFT and same day appointment.   Louie Rover, MD Pulmonary and Critical Care Medicine [facility] [phone]

## 2024-04-21 NOTE — Progress Notes (Signed)
 Full pft performed today.

## 2024-04-21 NOTE — Patient Instructions (Signed)
 Full pft performed today.

## 2024-04-21 NOTE — Patient Instructions (Addendum)
 It was a pleasure to see you today!  Please schedule follow up with myself in 1 year.  If my schedule is not open yet, we will contact you with a reminder closer to that time. Please call [phone] if you haven't heard from us  a month before, and always call us  sooner if issues or concerns arise. You can also send us  a message through MyChart, but but aware that this is not to be used for urgent issues and it may take up to 5-7 days to receive a reply. Please be aware that you will likely be able to view your results before I have a chance to respond to them. Please give us  5 business days to respond to any non-urgent results.    Before your next visit I would like you to have:  Full set of PFTs  Glad to hear breathing is doing well.  At this point we are monitoring your lung function with annual pulmonary function testing.  It has been stable for the last 3 years.  Your CT scans have also not changed from 20 23-20 24.  I suspect the methotrexate  is likely having some benefit inside your lungs for the ILD.  Please let us  know if there are any changes in your RA or any changes in your immunosuppression medication with your rheumatologist.  If you are having worsening RA symptoms and other sites, I would be more concerned for worsening in your lungs.  Your labs look good while on methotrexate  and Plaquenil .  I would make a change in your immunosuppression with coordinating with your rheumatologist if you were to develop worsening lung findings.  Continue the Symbicort  2 puffs twice daily, gargle after use Continue albuterol  inhaler as needed.  I suspect this is treating Of some component of smoking-related lung disease as well as the airway involvement of the RA.

## 2024-05-04 ENCOUNTER — Ambulatory Visit: Payer: Medicare Other

## 2024-05-04 DIAGNOSIS — I255 Ischemic cardiomyopathy: Secondary | ICD-10-CM

## 2024-05-05 LAB — CUP PACEART REMOTE DEVICE CHECK
Battery Remaining Longevity: 21 mo
Battery Voltage: 2.91 V
Brady Statistic AP VP Percent: 1.95 %
Brady Statistic AP VS Percent: 0.02 %
Brady Statistic AS VP Percent: 97.62 %
Brady Statistic AS VS Percent: 0.41 %
Brady Statistic RA Percent Paced: 1.96 %
Brady Statistic RV Percent Paced: 98.83 %
Date Time Interrogation Session: 20250617232442
Implantable Lead Connection Status: 753985
Implantable Lead Connection Status: 753985
Implantable Lead Connection Status: 753985
Implantable Lead Implant Date: 20170818
Implantable Lead Implant Date: 20170818
Implantable Lead Implant Date: 20170818
Implantable Lead Location: 753858
Implantable Lead Location: 753859
Implantable Lead Location: 753860
Implantable Lead Model: 4396
Implantable Lead Model: 5076
Implantable Lead Model: 5076
Implantable Pulse Generator Implant Date: 20170818
Lead Channel Impedance Value: 361 Ohm
Lead Channel Impedance Value: 418 Ohm
Lead Channel Impedance Value: 418 Ohm
Lead Channel Impedance Value: 456 Ohm
Lead Channel Impedance Value: 513 Ohm
Lead Channel Impedance Value: 532 Ohm
Lead Channel Impedance Value: 532 Ohm
Lead Channel Impedance Value: 646 Ohm
Lead Channel Impedance Value: 817 Ohm
Lead Channel Pacing Threshold Amplitude: 0.5 V
Lead Channel Pacing Threshold Amplitude: 0.5 V
Lead Channel Pacing Threshold Amplitude: 0.625 V
Lead Channel Pacing Threshold Pulse Width: 0.4 ms
Lead Channel Pacing Threshold Pulse Width: 0.4 ms
Lead Channel Pacing Threshold Pulse Width: 0.4 ms
Lead Channel Sensing Intrinsic Amplitude: 1.75 mV
Lead Channel Sensing Intrinsic Amplitude: 1.75 mV
Lead Channel Sensing Intrinsic Amplitude: 4.625 mV
Lead Channel Sensing Intrinsic Amplitude: 4.625 mV
Lead Channel Setting Pacing Amplitude: 1.5 V
Lead Channel Setting Pacing Amplitude: 1.75 V
Lead Channel Setting Pacing Amplitude: 2 V
Lead Channel Setting Pacing Pulse Width: 0.4 ms
Lead Channel Setting Pacing Pulse Width: 0.4 ms
Lead Channel Setting Sensing Sensitivity: 0.9 mV
Zone Setting Status: 755011
Zone Setting Status: 755011

## 2024-05-06 ENCOUNTER — Ambulatory Visit: Payer: Self-pay | Admitting: Internal Medicine

## 2024-05-12 DIAGNOSIS — Z79899 Other long term (current) drug therapy: Secondary | ICD-10-CM | POA: Diagnosis not present

## 2024-05-12 DIAGNOSIS — H26493 Other secondary cataract, bilateral: Secondary | ICD-10-CM | POA: Diagnosis not present

## 2024-05-12 DIAGNOSIS — H52203 Unspecified astigmatism, bilateral: Secondary | ICD-10-CM | POA: Diagnosis not present

## 2024-05-17 ENCOUNTER — Other Ambulatory Visit: Payer: Self-pay

## 2024-05-17 ENCOUNTER — Other Ambulatory Visit: Payer: Self-pay | Admitting: Emergency Medicine

## 2024-05-17 ENCOUNTER — Telehealth: Payer: Self-pay

## 2024-05-17 DIAGNOSIS — J849 Interstitial pulmonary disease, unspecified: Secondary | ICD-10-CM

## 2024-05-17 MED ORDER — BUDESONIDE-FORMOTEROL FUMARATE 160-4.5 MCG/ACT IN AERO: 2.0000 | INHALATION_SPRAY | Freq: Two times a day (BID) | RESPIRATORY_TRACT | 11 refills | Status: DC

## 2024-05-17 MED ORDER — BUDESONIDE-FORMOTEROL FUMARATE 160-4.5 MCG/ACT IN AERO: 2.0000 | INHALATION_SPRAY | Freq: Two times a day (BID) | RESPIRATORY_TRACT | 6 refills | Status: DC

## 2024-05-17 NOTE — Telephone Encounter (Signed)
 Copied from CRM [id number]. Topic: Clinical - Prescription Issue >> May 17, 2024  8:52 AM Corean SAUNDERS wrote: Reason for CRM: Patient is requesting Dr. Meade specifically to please order her a years worth of refills for budesonide -formoterol  (SYMBICORT ) 160-4.5 MCG/ACT inhaler to the following pharmacy:  [id number] [place], [place] - [address] AT [facility] OF [facility] [address] & [facility] [address] [place] [address] Phone: [phone] Fax: [phone]   Rx sent to pharmacy      [facility]

## 2024-05-20 DIAGNOSIS — Z79899 Other long term (current) drug therapy: Secondary | ICD-10-CM | POA: Diagnosis not present

## 2024-05-20 DIAGNOSIS — J439 Emphysema, unspecified: Secondary | ICD-10-CM | POA: Diagnosis not present

## 2024-05-20 DIAGNOSIS — Z0000 Encounter for general adult medical examination without abnormal findings: Secondary | ICD-10-CM | POA: Diagnosis not present

## 2024-05-20 DIAGNOSIS — J84112 Idiopathic pulmonary fibrosis: Secondary | ICD-10-CM | POA: Diagnosis not present

## 2024-05-20 DIAGNOSIS — I2510 Atherosclerotic heart disease of native coronary artery without angina pectoris: Secondary | ICD-10-CM | POA: Diagnosis not present

## 2024-05-20 DIAGNOSIS — R5383 Other fatigue: Secondary | ICD-10-CM | POA: Diagnosis not present

## 2024-05-20 DIAGNOSIS — E611 Iron deficiency: Secondary | ICD-10-CM | POA: Diagnosis not present

## 2024-05-20 DIAGNOSIS — R7303 Prediabetes: Secondary | ICD-10-CM | POA: Diagnosis not present

## 2024-05-20 DIAGNOSIS — I442 Atrioventricular block, complete: Secondary | ICD-10-CM | POA: Diagnosis not present

## 2024-05-20 DIAGNOSIS — I10 Essential (primary) hypertension: Secondary | ICD-10-CM | POA: Diagnosis not present

## 2024-05-20 DIAGNOSIS — Z950 Presence of cardiac pacemaker: Secondary | ICD-10-CM | POA: Diagnosis not present

## 2024-05-20 DIAGNOSIS — D84821 Immunodeficiency due to drugs: Secondary | ICD-10-CM | POA: Diagnosis not present

## 2024-05-20 DIAGNOSIS — E785 Hyperlipidemia, unspecified: Secondary | ICD-10-CM | POA: Diagnosis not present

## 2024-05-28 ENCOUNTER — Other Ambulatory Visit: Payer: Self-pay | Admitting: Internal Medicine

## 2024-06-03 ENCOUNTER — Other Ambulatory Visit (HOSPITAL_BASED_OUTPATIENT_CLINIC_OR_DEPARTMENT_OTHER): Payer: Self-pay | Admitting: Family Medicine

## 2024-06-03 DIAGNOSIS — Z7969 Long term (current) use of other immunomodulators and immunosuppressants: Secondary | ICD-10-CM

## 2024-06-09 DIAGNOSIS — H26491 Other secondary cataract, right eye: Secondary | ICD-10-CM | POA: Diagnosis not present

## 2024-06-09 NOTE — Progress Notes (Signed)
 Office Visit Note  Patient: [name]             Date of Birth: Mar 13, 1946           MRN: [id number]             PCP: Chrystal Lamarr RAMAN, MD Referring: Chrystal Lamarr RAMAN, * Visit Date: 06/23/2024 Occupation: @GUAROCC @  Subjective:  Joint stiffness  History of Present Illness: [name] is a 78 y.o. female with seropositive rheumatoid arthritis and ILD.  She returns today after her last visit in February 2025.  She denies any increased joint swelling or inflammation.  She continues to have some joint stiffness which is minimal.  She has been taking methotrexate  6 tablets p.o. weekly along with folic acid 1 mg daily and hydroxychloroquine  200 mg Monday to Friday without any interruption.  She denies any worsening of shortness of breath.  She has not had any problems with the recent triggering of her finger.    Activities of Daily Living:  Patient reports morning stiffness for less than 5 minutes.   Patient Denies nocturnal pain.  Difficulty dressing/grooming: Denies Difficulty climbing stairs: Denies Difficulty getting out of chair: Denies Difficulty using hands for taps, buttons, cutlery, and/or writing: Reports  Review of Systems  Constitutional:  Positive for fatigue.  HENT:  Positive for mouth dryness. Negative for mouth sores.   Eyes:  Positive for dryness.  Respiratory:  Positive for shortness of breath.   Cardiovascular:  Negative for chest pain and palpitations.  Gastrointestinal:  Negative for blood in stool, constipation and diarrhea.  Endocrine: Negative for increased urination.  Genitourinary:  Negative for involuntary urination.  Musculoskeletal:  Positive for joint pain, joint pain, myalgias, morning stiffness, muscle tenderness and myalgias. Negative for gait problem, joint swelling and muscle weakness.  Skin:  Negative for color change, rash, hair loss and sensitivity to sunlight.  Allergic/Immunologic: Positive for susceptible to infections.   Neurological:  Negative for dizziness and headaches.  Hematological:  Negative for swollen glands.  Psychiatric/Behavioral:  Positive for depressed mood. Negative for sleep disturbance. The patient is nervous/anxious.     PMFS History:  Patient Active Problem List   Diagnosis Date Noted   ILD (interstitial lung disease) (HCC) 01/21/2023   Acute respiratory failure with hypoxia (HCC) 02/24/2022   GERD (gastroesophageal reflux disease) 02/24/2022   Elevated troponin 02/24/2022   Bilateral carpal tunnel syndrome 04/14/2018   Cervical spinal stenosis 01/07/2018   Cervical radiculopathy 01/07/2018   Stenosis of left subclavian artery (HCC) 12/15/2017   History of gastroesophageal reflux (GERD) 06/25/2017   History of bilateral carpal tunnel release 06/25/2017   Former smoker 01/25/2017   Rheumatoid arthritis involving multiple sites with positive rheumatoid factor (HCC) 01/17/2017   High risk medication use 01/17/2017   Primary osteoarthritis of both hands 01/17/2017   Primary osteoarthritis of both feet 01/17/2017   DJD (degenerative joint disease), cervical 01/17/2017   Spondylosis of lumbar region without myelopathy or radiculopathy 01/17/2017   Cardiomyopathy, ischemic 07/07/2016   CAD- S/P LAD thrombectomy 07/04/16 07/07/2016   Pacemaker-MDT BiV placed 07/05/16 07/07/2016   History of rheumatoid arthritis 07/07/2016   Complete heart block (HCC) 07/04/2016   Essential hypertension 07/04/2016   LBBB (left bundle branch block) 07/04/2016   Exostosis 06/18/2011   History of breast cancer 02/16/2010   Vitamin D  deficiency 02/15/2010   HYPERCHOLESTEROLEMIA 02/15/2010   Anxiety state 02/15/2010   Asthma 02/15/2010   MIGRAINES, HX OF 02/15/2010   MASTECTOMY, LEFT, HX OF 02/15/2010  Past Medical History:  Diagnosis Date   Anxiety    Asthma    Breast cancer (HCC)    left breast   Carpal tunnel syndrome    COVID-19 06/2023   Depression    Habitual alcohol use    Heart attack  (HCC)    Hypertension    Interstitial lung disease (HCC)    Personal history of chemotherapy    Personal history of radiation therapy    RA (rheumatoid arthritis) (HCC)    Spinal stenosis    Vision abnormalities     Family History  Problem Relation Age of Onset   Stroke Mother    Cancer Father        colon?   Asthma Father    Pyelonephritis Sister    Pulmonary fibrosis Sister    Cancer Paternal Aunt    Cancer Paternal Uncle    Asthma Paternal Grandfather    Past Surgical History:  Procedure Laterality Date   BREAST BIOPSY     BREAST LUMPECTOMY  2005   left   CARDIAC CATHETERIZATION N/A 07/04/2016   Procedure: Left Heart Cath and Coronary Angiography;  Surgeon: Victory LELON Sharps, MD;  Location: [facility] INVASIVE CV LAB;  Service: Cardiovascular;  Laterality: N/A;   CARDIAC CATHETERIZATION N/A 07/04/2016   Procedure: Temporary Pacemaker;  Surgeon: Victory LELON Sharps, MD;  Location: [facility] INVASIVE CV LAB;  Service: Cardiovascular;  Laterality: N/A;   CARPAL TUNNEL RELEASE     CATARACT EXTRACTION Bilateral    EP IMPLANTABLE DEVICE N/A 07/05/2016   Procedure: Pacemaker Implant;  Surgeon: Danelle LELON Birmingham, MD;  Location: [facility] INVASIVE CV LAB;  Service: Cardiovascular;  Laterality: N/A;   herniated rectum repair   11/2020   LAMINECTOMY  1966   LYMPHECTOMY  2005   Social History   Social History Narrative   Not on file   Immunization History  Administered Date(s) Administered   Fluad Quad(high Dose 65+) 08/16/2019, 08/05/2022   Fluzone Influenza virus vaccine,trivalent (IIV3), split virus 08/05/2011, 09/15/2012, 08/12/2013   Influenza Split 09/10/2007, 09/05/2009   Influenza Whole 08/21/2009, 08/29/2010   Influenza, High Dose Seasonal PF 08/30/2021   Influenza,inj,Quad PF,6+ Mos 08/21/2016, 09/24/2017, 07/27/2018, 08/21/2020   Influenza,inj,quad, With Preservative 08/01/2014, 08/16/2015   PFIZER(Purple Top)SARS-COV-2 Vaccination 01/14/2020, 02/08/2020, 08/08/2020, 04/09/2021    Pfizer(Comirnaty)Fall Seasonal Vaccine 12 years and older 10/03/2023   Pneumococcal Conjugate-13 08/01/2014   Pneumococcal Polysaccharide-23 05/08/2009, 04/23/2022   Td 01/26/2018   Tdap 05/06/2007   Zoster, Live 06/16/2009     Objective: Vital Signs: BP (!) 154/64 (BP Location: Left Arm, Patient Position: Sitting, Cuff Size: Normal)   Pulse 71   Resp 13   Ht 5' 2 (1.575 m)   Wt 122 lb 6.4 oz (55.5 kg)   BMI 22.39 kg/m    Physical Exam Vitals and nursing note reviewed.  Constitutional:      Appearance: She is well-developed.  HENT:     Head: Normocephalic and atraumatic.  Eyes:     Conjunctiva/sclera: Conjunctivae normal.  Cardiovascular:     Rate and Rhythm: Normal rate and regular rhythm.     Heart sounds: Normal heart sounds.  Pulmonary:     Effort: Pulmonary effort is normal.     Breath sounds: Normal breath sounds.  Abdominal:     General: Bowel sounds are normal.     Palpations: Abdomen is soft.  Musculoskeletal:     Cervical back: Normal range of motion.  Lymphadenopathy:     Cervical: No cervical adenopathy.  Skin:  General: Skin is warm and dry.     Capillary Refill: Capillary refill takes less than 2 seconds.  Neurological:     Mental Status: She is alert and oriented to person, place, and time.  Psychiatric:        Behavior: Behavior normal.      Musculoskeletal Exam: She had limited lateral rotation of the cervical spine.  She had limited range of motion of her thoracic and lumbar spine without any discomfort.  Shoulder joints were in good range of motion.  She had right elbow joint contracture which was unchanged.  No synovitis was noted.  Wrist joints with good range of motion.  MCP PIP and DIP thickening with no synovitis was noted.  Hip joints and knee joints were in good range of motion.  There was no tenderness over ankles or MTPs.  CDAI Exam: CDAI Score: -- Patient Global: 50 / 100; Provider Global: 20 / 100 Swollen: --; Tender: -- Joint  Exam 06/23/2024   No joint exam has been documented for this visit   There is currently no information documented on the homunculus. Go to the Rheumatology activity and complete the homunculus joint exam.  Investigation: No additional findings.  Imaging: No results found.  Recent Labs: Lab Results  Component Value Date   WBC 7.0 04/15/2024   HGB 13.5 04/15/2024   PLT 367 04/15/2024   NA 139 04/15/2024   K 4.1 04/15/2024   CL 100 04/15/2024   CO2 23 04/15/2024   GLUCOSE 107 (H) 04/15/2024   BUN 16 04/15/2024   CREATININE 0.67 04/15/2024   BILITOT 0.5 04/15/2024   ALKPHOS 77 04/15/2024   AST 40 04/15/2024   ALT 22 04/15/2024   PROT 6.4 04/15/2024   ALBUMIN 3.8 04/15/2024   CALCIUM  9.2 04/15/2024   GFRAA 108 12/26/2020    Speciality Comments: PLQ Eye Exam: 05/12/2024 WNL @ [facility] f/u 12  months.   Procedures:  No procedures performed Allergies: Betadine [povidone iodine] and Hydrocodone   Assessment / Plan:     Visit Diagnoses: Rheumatoid arthritis involving multiple sites with positive rheumatoid factor (HCC)-patient denies having a flare of rheumatoid arthritis since the last visit.  No synovitis was noted on the examination.  She continues to have some stiffness.  She remains on methotrexate  and Plaquenil  combination without any interruption.  High risk medication use - Methotrexate  6 tablets by mouth once weekly, folic acid 1 mg by mouth daily, and hydroxychloroquine  200 mg  daily Monday to Friday.  Apr 15, 2024 CBC and CMP were normal.  Plaquenil  eye exam was normal on May 12, 2024.  ILD (interstitial lung disease) (HCC) - Probable early UIP related to underlying RA- HRCT 05/23/2022.Dr. Meade on 10/02/2023--no medication changes.  Chest CT and spirometry-stable.  Arthritis of right acromioclavicular joint-currently not symptomatic.  She good range of motion bilateral shoulders.  Contracture of right elbow-unchanged without any synovitis.  Medial  epicondylitis of right elbow - right medial epicondyle cortisone injection 01/15/24.  She had good response to the injection.  She had no tenderness on palpation today.  Primary osteoarthritis of both hands-she continues to have bilateral CMC PIP and DIP thickening with no synovitis.  Trigger finger, right middle finger-currently not triggering.  Primary osteoarthritis of right knee  Trochanteric bursitis, left hip-she gets intermittent discomfort in the left trochanteric region.  It was not tender today.  Primary osteoarthritis of both feet-no tenderness on the examination.  DDD (degenerative disc disease), cervical-she has some limitation with range  of motion without discomfort.  Degeneration of intervertebral disc of lumbar region without discogenic back pain or lower extremity pain-she had no point tenderness.  Other medical problems listed as follows:  History of gastroesophageal reflux (GERD)  History of hyperlipidemia  History of anxiety  History of breast cancer  History of ventricular tachycardia  Former smoker  History of asthma  History of hypertension  History of left bundle branch block  Vitamin D  deficiency  Vitamin B12 deficiency  Other fatigue  Orders: Orders Placed This Encounter  Procedures   CBC with Differential/Platelet   COMPLETE METABOLIC PANEL WITHOUT GFR   No orders of the defined types were placed in this encounter.    Follow-Up Instructions: Return in about 5 months (around 11/23/2024) for Rheumatoid arthritis.   Maya Nash, MD  Note - This record has been created using Animal nutritionist.  Chart creation errors have been sought, but may not always  have been located. Such creation errors do not reflect on  the standard of medical care.

## 2024-06-10 ENCOUNTER — Telehealth: Payer: Self-pay | Admitting: Internal Medicine

## 2024-06-10 NOTE — Telephone Encounter (Signed)
*  STAT* If patient is at the pharmacy, call can be transferred to refill team.   1. Which medications need to be refilled? (please list name of each medication and dose if known)  pantoprazole  (PROTONIX ) 40 MG tablet  2. Which pharmacy/location (including street and city if local pharmacy) is medication to be sent to? [id number] [place], [place] - [address] AT [facility] [address] & [facility] [address] Phone: [phone]  Fax: [phone]     3. Do they need a 30 day or 90 day supply? 90

## 2024-06-14 ENCOUNTER — Other Ambulatory Visit: Payer: Self-pay | Admitting: Internal Medicine

## 2024-06-16 NOTE — Telephone Encounter (Signed)
 Patient has a refill approved until the date.

## 2024-06-17 ENCOUNTER — Other Ambulatory Visit: Payer: Self-pay

## 2024-06-17 MED ORDER — PANTOPRAZOLE SODIUM 40 MG PO TBEC: DELAYED_RELEASE_TABLET | ORAL | 0 refills | Status: DC

## 2024-06-23 ENCOUNTER — Encounter: Payer: Self-pay | Admitting: Rheumatology

## 2024-06-23 ENCOUNTER — Ambulatory Visit: Payer: Medicare Other | Attending: Rheumatology | Admitting: Rheumatology

## 2024-06-23 VITALS — BP 154/64 | HR 71 | Resp 13 | Ht 62.0 in | Wt 122.4 lb

## 2024-06-23 DIAGNOSIS — M7701 Medial epicondylitis, right elbow: Secondary | ICD-10-CM | POA: Diagnosis not present

## 2024-06-23 DIAGNOSIS — M5030 Other cervical disc degeneration, unspecified cervical region: Secondary | ICD-10-CM

## 2024-06-23 DIAGNOSIS — Z8719 Personal history of other diseases of the digestive system: Secondary | ICD-10-CM

## 2024-06-23 DIAGNOSIS — Z79899 Other long term (current) drug therapy: Secondary | ICD-10-CM

## 2024-06-23 DIAGNOSIS — M7062 Trochanteric bursitis, left hip: Secondary | ICD-10-CM | POA: Diagnosis not present

## 2024-06-23 DIAGNOSIS — Z8659 Personal history of other mental and behavioral disorders: Secondary | ICD-10-CM

## 2024-06-23 DIAGNOSIS — M51369 Other intervertebral disc degeneration, lumbar region without mention of lumbar back pain or lower extremity pain: Secondary | ICD-10-CM

## 2024-06-23 DIAGNOSIS — Z8679 Personal history of other diseases of the circulatory system: Secondary | ICD-10-CM

## 2024-06-23 DIAGNOSIS — G8929 Other chronic pain: Secondary | ICD-10-CM

## 2024-06-23 DIAGNOSIS — M19041 Primary osteoarthritis, right hand: Secondary | ICD-10-CM

## 2024-06-23 DIAGNOSIS — E559 Vitamin D deficiency, unspecified: Secondary | ICD-10-CM

## 2024-06-23 DIAGNOSIS — E538 Deficiency of other specified B group vitamins: Secondary | ICD-10-CM

## 2024-06-23 DIAGNOSIS — J849 Interstitial pulmonary disease, unspecified: Secondary | ICD-10-CM

## 2024-06-23 DIAGNOSIS — Z87891 Personal history of nicotine dependence: Secondary | ICD-10-CM

## 2024-06-23 DIAGNOSIS — M1711 Unilateral primary osteoarthritis, right knee: Secondary | ICD-10-CM

## 2024-06-23 DIAGNOSIS — R7989 Other specified abnormal findings of blood chemistry: Secondary | ICD-10-CM

## 2024-06-23 DIAGNOSIS — M19071 Primary osteoarthritis, right ankle and foot: Secondary | ICD-10-CM

## 2024-06-23 DIAGNOSIS — M24521 Contracture, right elbow: Secondary | ICD-10-CM

## 2024-06-23 DIAGNOSIS — M0579 Rheumatoid arthritis with rheumatoid factor of multiple sites without organ or systems involvement: Secondary | ICD-10-CM

## 2024-06-23 DIAGNOSIS — M65331 Trigger finger, right middle finger: Secondary | ICD-10-CM

## 2024-06-23 DIAGNOSIS — M19042 Primary osteoarthritis, left hand: Secondary | ICD-10-CM

## 2024-06-23 DIAGNOSIS — Z853 Personal history of malignant neoplasm of breast: Secondary | ICD-10-CM

## 2024-06-23 DIAGNOSIS — M19011 Primary osteoarthritis, right shoulder: Secondary | ICD-10-CM

## 2024-06-23 DIAGNOSIS — Z8709 Personal history of other diseases of the respiratory system: Secondary | ICD-10-CM

## 2024-06-23 DIAGNOSIS — R5383 Other fatigue: Secondary | ICD-10-CM

## 2024-06-23 DIAGNOSIS — Z8639 Personal history of other endocrine, nutritional and metabolic disease: Secondary | ICD-10-CM

## 2024-06-23 DIAGNOSIS — M19072 Primary osteoarthritis, left ankle and foot: Secondary | ICD-10-CM

## 2024-06-23 LAB — COMPLETE METABOLIC PANEL WITHOUT GFR
AG Ratio: 1.6 (calc) (ref 1.0–2.5)
ALT: 20 U/L (ref 6–29)
AST: 33 U/L (ref 10–35)
Albumin: 3.9 g/dL (ref 3.6–5.1)
Alkaline phosphatase (APISO): 65 U/L (ref 37–153)
BUN/Creatinine Ratio: 30 (calc) — ABNORMAL HIGH (ref 6–22)
BUN: 15 mg/dL (ref 7–25)
CO2: 32 mmol/L (ref 20–32)
Calcium: 9.3 mg/dL (ref 8.6–10.4)
Chloride: 100 mmol/L (ref 98–110)
Creat: 0.5 mg/dL — ABNORMAL LOW (ref 0.60–0.95)
Globulin: 2.5 g/dL (ref 1.9–3.7)
Glucose, Bld: 96 mg/dL (ref 65–99)
Potassium: 4.8 mmol/L (ref 3.5–5.3)
Sodium: 138 mmol/L (ref 135–146)
Total Bilirubin: 0.6 mg/dL (ref 0.2–1.2)
Total Protein: 6.4 g/dL (ref 6.1–8.1)

## 2024-06-23 LAB — CBC WITH DIFFERENTIAL/PLATELET
Absolute Lymphocytes: 1470 {cells}/uL (ref 850–3900)
Absolute Monocytes: 1014 {cells}/uL — ABNORMAL HIGH (ref 200–950)
Basophils Absolute: 83 {cells}/uL (ref 0–200)
Basophils Relative: 1.2 %
Eosinophils Absolute: 373 {cells}/uL (ref 15–500)
Eosinophils Relative: 5.4 %
HCT: 40.2 % (ref 35.0–45.0)
Hemoglobin: 13.3 g/dL (ref 11.7–15.5)
MCH: 30.9 pg (ref 27.0–33.0)
MCHC: 33.1 g/dL (ref 32.0–36.0)
MCV: 93.5 fL (ref 80.0–100.0)
MPV: 9.2 fL (ref 7.5–12.5)
Monocytes Relative: 14.7 %
Neutro Abs: 3961 {cells}/uL (ref 1500–7800)
Neutrophils Relative %: 57.4 %
Platelets: 300 Thousand/uL (ref 140–400)
RBC: 4.3 Million/uL (ref 3.80–5.10)
RDW: 13.8 % (ref 11.0–15.0)
Total Lymphocyte: 21.3 %
WBC: 6.9 Thousand/uL (ref 3.8–10.8)

## 2024-06-23 NOTE — Patient Instructions (Signed)
 Standing Labs We placed an order today for your standing lab work.   Please have your standing labs drawn in November and every 3 months  Please have your labs drawn 2 weeks prior to your appointment so that the provider can discuss your lab results at your appointment, if possible.  Please note that you may see your imaging and lab results in MyChart before we have reviewed them. We will contact you once all results are reviewed. Please allow our office up to 72 hours to thoroughly review all of the results before contacting the office for clarification of your results.  WALK-IN LAB HOURS  Monday through Thursday from 8:00 am -12:30 pm and 1:00 pm-4:30 pm and Friday from 8:00 am-12:00 pm.  Patients with office visits requiring labs will be seen before walk-in labs.  You may encounter longer than normal wait times. Please allow additional time. Wait times may be shorter on  Monday and Thursday afternoons.  We do not book appointments for walk-in labs. We appreciate your patience and understanding with our staff.   Labs are drawn by [facility]. Please bring your co-pay at the time of your lab draw.  You may receive a bill from [facility] for your lab work.  Please note if you are on Hydroxychloroquine and and an order has been placed for a Hydroxychloroquine level,  you will need to have it drawn 4 hours or more after your last dose.  If you wish to have your labs drawn at another location, please call the office 24 hours in advance so we can fax the orders.  The office is located at [address], [address], [place], [place] [address]   If you have any questions regarding directions or hours of operation,  please call [phone].   As a reminder, please drink plenty of water prior to coming for your lab work. Thanks!   Vaccines You are taking a medication(s) that can suppress your immune system.  The following immunizations are recommended: Flu annually Covid-19  Td/Tdap (tetanus,  diphtheria, pertussis) every 10 years Pneumonia (Prevnar 15 then Pneumovax 23  at least 1 year apart.  Alternatively, can take Prevnar 20 without needing additional dose) Shingrix: 2 doses from 4 weeks to 6 months apart  Please check with your PCP to make sure you are up to date.   If you have signs or symptoms of an infection or start antibiotics: First, call your PCP for workup of your infection. Hold your medication through the infection, until you complete your antibiotics, and until symptoms resolve if you take the following: Injectable medication (Actemra, Benlysta, Cimzia, Cosentyx, Enbrel , Humira, Kevzara, Orencia, Remicade, Simponi, Stelara, Taltz, Tremfya) Methotrexate Leflunomide (Arava) Mycophenolate (Cellcept) Earma, Olumiant, or Rinvoq

## 2024-06-24 ENCOUNTER — Ambulatory Visit: Payer: Self-pay | Admitting: Rheumatology

## 2024-06-24 NOTE — Progress Notes (Signed)
 CBC and CMP normal

## 2024-07-08 DIAGNOSIS — L728 Other follicular cysts of the skin and subcutaneous tissue: Secondary | ICD-10-CM | POA: Diagnosis not present

## 2024-07-08 DIAGNOSIS — L814 Other melanin hyperpigmentation: Secondary | ICD-10-CM | POA: Diagnosis not present

## 2024-07-08 DIAGNOSIS — L821 Other seborrheic keratosis: Secondary | ICD-10-CM | POA: Diagnosis not present

## 2024-07-08 DIAGNOSIS — D485 Neoplasm of uncertain behavior of skin: Secondary | ICD-10-CM | POA: Diagnosis not present

## 2024-07-08 DIAGNOSIS — D225 Melanocytic nevi of trunk: Secondary | ICD-10-CM | POA: Diagnosis not present

## 2024-07-09 ENCOUNTER — Other Ambulatory Visit: Payer: Self-pay | Admitting: Internal Medicine

## 2024-07-14 ENCOUNTER — Other Ambulatory Visit: Payer: Self-pay | Admitting: Physician Assistant

## 2024-07-14 ENCOUNTER — Other Ambulatory Visit: Payer: Self-pay | Admitting: Rheumatology

## 2024-07-14 DIAGNOSIS — M0579 Rheumatoid arthritis with rheumatoid factor of multiple sites without organ or systems involvement: Secondary | ICD-10-CM

## 2024-07-14 NOTE — Telephone Encounter (Signed)
 Last Fill: 04/19/2024  Eye exam: 05/12/2024 WNL   Labs: 06/23/2024 CBC and CMP normal.   Next Visit: 12/10/2023  Last Visit: 06/23/2024  DX: Rheumatoid arthritis involving multiple sites with positive rheumatoid factor   Current Dose per office note 06/23/2024: hydroxychloroquine  200 mg daily Monday to Friday   Okay to refill Plaquenil ?

## 2024-07-16 NOTE — Progress Notes (Signed)
 Remote pacemaker transmission.

## 2024-07-16 NOTE — Addendum Note (Signed)
 Addended by: VICCI SELLER A on: 07/16/2024 10:51 AM   Modules accepted: Orders

## 2024-08-03 ENCOUNTER — Ambulatory Visit (INDEPENDENT_AMBULATORY_CARE_PROVIDER_SITE_OTHER): Payer: Medicare Other

## 2024-08-03 DIAGNOSIS — I442 Atrioventricular block, complete: Secondary | ICD-10-CM | POA: Diagnosis not present

## 2024-08-05 LAB — CUP PACEART REMOTE DEVICE CHECK
Battery Remaining Longevity: 19 mo
Battery Voltage: 2.9 V
Brady Statistic AP VP Percent: 2.77 %
Brady Statistic AP VS Percent: 0.02 %
Brady Statistic AS VP Percent: 96.42 %
Brady Statistic AS VS Percent: 0.79 %
Brady Statistic RA Percent Paced: 2.78 %
Brady Statistic RV Percent Paced: 97.44 %
Date Time Interrogation Session: 20250917230855
Implantable Lead Connection Status: 753985
Implantable Lead Connection Status: 753985
Implantable Lead Connection Status: 753985
Implantable Lead Implant Date: 20170818
Implantable Lead Implant Date: 20170818
Implantable Lead Implant Date: 20170818
Implantable Lead Location: 753858
Implantable Lead Location: 753859
Implantable Lead Location: 753860
Implantable Lead Model: 4396
Implantable Lead Model: 5076
Implantable Lead Model: 5076
Implantable Pulse Generator Implant Date: 20170818
Lead Channel Impedance Value: 342 Ohm
Lead Channel Impedance Value: 380 Ohm
Lead Channel Impedance Value: 418 Ohm
Lead Channel Impedance Value: 418 Ohm
Lead Channel Impedance Value: 494 Ohm
Lead Channel Impedance Value: 494 Ohm
Lead Channel Impedance Value: 532 Ohm
Lead Channel Impedance Value: 608 Ohm
Lead Channel Impedance Value: 798 Ohm
Lead Channel Pacing Threshold Amplitude: 0.375 V
Lead Channel Pacing Threshold Amplitude: 0.625 V
Lead Channel Pacing Threshold Amplitude: 0.625 V
Lead Channel Pacing Threshold Pulse Width: 0.4 ms
Lead Channel Pacing Threshold Pulse Width: 0.4 ms
Lead Channel Pacing Threshold Pulse Width: 0.4 ms
Lead Channel Sensing Intrinsic Amplitude: 1.625 mV
Lead Channel Sensing Intrinsic Amplitude: 1.625 mV
Lead Channel Sensing Intrinsic Amplitude: 3 mV
Lead Channel Sensing Intrinsic Amplitude: 3 mV
Lead Channel Setting Pacing Amplitude: 1.5 V
Lead Channel Setting Pacing Amplitude: 1.75 V
Lead Channel Setting Pacing Amplitude: 2 V
Lead Channel Setting Pacing Pulse Width: 0.4 ms
Lead Channel Setting Pacing Pulse Width: 0.4 ms
Lead Channel Setting Sensing Sensitivity: 0.9 mV
Zone Setting Status: 755011
Zone Setting Status: 755011

## 2024-08-09 NOTE — Progress Notes (Signed)
 Remote PPM Transmission

## 2024-08-14 ENCOUNTER — Ambulatory Visit: Payer: Self-pay | Admitting: Internal Medicine

## 2024-08-30 ENCOUNTER — Other Ambulatory Visit: Payer: Self-pay

## 2024-08-30 DIAGNOSIS — M0579 Rheumatoid arthritis with rheumatoid factor of multiple sites without organ or systems involvement: Secondary | ICD-10-CM

## 2024-08-30 DIAGNOSIS — J849 Interstitial pulmonary disease, unspecified: Secondary | ICD-10-CM

## 2024-08-30 DIAGNOSIS — R7989 Other specified abnormal findings of blood chemistry: Secondary | ICD-10-CM

## 2024-08-30 DIAGNOSIS — Z79899 Other long term (current) drug therapy: Secondary | ICD-10-CM

## 2024-08-30 MED ORDER — METHOTREXATE SODIUM 2.5 MG PO TABS: ORAL_TABLET | ORAL | 0 refills | Status: DC

## 2024-08-30 NOTE — Telephone Encounter (Signed)
 Refill request received via fax from [facility] for Methotrexate   Last Fill: 03/15/2024  Labs: 06/23/2024 CBC and CMP normal.   Next Visit: 12/09/2024  Last Visit: 06/23/2024  DX: Rheumatoid arthritis involving multiple sites with positive rheumatoid factor   Current Dose per office note 06/23/2024: Methotrexate  6 tablets by mouth once weekly   Okay to refill Methotrexate ?

## 2024-09-09 ENCOUNTER — Ambulatory Visit: Attending: Internal Medicine | Admitting: Internal Medicine

## 2024-09-09 ENCOUNTER — Encounter: Payer: Self-pay | Admitting: Internal Medicine

## 2024-09-09 VITALS — BP 120/60 | HR 72 | Ht 62.0 in | Wt 123.6 lb

## 2024-09-09 DIAGNOSIS — I10 Essential (primary) hypertension: Secondary | ICD-10-CM | POA: Diagnosis not present

## 2024-09-09 DIAGNOSIS — I447 Left bundle-branch block, unspecified: Secondary | ICD-10-CM

## 2024-09-09 DIAGNOSIS — Z950 Presence of cardiac pacemaker: Secondary | ICD-10-CM | POA: Diagnosis not present

## 2024-09-09 DIAGNOSIS — Z9861 Coronary angioplasty status: Secondary | ICD-10-CM | POA: Diagnosis not present

## 2024-09-09 DIAGNOSIS — Z79899 Other long term (current) drug therapy: Secondary | ICD-10-CM | POA: Diagnosis not present

## 2024-09-09 DIAGNOSIS — I442 Atrioventricular block, complete: Secondary | ICD-10-CM | POA: Diagnosis not present

## 2024-09-09 DIAGNOSIS — I2510 Atherosclerotic heart disease of native coronary artery without angina pectoris: Secondary | ICD-10-CM | POA: Diagnosis not present

## 2024-09-09 DIAGNOSIS — I255 Ischemic cardiomyopathy: Secondary | ICD-10-CM | POA: Diagnosis not present

## 2024-09-09 MED ORDER — PANTOPRAZOLE SODIUM 40 MG PO TBEC: DELAYED_RELEASE_TABLET | ORAL | 3 refills | Status: DC

## 2024-09-09 NOTE — Patient Instructions (Signed)
 Medication Instructions:  Your physician recommends that you continue on your current medications as directed. Please refer to the Current Medication list given to you today.  *If you need a refill on your cardiac medications before your next appointment, please call your pharmacy*  Follow-Up: At [facility], you and your health needs are our priority.  As part of our continuing mission to provide you with exceptional heart care, our providers are all part of one team.  This team includes your primary Cardiologist (physician) and Advanced Practice Providers or APPs (Physician Assistants and Nurse Practitioners) who all work together to provide you with the care you need, when you need it.  Your next appointment:   1 year(s) with Dr. Okey January 2026 with Dr. Almetta (EP)  Provider:   Vina Okey, MD   We recommend signing up for the patient portal called MyChart.  Sign up information is provided on this After Visit Summary.  MyChart is used to connect with patients for Virtual Visits (Telemedicine).  Patients are able to view lab/test results, encounter notes, upcoming appointments, etc.  Non-urgent messages can be sent to your provider as well.    To learn more about what you can do with MyChart, go to [email or web address].

## 2024-09-09 NOTE — Progress Notes (Signed)
 Cardiology Office Note:    Date:  09/09/2024   ID:  [name], DOB 1946-10-08, MRN [id number]  PCP:  Brandy Lamarr RAMAN, MD  Cardiologist:  None   Referring MD: [name], *   Follow up for HTN, CAD, CHF   History of Present Illness:    [name] is a 78 y.o. female with a hx of  CAD (s/p thrombotic occlusion of LAD; underwent thrombectomy in 2017), HFrEF (EF 35 to 40% in 2018)  and CHB  s/p BIV pacer, breast Ca, RA, varicose veins,  depression.    Echo in 2018 LVEF 55%  Repeat echo  in 2023 greater than 55%   Previously followed by [name]   ALso sees [name] .  I saw the pt in Aug 2024  She was seen by [name] in the interval    The pt says she has been having more SOB over the past several weeks  SOme wheezing  Denies CP   No LE edema  No palpitations   Diet Br:  Coffee  Toast   Fruit Lunch   [place]iwch  Cucumber  Fruit Cooki   Sweet Dinner   Varies   Whatever cook  Past Medical History:  Diagnosis Date   Anxiety    Asthma    Breast cancer (HCC)    left breast   Carpal tunnel syndrome    COVID-19 06/2023   Depression    Habitual alcohol use    Heart attack (HCC)    Hypertension    Interstitial lung disease (HCC)    Personal history of chemotherapy    Personal history of radiation therapy    RA (rheumatoid arthritis) (HCC)    Spinal stenosis    Vision abnormalities     Past Surgical History:  Procedure Laterality Date   BREAST BIOPSY     BREAST LUMPECTOMY  2005   left   CARDIAC CATHETERIZATION N/A 07/04/2016   Procedure: Left Heart Cath and Coronary Angiography;  Surgeon: Brandy LELON Sharps, MD;  Location: [facility] INVASIVE CV LAB;  Service: Cardiovascular;  Laterality: N/A;   CARDIAC CATHETERIZATION N/A 07/04/2016   Procedure: Temporary Pacemaker;  Surgeon: Brandy LELON Sharps, MD;  Location: [facility] INVASIVE CV LAB;  Service: Cardiovascular;  Laterality: N/A;   CARPAL TUNNEL RELEASE     CATARACT EXTRACTION Bilateral    EP IMPLANTABLE DEVICE N/A  07/05/2016   Procedure: Pacemaker Implant;  Surgeon: Brandy LELON Birmingham, MD;  Location: [facility] INVASIVE CV LAB;  Service: Cardiovascular;  Laterality: N/A;   herniated rectum repair   11/2020   LAMINECTOMY  1966   LYMPHECTOMY  2005    Current Medications: No outpatient medications have been marked as taking for the 09/09/24 encounter (Appointment) with Brandy Vina GAILS, MD.     Allergies:   Betadine [povidone iodine] and Hydrocodone   Social History   Socioeconomic History   Marital status: Widowed    Spouse name: Not on file   Number of children: Not on file   Years of education: Not on file   Highest education level: Not on file  Occupational History   Not on file  Tobacco Use   Smoking status: Former    Current packs/day: 0.00    Average packs/day: 2.0 packs/day for 25.0 years (50.0 ttl pk-yrs)    Types: Cigarettes    Start date: 2    Quit date: 63    Years since quitting: 30.8    Passive exposure: Past   Smokeless  tobacco: Never  Vaping Use   Vaping status: Never Used  Substance and Sexual Activity   Alcohol use: Yes    Alcohol/week: 14.0 standard drinks of alcohol    Types: 14 Cans of beer per week    Comment: 2 DAILY   Drug use: Never   Sexual activity: Not on file  Other Topics Concern   Not on file  Social History Narrative   Not on file   Social Drivers of Health   Financial Resource Strain: Not on file  Food Insecurity: Not on file  Transportation Needs: Not on file  Physical Activity: Not on file  Stress: Not on file  Social Connections: Not on file     Family History: The patient's family history includes Asthma in her father and paternal grandfather; Cancer in her father, paternal aunt, and paternal uncle; Pulmonary fibrosis in her sister; Pyelonephritis in her sister; Stroke in her mother.  ROS:   Please see the history of present illness.    She has no current complaints.  She is in [place] for the summer.  She is having no exertional  intolerance.  She is able to sleep laying flat all other systems reviewed and are negative.  EKGs/Labs/Other Studies Reviewed:    The following studies were reviewed today:  2 D Doppler ECHOCARDIOGRAM 02/24/2022: IMPRESSIONS   1. Left ventricular ejection fraction, by estimation, is 55 to 60%. The  left ventricle has normal function. The left ventricle has no regional  wall motion abnormalities. Left ventricular diastolic function could not  be evaluated.   2. Right ventricular systolic function is normal. The right ventricular  size is normal. There is normal pulmonary artery systolic pressure. The  estimated right ventricular systolic pressure is 32.6 mmHg.   3. Left atrial size was mild to moderately dilated.   4. The mitral valve is degenerative. Mild mitral valve regurgitation. No  evidence of mitral stenosis. Moderate to severe mitral annular  calcification.   5. The aortic valve is tricuspid. Aortic valve regurgitation is not  visualized. No aortic stenosis is present.   6. The inferior vena cava is dilated in size with >50% respiratory  variability, suggesting right atrial pressure of 8 mmHg.   EKG:  EKG shows demonstrated atrial sensed with ventricular pacing. 72 bpm  Recent Labs: 06/23/2024: ALT 20; BUN 15; Creat 0.50; Hemoglobin 13.3; Platelets 300; Potassium 4.8; Sodium 138  Recent Lipid Panel    Component Value Date/Time   CHOL 166 07/05/2016 0313   TRIG 61 02/23/2022 1647   HDL 54 07/05/2016 0313   CHOLHDL 3.1 07/05/2016 0313   VLDL 18 07/05/2016 0313   LDLCALC 94 07/05/2016 0313    Physical Exam:    VS:  There were no vitals taken for this visit.    Wt Readings from Last 3 Encounters:  06/23/24 122 lb 6.4 oz (55.5 kg)  04/21/24 123 lb (55.8 kg)  02/11/24 122 lb (55.3 kg)     GEN: Thin 78 yo in NAD  HEENT: Normal NECK: No JVD CARDIAC:  RRR  No murmurs   RESPIRATORY:  Decreased airflow  Rhonchi ABDOMEN: Soft  Nontender   No hepatomegaly Ext  No LE edema   1-2+ pulses  ASSESSMENT:    1 Dyspnea   I think the pt's dyspnea is from her lung dz   Airlfow down     Volume status is good    Keep on current regimen Will work to get appt in Pulmonary   Dr  Meade is leaving   2 CAD  s/p thrombectomy in 2017     LVEF April 2023 55 to 60% I am not convinced of any angina   3  CHB   Pt s/p BiV pacer    Follow in EP   Will need follow up scheduled  4  HTN   BP is controlled today     5  HL   Last lipids in July 2025 LDL 42  HDL 74  Trig 56  6  MEtabolics  A1C 6.1  Reviewed diet  CUt out carbs and sweet tea  More protein    6  RA   Follows in rheum for RA  Not active      Medication Adjustments/Labs and Tests Ordered: Current medicines are reviewed at length with the patient today.  Concerns regarding medicines are outlined above.    Signed, Vina Gull, MD  09/09/2024 6:35 AM    [facility]

## 2024-09-29 ENCOUNTER — Other Ambulatory Visit: Payer: Self-pay | Admitting: Rheumatology

## 2024-09-29 DIAGNOSIS — M0579 Rheumatoid arthritis with rheumatoid factor of multiple sites without organ or systems involvement: Secondary | ICD-10-CM

## 2024-09-29 NOTE — Telephone Encounter (Signed)
 Last Fill: 07/14/2024  Eye exam:  05/12/2024 WNL   Labs: 06/23/2024 CBC and CMP normal.   Next Visit: 12/09/2024  Last Visit: 06/23/2024  IK:Myzlfjunpi arthritis involving multiple sites with positive rheumatoid factor   Current Dose per office note 06/23/2024: hydroxychloroquine  200 mg daily Monday to Friday   Okay to refill Plaquenil ?

## 2024-10-05 ENCOUNTER — Other Ambulatory Visit: Payer: Self-pay | Admitting: *Deleted

## 2024-10-05 DIAGNOSIS — Z79899 Other long term (current) drug therapy: Secondary | ICD-10-CM

## 2024-10-12 ENCOUNTER — Ambulatory Visit: Payer: Self-pay | Admitting: Physician Assistant

## 2024-10-12 DIAGNOSIS — Z79899 Other long term (current) drug therapy: Secondary | ICD-10-CM

## 2024-10-12 DIAGNOSIS — R7989 Other specified abnormal findings of blood chemistry: Secondary | ICD-10-CM

## 2024-10-12 LAB — CMP14+EGFR
ALT: 24 IU/L (ref 0–32)
AST: 42 IU/L — ABNORMAL HIGH (ref 0–40)
Albumin: 3.8 g/dL (ref 3.8–4.8)
Alkaline Phosphatase: 78 IU/L (ref 49–135)
BUN/Creatinine Ratio: 22 (ref 12–28)
BUN: 14 mg/dL (ref 8–27)
Bilirubin Total: 0.5 mg/dL (ref 0.0–1.2)
CO2: 25 mmol/L (ref 20–29)
Calcium: 9.3 mg/dL (ref 8.7–10.3)
Chloride: 99 mmol/L (ref 96–106)
Creatinine, Ser: 0.64 mg/dL (ref 0.57–1.00)
Globulin, Total: 2.4 g/dL (ref 1.5–4.5)
Glucose: 79 mg/dL (ref 70–99)
Potassium: 4.1 mmol/L (ref 3.5–5.2)
Sodium: 136 mmol/L (ref 134–144)
Total Protein: 6.2 g/dL (ref 6.0–8.5)
eGFR: 89 mL/min/1.73 (ref >59)

## 2024-10-12 LAB — CBC WITH DIFFERENTIAL/PLATELET
Basophils Absolute: 0.1 x10E3/uL (ref 0.0–0.2)
Basos: 1 %
EOS (ABSOLUTE): 0.5 x10E3/uL — ABNORMAL HIGH (ref 0.0–0.4)
Eos: 6 %
Hematocrit: 40.9 % (ref 34.0–46.6)
Hemoglobin: 13.4 g/dL (ref 11.1–15.9)
Immature Grans (Abs): 0 x10E3/uL (ref 0.0–0.1)
Immature Granulocytes: 0 %
Lymphocytes Absolute: 1.4 x10E3/uL (ref 0.7–3.1)
Lymphs: 17 %
MCH: 30.5 pg (ref 26.6–33.0)
MCHC: 32.8 g/dL (ref 31.5–35.7)
MCV: 93 fL (ref 79–97)
Monocytes Absolute: 1 x10E3/uL — ABNORMAL HIGH (ref 0.1–0.9)
Monocytes: 13 %
Neutrophils Absolute: 4.9 x10E3/uL (ref 1.4–7.0)
Neutrophils: 63 %
Platelets: 319 x10E3/uL (ref 150–450)
RBC: 4.4 x10E6/uL (ref 3.77–5.28)
RDW: 14.2 % (ref 11.7–15.4)
WBC: 7.9 x10E3/uL (ref 3.4–10.8)

## 2024-10-12 NOTE — Progress Notes (Signed)
 Alcohol use is not recommended in patients taking methotrexate .  Will she be able to cut back on alcohol use?  If not we may need to discuss treatment alternatives.  Recommend reducing the dose of methotrexate  to 5 tablets weekly and to recheck hepatic function panel in 2 to 3 weeks.  Patient should try to avoid the use of alcohol and Tylenol 

## 2024-10-12 NOTE — Progress Notes (Signed)
 AST is borderline elevated. ALT WNL. Please clarify if she has been taking any tylenol ? Alcohol use?  Any other medication changes?  Absolute monocytes and eosinophils are borderline elevated, rest of CBC WNL. We will continue to monitor.

## 2024-10-20 NOTE — Progress Notes (Signed)
 She will likely need to discuss treatment alternatives with Dr. Dolphus at the next follow-up visit

## 2024-11-02 ENCOUNTER — Ambulatory Visit: Payer: Medicare Other

## 2024-11-02 DIAGNOSIS — I442 Atrioventricular block, complete: Secondary | ICD-10-CM

## 2024-11-04 ENCOUNTER — Other Ambulatory Visit: Payer: Self-pay

## 2024-11-04 DIAGNOSIS — R7989 Other specified abnormal findings of blood chemistry: Secondary | ICD-10-CM

## 2024-11-04 DIAGNOSIS — Z79899 Other long term (current) drug therapy: Secondary | ICD-10-CM

## 2024-11-05 LAB — CUP PACEART REMOTE DEVICE CHECK
Battery Remaining Longevity: 15 mo
Battery Voltage: 2.89 V
Brady Statistic AP VP Percent: 2.35 %
Brady Statistic AP VS Percent: 0.02 %
Brady Statistic AS VP Percent: 97.29 %
Brady Statistic AS VS Percent: 0.34 %
Brady Statistic RA Percent Paced: 2.37 %
Brady Statistic RV Percent Paced: 98.6 %
Date Time Interrogation Session: 20251218223338
Implantable Lead Connection Status: 753985
Implantable Lead Connection Status: 753985
Implantable Lead Connection Status: 753985
Implantable Lead Implant Date: 20170818
Implantable Lead Implant Date: 20170818
Implantable Lead Implant Date: 20170818
Implantable Lead Location: 753858
Implantable Lead Location: 753859
Implantable Lead Location: 753860
Implantable Lead Model: 4396
Implantable Lead Model: 5076
Implantable Lead Model: 5076
Implantable Pulse Generator Implant Date: 20170818
Lead Channel Impedance Value: 323 Ohm
Lead Channel Impedance Value: 380 Ohm
Lead Channel Impedance Value: 380 Ohm
Lead Channel Impedance Value: 399 Ohm
Lead Channel Impedance Value: 437 Ohm
Lead Channel Impedance Value: 475 Ohm
Lead Channel Impedance Value: 494 Ohm
Lead Channel Impedance Value: 551 Ohm
Lead Channel Impedance Value: 703 Ohm
Lead Channel Pacing Threshold Amplitude: 0.375 V
Lead Channel Pacing Threshold Amplitude: 0.625 V
Lead Channel Pacing Threshold Amplitude: 0.875 V
Lead Channel Pacing Threshold Pulse Width: 0.4 ms
Lead Channel Pacing Threshold Pulse Width: 0.4 ms
Lead Channel Pacing Threshold Pulse Width: 0.4 ms
Lead Channel Sensing Intrinsic Amplitude: 0.75 mV
Lead Channel Sensing Intrinsic Amplitude: 0.75 mV
Lead Channel Sensing Intrinsic Amplitude: 6.125 mV
Lead Channel Sensing Intrinsic Amplitude: 6.125 mV
Lead Channel Setting Pacing Amplitude: 1.5 V
Lead Channel Setting Pacing Amplitude: 2 V
Lead Channel Setting Pacing Amplitude: 2 V
Lead Channel Setting Pacing Pulse Width: 0.4 ms
Lead Channel Setting Pacing Pulse Width: 0.4 ms
Lead Channel Setting Sensing Sensitivity: 0.9 mV
Zone Setting Status: 755011
Zone Setting Status: 755011

## 2024-11-05 NOTE — Progress Notes (Signed)
 Remote PPM Transmission

## 2024-11-09 ENCOUNTER — Ambulatory Visit: Payer: Self-pay | Admitting: Physician Assistant

## 2024-11-09 LAB — HEPATIC FUNCTION PANEL
ALT: 22 IU/L (ref 0–32)
AST: 37 IU/L (ref 0–40)
Albumin: 3.7 g/dL — ABNORMAL LOW (ref 3.8–4.8)
Alkaline Phosphatase: 75 IU/L (ref 49–135)
Bilirubin Total: 0.4 mg/dL (ref 0.0–1.2)
Bilirubin, Direct: 0.16 mg/dL (ref 0.00–0.40)
Total Protein: 6.1 g/dL (ref 6.0–8.5)

## 2024-11-09 NOTE — Progress Notes (Signed)
 AST and ALT WNL  Albumin is borderline low. Rest of hepatic function panel WNL.

## 2024-11-11 ENCOUNTER — Other Ambulatory Visit: Payer: Self-pay | Admitting: Physician Assistant

## 2024-11-12 NOTE — Telephone Encounter (Signed)
 Last Fill: 10/20/2023  Next Visit: 12/09/2024  Last Visit: 06/23/2024  Dx: Rheumatoid arthritis involving multiple sites with positive rheumatoid factor   Current Dose per office note on 06/23/2024: folic acid 1 mg by mouth daily  Okay to refill Folic Acid?

## 2024-11-14 ENCOUNTER — Ambulatory Visit: Payer: Self-pay | Admitting: Internal Medicine

## 2024-11-24 ENCOUNTER — Other Ambulatory Visit: Payer: Self-pay | Admitting: Internal Medicine

## 2024-11-30 NOTE — Progress Notes (Signed)
 "  Office Visit Note  Patient: [name]             Date of Birth: May 21, 1946           MRN: [id number]             PCP: Chrystal Lamarr RAMAN, MD Referring: Chrystal Lamarr RAMAN, MD Visit Date: 12/09/2024 Occupation: Data Unavailable  Subjective:  Medication management  History of Present Illness: [name] is a 79 y.o. female with seropositive rheumatoid arthritis and ILD.  She returns today after her last visit in August 2025.  She is on methotrexate  6 tablets weekly along with folic acid 1 mg daily and Plaquenil  200 mg Monday to Friday.  She denies any interruption in the treatment.  She states that she developed an upper respiratory tract infection right before Christmas which lingered on until recently.  She developed a productive cough.  She did not require any antibiotics.  She states she did not discontinue methotrexate  during that duration.  She is feeling better now.  She reports some stiffness.  She denies any joint swelling.    Activities of Daily Living:  Patient reports morning stiffness for a few minutes.   Patient Denies nocturnal pain.  Difficulty dressing/grooming: Denies Difficulty climbing stairs: Denies Difficulty getting out of chair: Denies Difficulty using hands for taps, buttons, cutlery, and/or writing: Reports  Review of Systems  Constitutional:  Positive for fatigue.  HENT:  Positive for mouth dryness. Negative for mouth sores.   Eyes:  Negative for dryness.  Respiratory:  Positive for shortness of breath.   Cardiovascular:  Negative for chest pain and palpitations.  Gastrointestinal:  Negative for blood in stool, constipation and diarrhea.  Endocrine: Negative for increased urination.  Genitourinary:  Negative for involuntary urination.  Musculoskeletal:  Positive for myalgias, muscle weakness, morning stiffness, muscle tenderness and myalgias. Negative for joint pain, gait problem, joint pain and joint swelling.  Skin:  Negative for  color change, rash, hair loss and sensitivity to sunlight.  Allergic/Immunologic: Positive for susceptible to infections.  Neurological:  Negative for dizziness and headaches.  Hematological:  Negative for swollen glands.  Psychiatric/Behavioral:  Positive for depressed mood and sleep disturbance. The patient is nervous/anxious.     PMFS History:  Patient Active Problem List   Diagnosis Date Noted   ILD (interstitial lung disease) (HCC) 01/21/2023   Acute respiratory failure with hypoxia (HCC) 02/24/2022   GERD (gastroesophageal reflux disease) 02/24/2022   Elevated troponin 02/24/2022   Bilateral carpal tunnel syndrome 04/14/2018   Cervical spinal stenosis 01/07/2018   Cervical radiculopathy 01/07/2018   Stenosis of left subclavian artery 12/15/2017   History of gastroesophageal reflux (GERD) 06/25/2017   History of bilateral carpal tunnel release 06/25/2017   Former smoker 01/25/2017   Rheumatoid arthritis involving multiple sites with positive rheumatoid factor (HCC) 01/17/2017   High risk medication use 01/17/2017   Primary osteoarthritis of both hands 01/17/2017   Primary osteoarthritis of both feet 01/17/2017   DJD (degenerative joint disease), cervical 01/17/2017   Spondylosis of lumbar region without myelopathy or radiculopathy 01/17/2017   Cardiomyopathy, ischemic 07/07/2016   CAD- S/P LAD thrombectomy 07/04/16 07/07/2016   Pacemaker-MDT BiV placed 07/05/16 07/07/2016   History of rheumatoid arthritis 07/07/2016   Complete heart block (HCC) 07/04/2016   Essential hypertension 07/04/2016   LBBB (left bundle branch block) 07/04/2016   Exostosis 06/18/2011   History of breast cancer 02/16/2010   Vitamin D  deficiency 02/15/2010   HYPERCHOLESTEROLEMIA 02/15/2010   Anxiety  state 02/15/2010   Asthma 02/15/2010   MIGRAINES, HX OF 02/15/2010   MASTECTOMY, LEFT, HX OF 02/15/2010    Past Medical History:  Diagnosis Date   Anxiety    Asthma    Breast cancer (HCC)    left  breast   Carpal tunnel syndrome    COVID-19 06/2023   Depression    Habitual alcohol use    Heart attack (HCC)    Hypertension    Interstitial lung disease (HCC)    Personal history of chemotherapy    Personal history of radiation therapy    RA (rheumatoid arthritis) (HCC)    Spinal stenosis    Vision abnormalities     Family History  Problem Relation Age of Onset   Stroke Mother    Cancer Father        colon?   Asthma Father    Pyelonephritis Sister    Pulmonary fibrosis Sister    Cancer Paternal Aunt    Cancer Paternal Uncle    Asthma Paternal Grandfather    Past Surgical History:  Procedure Laterality Date   BREAST BIOPSY     BREAST LUMPECTOMY  2005   left   CARDIAC CATHETERIZATION N/A 07/04/2016   Procedure: Left Heart Cath and Coronary Angiography;  Surgeon: Victory LELON Sharps, MD;  Location: [facility] INVASIVE CV LAB;  Service: Cardiovascular;  Laterality: N/A;   CARDIAC CATHETERIZATION N/A 07/04/2016   Procedure: Temporary Pacemaker;  Surgeon: Victory LELON Sharps, MD;  Location: [facility] INVASIVE CV LAB;  Service: Cardiovascular;  Laterality: N/A;   CARPAL TUNNEL RELEASE     CATARACT EXTRACTION Bilateral    EP IMPLANTABLE DEVICE N/A 07/05/2016   Procedure: Pacemaker Implant;  Surgeon: Danelle LELON Birmingham, MD;  Location: [facility] INVASIVE CV LAB;  Service: Cardiovascular;  Laterality: N/A;   herniated rectum repair   11/2020   LAMINECTOMY  1966   LYMPHECTOMY  2005   Social History[1] Social History   Social History Narrative   Not on file     Immunization History  Administered Date(s) Administered   Fluad Quad(high Dose 65+) 08/16/2019, 08/05/2022   Fluzone Influenza virus vaccine,trivalent (IIV3), split virus 08/05/2011, 09/15/2012, 08/12/2013   INFLUENZA, HIGH DOSE SEASONAL PF 08/30/2021   Influenza Split 09/10/2007, 09/05/2009   Influenza Whole 08/21/2009, 08/29/2010   Influenza,inj,Quad PF,6+ Mos 08/21/2016, 09/24/2017, 07/27/2018, 08/21/2020   Influenza,inj,quad, With Preservative  08/01/2014, 08/16/2015   PFIZER(Purple Top)SARS-COV-2 Vaccination 01/14/2020, 02/08/2020, 08/08/2020, 04/09/2021   Pfizer(Comirnaty)Fall Seasonal Vaccine 12 years and older 10/03/2023   Pneumococcal Conjugate-13 08/01/2014   Pneumococcal Polysaccharide-23 05/08/2009, 04/23/2022   Td 01/26/2018   Tdap 05/06/2007   Zoster, Live 06/16/2009     Objective: Vital Signs: BP (!) 163/70   Pulse 73   Temp 98 F (36.7 C)   Resp 16   Ht 5' 2 (1.575 m)   Wt 119 lb 6.4 oz (54.2 kg)   BMI 21.84 kg/m    Physical Exam Vitals and nursing note reviewed.  Constitutional:      Appearance: She is well-developed.  HENT:     Head: Normocephalic and atraumatic.  Eyes:     Conjunctiva/sclera: Conjunctivae normal.  Cardiovascular:     Rate and Rhythm: Normal rate and regular rhythm.     Heart sounds: Normal heart sounds.  Pulmonary:     Effort: Pulmonary effort is normal.     Breath sounds: Normal breath sounds.  Abdominal:     General: Bowel sounds are normal.     Palpations: Abdomen is soft.  Musculoskeletal:     Cervical back: Normal range of motion.  Lymphadenopathy:     Cervical: No cervical adenopathy.  Skin:    General: Skin is warm and dry.     Capillary Refill: Capillary refill takes less than 2 seconds.  Neurological:     Mental Status: She is alert and oriented to person, place, and time.  Psychiatric:        Behavior: Behavior normal.      Musculoskeletal Exam: She had limited lateral rotation of the cervical spine.  Thoracic kyphosis was noted without any tenderness.  She had limited range of motion of her lumbar spine.  There was no SI joint tenderness.  Shoulder joint abduction was limited to about 160 degrees.  She had limitation of extension of her right elbow joint without any synovitis.  Left elbow joint was in full range of motion.  Wrist joints, MCPs, PIPs and DIPs were in good range of motion with no synovitis.  PIP and DIP thickening with no synovitis was noted.  Hip  joints and knee joints were in good range of motion without any warmth swelling or effusion.  There was no tenderness over ankles or MTPs.   CDAI Exam: CDAI Score: -- Patient Global: 0 / 100; Provider Global: 0 / 100 Swollen: --; Tender: -- Joint Exam 12/09/2024   No joint exam has been documented for this visit   There is currently no information documented on the homunculus. Go to the Rheumatology activity and complete the homunculus joint exam.  Investigation: No additional findings.  Imaging: No results found.   Recent Labs: Lab Results  Component Value Date   WBC 7.9 10/11/2024   HGB 13.4 10/11/2024   PLT 319 10/11/2024   NA 136 10/11/2024   K 4.1 10/11/2024   CL 99 10/11/2024   CO2 25 10/11/2024   GLUCOSE 79 10/11/2024   BUN 14 10/11/2024   CREATININE 0.64 10/11/2024   BILITOT 0.4 11/08/2024   ALKPHOS 75 11/08/2024   AST 37 11/08/2024   ALT 22 11/08/2024   PROT 6.1 11/08/2024   ALBUMIN 3.7 (L) 11/08/2024   CALCIUM  9.3 10/11/2024   GFRAA 108 12/26/2020    Speciality Comments: PLQ Eye Exam: 05/12/2024 WNL @ [facility] f/u 12  months.   Procedures:  No procedures performed Allergies: Betadine [povidone iodine] and Hydrocodone   Assessment / Plan:     Visit Diagnoses: Rheumatoid arthritis involving multiple sites with positive rheumatoid factor (HCC)-patient denies having a flare of rheumatoid arthritis.  No increased joint pain or joint swelling was reported.  Patient states she has been taking hydroxychloroquine  and methotrexate  on a regular basis without any interruption.  She had recent upper respiratory tract infection and bronchitis which was not treated with antibiotics per patient.  She did not to stop methotrexate  or Plaquenil  during that duration.  High risk medication use - Methotrexate  5 tablets by mouth once weekly, folic acid 1 mg by mouth daily, and hydroxychloroquine  200 mg  daily Monday to Friday. PLQ Eye Exam: 05/12/2024.  The dose of  methotrexate  was reduced to 5 tablets p.o. weekly because of elevation in the LFTs.  She has been doing well and I will continue on the current dose for now.  She was advised to get repeat labs in March and then every 3 months to monitor for drug toxicity.  Annual eye examination was advised to monitor for ocular toxicity.  Information on immunization was placed in the AVS.  She was advised  to hold methotrexate  if she develops an infection resume after the infection resolves.  ILD (interstitial lung disease) (HCC) - Probable early UIP related to underlying RA- HRCT 05/23/2022.Dr. Meade on 10/02/2023--no medication changes.  Chest CT and spirometry-stable. -Patient states Dr. Meade has moved.  Will establish with ILD clinic for recommendations.  Plan: Ambulatory referral to Pulmonology  Arthritis of right acromioclavicular joint-she seems to have some pain and stiffness in her hands.  No synovitis was noted.  She has underlying osteoarthritic changes.  Contracture of right elbow-unchanged with no synovitis.  Medial epicondylitis of right elbow - right medial epicondyle cortisone injection 01/15/24.  Resolved.  Primary osteoarthritis of both hands  Trigger finger, right middle finger-currently not symptomatic.  Primary osteoarthritis of right knee-no warmth swelling or effusion was noted.  Primary osteoarthritis of both feet -fitting shoes were advised.  DDD (degenerative disc disease), cervical-she had limited range of motion of the cervical spine without discomfort or radiculopathy.  Degeneration of intervertebral disc of lumbar region without discogenic back pain or lower extremity pain-she has chronic discomfort which is manageable.  Other medical problems are listed as follows:  History of gastroesophageal reflux (GERD)  History of hyperlipidemia  History of breast cancer  History of anxiety  History of ventricular tachycardia  Former smoker  History of asthma  History of left  bundle branch block  History of hypertension-blood pressure was 143/63.  Repeat blood pressure was 163/70.  She was advised to monitor pressure closely and follow-up with her PCP.  Vitamin D  deficiency  Vitamin B12 deficiency  Other fatigue  Orders: Orders Placed This Encounter  Procedures   Ambulatory referral to Pulmonology   No orders of the defined types were placed in this encounter.    Follow-Up Instructions: Return in about 5 months (around 05/09/2025) for Rheumatoid arthritis, ILD.   Maya Nash, MD  Note - This record has been created using Animal nutritionist.  Chart creation errors have been sought, but may not always  have been located. Such creation errors do not reflect on  the standard of medical care.     [1]  Social History Tobacco Use   Smoking status: Former    Current packs/day: 0.00    Average packs/day: 2.0 packs/day for 25.0 years (50.0 ttl pk-yrs)    Types: Cigarettes    Start date: 98    Quit date: 14    Years since quitting: 31.0    Passive exposure: Past   Smokeless tobacco: Never  Vaping Use   Vaping status: Never Used  Substance Use Topics   Alcohol use: Yes    Alcohol/week: 14.0 standard drinks of alcohol    Types: 14 Cans of beer per week    Comment: 2 DAILY   Drug use: Never   "

## 2024-12-09 ENCOUNTER — Encounter: Payer: Self-pay | Admitting: Rheumatology

## 2024-12-09 ENCOUNTER — Ambulatory Visit: Attending: Rheumatology | Admitting: Rheumatology

## 2024-12-09 VITALS — BP 163/70 | HR 73 | Temp 98.0°F | Resp 16 | Ht 62.0 in | Wt 119.4 lb

## 2024-12-09 DIAGNOSIS — M5030 Other cervical disc degeneration, unspecified cervical region: Secondary | ICD-10-CM

## 2024-12-09 DIAGNOSIS — M19042 Primary osteoarthritis, left hand: Secondary | ICD-10-CM

## 2024-12-09 DIAGNOSIS — Z87891 Personal history of nicotine dependence: Secondary | ICD-10-CM

## 2024-12-09 DIAGNOSIS — M7701 Medial epicondylitis, right elbow: Secondary | ICD-10-CM

## 2024-12-09 DIAGNOSIS — J849 Interstitial pulmonary disease, unspecified: Secondary | ICD-10-CM | POA: Diagnosis not present

## 2024-12-09 DIAGNOSIS — M19011 Primary osteoarthritis, right shoulder: Secondary | ICD-10-CM

## 2024-12-09 DIAGNOSIS — E559 Vitamin D deficiency, unspecified: Secondary | ICD-10-CM

## 2024-12-09 DIAGNOSIS — M65331 Trigger finger, right middle finger: Secondary | ICD-10-CM | POA: Diagnosis not present

## 2024-12-09 DIAGNOSIS — M19071 Primary osteoarthritis, right ankle and foot: Secondary | ICD-10-CM

## 2024-12-09 DIAGNOSIS — M19041 Primary osteoarthritis, right hand: Secondary | ICD-10-CM

## 2024-12-09 DIAGNOSIS — M1711 Unilateral primary osteoarthritis, right knee: Secondary | ICD-10-CM | POA: Diagnosis not present

## 2024-12-09 DIAGNOSIS — Z8679 Personal history of other diseases of the circulatory system: Secondary | ICD-10-CM

## 2024-12-09 DIAGNOSIS — Z853 Personal history of malignant neoplasm of breast: Secondary | ICD-10-CM

## 2024-12-09 DIAGNOSIS — M24521 Contracture, right elbow: Secondary | ICD-10-CM

## 2024-12-09 DIAGNOSIS — M0579 Rheumatoid arthritis with rheumatoid factor of multiple sites without organ or systems involvement: Secondary | ICD-10-CM

## 2024-12-09 DIAGNOSIS — Z8639 Personal history of other endocrine, nutritional and metabolic disease: Secondary | ICD-10-CM

## 2024-12-09 DIAGNOSIS — M7062 Trochanteric bursitis, left hip: Secondary | ICD-10-CM

## 2024-12-09 DIAGNOSIS — Z79899 Other long term (current) drug therapy: Secondary | ICD-10-CM

## 2024-12-09 DIAGNOSIS — E538 Deficiency of other specified B group vitamins: Secondary | ICD-10-CM

## 2024-12-09 DIAGNOSIS — Z8659 Personal history of other mental and behavioral disorders: Secondary | ICD-10-CM

## 2024-12-09 DIAGNOSIS — Z8709 Personal history of other diseases of the respiratory system: Secondary | ICD-10-CM

## 2024-12-09 DIAGNOSIS — M51369 Other intervertebral disc degeneration, lumbar region without mention of lumbar back pain or lower extremity pain: Secondary | ICD-10-CM | POA: Diagnosis not present

## 2024-12-09 DIAGNOSIS — Z8719 Personal history of other diseases of the digestive system: Secondary | ICD-10-CM

## 2024-12-09 DIAGNOSIS — R5383 Other fatigue: Secondary | ICD-10-CM

## 2024-12-09 DIAGNOSIS — M19072 Primary osteoarthritis, left ankle and foot: Secondary | ICD-10-CM

## 2024-12-09 NOTE — Patient Instructions (Addendum)
 Standing Labs We placed an order today for your standing lab work.   Please have your standing labs drawn in  march and every 3 months  Please have your labs drawn 2 weeks prior to your appointment so that the provider can discuss your lab results at your appointment, if possible.  Please note that you may see your imaging and lab results in MyChart before we have reviewed them. We will contact you once all results are reviewed. Please allow our office up to 72 hours to thoroughly review all of the results before contacting the office for clarification of your results.  WALK-IN LAB HOURS  Monday through Thursday from 8:00 am - 4:30 pm and Friday from 8:00 am-12:00 pm.  Patients with office visits requiring labs will be seen before walk-in labs.  You may encounter longer than normal wait times. Please allow additional time. Wait times may be shorter on  Monday and Thursday afternoons.  We do not book appointments for walk-in labs. We appreciate your patience and understanding with our staff.   Labs are drawn by [facility]. Please bring your co-pay at the time of your lab draw.  You may receive a bill from [facility] for your lab work.  Please note if you are on Hydroxychloroquine  and and an order has been placed for a Hydroxychloroquine  level,  you will need to have it drawn 4 hours or more after your last dose.  If you wish to have your labs drawn at another location, please call the office 24 hours in advance so we can fax the orders.  The office is located at [address], [address], [place], [place] [address]   If you have any questions regarding directions or hours of operation,  please call [phone].   As a reminder, please drink plenty of water prior to coming for your lab work. Thanks!   Vaccines You are taking a medication(s) that can suppress your immune system.  The following immunizations are recommended: Flu annually Covid-19  Td/Tdap (tetanus, diphtheria, pertussis) every  10 years Pneumonia (Prevnar 15 then Pneumovax 23 at least 1 year apart.  Alternatively, can take Prevnar 20 without needing additional dose) Shingrix: 2 doses from 4 weeks to 6 months apart  Please check with your PCP to make sure you are up to date.   If you have signs or symptoms of an infection or start antibiotics: First, call your PCP for workup of your infection. Hold your medication through the infection, until you complete your antibiotics, and until symptoms resolve if you take the following: Injectable medication (Actemra, Benlysta, Cimzia, Cosentyx, Enbrel, Humira, Kevzara, Orencia, Remicade, Simponi, Stelara, Taltz, Tremfya) Methotrexate  Leflunomide (Arava) Mycophenolate (Cellcept) Xeljanz, Olumiant, or Rinvoq

## 2024-12-11 ENCOUNTER — Other Ambulatory Visit: Payer: Self-pay | Admitting: Physician Assistant

## 2024-12-11 DIAGNOSIS — R7989 Other specified abnormal findings of blood chemistry: Secondary | ICD-10-CM

## 2024-12-11 DIAGNOSIS — J849 Interstitial pulmonary disease, unspecified: Secondary | ICD-10-CM

## 2024-12-11 DIAGNOSIS — M0579 Rheumatoid arthritis with rheumatoid factor of multiple sites without organ or systems involvement: Secondary | ICD-10-CM

## 2024-12-11 DIAGNOSIS — Z79899 Other long term (current) drug therapy: Secondary | ICD-10-CM

## 2024-12-13 NOTE — Telephone Encounter (Signed)
 Last Fill: 08/30/2024  Labs: 10/11/2024 AST is borderline elevated. ALT WNL. Please clarify if she has been taking any tylenol ? Alcohol use?  Any other medication changes?  Absolute monocytes and eosinophils are borderline elevated, rest of CBC WNL. We will continue to monitor.   Next Visit: 05/12/2025  Last Visit: 12/09/2024  DX:  Rheumatoid arthritis involving multiple sites with positive rheumatoid factor   Current Dose per office note 12/09/2024: Methotrexate  5 tablets by mouth once weekly   Okay to refill Methotrexate ?

## 2024-12-16 ENCOUNTER — Other Ambulatory Visit: Payer: Self-pay | Admitting: Internal Medicine

## 2024-12-17 ENCOUNTER — Telehealth: Payer: Self-pay

## 2024-12-17 MED ORDER — BUDESONIDE-FORMOTEROL FUMARATE 160-4.5 MCG/ACT IN AERO: 2.0000 | INHALATION_SPRAY | Freq: Two times a day (BID) | RESPIRATORY_TRACT | 3 refills | Status: DC

## 2024-12-17 NOTE — Telephone Encounter (Signed)
 Copied from CRM [id number]. Topic: Clinical - Medication Refill >> Dec 15, 2024  9:51 AM Rilla B wrote: Medication: budesonide -formoterol  (SYMBICORT ) 160-4.5 MCG/ACT inhaler  Has the patient contacted their pharmacy? Yes (Agent: If no, request that the patient contact the pharmacy for the refill. If patient does not wish to contact the pharmacy document the reason why and proceed with request.) (Agent: If yes, when and what did the pharmacy advise?)  This is the patient's preferred pharmacy:  [id number] [place], [place] - [address] AT [facility] OF [facility] [address] & [facility] [address] [place] [address] Phone: [phone] Fax: [phone]  Is this the correct pharmacy for this prescription? Yes If no, delete pharmacy and type the correct one.   Has the prescription been filled recently? Yes  Is the patient out of the medication? Yes  Has the patient been seen for an appointment in the last year OR does the patient have an upcoming appointment? Yes  Can we respond through MyChart? Yes  Agent: Please be advised that Rx refills may take up to 3 business days. We ask that you follow-up with your pharmacy. >> Dec 17, 2024  1:45 PM Leila BROCKS wrote: Patient [phone] states is having trouble get refill on budesonide -formoterol  (SYMBICORT ) 160-4.5 MCG/ACT inhaler two puffs twice daily, and has called twice and [facility] pharmacy sent request with no responses. Patient was seeing Dr. Meade, and now has an appointment with Dr. Theophilus 12/23/24 at 11:15 am. Patient is out of medication and asking what can be done? Patient states not having any symptoms right now. Please advise. Unable to reach [facility], tried a few times, please call back.   [id number] [place], [place] - [address] AT [facility] OF [facility] [address] & [facility] [address] [place] [address] Phone: [phone] Fax: [phone]    Received a paper fax for refill on  budesonide -formoterol  (SYMBICORT ) 160-4.5 MCG/ACT inhaler. Dr. Adrien signed.  Faxed in copy as well as sent in electronic approval for medication.  Called patient and gave information.  Patient verbalized understanding.

## 2024-12-17 NOTE — Telephone Encounter (Signed)
 Received refill request from [facility] for Budesonide /Form 160/4.17mcg (120 inh) (generic for Symbicort  160 mcg)  Per Dr. Jovita, OK refill x 3.  Sent in rx and faxed paper copy signed by Dr. Adrien.

## 2024-12-23 ENCOUNTER — Encounter: Payer: Self-pay | Admitting: Pulmonary Disease

## 2024-12-23 ENCOUNTER — Ambulatory Visit: Admitting: Pulmonary Disease

## 2024-12-23 VITALS — BP 138/76 | HR 76 | Ht 62.0 in | Wt 120.0 lb

## 2024-12-23 DIAGNOSIS — J4530 Mild persistent asthma, uncomplicated: Secondary | ICD-10-CM

## 2024-12-23 DIAGNOSIS — M069 Rheumatoid arthritis, unspecified: Secondary | ICD-10-CM

## 2024-12-23 DIAGNOSIS — J45909 Unspecified asthma, uncomplicated: Secondary | ICD-10-CM

## 2024-12-23 DIAGNOSIS — J849 Interstitial pulmonary disease, unspecified: Secondary | ICD-10-CM

## 2024-12-23 MED ORDER — BUDESONIDE-FORMOTEROL FUMARATE 160-4.5 MCG/ACT IN AERO: 2.0000 | INHALATION_SPRAY | Freq: Two times a day (BID) | RESPIRATORY_TRACT | 3 refills | Status: AC

## 2024-12-23 NOTE — Progress Notes (Signed)
 "              [name]    [id number]    1946-07-02  Primary Care Physician:Timberlake, Lamarr RAMAN, MD  Referring Physician: Dolphus Reiter, MD [address] [place],  [place] [address]  Chief complaint: Follow-up for RA-ILD, asthma  HPI: 79 y.o. who  has a past medical history of Anxiety, Asthma, Breast cancer (HCC), Carpal tunnel syndrome, COVID-19 (06/2023), Depression, Habitual alcohol use, Heart attack (HCC), Hypertension, Interstitial lung disease (HCC), Personal history of chemotherapy, Personal history of radiation therapy, RA (rheumatoid arthritis) (HCC), Spinal stenosis, and Vision abnormalities.  Discussed the use of AI scribe software for clinical note transcription with the patient, who gave verbal consent to proceed.  History of Present Illness [name] is an 79 year old female with rheumatoid arthritis and asthma who presents for evaluation of her rheumatoid medication and lung health. She is accompanied by her daughter, [name]. She was referred by Dr. Dolphus for consultation for ILD  Rheumatoid arthritis and immunosuppression - Rheumatoid arthritis for seven years, managed with methotrexate , hydroxychloroquine , and folic acid.  Follows with Dr. 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  I am doing well Exposures: No mold, hot  tub, Jacuzzi.  No feather pillows or comforters No h/o chemo/XRT/amiodarone /macrodantin/MTX  No exposure to asbestos, silica or other organic allergens  Smoking history: Former smoker, quit in 1995 after smoking more than a pack per day Travel history: Originally from [place] .  No significant recent travel Family history: Sister had unspecified pulmonary fibrosis.  Parents had asthma.   Outpatient Encounter Medications as of 12/23/2024  Medication Sig   acetaminophen  (TYLENOL ) 325 MG tablet Take 325-650 mg by mouth every 6 (six) hours as needed for mild pain, headache or fever.   albuterol  (PROVENTIL  HFA;VENTOLIN  HFA) 108 (90 Base) MCG/ACT inhaler Inhale 2 puffs into the lungs every 6 (six) hours as needed for wheezing or shortness of breath.   aspirin  81 MG tablet Take 81 mg by mouth daily.   atorvastatin  (LIPITOR) 40 MG tablet TAKE 1 TABLET(40 MG) BY MOUTH DAILY AT 6 PM   budesonide -formoterol  (SYMBICORT ) 160-4.5 MCG/ACT inhaler Inhale 2 puffs into the lungs 2 (two) times daily.   calcium  carbonate (OS-CAL) 600 MG TABS Take 600 mg by mouth 2 (two) times daily with a meal.   Cholecalciferol  (VITAMIN D3) 50 MCG (2000 UT) TABS Take 2,000 Units by mouth daily.   citalopram  (CELEXA ) 20 MG tablet Take 20 mg by mouth daily.   clonazePAM  (KLONOPIN ) 0.5 MG tablet Take 0.25-0.5 mg by mouth daily as needed for anxiety.   folic acid (FOLVITE) 1 MG tablet TAKE 1 TABLET BY MOUTH DAILY   hydroxychloroquine  (PLAQUENIL ) 200 MG tablet TAKE 1 TABLET BY MOUTH DAILY MONDAY THROUGH FRIDAY AS DIRECTED   losartan -hydrochlorothiazide  (HYZAAR) 50-12.5 MG tablet TAKE 1 TABLET BY MOUTH DAILY   melatonin 3 MG TABS tablet Take 3 mg by mouth at bedtime as needed (  for sleep).   methotrexate  (RHEUMATREX) 2.5 MG tablet Take 5 tablets by mouth 1 time weekly.   nitroGLYCERIN  (NITROSTAT ) 0.4 MG SL tablet Place 1 tablet (0.4 mg total) under the tongue every 5 (five) minutes as needed for chest pain.   pantoprazole   (PROTONIX ) 40 MG tablet TAKE 1 TABLET(40 MG) BY MOUTH DAILY   No facility-administered encounter medications on file as of 12/23/2024.     Physical Exam: Today's Vitals   12/23/24 1115  BP: (!) 146/64  Pulse: 76  TempSrc: Oral  SpO2: 93%  Weight: 120 lb (54.4 kg)  Height: 5' 2 (1.575 m)   Body mass index is 21.95 kg/m.  Physical Exam GEN: No acute distress. CV: Regular rate and rhythm, no murmurs. LUNGS: Clear to auscultation bilaterally, normal respiratory effort. SKIN JOINTS: Warm and dry, no rash.    Data Reviewed: Imaging: High-resolution CT scan 02/07/2023-centrilobular and paraseptal emphysema.  Mild subpleural reticulation with ground glass and traction bronchiolectasis.  May have single layer of honeycombing.  Probable UIP pattern I have reviewed the images personally.  PFTs: 04/21/2024 FVC 1.96 [82%], FEV1 1.51 [85%], F/F77, TLC 4.28 [89%], DLCO 9.12 [51%] Moderate diffusion impairment  Labs:  Assessment and Plan Assessment & Plan Rheumatoid arthritis-associated interstitial lung disease Interstitial lung disease likely secondary to rheumatoid arthritis, with diminished lung function. CT scan and lung function tests indicate stability, but increased symptoms warrant further evaluation. Methotrexate  may contribute to lung issues, but current findings suggest RA-ILD as the primary cause. - Ordered chest CT scan to assess current status of interstitial lung disease. - Scheduled lung function test to evaluate lung function. - Will coordinate with rheumatologist Dr. Dolphus for potential medication adjustments if changes are noted.  Asthma Well-controlled with Symbicort . Recent lapse in medication due to pharmacy issues, but symptoms improved upon resuming Symbicort . - Continue Symbicort  as prescribed. - Sent prescription for Symbicort  to [facility] at [address] for a year's supply.  Recommendations: Continue Symbicort  High-res CT, PFTs  Lonna Coder MD [facility] 12/23/2024, 11:26 AM  CC: Dolphus Reiter, MD   "

## 2024-12-23 NOTE — Patient Instructions (Signed)
" °  VISIT SUMMARY: Today, we evaluated your rheumatoid arthritis medication and lung health. We discussed your recent joint pain, respiratory infections, and asthma management.  YOUR PLAN: RHEUMATOID ARTHRITIS-ASSOCIATED INTERSTITIAL LUNG DISEASE: You have interstitial lung disease likely related to your rheumatoid arthritis, which is affecting your lung function. -We have ordered a chest CT scan to assess the current status of your interstitial lung disease. -We have scheduled a lung function test to evaluate your lung function. -We will coordinate with your rheumatologist, Dr. Dolphus, for potential medication adjustments if any changes are noted.  ASTHMA: Your asthma is well-controlled with Symbicort , although you experienced worsened breathing due to a recent lapse in medication. -Continue taking Symbicort  as prescribed. -We have sent a prescription for Symbicort  to [facility] at [address] for a year's supply.    Contains text generated by [employer].   "

## 2024-12-30 ENCOUNTER — Ambulatory Visit: Admitting: Student in an Organized Health Care Education/Training Program

## 2025-01-06 ENCOUNTER — Other Ambulatory Visit

## 2025-03-24 ENCOUNTER — Ambulatory Visit: Admitting: Pulmonary Disease

## 2025-03-24 ENCOUNTER — Encounter

## 2025-05-12 ENCOUNTER — Ambulatory Visit: Admitting: Rheumatology
